# Patient Record
Sex: Female | Born: 1950 | Hispanic: No | Smoking: Never smoker
Health system: Southern US, Community
[De-identification: ages and names within clinical notes are randomized; demographics above are authoritative.]

## PROBLEM LIST (undated history)

## (undated) DIAGNOSIS — F329 Major depressive disorder, single episode, unspecified: Principal | ICD-10-CM

## (undated) DIAGNOSIS — Z923 Personal history of irradiation: Secondary | ICD-10-CM

## (undated) DIAGNOSIS — C719 Malignant neoplasm of brain, unspecified: Secondary | ICD-10-CM

## (undated) DIAGNOSIS — F419 Anxiety disorder, unspecified: Principal | ICD-10-CM

## (undated) DIAGNOSIS — R519 Headache, unspecified: Secondary | ICD-10-CM

## (undated) DIAGNOSIS — R011 Cardiac murmur, unspecified: Secondary | ICD-10-CM

## (undated) DIAGNOSIS — K219 Gastro-esophageal reflux disease without esophagitis: Secondary | ICD-10-CM

## (undated) DIAGNOSIS — J302 Other seasonal allergic rhinitis: Secondary | ICD-10-CM

## (undated) DIAGNOSIS — G609 Hereditary and idiopathic neuropathy, unspecified: Principal | ICD-10-CM

## (undated) DIAGNOSIS — M1990 Unspecified osteoarthritis, unspecified site: Secondary | ICD-10-CM

## (undated) DIAGNOSIS — R51 Headache: Secondary | ICD-10-CM

## (undated) DIAGNOSIS — D649 Anemia, unspecified: Secondary | ICD-10-CM

## (undated) DIAGNOSIS — F32A Depression, unspecified: Secondary | ICD-10-CM

## (undated) DIAGNOSIS — C50919 Malignant neoplasm of unspecified site of unspecified female breast: Secondary | ICD-10-CM

## (undated) HISTORY — PX: CATARACT EXTRACTION: SUR2

## (undated) HISTORY — DX: Hereditary and idiopathic neuropathy, unspecified: G60.9

## (undated) HISTORY — PX: ABDOMINAL HYSTERECTOMY: SHX81

## (undated) HISTORY — PX: BREAST SURGERY: SHX581

## (undated) HISTORY — DX: Anxiety disorder, unspecified: F41.9

## (undated) HISTORY — DX: Malignant neoplasm of brain, unspecified: C71.9

## (undated) HISTORY — DX: Anemia, unspecified: D64.9

## (undated) HISTORY — PX: BREAST EXCISIONAL BIOPSY: SUR124

## (undated) HISTORY — DX: Major depressive disorder, single episode, unspecified: F32.9

---
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
## 2000-06-14 ENCOUNTER — Encounter: Payer: Self-pay | Admitting: Ophthalmology

## 2000-06-15 ENCOUNTER — Ambulatory Visit (HOSPITAL_COMMUNITY): Admission: RE | Admit: 2000-06-15 | Discharge: 2000-06-15 | Payer: Self-pay | Admitting: Ophthalmology

## 2000-08-28 ENCOUNTER — Ambulatory Visit (HOSPITAL_COMMUNITY): Admission: RE | Admit: 2000-08-28 | Discharge: 2000-08-28 | Payer: Self-pay | Admitting: Ophthalmology

## 2005-10-24 HISTORY — PX: BREAST LUMPECTOMY: SHX2

## 2006-02-15 ENCOUNTER — Encounter (INDEPENDENT_AMBULATORY_CARE_PROVIDER_SITE_OTHER): Payer: Self-pay | Admitting: Specialist

## 2006-02-15 ENCOUNTER — Encounter: Admission: RE | Admit: 2006-02-15 | Discharge: 2006-02-15 | Payer: Self-pay | Admitting: Family Medicine

## 2006-06-14 ENCOUNTER — Encounter: Admission: RE | Admit: 2006-06-14 | Discharge: 2006-06-14 | Payer: Self-pay | Admitting: *Deleted

## 2006-06-14 ENCOUNTER — Ambulatory Visit (HOSPITAL_COMMUNITY): Admission: RE | Admit: 2006-06-14 | Discharge: 2006-06-14 | Payer: Self-pay | Admitting: *Deleted

## 2006-06-14 ENCOUNTER — Encounter (INDEPENDENT_AMBULATORY_CARE_PROVIDER_SITE_OTHER): Payer: Self-pay | Admitting: Specialist

## 2007-03-05 ENCOUNTER — Encounter: Admission: RE | Admit: 2007-03-05 | Discharge: 2007-03-05 | Payer: Self-pay | Admitting: *Deleted

## 2007-03-29 ENCOUNTER — Emergency Department (HOSPITAL_COMMUNITY): Admission: EM | Admit: 2007-03-29 | Discharge: 2007-03-29 | Payer: Self-pay | Admitting: Emergency Medicine

## 2007-03-30 ENCOUNTER — Emergency Department (HOSPITAL_COMMUNITY): Admission: EM | Admit: 2007-03-30 | Discharge: 2007-03-30 | Payer: Self-pay | Admitting: Emergency Medicine

## 2007-04-01 ENCOUNTER — Emergency Department (HOSPITAL_COMMUNITY): Admission: EM | Admit: 2007-04-01 | Discharge: 2007-04-01 | Payer: Self-pay | Admitting: Emergency Medicine

## 2008-03-27 ENCOUNTER — Encounter: Admission: RE | Admit: 2008-03-27 | Discharge: 2008-03-27 | Payer: Self-pay | Admitting: *Deleted

## 2009-04-16 ENCOUNTER — Encounter: Admission: RE | Admit: 2009-04-16 | Discharge: 2009-04-16 | Payer: Self-pay | Admitting: *Deleted

## 2010-11-14 ENCOUNTER — Encounter: Payer: Self-pay | Admitting: *Deleted

## 2010-11-14 ENCOUNTER — Encounter: Payer: Self-pay | Admitting: Family Medicine

## 2010-11-15 ENCOUNTER — Encounter: Payer: Self-pay | Admitting: *Deleted

## 2011-03-11 NOTE — Op Note (Signed)
Amy Roman, Amy Roman               ACCOUNT NO.:  [id number]   MEDICAL RECORD NO.:  [id number]          PATIENT TYPE:  AMB   LOCATION:  SDS                          FACILITY:  [facility]   PHYSICIAN:  Alfonse Ras, MD   DATE OF BIRTH:  [birth year]   DATE OF PROCEDURE:  06/14/2006  DATE OF DISCHARGE:  06/14/2006                                 OPERATIVE REPORT   PREOPERATIVE DIAGNOSIS:  1. Abnormal right mammogram.  2. History of papilloma.   POSTOPERATIVE DIAGNOSIS:  1. Abnormal right mammogram.  2. History of papilloma.   PROCEDURE:  Needle localization right breast biopsy.   SURGEON:  Alfonse Ras, MD   ANESTHESIA:  Local MAC.   DESCRIPTION:  The patient was taken to the operating room and placed in  supine position after adequate MAC anesthesia was induced.  Using IV  sedation, the patient refused to remove her pants prior to sedation.  She  was blocked then with some towels to protect her from her zipper.  The area  in the right upper outer quadrant of the breast was anesthetized using 1%  lidocaine local anesthesia.  I made a curvilinear incision around the wire,  and dissected down, excising all tissue around the wire and had obvious  papillomatous tissue excised.   The patient had significant amount of bleeding in the cavity which was  controlled with Bovie electrocautery and Surgicel.  Skin was closed with  subcuticular 4-0 Monocryl.  Steri-Strips and sterile dressings were applied.  The patient tolerated the procedure well and went to PACU in good condition.  I am awaiting a call back on the specimen mammography.      Alfonse Ras, MD  Electronically Signed     KRE/MEDQ  D:  06/14/2006  T:  06/14/2006  Job:  [id number]

## 2014-05-14 ENCOUNTER — Encounter: Payer: Self-pay | Admitting: Internal Medicine

## 2014-05-14 ENCOUNTER — Ambulatory Visit (INDEPENDENT_AMBULATORY_CARE_PROVIDER_SITE_OTHER): Payer: Medicaid Other | Admitting: Internal Medicine

## 2014-05-14 VITALS — BP 122/72 | HR 74 | Temp 98.3°F | Wt 219.0 lb

## 2014-05-14 DIAGNOSIS — G609 Hereditary and idiopathic neuropathy, unspecified: Secondary | ICD-10-CM

## 2014-05-14 DIAGNOSIS — E559 Vitamin D deficiency, unspecified: Secondary | ICD-10-CM

## 2014-05-14 HISTORY — DX: Hereditary and idiopathic neuropathy, unspecified: G60.9

## 2014-05-14 MED ORDER — GABAPENTIN 300 MG PO CAPS: ORAL_CAPSULE | ORAL | Status: DC

## 2014-05-14 NOTE — Patient Instructions (Signed)
Get your blood work before you leave   Start taking a medication called gabapentin 300 mg: Take one tablet at bedtime for one week Then take one tablet twice a day for one week Then  take one tablet 3 times a day    Next visit is for routine check up in 3 weeks

## 2014-05-14 NOTE — Progress Notes (Signed)
Subjective:    Patient ID: Amy Roman, female    DOB: 1951-08-23, 63 y.o.   MRN: [id number]  DOS:  05/14/2014 Type of visit - description:  New pt, here w/ her son History: Several years history of leg pain, approximately since 2006: The pain is described as a burning, numbness, a spasm from the knees down bilaterally to the toes. OTC ibuprofen and Tylenol doesn't help. Apparently, no previous workup has been done. Because of the pain, she has been unable to sleep and feels somehow depressed. Also complained of back and bilateral hip pain mostly when she sits for prolonged periods of time or when she walks more than 1 block.   ROS Denies fever chills, has headaches which are at baseline. Denies chest pain, difficulty breathing. No edema but the "my legs got big back in 2006". She admits to poor vision, the problem is developing gradually since   after had cataract  surgery few years ago. No nausea, vomiting, diarrhea.     Past Medical History  Diagnosis Date  . Unspecified hereditary and idiopathic peripheral neuropathy 05/14/2014    Past Surgical History  Procedure Laterality Date  . Cataract extraction Bilateral   . Breast surgery      bx, no cancer     History   Social History  . Marital Status: Married    Spouse Name: N/A    Number of Children: 4  . Years of Education: N/A   Occupational History  . house     Social History Main Topics  . Smoking status: Never Smoker   . Smokeless tobacco: Never Used  . Alcohol Use: No  . Drug Use: Not on file  . Sexual Activity: Not on file   Other Topics Concern  . Not on file   Social History Narrative   Original from [place]   Moved to the [place] 2000   Widow      Family History  Problem Relation Age of Onset  . Colon cancer Neg Hx   . Breast cancer Neg Hx   . Diabetes Mother        Medication List       This list is accurate as of: 05/14/14 11:59 PM.  Always use your most recent med list.               ACETAMINOPHEN-CODEINE PO  Take by mouth.     gabapentin 300 MG capsule  Commonly known as:  NEURONTIN  Take  one tablet at bedtime for one week, then one tablet twice a day for one week, then one tablet 3 times a day     MOTRIN PO  Take by mouth.           Objective:   Physical Exam BP 122/72  Pulse 74  Temp(Src) 98.3 F (36.8 C)  Wt 219 lb (99.338 kg)  SpO2 98% General -- alert, well-developed, NAD.  Neck --no thyromegaly , normal carotid pulse   Lungs -- normal respiratory effort, no intercostal retractions, no accessory muscle use, and normal breath sounds.  Heart-- normal rate, regular rhythm, no murmur.  Abdomen-- Not distended, good bowel sounds,soft, non-tender. Extremities-- legs are large but no pitting or firm edema, some hyperpigmentation at pretibial skin, no redness Normal pedal and femoral pulses  . Knees w/o redness or effusion (although exam is limited d/t pt habitus) Neurologic--  alert & oriented X3. Speech normal, gait appropriate for age, strength symmetric and appropriate for age.  DTRs  decreased but symmetric.  Psych-- Cognition and judgment appear intact. Cooperative with normal attention span and concentration. No anxious or depressed appearing.        Assessment & Plan:

## 2014-05-14 NOTE — Assessment & Plan Note (Addendum)
8 years history of leg pain, symptoms consistent with a neuropathy. Other considerations are neurological claudication, Venous insufficiency.  Vascular insuf  is less likely as she has a  normal vascular exam. At this point I like to clarify the etiology of her symptoms, several labs will be ordered, see orders. We'll start Neurontin gradually for symptomatic relief. Will refer to neurology, likely will need a nerve conduction study.

## 2014-05-14 NOTE — Progress Notes (Signed)
Pre visit review using our clinic review tool, if applicable. No additional management support is needed unless otherwise documented below in the visit note. 

## 2014-05-15 LAB — TSH: TSH: 3.19 u[IU]/mL (ref 0.35–4.50)

## 2014-05-15 LAB — COMPREHENSIVE METABOLIC PANEL
ALK PHOS: 86 U/L (ref 39–117)
ALT: 18 U/L (ref 0–35)
AST: 24 U/L (ref 0–37)
Albumin: 3.7 g/dL (ref 3.5–5.2)
BILIRUBIN TOTAL: 0.5 mg/dL (ref 0.2–1.2)
BUN: 16 mg/dL (ref 6–23)
CO2: 25 meq/L (ref 19–32)
CREATININE: 0.8 mg/dL (ref 0.4–1.2)
Calcium: 9.2 mg/dL (ref 8.4–10.5)
Chloride: 107 mEq/L (ref 96–112)
GFR: 72.75 mL/min (ref >60.00)
Glucose, Bld: 128 mg/dL — ABNORMAL HIGH (ref 70–99)
Potassium: 3.8 mEq/L (ref 3.5–5.1)
SODIUM: 137 meq/L (ref 135–145)
TOTAL PROTEIN: 7 g/dL (ref 6.0–8.3)

## 2014-05-15 LAB — CBC WITH DIFFERENTIAL/PLATELET
BASOS ABS: 0 10*3/uL (ref 0.0–0.1)
Basophils Relative: 0.8 % (ref 0.0–3.0)
Eosinophils Absolute: 0.1 10*3/uL (ref 0.0–0.7)
Eosinophils Relative: 3 % (ref 0.0–5.0)
HEMATOCRIT: 39.8 % (ref 36.0–46.0)
HEMOGLOBIN: 13.4 g/dL (ref 12.0–15.0)
LYMPHS ABS: 1.6 10*3/uL (ref 0.7–4.0)
Lymphocytes Relative: 32.1 % (ref 12.0–46.0)
MCHC: 33.8 g/dL (ref 30.0–36.0)
MCV: 91.1 fl (ref 78.0–100.0)
MONO ABS: 0.3 10*3/uL (ref 0.1–1.0)
MONOS PCT: 6.2 % (ref 3.0–12.0)
NEUTROS ABS: 2.8 10*3/uL (ref 1.4–7.7)
Neutrophils Relative %: 57.9 % (ref 43.0–77.0)
PLATELETS: 282 10*3/uL (ref 150.0–400.0)
RBC: 4.36 Mil/uL (ref 3.87–5.11)
RDW: 13.6 % (ref 11.5–15.5)
WBC: 4.9 10*3/uL (ref 4.0–10.5)

## 2014-05-15 LAB — CK: CK TOTAL: 89 U/L (ref 7–177)

## 2014-05-15 LAB — VITAMIN B12: Vitamin B-12: 196 pg/mL — ABNORMAL LOW (ref 211–911)

## 2014-05-15 LAB — FERRITIN: Ferritin: 45.5 ng/mL (ref 10.0–291.0)

## 2014-05-15 LAB — IRON: IRON: 81 ug/dL (ref 42–145)

## 2014-05-15 LAB — HEMOGLOBIN A1C: Hgb A1c MFr Bld: 5.9 % (ref 4.6–6.5)

## 2014-05-15 LAB — FOLATE: FOLATE: 15.5 ng/mL (ref >5.9)

## 2014-05-15 LAB — VITAMIN D 25 HYDROXY (VIT D DEFICIENCY, FRACTURES): VITD: 12.17 ng/mL — AB (ref 30.00–100.00)

## 2014-05-20 MED ORDER — VITAMIN D (ERGOCALCIFEROL) 1.25 MG (50000 UNIT) PO CAPS: 50000.0000 [IU] | ORAL_CAPSULE | ORAL | Status: DC

## 2014-05-20 NOTE — Addendum Note (Signed)
Addended by: Peggyann Shoals on: 05/20/2014 08:49 AM   Modules accepted: Orders

## 2014-05-27 ENCOUNTER — Ambulatory Visit (INDEPENDENT_AMBULATORY_CARE_PROVIDER_SITE_OTHER): Payer: Medicaid Other | Admitting: *Deleted

## 2014-05-27 DIAGNOSIS — E559 Vitamin D deficiency, unspecified: Secondary | ICD-10-CM

## 2014-05-27 DIAGNOSIS — E538 Deficiency of other specified B group vitamins: Secondary | ICD-10-CM

## 2014-05-27 MED ORDER — CYANOCOBALAMIN 1000 MCG/ML IJ SOLN
1000.0000 ug | Freq: Once | INTRAMUSCULAR | Status: AC
  Administered 2014-05-27: 1000 ug via INTRAMUSCULAR

## 2014-05-27 NOTE — Progress Notes (Signed)
Pt came in for her 1st weekly Vit B12 injection.  Pt tolerated injection well.  Next injection in 1 week, and pt will schedule an appt.//AB/CMA

## 2014-06-02 ENCOUNTER — Ambulatory Visit (INDEPENDENT_AMBULATORY_CARE_PROVIDER_SITE_OTHER): Payer: Medicaid Other | Admitting: Internal Medicine

## 2014-06-02 ENCOUNTER — Encounter: Payer: Self-pay | Admitting: Internal Medicine

## 2014-06-02 VITALS — BP 116/76 | HR 71 | Temp 98.2°F | Wt 214.0 lb

## 2014-06-02 DIAGNOSIS — E559 Vitamin D deficiency, unspecified: Secondary | ICD-10-CM

## 2014-06-02 DIAGNOSIS — E538 Deficiency of other specified B group vitamins: Secondary | ICD-10-CM

## 2014-06-02 DIAGNOSIS — G609 Hereditary and idiopathic neuropathy, unspecified: Secondary | ICD-10-CM

## 2014-06-02 MED ORDER — VITAMIN D (ERGOCALCIFEROL) 1.25 MG (50000 UNIT) PO CAPS: 50000.0000 [IU] | ORAL_CAPSULE | ORAL | Status: DC

## 2014-06-02 MED ORDER — CYANOCOBALAMIN 1000 MCG/ML IJ SOLN
1000.0000 ug | Freq: Once | INTRAMUSCULAR | Status: AC
  Administered 2014-06-02: 1000 ug via INTRAMUSCULAR

## 2014-06-02 NOTE — Assessment & Plan Note (Signed)
Labs show a low B12 vitamin D. Plan: See neurology as recommended Replace vitamins Declined to take more neurontin than 300 mg twice a day. Reassess in 6 months

## 2014-06-02 NOTE — Assessment & Plan Note (Signed)
On ergocalciferol

## 2014-06-02 NOTE — Progress Notes (Signed)
   Subjective:    Patient ID: Amy Roman, female    DOB: [birth year], 63 y.o.   MRN: [id number]  DOS:  06/02/2014 Type of visit - description: Followup from previous visit History: Was diagnosed with neuropathy, labs show low vitamin B12 and vitamin D. Status post a B12 shot, due for another shot today. she misunderstood the instructions for ergocalciferol and took one one tablet daily for 4 days. She is taking gabapentin, one tablet twice a day, afraid to take more because she thinks may get drowsy, symptoms have improved to some extent.  ROS Denies nausea, vomiting, excessive somnolence. No headaches.   Past Medical History  Diagnosis Date  . Unspecified hereditary and idiopathic peripheral neuropathy 05/14/2014    Past Surgical History  Procedure Laterality Date  . Cataract extraction Bilateral   . Breast surgery      bx, no cancer     History   Social History  . Marital Status: Married    Spouse Name: N/A    Number of Children: 4  . Years of Education: N/A   Occupational History  . house     Social History Main Topics  . Smoking status: Never Smoker   . Smokeless tobacco: Never Used  . Alcohol Use: No  . Drug Use: Not on file  . Sexual Activity: Not on file   Other Topics Concern  . Not on file   Social History Narrative   Original from [place]   Moved to the [place] 2000   Widow         Medication List       This list is accurate as of: 06/02/14  5:07 PM.  Always use your most recent med list.               gabapentin 300 MG capsule  Commonly known as:  NEURONTIN  Take 300 mg by mouth 2 (two) times daily.     MOTRIN PO  Take by mouth.     Vitamin D (Ergocalciferol) 50000 UNITS Caps capsule  Commonly known as:  DRISDOL  Take 1 capsule (50,000 Units total) by mouth every 7 (seven) days.           Objective:   Physical Exam BP 116/76  Pulse 71  Temp(Src) 98.2 F (36.8 C)  Wt 214 lb (97.07 kg)  SpO2 98% General -- alert,  well-developed, NAD.  Neurologic--  alert & oriented X3. Speech normal, gait appropriate for age, strength symmetric and appropriate for age.  Psych-- Cognition and judgment appear intact. Cooperative with normal attention span and concentration. No anxious or depressed appearing.     Assessment & Plan:

## 2014-06-02 NOTE — Patient Instructions (Signed)
Take gabapentin one tablet twice a day  Take ergocalciferol 50,000 units one tablet  once a week only.  Come back in one week for your next vitamin B12 shot  See the specialist as recommended  Next visit in 3 months

## 2014-06-02 NOTE — Progress Notes (Signed)
Pre visit review using our clinic review tool, if applicable. No additional management support is needed unless otherwise documented below in the visit note. 

## 2014-06-02 NOTE — Assessment & Plan Note (Addendum)
To get her second shot today, to come back for additional shots as recommended

## 2014-06-10 ENCOUNTER — Ambulatory Visit (INDEPENDENT_AMBULATORY_CARE_PROVIDER_SITE_OTHER): Payer: Medicaid Other | Admitting: General Practice

## 2014-06-10 VITALS — Temp 98.0°F

## 2014-06-10 DIAGNOSIS — E538 Deficiency of other specified B group vitamins: Secondary | ICD-10-CM

## 2014-06-10 MED ORDER — CYANOCOBALAMIN 1000 MCG/ML IJ SOLN
1000.0000 ug | Freq: Once | INTRAMUSCULAR | Status: AC
  Administered 2014-06-10: 1000 ug via INTRAMUSCULAR

## 2014-06-17 ENCOUNTER — Ambulatory Visit (INDEPENDENT_AMBULATORY_CARE_PROVIDER_SITE_OTHER): Payer: Medicaid Other | Admitting: *Deleted

## 2014-06-17 DIAGNOSIS — E538 Deficiency of other specified B group vitamins: Secondary | ICD-10-CM

## 2014-06-17 MED ORDER — CYANOCOBALAMIN 1000 MCG/ML IJ SOLN
1000.0000 ug | Freq: Once | INTRAMUSCULAR | Status: AC
  Administered 2014-06-17: 1000 ug via INTRAMUSCULAR

## 2014-07-01 ENCOUNTER — Ambulatory Visit (INDEPENDENT_AMBULATORY_CARE_PROVIDER_SITE_OTHER): Payer: Medicaid Other | Admitting: Neurology

## 2014-07-01 ENCOUNTER — Encounter: Payer: Self-pay | Admitting: Neurology

## 2014-07-01 VITALS — BP 110/74 | HR 72 | Ht 61.02 in | Wt 215.5 lb

## 2014-07-01 DIAGNOSIS — IMO0002 Reserved for concepts with insufficient information to code with codable children: Secondary | ICD-10-CM

## 2014-07-01 DIAGNOSIS — G4762 Sleep related leg cramps: Secondary | ICD-10-CM

## 2014-07-01 DIAGNOSIS — M5416 Radiculopathy, lumbar region: Secondary | ICD-10-CM

## 2014-07-01 DIAGNOSIS — E538 Deficiency of other specified B group vitamins: Secondary | ICD-10-CM

## 2014-07-01 DIAGNOSIS — G609 Hereditary and idiopathic neuropathy, unspecified: Secondary | ICD-10-CM

## 2014-07-01 MED ORDER — PREGABALIN 75 MG PO CAPS: 75.0000 mg | ORAL_CAPSULE | Freq: Two times a day (BID) | ORAL | Status: DC

## 2014-07-01 NOTE — Progress Notes (Signed)
[facility] Clinic Note - Initial Visit   Date: 07/01/2014  Amy Roman MRN: [id number] DOB: 1951/01/10   Dear Dr. Larose Kells:   Thank you for your kind referral of Amy Roman for consultation of bilateral feet paresthesias. Although her history is well known to you, please allow [place] to reiterate it for the purpose of our medical record. The patient was accompanied to the clinic by son who also provides collateral information.     History of Present Illness: Amy Roman is a 63 y.o. right-handed [place] female with history of benign right breast mass resection, vitamin B12 deficiency, lymphedema, and bilateral cataract presenting for evaluation of bilateral feet pain.    Starting around 2006, she developed numbness and burning pain especially from the knees down.  She tried using an ibuprofen ointment which helped.  Pain is constant and worse with prolonged standing or walking for greater than 10-15 min.  She has a lot of low back pain with radiating pain into the left leg. She was recently started on gabapentin 300mg  TID which improved pain slightly, but did not completely alleviate.  Although it was recommended to further titrate, patient reluctant to increase the dose and requesting trial of Lyrica instead, reporting that she is very sensitive to medication but denies any specific side effects related to gabapentin.  Of note, part of her neuropathy work-up did show vitamin B12 and vitamin D deficiency for which she is being supplemented.  Out-side paper records, electronic medical record, and images have been reviewed where available and summarized as:  Labs 05/14/2014:  Vitamin B12 196*, HbA1c 5.9, TSH 3.19, ferritin 45, CK 89, vitamin D 14* .   Past Medical History  Diagnosis Date  . Unspecified hereditary and idiopathic peripheral neuropathy 05/14/2014    Past Surgical History  Procedure Laterality Date  . Cataract extraction  Bilateral   . Breast surgery      bx, no cancer      Medications:  Current Outpatient Prescriptions on File Prior to Visit  Medication Sig Dispense Refill  . gabapentin (NEURONTIN) 300 MG capsule Take 300 mg by mouth 2 (two) times daily.      . Ibuprofen (MOTRIN PO) Take by mouth.      . Vitamin D, Ergocalciferol, (DRISDOL) 50000 UNITS CAPS capsule Take 1 capsule (50,000 Units total) by mouth every 7 (seven) days.  4 capsule  3   No current facility-administered medications on file prior to visit.    Allergies: No Known Allergies  Family History: Family History  Problem Relation Age of Onset  . Colon cancer Neg Hx   . Breast cancer Neg Hx   . Diabetes Mother   . Neuropathy Sister   . Neuropathy Mother   . Other Father     Social History: History   Social History  . Marital Status: Married    Spouse Name: N/A    Number of Children: 4  . Years of Education: N/A   Occupational History  . house     Social History Main Topics  . Smoking status: Never Smoker   . Smokeless tobacco: Never Used  . Alcohol Use: No  . Drug Use: No  . Sexual Activity: Not on file   Other Topics Concern  . Not on file   Social History Narrative   Original from [place]   Moved to the [place] 2000   Widow    She lives with son.  She had four sons.  Review of Systems:  CONSTITUTIONAL: No fevers, chills, night sweats, or weight loss.   EYES: No visual changes or eye pain ENT: No hearing changes.  No history of nose bleeds.   RESPIRATORY: No cough, wheezing and shortness of breath.   CARDIOVASCULAR: Negative for chest pain, and palpitations.   GI: Negative for abdominal discomfort, blood in stools or black stools.  No recent change in bowel habits.   GU:  No history of incontinence.   MUSCLOSKELETAL: +history of joint pain or swelling.  No myalgias.   SKIN: Negative for lesions, rash, and itching.   HEMATOLOGY/ONCOLOGY: Negative for prolonged bleeding, bruising easily,  and swollen nodes.  No history of cancer.   ENDOCRINE: Negative for cold or heat intolerance, polydipsia or goiter.   PSYCH:  +depression or anxiety symptoms.   NEURO: As Above.   Vital Signs:  BP 110/74  Pulse 72  Ht 5' 1.02" (1.55 m)  Wt 215 lb 8 oz (97.75 kg)  BMI 40.69 kg/m2  SpO2 95%  General Medical Exam:   General:  Well appearing, comfortable.   Eyes/ENT: see cranial nerve examination.   Neck: No masses appreciated.  Full range of motion without tenderness.  No carotid bruits. Respiratory:  Clear to auscultation, good air entry bilaterally.   Cardiac:  Regular rate and rhythm, no murmur.    Extremities:  Marked lymphedema of bilateral legs Skin:  Skin color, texture, turgor normal. No rashes or lesions.  Neurological Exam: MENTAL STATUS including orientation to time, place, person, recent and remote memory, attention span and concentration, language, and fund of knowledge is normal.  Speech is not dysarthric.  CRANIAL NERVES: II:  No visual field defects.  Unremarkable fundi.   III-IV-VI: Pupils equal round and reactive to light.  Normal conjugate, extra-ocular eye movements in all directions of gaze.  No nystagmus.  No ptosis.   V:  Normal facial sensation.  VII:  Normal facial symmetry and movements.   VIII:  Normal hearing and vestibular function.   IX-X:  Normal palatal movement.   XI:  Normal shoulder shrug and head rotation.   XII:  Normal tongue strength and range of motion, no deviation or fasciculation.  MOTOR:  Prominent lower leg edema bilaterally.  No atrophy, fasciculations or abnormal movements.  No pronator drift.  Tone is normal.    Right Upper Extremity:    Left Upper Extremity:    Deltoid  5/5   Deltoid  5/5   Biceps  5/5   Biceps  5/5   Triceps  5/5   Triceps  5/5   Wrist extensors  5/5   Wrist extensors  5/5   Wrist flexors  5/5   Wrist flexors  5/5   Finger extensors  5/5   Finger extensors  5/5   Finger flexors  5/5   Finger flexors  5/5     Dorsal interossei  5/5   Dorsal interossei  5/5   Abductor pollicis  5/5   Abductor pollicis  5/5   Tone (Ashworth scale)  0  Tone (Ashworth scale)  0   Right Lower Extremity:    Left Lower Extremity:    Hip flexors  5/5   Hip flexors  5/5   Hip extensors  5/5   Hip extensors  5/5   Knee flexors  5/5   Knee flexors  5/5   Knee extensors  5/5   Knee extensors  5/5   Dorsiflexors  5/5   Dorsiflexors  5/5  Plantarflexors  5/5   Plantarflexors  5/5   Toe extensors  5/5   Toe extensors  5/5   Toe flexors  5/5   Toe flexors  5/5   Tone (Ashworth scale)  0  Tone (Ashworth scale)  0   MSRs:  Right                                                                 Left brachioradialis 2+  brachioradialis 2+  biceps 2+  biceps 2+  triceps 2+  triceps 2+  patellar 1+  patellar 1+  ankle jerk 0  ankle jerk 0  Hoffman no  Hoffman no  plantar response down  plantar response down   SENSORY:  Pin prick is reduced in the legs bilaterally.  Vibration and light touch intact throughout. Romberg's sign absent.   COORDINATION/GAIT: Normal finger-to- nose-finger.  Intact rapid alternating movements bilaterally.  Able to rise from a chair without using arms.  Gait wide-based due to body habitus, stable.    IMPRESSION: 1.  Peripheral neuropathy with predominant small fiber involvement.  Neuropathy labs notable for vitamin B12 deficiency  2.  Lumbar spondylosis with radiculopathy also likely based on history 3.  Significant bilateral leg lymphedema would hamper accurate testing of NCS/EMG, so will hold off for now.  If no improvement, consider imaging of the back to look for foraminal stenosis which may be contributing to radicular symptoms 4.  Vitamin B12 deficiency   PLAN/RECOMMENDATIONS:  1.  Start lyrica 75mg  twice daily, uptitrate as needed 2.  Stop taking gabapentin, as patient does not wish to further titrate, despite no adverse effects 3.  Continue monthly vitamin B12 injections 4.  Consider  adding muscle relaxant going forward for noctural leg cramps 5.  Return to clinic in 82-months   The duration of this appointment visit was 40 minutes of face-to-face time with the patient.  Greater than 50% of this time was spent in counseling, explanation of diagnosis, planning of further management, and coordination of care.   Thank you for allowing me to participate in patient's care.  If I can answer any additional questions, I would be pleased to do so.    Sincerely,    Donika K. Posey Pronto, DO

## 2014-07-01 NOTE — Patient Instructions (Addendum)
1.  Start Lyrica 75mg  twice daily 2.  Stop taking gabapentin 3.  Continue vitamin B12 injections 4.  Return to clinic as needed

## 2014-07-02 ENCOUNTER — Telehealth: Payer: Self-pay | Admitting: *Deleted

## 2014-07-02 ENCOUNTER — Other Ambulatory Visit: Payer: Self-pay | Admitting: *Deleted

## 2014-07-02 MED ORDER — NORTRIPTYLINE HCL 10 MG PO CAPS
10.0000 mg | ORAL_CAPSULE | Freq: Every day | ORAL | Status: DC
Start: 2014-07-02 — End: 2014-10-15

## 2014-07-02 NOTE — Telephone Encounter (Signed)
Called son and informed him that insurance will not cover the Lyrica until patient has tried 2 other medications prior.  He agreed to have mother try Nortriptyline 10 mg qhs.  He requested 2 month supply.  Rx sent.

## 2014-07-25 ENCOUNTER — Ambulatory Visit: Payer: Medicaid Other | Admitting: Internal Medicine

## 2014-10-15 ENCOUNTER — Ambulatory Visit (INDEPENDENT_AMBULATORY_CARE_PROVIDER_SITE_OTHER): Payer: Medicaid Other | Admitting: Internal Medicine

## 2014-10-15 ENCOUNTER — Encounter: Payer: Self-pay | Admitting: Internal Medicine

## 2014-10-15 VITALS — BP 124/62 | HR 73 | Temp 97.3°F | Wt 209.4 lb

## 2014-10-15 DIAGNOSIS — G609 Hereditary and idiopathic neuropathy, unspecified: Secondary | ICD-10-CM

## 2014-10-15 DIAGNOSIS — E559 Vitamin D deficiency, unspecified: Secondary | ICD-10-CM

## 2014-10-15 DIAGNOSIS — E538 Deficiency of other specified B group vitamins: Secondary | ICD-10-CM

## 2014-10-15 DIAGNOSIS — F329 Major depressive disorder, single episode, unspecified: Secondary | ICD-10-CM

## 2014-10-15 DIAGNOSIS — F32A Depression, unspecified: Secondary | ICD-10-CM

## 2014-10-15 DIAGNOSIS — F418 Other specified anxiety disorders: Secondary | ICD-10-CM

## 2014-10-15 DIAGNOSIS — F419 Anxiety disorder, unspecified: Secondary | ICD-10-CM

## 2014-10-15 HISTORY — DX: Anxiety disorder, unspecified: F41.9

## 2014-10-15 HISTORY — DX: Depression, unspecified: F32.A

## 2014-10-15 MED ORDER — GABAPENTIN 300 MG PO CAPS: 300.0000 mg | ORAL_CAPSULE | Freq: Three times a day (TID) | ORAL | Status: DC

## 2014-10-15 MED ORDER — ESCITALOPRAM OXALATE 10 MG PO TABS: 10.0000 mg | ORAL_TABLET | Freq: Every day | ORAL | Status: DC

## 2014-10-15 MED ORDER — CYANOCOBALAMIN 1000 MCG/ML IJ SOLN
1000.0000 ug | Freq: Once | INTRAMUSCULAR | Status: AC
  Administered 2014-10-15: 1000 ug via INTRAMUSCULAR

## 2014-10-15 NOTE — Progress Notes (Signed)
Pre visit review using our clinic review tool, if applicable. No additional management support is needed unless otherwise documented below in the visit note. 

## 2014-10-15 NOTE — Progress Notes (Signed)
   Subjective:    Patient ID: Amy Roman, female    DOB: 1950/11/19, 63 y.o.   MRN: [id number]  DOS:  10/15/2014 Type of visit - description : f/u Interval history: Neuropathy, saw neurology, note reviewed, see assessment and plan B12 deficiency, got 2 B12 shots, then [place] and could not keep getting the injections. Vitamin D deficiency, status post weekly replacement. Not taking any supplements now Today her main concern is depression, states he is "severely depressed", a son has been missing for 2 years, 2 weeks ago she find out that he is in [facility] , he is homeless and   has not been able to contact him.   ROS Denies any suicidal ideas  Past Medical History  Diagnosis Date  . Unspecified hereditary and idiopathic peripheral neuropathy 05/14/2014  . Anxiety and depression 10/15/2014    Past Surgical History  Procedure Laterality Date  . Cataract extraction Bilateral   . Breast surgery      bx, no cancer     History   Social History  . Marital Status: Married    Spouse Name: N/A    Number of Children: 4  . Years of Education: N/A   Occupational History  . house     Social History Main Topics  . Smoking status: Never Smoker   . Smokeless tobacco: Never Used  . Alcohol Use: No  . Drug Use: No  . Sexual Activity: Not on file   Other Topics Concern  . Not on file   Social History Narrative   Original from [place]   Moved to the [place] 2000   Widow    She lives with son.  She had four sons.              Medication List       This list is accurate as of: 10/15/14 11:59 PM.  Always use your most recent med list.               escitalopram 10 MG tablet  Commonly known as:  LEXAPRO  Take 1 tablet (10 mg total) by mouth daily.     gabapentin 300 MG capsule  Commonly known as:  NEURONTIN  Take 1 capsule (300 mg total) by mouth 3 (three) times daily.     MOTRIN PO  Take by mouth.     VITAMIN B-12 IJ  Inject as  directed.           Objective:   Physical Exam BP 124/62 mmHg  Pulse 73  Temp(Src) 97.3 F (36.3 C) (Oral)  Wt 209 lb 6 oz (94.972 kg)  SpO2 99% General -- alert, well-developed, NAD.  Extremities--large lower extremities but no pitting edema    Neurologic--  alert & oriented X3. Speech normal, gait appropriate for age, strength symmetric and appropriate for age.  Psych-- Cognition and judgment appear intact. Cooperative with normal attention span and concentration.she is tearful, anxious (moderately)     Assessment & Plan:   Today , I spent more than  25  min with the patient: >50% of the time counseling regards depression, pros-cons of meds, s/e (including suicidality)

## 2014-10-15 NOTE — Assessment & Plan Note (Signed)
Status post ergocalciferol, recommend to take OTCs, recheck on return to the office

## 2014-10-15 NOTE — Assessment & Plan Note (Addendum)
Got  approximately two B12 injections, then she [facility] Plan: Injection today, repeating one week, then monthly

## 2014-10-15 NOTE — Assessment & Plan Note (Signed)
Status post evaluation by neurology, they agree with a diagnosis of neuropathy, she also has some lumbar spondylosis and lymphedema. They decided not to pursue a nerve conduction study due to the lymphedema. They suggested Lyrica but he was very expensive then suggested nortriptyline. The patient ended up not taking any of that medication and she is on Neurontin -up to 3 times a day- she is satisfied with the treatment consequently we won't  change. Refill meds.

## 2014-10-15 NOTE — Assessment & Plan Note (Signed)
New problem, related to family issues (see history of present illness). The patient is not suicidal, we talk about possible treatment with medication and she is agreeable. Plan: Lexapro, recommend to see one of our counselors, information provided. Return to the office 4 weeks. Warned about suicidal ideas

## 2014-10-15 NOTE — Patient Instructions (Signed)
Take gabapentin   one tablet 3 times a day  Start  a new medication Lexapro 1 tablet at bedtime If you get more depressed or you think about suicide, stop the medication and call us  Take OTC vitamin D supplements, between 1000 and 2000 units daily  Come back to the office in one week for another B12 injection  Next office visit with me in one month

## 2014-10-27 ENCOUNTER — Ambulatory Visit: Payer: Medicaid Other | Admitting: Internal Medicine

## 2014-11-18 ENCOUNTER — Ambulatory Visit: Payer: Medicaid Other | Admitting: Internal Medicine

## 2014-11-21 ENCOUNTER — Ambulatory Visit: Payer: Medicaid Other | Admitting: Internal Medicine

## 2014-11-21 ENCOUNTER — Telehealth: Payer: Self-pay | Admitting: *Deleted

## 2014-11-21 NOTE — Telephone Encounter (Signed)
Pt did not show for appointment 11/21/14 at 2:45pm for 1 month follow up.  Charge no show fee?

## 2014-11-24 ENCOUNTER — Encounter: Payer: Self-pay | Admitting: Internal Medicine

## 2014-11-24 NOTE — Telephone Encounter (Signed)
No is ok, send a letter ---->  rec to reschedule

## 2014-11-24 NOTE — Telephone Encounter (Signed)
See note below for no show fee.  No show letter mailed 11/24/14.

## 2015-02-17 ENCOUNTER — Telehealth: Payer: Self-pay | Admitting: Internal Medicine

## 2015-02-17 NOTE — Telephone Encounter (Signed)
Caller name: Bassy A. Relation to pt: self Call back number: [phone] or call son Pharmacy: [id number] - [place], [place]  Reason for call: Pt called requesting rx for gabapentin (NEURONTIN) 300 MG capsule, ASAP. Please advise.

## 2015-02-17 NOTE — Telephone Encounter (Signed)
Gabapentin refills were sent on 10/15/2014 for 6 months to [facility] as requested. Pt should have enough to get him until 04/16/2015. Pt will need to call pharmacy.

## 2015-02-24 ENCOUNTER — Other Ambulatory Visit: Payer: Self-pay

## 2015-02-25 ENCOUNTER — Ambulatory Visit: Payer: Medicaid Other | Admitting: Internal Medicine

## 2015-03-11 ENCOUNTER — Ambulatory Visit (INDEPENDENT_AMBULATORY_CARE_PROVIDER_SITE_OTHER): Payer: Medicaid Other | Admitting: Internal Medicine

## 2015-03-11 ENCOUNTER — Encounter: Payer: Self-pay | Admitting: Internal Medicine

## 2015-03-11 VITALS — BP 128/78 | HR 64 | Temp 97.6°F | Ht 61.0 in | Wt 212.5 lb

## 2015-03-11 DIAGNOSIS — F329 Major depressive disorder, single episode, unspecified: Secondary | ICD-10-CM

## 2015-03-11 DIAGNOSIS — F418 Other specified anxiety disorders: Secondary | ICD-10-CM | POA: Diagnosis not present

## 2015-03-11 DIAGNOSIS — E559 Vitamin D deficiency, unspecified: Secondary | ICD-10-CM

## 2015-03-11 DIAGNOSIS — Z0000 Encounter for general adult medical examination without abnormal findings: Secondary | ICD-10-CM

## 2015-03-11 DIAGNOSIS — R7309 Other abnormal glucose: Secondary | ICD-10-CM | POA: Diagnosis not present

## 2015-03-11 DIAGNOSIS — F419 Anxiety disorder, unspecified: Secondary | ICD-10-CM

## 2015-03-11 DIAGNOSIS — E538 Deficiency of other specified B group vitamins: Secondary | ICD-10-CM | POA: Diagnosis not present

## 2015-03-11 DIAGNOSIS — R7303 Prediabetes: Secondary | ICD-10-CM

## 2015-03-11 MED ORDER — ESCITALOPRAM OXALATE 10 MG PO TABS: 10.0000 mg | ORAL_TABLET | Freq: Every day | ORAL | Status: DC

## 2015-03-11 MED ORDER — GABAPENTIN 300 MG PO CAPS: 300.0000 mg | ORAL_CAPSULE | Freq: Three times a day (TID) | ORAL | Status: DC

## 2015-03-11 MED ORDER — CYANOCOBALAMIN 1000 MCG/ML IJ SOLN
1000.0000 ug | Freq: Once | INTRAMUSCULAR | Status: AC
  Administered 2015-03-11: 1000 ug via INTRAMUSCULAR

## 2015-03-11 NOTE — Progress Notes (Signed)
Subjective:    Patient ID: Amy Roman, female    DOB: 05/11/1951, 64 y.o.   MRN: [id number]  DOS:  03/11/2015 Type of visit - description : rov Interval history:  Patient is a 64 year old female with a history of anxiety/depression, vitamin B12 deficiency, and vitamin D deficiency in today for routine medical care.  Anxiety/Depression: Patient has been on Lexapro for the past five months, depression related to family issues. She denies any n/v, suicidal ideation, or vision changes. She does note difficulty sleeping but states that she has previously prescribed medication which helps with insomnia. She cannot recall the name of this medication.   Neuropathy: She is currently taking Gabapentin 3x/day for neuropathy and notes good relief with medication. Denies any side effects from medication. Currently no pain in legs, just mild occasional itching.  B12 deficiency: Patient has missed previous three injections. Notes no fatigue.   Osteoarthritis: Patient notes pain with ambulation in both knees and in lower back. Currently pain is well-managed with Tylenol prn.     Review of Systems  Constitutional: No fever or chills. Cardiovascular: No CP or palpitations GI: no nausea, vomiting Musculoskeletal: Lower back and bilateral knee pain Neurological: No headaches or double vision. Psychiatry: As per HPI  Past Medical History  Diagnosis Date  . Unspecified hereditary and idiopathic peripheral neuropathy 05/14/2014  . Anxiety and depression 10/15/2014    Past Surgical History  Procedure Laterality Date  . Cataract extraction Bilateral   . Breast surgery      bx, no cancer     History   Social History  . Marital Status: Married    Spouse Name: N/A  . Number of Children: 4  . Years of Education: N/A   Occupational History  . house     Social History Main Topics  . Smoking status: Never Smoker   . Smokeless tobacco: Never Used  . Alcohol Use: No  . Drug Use: No    . Sexual Activity: Not on file   Other Topics Concern  . Not on file   Social History Narrative   Original from [place], [place]   Moved to the [place] 2000   Widow    She lives with son.  She had four sons.           Family History  Problem Relation Age of Onset  . Colon cancer Neg Hx   . Breast cancer Neg Hx   . Diabetes Mother   . Neuropathy Sister   . Neuropathy Mother   . Other Father        Medication List       This list is accurate as of: 03/11/15  7:57 PM.  Always use your most recent med list.               escitalopram 10 MG tablet  Commonly known as:  LEXAPRO  Take 1 tablet (10 mg total) by mouth daily.     gabapentin 300 MG capsule  Commonly known as:  NEURONTIN  Take 1 capsule (300 mg total) by mouth 3 (three) times daily.     MOTRIN PO  Take by mouth.     VITAMIN B-12 IJ  Inject as directed.           Objective:   Physical Exam BP 128/78 mmHg  Pulse 64  Temp(Src) 97.6 F (36.4 C) (Oral)  Ht 5\' 1"  (1.549 m)  Wt 212 lb 8 oz (96.389 kg)  BMI 40.17  kg/m2  SpO2 95%  General:   Well developed, well nourished . NAD.  HEENT:  Normocephalic . Face symmetric, atraumatic Lungs:  CTA B Normal respiratory effort, no intercostal retractions, no accessory muscle use. Heart: RRR,  no murmur.  Skin: Not pale. Not jaundice Musculoskeletal: Bilateral nonpitting lower extremity edema Neurologic:  alert & oriented X3.  Speech normal, gait slow for age and unassisted Psych--  Cognition and judgment appear intact.  Cooperative with normal attention span and concentration.  Behavior appropriate. No anxious or depressed appearing.       Assessment & Plan:   (Patient seen along with   Aletta Edouard, medical student)  Depression Currently well-controlled with Lexapro 1 tablet daily, patient denies any suicidal ideation.  Plan: Continue Lexapro as presently prescribed.  Patient instructed to let us know if depression worsens.  Due to  insurance, patient will be switching providers to her Medicaid-appointed provider. Will call with the name of the insomnia medication if she ever needs a refill for possible RF  Neuropathy Currently well-controlled with Gabapentin. No changes to medications. Patient instructed to let us know if neuropathic pain worsens.  B12 deficiency:  Patient missed recent B12 injections, denies any fatigue.  Patient received B12 injection today in office. Will re-check B12 level.  Vitamin D: Will re-check Vitamin D level, patient instructed to continue OTC supplementation.  Osteoarthritis:  Pain currently managed with Tylenol prn. Continue current regimen.  Prediabetes: Previous A1c was 5.9. Will re-check A1c today to evaluate.    Gyn: Refer to gynecologist for pap smear , rx mammogram.

## 2015-03-11 NOTE — Progress Notes (Signed)
Pre visit review using our clinic review tool, if applicable. No additional management support is needed unless otherwise documented below in the visit note. 

## 2015-03-11 NOTE — Patient Instructions (Signed)
Get your blood work before you leave    Come back to the office in 6 months   for a physical exam (or see the Medicaid doctor assigned to you) Please schedule an appointment at the front desk    Come back fasting

## 2015-03-12 LAB — VITAMIN B12

## 2015-03-12 LAB — HEMOGLOBIN A1C: Hgb A1c MFr Bld: 5.6 % (ref 4.6–6.5)

## 2015-03-13 LAB — VITAMIN D 1,25 DIHYDROXY
VITAMIN D 1, 25 (OH) TOTAL: 75 pg/mL — AB (ref 18–72)
VITAMIN D3 1, 25 (OH): 31 pg/mL
Vitamin D2 1, 25 (OH)2: 44 pg/mL

## 2015-07-02 ENCOUNTER — Ambulatory Visit
Admission: RE | Admit: 2015-07-02 | Discharge: 2015-07-02 | Disposition: A | Discharge status: Home / Self Care | Payer: Medicaid Other | Source: Ambulatory Visit | Attending: Internal Medicine | Admitting: Internal Medicine

## 2015-07-02 DIAGNOSIS — Z0000 Encounter for general adult medical examination without abnormal findings: Secondary | ICD-10-CM

## 2015-07-07 ENCOUNTER — Other Ambulatory Visit: Payer: Self-pay | Admitting: Internal Medicine

## 2015-07-07 DIAGNOSIS — R928 Other abnormal and inconclusive findings on diagnostic imaging of breast: Secondary | ICD-10-CM

## 2015-07-13 ENCOUNTER — Ambulatory Visit
Admission: RE | Admit: 2015-07-13 | Discharge: 2015-07-13 | Disposition: A | Discharge status: Home / Self Care | Payer: Medicaid Other | Source: Ambulatory Visit | Attending: Internal Medicine | Admitting: Internal Medicine

## 2015-07-13 DIAGNOSIS — R928 Other abnormal and inconclusive findings on diagnostic imaging of breast: Secondary | ICD-10-CM

## 2015-12-28 ENCOUNTER — Other Ambulatory Visit: Payer: Self-pay | Admitting: Internal Medicine

## 2016-07-14 ENCOUNTER — Other Ambulatory Visit: Payer: Self-pay | Admitting: Internal Medicine

## 2016-12-14 DIAGNOSIS — M25569 Pain in unspecified knee: Secondary | ICD-10-CM | POA: Diagnosis not present

## 2016-12-14 DIAGNOSIS — G4700 Insomnia, unspecified: Secondary | ICD-10-CM | POA: Diagnosis not present

## 2017-02-28 DIAGNOSIS — M25569 Pain in unspecified knee: Secondary | ICD-10-CM | POA: Diagnosis not present

## 2017-02-28 DIAGNOSIS — F329 Major depressive disorder, single episode, unspecified: Secondary | ICD-10-CM | POA: Diagnosis not present

## 2017-02-28 DIAGNOSIS — G4700 Insomnia, unspecified: Secondary | ICD-10-CM | POA: Diagnosis not present

## 2017-04-12 DIAGNOSIS — M1711 Unilateral primary osteoarthritis, right knee: Secondary | ICD-10-CM | POA: Diagnosis not present

## 2017-04-12 DIAGNOSIS — M170 Bilateral primary osteoarthritis of knee: Secondary | ICD-10-CM | POA: Diagnosis not present

## 2017-04-12 DIAGNOSIS — M1712 Unilateral primary osteoarthritis, left knee: Secondary | ICD-10-CM | POA: Diagnosis not present

## 2017-04-25 DIAGNOSIS — M1990 Unspecified osteoarthritis, unspecified site: Secondary | ICD-10-CM | POA: Diagnosis not present

## 2017-04-25 DIAGNOSIS — F329 Major depressive disorder, single episode, unspecified: Secondary | ICD-10-CM | POA: Diagnosis not present

## 2017-04-25 DIAGNOSIS — N939 Abnormal uterine and vaginal bleeding, unspecified: Secondary | ICD-10-CM | POA: Diagnosis not present

## 2017-04-25 DIAGNOSIS — R634 Abnormal weight loss: Secondary | ICD-10-CM | POA: Diagnosis not present

## 2017-05-01 ENCOUNTER — Emergency Department (HOSPITAL_COMMUNITY)
Admission: EM | Admit: 2017-05-01 | Discharge: 2017-05-01 | Disposition: A | Discharge status: Home / Self Care | Payer: Medicare Other | Attending: Emergency Medicine | Admitting: Emergency Medicine

## 2017-05-01 ENCOUNTER — Encounter (HOSPITAL_COMMUNITY): Payer: Self-pay | Admitting: Emergency Medicine

## 2017-05-01 DIAGNOSIS — N950 Postmenopausal bleeding: Secondary | ICD-10-CM

## 2017-05-01 DIAGNOSIS — N939 Abnormal uterine and vaginal bleeding, unspecified: Secondary | ICD-10-CM | POA: Diagnosis present

## 2017-05-01 DIAGNOSIS — Z79899 Other long term (current) drug therapy: Secondary | ICD-10-CM | POA: Insufficient documentation

## 2017-05-01 LAB — COMPREHENSIVE METABOLIC PANEL
ALBUMIN: 4.2 g/dL (ref 3.5–5.0)
ALT: 29 U/L (ref 14–54)
ANION GAP: 11 (ref 5–15)
AST: 32 U/L (ref 15–41)
Alkaline Phosphatase: 77 U/L (ref 38–126)
BUN: 26 mg/dL — ABNORMAL HIGH (ref 6–20)
CHLORIDE: 102 mmol/L (ref 101–111)
CO2: 26 mmol/L (ref 22–32)
Calcium: 9.8 mg/dL (ref 8.9–10.3)
Creatinine, Ser: 0.95 mg/dL (ref 0.44–1.00)
GFR calc Af Amer: >60 mL/min (ref >60)
GFR calc non Af Amer: >60 mL/min (ref >60)
GLUCOSE: 102 mg/dL — AB (ref 65–99)
POTASSIUM: 4.1 mmol/L (ref 3.5–5.1)
SODIUM: 139 mmol/L (ref 135–145)
Total Bilirubin: 0.4 mg/dL (ref 0.3–1.2)
Total Protein: 7.6 g/dL (ref 6.5–8.1)

## 2017-05-01 LAB — URINALYSIS, ROUTINE W REFLEX MICROSCOPIC

## 2017-05-01 LAB — CBC
HCT: 38.2 % (ref 36.0–46.0)
HEMOGLOBIN: 13.2 g/dL (ref 12.0–15.0)
MCH: 31.7 pg (ref 26.0–34.0)
MCHC: 34.6 g/dL (ref 30.0–36.0)
MCV: 91.6 fL (ref 78.0–100.0)
Platelets: 228 10*3/uL (ref 150–400)
RBC: 4.17 MIL/uL (ref 3.87–5.11)
RDW: 14 % (ref 11.5–15.5)
WBC: 5.6 10*3/uL (ref 4.0–10.5)

## 2017-05-01 LAB — RAPID HIV SCREEN (HIV 1/2 AB+AG)
HIV 1/2 ANTIBODIES: NONREACTIVE
HIV-1 P24 Antigen - HIV24: NONREACTIVE

## 2017-05-01 LAB — WET PREP, GENITAL
Clue Cells Wet Prep HPF POC: NONE SEEN
SPERM: NONE SEEN
Trich, Wet Prep: NONE SEEN
Yeast Wet Prep HPF POC: NONE SEEN

## 2017-05-01 NOTE — ED Triage Notes (Signed)
Patient states that having vaginal bleeding describing it as dark blood with little clots and wears pad/protection continuously. Patient reports little lower abd pain. Patient states that she had this vaginal bleeding before 3 years ago and usually when she having depression.

## 2017-05-01 NOTE — ED Provider Notes (Signed)
[facility] DEPT Provider Note   CSN: [id number] Arrival date & time: 05/01/17  1010     History   Chief Complaint Chief Complaint  Patient presents with  . Vaginal Bleeding    HPI Amy Roman is a 66 y.o. female presenting with 3 week history vaginal bleeding.  Patient states that she's had spotting intermittently since 2011, however this time the spotting Did not resolve spontaneously, and over the past several weeks, has grown heavier. She now reports she changes a pad twice a day, and uses toilet paper to help for any leakage. Bleeding is described as being filled with clots and bright red. She was evaluated by her primary care physician, and told to follow-up with a 'specialist.' Patient states that appointment was never made, so she is here for answers. She reports some mild suprapubic pain described as feeling like menstrual cramps. She denies fever, chills, chest pain, shortness of breath, nausea, vomiting, urinary symptoms, or abnormal bowel movements. She states she has never been to see an OB/GYN in the past.  HPI  Past Medical History:  Diagnosis Date  . Anxiety and depression 10/15/2014  . Unspecified hereditary and idiopathic peripheral neuropathy 05/14/2014    Patient Active Problem List   Diagnosis Date Noted  . Anxiety and depression 10/15/2014  . Vitamin B 12 deficiency 06/02/2014  . Vitamin D deficiency 06/02/2014  . Hereditary and idiopathic peripheral neuropathy 05/14/2014    Past Surgical History:  Procedure Laterality Date  . BREAST SURGERY     bx, no cancer   . CATARACT EXTRACTION Bilateral     OB History    No data available       Home Medications    Prior to Admission medications   Medication Sig Start Date End Date Taking? Authorizing Provider  Cyanocobalamin (VITAMIN B-12 IJ) Inject as directed.    [provider]  escitalopram (LEXAPRO) 10 MG tablet Take 1 tablet (10 mg total) by mouth daily. 03/11/15   Colon Branch,  MD  gabapentin (NEURONTIN) 300 MG capsule TAKE ONE CAPSULE BY MOUTH THREE TIMES DAILY 12/28/15   Colon Branch, MD  Ibuprofen (MOTRIN PO) Take by mouth.    [provider]    Family History Family History  Problem Relation Age of Onset  . Diabetes Mother   . Neuropathy Mother   . Neuropathy Sister   . Other Father   . Colon cancer Neg Hx   . Breast cancer Neg Hx     Social History Social History  Substance Use Topics  . Smoking status: Never Smoker  . Smokeless tobacco: Never Used  . Alcohol use No     Allergies   Patient has no known allergies.   Review of Systems Review of Systems  Constitutional: Negative for chills and fever.  Gastrointestinal: Positive for abdominal pain. Negative for blood in stool, diarrhea and nausea.  Genitourinary: Positive for vaginal bleeding. Negative for dysuria and frequency.     Physical Exam Updated Vital Signs BP 130/86   Pulse 80   Temp 98.5 F (36.9 C) (Oral)   Resp 16   Wt 88.9 kg (196 lb)   SpO2 100%   BMI 37.03 kg/m   Physical Exam  Constitutional: She is oriented to person, place, and time. She appears well-developed and well-nourished.  HENT:  Head: Normocephalic and atraumatic.  Eyes: Pupils are equal, round, and reactive to light.  Neck: Normal range of motion.  Cardiovascular: Normal rate, regular rhythm, normal  heart sounds and intact distal pulses.   Pulmonary/Chest: Effort normal and breath sounds normal. No respiratory distress. She has no wheezes.  Abdominal: Soft. Bowel sounds are normal. She exhibits no distension. There is no tenderness.  Genitourinary: Rectum normal. Pelvic exam was performed with patient supine. Uterus is not tender. Cervix exhibits no motion tenderness. Right adnexum displays no mass and no tenderness. Left adnexum displays no mass and no tenderness. There is bleeding in the vagina.  Genitourinary Comments: Difficult pelvic exam due to patient's body habitus and arthritis.     Musculoskeletal: Normal range of motion.  Neurological: She is alert and oriented to person, place, and time.  Skin: Skin is warm and dry.  Psychiatric: She has a normal mood and affect.  Nursing note and vitals reviewed.    ED Treatments / Results  Labs (all labs ordered are listed, but only abnormal results are displayed) Labs Reviewed  WET PREP, GENITAL - Abnormal; Notable for the following:       Result Value   WBC, Wet Prep HPF POC MANY (*)    All other components within normal limits  URINALYSIS, ROUTINE W REFLEX MICROSCOPIC - Abnormal; Notable for the following:    Color, Urine BROWN (*)    APPearance TURBID (*)    Glucose, UA   (*)    Value: TEST NOT REPORTED DUE TO COLOR INTERFERENCE OF URINE PIGMENT   Hgb urine dipstick   (*)    Value: TEST NOT REPORTED DUE TO COLOR INTERFERENCE OF URINE PIGMENT   Bilirubin Urine   (*)    Value: TEST NOT REPORTED DUE TO COLOR INTERFERENCE OF URINE PIGMENT   Ketones, ur   (*)    Value: TEST NOT REPORTED DUE TO COLOR INTERFERENCE OF URINE PIGMENT   Protein, ur   (*)    Value: TEST NOT REPORTED DUE TO COLOR INTERFERENCE OF URINE PIGMENT   Nitrite   (*)    Value: TEST NOT REPORTED DUE TO COLOR INTERFERENCE OF URINE PIGMENT   Leukocytes, UA   (*)    Value: TEST NOT REPORTED DUE TO COLOR INTERFERENCE OF URINE PIGMENT   Bacteria, UA FEW (*)    Squamous Epithelial / LPF 6-30 (*)    All other components within normal limits  COMPREHENSIVE METABOLIC PANEL - Abnormal; Notable for the following:    Glucose, Bld 102 (*)    BUN 26 (*)    All other components within normal limits  CBC  RAPID HIV SCREEN (HIV 1/2 AB+AG)  HIV ANTIBODY (ROUTINE TESTING)  GC/CHLAMYDIA PROBE AMP ([facility]) NOT AT [facility]    EKG  EKG Interpretation None       Radiology No results found.  Procedures Procedures (including critical care time)  Medications Ordered in ED Medications - No data to display   Initial Impression / Assessment and Plan / ED  Course  I have reviewed the triage vital signs and the nursing notes.  Pertinent labs & imaging results that were available during my care of the patient were reviewed by me and considered in my medical decision making (see chart for details).     Patient vaginal bleeding 3 wks. Bleeding began as just spotting, but has grown heavier with clots. Patient denies abdominal pain, fever, chills, or sxs of infection. Patient has not been seen by OB/GYN. Will do pelvic exam and test for infection.   GU exam difficult due to patient body habitus and BLE arthritis. Blood noted without clots. No obvious discharge  or masses. Patient without significant tenderness. Will order basic labs, UA, and tests for STDs. Patient will likely need to follow-up with GYN.  Labs and physical exam reassuring. No evidence of UTI this time. Will discharge patient with injections to follow-up with GYN. Patient appears safe for discharge. Return precautions given. Patient states she understands and agrees to plan.   Final Clinical Impressions(s) / ED Diagnoses   Final diagnoses:  Postmenopausal bleeding    New Prescriptions Discharge Medication List as of 05/01/2017  1:50 PM       Franchot Heidelberg, PA-C 05/01/17 1611    Lacretia Leigh, MD 05/02/17 [phone]

## 2017-05-01 NOTE — Discharge Instructions (Signed)
It is very important that you follow up with gynecology. Return to the emergency department you develop fever, chills, worsening abdominal pain, or any new or worsening symptoms.

## 2017-05-02 ENCOUNTER — Inpatient Hospital Stay (HOSPITAL_COMMUNITY)
Admission: AD | Admit: 2017-05-02 | Discharge: 2017-05-02 | Disposition: A | Discharge status: Home / Self Care | Payer: Medicare Other | Source: Ambulatory Visit | Attending: Family Medicine | Admitting: Family Medicine

## 2017-05-02 ENCOUNTER — Other Ambulatory Visit (HOSPITAL_COMMUNITY): Payer: Self-pay | Admitting: Family Medicine

## 2017-05-02 DIAGNOSIS — G609 Hereditary and idiopathic neuropathy, unspecified: Secondary | ICD-10-CM | POA: Diagnosis not present

## 2017-05-02 DIAGNOSIS — N950 Postmenopausal bleeding: Secondary | ICD-10-CM | POA: Insufficient documentation

## 2017-05-02 DIAGNOSIS — Z806 Family history of leukemia: Secondary | ICD-10-CM | POA: Diagnosis not present

## 2017-05-02 DIAGNOSIS — F329 Major depressive disorder, single episode, unspecified: Secondary | ICD-10-CM | POA: Diagnosis not present

## 2017-05-02 DIAGNOSIS — F419 Anxiety disorder, unspecified: Secondary | ICD-10-CM | POA: Diagnosis not present

## 2017-05-02 DIAGNOSIS — N939 Abnormal uterine and vaginal bleeding, unspecified: Secondary | ICD-10-CM | POA: Diagnosis present

## 2017-05-02 DIAGNOSIS — Z79899 Other long term (current) drug therapy: Secondary | ICD-10-CM | POA: Insufficient documentation

## 2017-05-02 LAB — GC/CHLAMYDIA PROBE AMP (~~LOC~~) NOT AT ARMC
CHLAMYDIA, DNA PROBE: NEGATIVE
NEISSERIA GONORRHEA: NEGATIVE

## 2017-05-02 MED ORDER — MEGESTROL ACETATE 40 MG PO TABS: 40.0000 mg | ORAL_TABLET | Freq: Two times a day (BID) | ORAL | 0 refills | Status: DC

## 2017-05-02 NOTE — MAU Provider Note (Signed)
Chief Complaint: Vaginal Bleeding   SUBJECTIVE HPI: Amy Roman is a 66 y.o.  who presents to [facility] reporting VB, postmenopausal.  Patient states she has been having bleeding for 9 days.   Patient states she feels very tired. She states she is changing her pads frequently but not soaking through an entire pad every hour, and denies any large clots of blood. In the ER yesterday, with a workup done that showed a hemoglobin stable at 13.2, and 2 years ago was 13.4. She has no signs of leukocytosis or leukopenia, and her platelets are normal. She states she is healthy aside from severe lymphedema that runs in her family, that also is causing significant peripheral neuropathy in her legs.  She is postmenopausal since age 48, 43 years. She has had a history of postmenopausal bleeding in 2011 and 2015. States she never has had any gynecologic workup for this. She is no family history of uterine cancer, ovarian cancer, breast cancer. She states she has a family history of leukemia, and possible lymphoma based on patient's explanation of her aunt's cancer. Menarche was at age 39. She has been pregnant 3 times with 3 babies. She does not smoke. Risk factors include obesity, postmenopausal bleeding.   Past Medical History:  Diagnosis Date  . Anxiety and depression 10/15/2014  . Unspecified hereditary and idiopathic peripheral neuropathy 05/14/2014   OB History  No data available   Past Surgical History:  Procedure Laterality Date  . BREAST SURGERY     bx, no cancer   . CATARACT EXTRACTION Bilateral    Social History   Social History  . Marital status: Married    Spouse name: N/A  . Number of children: 4  . Years of education: N/A   Occupational History  . house     Social History Main Topics  . Smoking status: Never Smoker  . Smokeless tobacco: Never Used  . Alcohol use No  . Drug use: No  . Sexual activity: Not on file   Other Topics Concern  . Not on file    Social History Narrative   Original from [place], [place]   Moved to the [place] 2000   Widow    She lives with son.  She had four sons.         No current facility-administered medications on file prior to encounter.    Current Outpatient Prescriptions on File Prior to Encounter  Medication Sig Dispense Refill  . Cyanocobalamin (VITAMIN B-12 IJ) Inject as directed.    . escitalopram (LEXAPRO) 10 MG tablet Take 1 tablet (10 mg total) by mouth daily. 30 tablet 6  . gabapentin (NEURONTIN) 300 MG capsule TAKE ONE CAPSULE BY MOUTH THREE TIMES DAILY 90 capsule 0  . Ibuprofen (MOTRIN PO) Take by mouth.     No Known Allergies  I have reviewed the past Medical Hx, Surgical Hx, Social Hx, Allergies and Medications.   REVIEW OF SYSTEMS  A comprehensive ROS was negative except per HPI.   OBJECTIVE Patient Vitals for the past 24 hrs:  BP Temp Temp src Pulse Resp  05/02/17 0955 91/77 99.1 F (37.3 C) Oral 80 16    PHYSICAL EXAM Constitutional: Well-developed, well-nourished obese female in no acute distress.  Cardiovascular: normal rate, rhythm, no murmurs Respiratory: normal rate and effort.  GI: Abd soft, non-tender, non-distended. Pos BS x 4 MS: Extremities nontender, chronic severe lymphedema of bilateral lower extremities, equal. No calf tenderness. Arthritis of joints making pelvic difficult due  to limited ROM of knees. Neurologic: Alert and oriented x 4.  GU: Neg CVAT. SPECULUM EXAM: +Female circumcision, class II. Dark red blood noted in vaginal vault and adherent to cervical os. Cervix clean. No clots. No active pouring out bleeding when bearing down.  BIMANUAL: cervix closed; uterus normal size however limited by body habitus, no adnexal tenderness. No CMT.  LAB RESULTS Results for orders placed or performed during the hospital encounter of 05/01/17 (from the past 24 hour(s))  Wet prep, genital     Status: Abnormal   Collection Time: 05/01/17 12:30 PM  Result Value Ref Range    Yeast Wet Prep HPF POC NONE SEEN NONE SEEN   Trich, Wet Prep NONE SEEN NONE SEEN   Clue Cells Wet Prep HPF POC NONE SEEN NONE SEEN   WBC, Wet Prep HPF POC MANY (A) NONE SEEN   Sperm NONE SEEN   CBC     Status: None   Collection Time: 05/01/17 12:46 PM  Result Value Ref Range   WBC 5.6 4.0 - 10.5 K/uL   RBC 4.17 3.87 - 5.11 MIL/uL   Hemoglobin 13.2 12.0 - 15.0 g/dL   HCT 38.2 36.0 - 46.0 %   MCV 91.6 78.0 - 100.0 fL   MCH 31.7 26.0 - 34.0 pg   MCHC 34.6 30.0 - 36.0 g/dL   RDW 14.0 11.5 - 15.5 %   Platelets 228 150 - 400 K/uL  Rapid HIV screen (HIV 1/2 Ab+Ag) ([facility] Only)     Status: None   Collection Time: 05/01/17 12:46 PM  Result Value Ref Range   HIV-1 P24 Antigen - HIV24 NON REACTIVE NON REACTIVE   HIV 1/2 Antibodies NON REACTIVE NON REACTIVE   Interpretation (HIV Ag Ab)      A non reactive test result means that HIV 1 or HIV 2 antibodies and HIV 1 p24 antigen were not detected in the specimen.  Comprehensive metabolic panel     Status: Abnormal   Collection Time: 05/01/17 12:56 PM  Result Value Ref Range   Sodium 139 135 - 145 mmol/L   Potassium 4.1 3.5 - 5.1 mmol/L   Chloride 102 101 - 111 mmol/L   CO2 26 22 - 32 mmol/L   Glucose, Bld 102 (H) 65 - 99 mg/dL   BUN 26 (H) 6 - 20 mg/dL   Creatinine, Ser 0.95 0.44 - 1.00 mg/dL   Calcium 9.8 8.9 - 10.3 mg/dL   Total Protein 7.6 6.5 - 8.1 g/dL   Albumin 4.2 3.5 - 5.0 g/dL   AST 32 15 - 41 U/L   ALT 29 14 - 54 U/L   Alkaline Phosphatase 77 38 - 126 U/L   Total Bilirubin 0.4 0.3 - 1.2 mg/dL   GFR calc non Af Amer >60 >60 mL/min   GFR calc Af Amer >60 >60 mL/min   Anion gap 11 5 - 15  Urinalysis, Routine w reflex microscopic     Status: Abnormal   Collection Time: 05/01/17  1:10 PM  Result Value Ref Range   Color, Urine BROWN (A) YELLOW   APPearance TURBID (A) CLEAR   Specific Gravity, Urine  1.005 - 1.030    TEST NOT REPORTED DUE TO COLOR INTERFERENCE OF URINE PIGMENT   pH  5.0 - 8.0    TEST NOT REPORTED DUE TO COLOR  INTERFERENCE OF URINE PIGMENT   Glucose, UA (A) NEGATIVE mg/dL    TEST NOT REPORTED DUE TO COLOR INTERFERENCE OF URINE PIGMENT   Hgb  urine dipstick (A) NEGATIVE    TEST NOT REPORTED DUE TO COLOR INTERFERENCE OF URINE PIGMENT   Bilirubin Urine (A) NEGATIVE    TEST NOT REPORTED DUE TO COLOR INTERFERENCE OF URINE PIGMENT   Ketones, ur (A) NEGATIVE mg/dL    TEST NOT REPORTED DUE TO COLOR INTERFERENCE OF URINE PIGMENT   Protein, ur (A) NEGATIVE mg/dL    TEST NOT REPORTED DUE TO COLOR INTERFERENCE OF URINE PIGMENT   Nitrite (A) NEGATIVE    TEST NOT REPORTED DUE TO COLOR INTERFERENCE OF URINE PIGMENT   Leukocytes, UA (A) NEGATIVE    TEST NOT REPORTED DUE TO COLOR INTERFERENCE OF URINE PIGMENT   RBC / HPF TOO NUMEROUS TO COUNT 0 - 5 RBC/hpf   WBC, UA 6-30 0 - 5 WBC/hpf   Bacteria, UA FEW (A) NONE SEEN   Squamous Epithelial / LPF 6-30 (A) NONE SEEN   Mucous PRESENT     IMAGING No results found.  MAU COURSE SSE/SVE Reviewed labs from yesterday's ER visit, no sign of anemia with minimal to moderate bleeding  MDM Plan of care reviewed with patient, including labs and tests ordered and medical treatment. Given no sign of anemia or significant hemorrhaging from the uterus, patient is stable to go home without needing Megace. Bleeding precautions were given. Patient has an appointment already set up with Dr. Ihor Dow on July 25 at 10 AM. We will send patient for outpatient ultrasound prior to her appointment.   ASSESSMENT 1. Postmenopausal bleeding     PLAN Discharge home in stable condition. Appt for EMB and further workup Megace   Allergies as of 05/02/2017   No Known Allergies     Medication List    TAKE these medications   escitalopram 10 MG tablet Commonly known as:  LEXAPRO Take 1 tablet (10 mg total) by mouth daily.   gabapentin 300 MG capsule Commonly known as:  NEURONTIN TAKE ONE CAPSULE BY MOUTH THREE TIMES DAILY   megestrol 40 MG tablet Commonly known as:   MEGACE Take 1 tablet (40 mg total) by mouth 2 (two) times daily.   MOTRIN PO Take by mouth.   VITAMIN B-12 IJ Inject as directed.        Katherine Basset, DO OB Fellow 05/02/2017 11:05 AM

## 2017-05-02 NOTE — MAU Note (Addendum)
Presents to triage wanting meds for bleeding. Pt states was seen downstairs clinic and was told to come up here.   Provider at bs assessing pt.   1143: Discharge instructions given with pt understanding. Pt left unit via ambulatory with family.

## 2017-05-02 NOTE — Discharge Instructions (Signed)
Postmenopausal Bleeding Postmenopausal bleeding is any bleeding after menopause. Menopause is when a woman's period stops. Any type of bleeding after menopause is concerning. It should be checked by your doctor. Any treatment will depend on the cause. Follow these instructions at home: Watch your condition for any changes.  Avoid the use of tampons and douches as told by your doctor.  Change your pads often.  Get regular pelvic exams and Pap tests.  Keep all appointments for tests as told by your doctor.  Contact a doctor if:  Your bleeding lasts for more than 1 week.  You have belly (abdominal) pain.  You have bleeding after sex (intercourse). Get help right away if:  You have a fever, chills, a headache, dizziness, muscle aches, and bleeding.  You have strong pain with bleeding.  You have clumps of blood (blood clots) coming from your vagina.  You have bleeding and need more than 1 pad an hour.  You feel like you are going to pass out (faint). This information is not intended to replace advice given to you by your health care provider. Make sure you discuss any questions you have with your health care provider. Document Released: 07/19/2008 Document Revised: 03/17/2016 Document Reviewed: 05/09/2013 Elsevier Interactive Patient Education  2017 [employer]  

## 2017-05-02 NOTE — MAU Note (Signed)
Urine in lab 

## 2017-05-03 ENCOUNTER — Ambulatory Visit (HOSPITAL_COMMUNITY)
Admission: RE | Admit: 2017-05-03 | Discharge: 2017-05-03 | Disposition: A | Discharge status: Home / Self Care | Payer: Medicare Other | Source: Ambulatory Visit | Attending: Family Medicine | Admitting: Family Medicine

## 2017-05-03 DIAGNOSIS — N950 Postmenopausal bleeding: Secondary | ICD-10-CM | POA: Insufficient documentation

## 2017-05-03 LAB — HIV ANTIBODY (ROUTINE TESTING W REFLEX): HIV SCREEN 4TH GENERATION: NONREACTIVE

## 2017-05-08 ENCOUNTER — Telehealth: Payer: Self-pay | Admitting: Lab

## 2017-05-08 NOTE — Telephone Encounter (Signed)
Patient son Amy Roman called the Ultrasound Department for results. Ultrasound transferred him to the clinic. Once verifying who he was and his mother was, I read to him the Ultrasound results. Patient son wanted more information on why his mother is still bleeding and what could be done. I told him he could talk to the doctor at his mother next appointment on 7/25. He said his mother would have to be seen sooner because he will be going out of town and gone for 2 weeks, so I told him to call the front desk for a cancellation.

## 2017-05-17 ENCOUNTER — Other Ambulatory Visit (HOSPITAL_COMMUNITY)
Admission: RE | Admit: 2017-05-17 | Discharge: 2017-05-17 | Disposition: A | Discharge status: Home / Self Care | Payer: Medicare Other | Source: Ambulatory Visit | Attending: Obstetrics & Gynecology | Admitting: Obstetrics & Gynecology

## 2017-05-17 ENCOUNTER — Ambulatory Visit (INDEPENDENT_AMBULATORY_CARE_PROVIDER_SITE_OTHER): Payer: Medicare Other | Admitting: Obstetrics & Gynecology

## 2017-05-17 VITALS — BP 134/89 | HR 78 | Wt 199.3 lb

## 2017-05-17 DIAGNOSIS — N950 Postmenopausal bleeding: Secondary | ICD-10-CM | POA: Diagnosis present

## 2017-05-17 DIAGNOSIS — N840 Polyp of corpus uteri: Secondary | ICD-10-CM | POA: Diagnosis not present

## 2017-05-17 NOTE — Progress Notes (Signed)
History:  66 y.o. No obstetric history on file. here today for PMPB. Pt was seen in the MAU after 9 days of bleeding. She was referred her for further eval and bx. Her LMP was 66 years of age.   The following portions of the patient's history were reviewed and updated as appropriate: allergies, current medications, past family history, past medical history, past social history, past surgical history and problem list.  Review of Systems:  Pertinent items are noted in HPI.   Objective:  Physical Exam BP 134/89   Pulse 78   Wt 199 lb 4.8 oz (90.4 kg)   BMI 37.66 kg/m    The indications for endometrial biopsy were reviewed.   Risks of the biopsy including cramping, bleeding, infection, uterine perforation, inadequate specimen and need for additional procedures  were discussed. The patient states she understands and agrees to undergo procedure today. Consent was signed. Time out was performed. Urine HCG was negative. A sterile speculum was placed in the patient's vagina and the cervix was prepped with Betadine. A single-toothed tenaculum was placed on the anterior lip of the cervix to stabilize it. The 3 mm pipelle was introduced into the endometrial cavity without difficulty to a depth of 8cm, and a moderate amount of tissue was obtained and sent to pathology. The instruments were removed from the patient's vagina. Minimal bleeding from the cervix was noted. The patient tolerated the procedure well. Routine post-procedure instructions were given to the patient.   Labs and Imaging US Transvaginal Non-ob  Result Date: 05/03/2017 CLINICAL DATA:  66 year old postmenopausal female presenting with postmenopausal bleeding. EXAM: TRANSABDOMINAL AND TRANSVAGINAL ULTRASOUND OF PELVIS TECHNIQUE: Both transabdominal and transvaginal ultrasound examinations of the pelvis were performed. Transabdominal technique was performed for global imaging of the pelvis including uterus, ovaries, adnexal regions, and pelvic  cul-de-sac. It was necessary to proceed with endovaginal exam following the transabdominal exam to visualize the endometrium and adnexa. COMPARISON:  None FINDINGS: Uterus Measurements: 8.0 x 4.3 x 4.4 cm. The uterus is retroverted on the transvaginal portion of the scan. Normal size uterus. No uterine fibroids or other myometrial abnormalities. Endometrium Thickness: 6 mm. No endometrial cavity fluid or focal endometrial mass demonstrated. Right ovary Measurements: 2.7 x 1.5 x 2.1 cm. Normal appearance/no adnexal mass. Left ovary Measurements: 2.4 x 1.2 x 1.8 cm. Normal appearance/no adnexal mass. Other findings No abnormal free fluid. IMPRESSION: 1. Bilayer endometrial thickness 6 mm. In the setting of post-menopausal bleeding, endometrial sampling is indicated to exclude carcinoma. If results are benign, sonohysterogram should be considered for focal lesion work-up. (Ref: Radiological Reasoning: Algorithmic Workup of Abnormal Vaginal Bleeding with Endovaginal Sonography and Sonohysterography. AJR 2008; 762:G31-51). 2. No uterine fibroids. 3. Normal ovaries.  No adnexal masses. Electronically Signed   By: Ilona Sorrel M.D.   On: 05/03/2017 11:01   US Pelvis Complete  Result Date: 05/03/2017 CLINICAL DATA:  66 year old postmenopausal female presenting with postmenopausal bleeding. EXAM: TRANSABDOMINAL AND TRANSVAGINAL ULTRASOUND OF PELVIS TECHNIQUE: Both transabdominal and transvaginal ultrasound examinations of the pelvis were performed. Transabdominal technique was performed for global imaging of the pelvis including uterus, ovaries, adnexal regions, and pelvic cul-de-sac. It was necessary to proceed with endovaginal exam following the transabdominal exam to visualize the endometrium and adnexa. COMPARISON:  None FINDINGS: Uterus Measurements: 8.0 x 4.3 x 4.4 cm. The uterus is retroverted on the transvaginal portion of the scan. Normal size uterus. No uterine fibroids or other myometrial abnormalities.  Endometrium Thickness: 6 mm. No endometrial cavity fluid or  focal endometrial mass demonstrated. Right ovary Measurements: 2.7 x 1.5 x 2.1 cm. Normal appearance/no adnexal mass. Left ovary Measurements: 2.4 x 1.2 x 1.8 cm. Normal appearance/no adnexal mass. Other findings No abnormal free fluid. IMPRESSION: 1. Bilayer endometrial thickness 6 mm. In the setting of post-menopausal bleeding, endometrial sampling is indicated to exclude carcinoma. If results are benign, sonohysterogram should be considered for focal lesion work-up. (Ref: Radiological Reasoning: Algorithmic Workup of Abnormal Vaginal Bleeding with Endovaginal Sonography and Sonohysterography. AJR 2008; 142:L95-32). 2. No uterine fibroids. 3. Normal ovaries.  No adnexal masses. Electronically Signed   By: Ilona Sorrel M.D.   On: 05/03/2017 11:01    Assessment & Plan:  PMPB  F/u endo bx surg path Megace 40mg  daily F/u in 2 weeks or sooner prn The patient will follow up to review the results and for further management.     Viviann Broyles L. Harraway-Smith, M.D., Cherlynn June

## 2017-05-19 ENCOUNTER — Telehealth: Payer: Self-pay | Admitting: Obstetrics & Gynecology

## 2017-05-19 DIAGNOSIS — N950 Postmenopausal bleeding: Secondary | ICD-10-CM

## 2017-05-19 NOTE — Telephone Encounter (Signed)
Patient's husband called to say she needed her Rx that the Dr. said she could have for her bleeding.

## 2017-05-22 DIAGNOSIS — G4700 Insomnia, unspecified: Secondary | ICD-10-CM | POA: Diagnosis not present

## 2017-05-22 DIAGNOSIS — F329 Major depressive disorder, single episode, unspecified: Secondary | ICD-10-CM | POA: Diagnosis not present

## 2017-05-22 DIAGNOSIS — M25569 Pain in unspecified knee: Secondary | ICD-10-CM | POA: Diagnosis not present

## 2017-05-22 DIAGNOSIS — N939 Abnormal uterine and vaginal bleeding, unspecified: Secondary | ICD-10-CM | POA: Diagnosis not present

## 2017-05-23 MED ORDER — MEGESTROL ACETATE 40 MG PO TABS: 40.0000 mg | ORAL_TABLET | Freq: Two times a day (BID) | ORAL | 0 refills | Status: DC

## 2017-05-23 NOTE — Telephone Encounter (Signed)
Returned patient's call using Arabic interpreter "Izora Gala" [phone].Spoke with patient re: need for megace. Patient stated that when she first started bleeding earlier in the month it was heavy like a period. The medication she was prescribed helped and she stopped bleeding. However, since running out of the medication she has resumed bleeding but it is lighter than before. She has an appointment on 8/9. After verifying her pharmacy info I reordered her prescription for megace, patient aware she can pick it up later today. Understanding voiced.

## 2017-05-23 NOTE — Addendum Note (Signed)
Addended by: Riccardo Dubin on: 05/23/2017 08:59 AM   Modules accepted: Orders

## 2017-05-24 ENCOUNTER — Telehealth: Payer: Self-pay | Admitting: *Deleted

## 2017-05-24 NOTE — Telephone Encounter (Signed)
Called patient and left message to call us back regarding results.

## 2017-05-24 NOTE — Telephone Encounter (Signed)
-----   Message from Lavonia Drafts, MD sent at 05/22/2017 10:34 AM EDT ----- Please call pt. (you will need an interpreter).  Her path reports for her Endo bx suggests a polyp.  I recommend Megace 40mg  daily for 3-6 months.   Thx,  clh-S

## 2017-05-31 NOTE — Telephone Encounter (Signed)
Contacted pt with Interpreter Feryal and informed pt that she has a polyp.  I also informed pt that she needs to take her Megace 40 mg po daily per providers recommendation.  Pt stated that because she has been bleeding she has been taking the Megace 40 mg tablet tid and that has helped.  I informed pt that the provider prescribed her to take it twice day on 05/23/17 and that she can notify the provider tomorrow at her appt.  Pt stated understanding with no further questions.

## 2017-06-01 ENCOUNTER — Telehealth: Payer: Self-pay | Admitting: *Deleted

## 2017-06-01 ENCOUNTER — Ambulatory Visit (INDEPENDENT_AMBULATORY_CARE_PROVIDER_SITE_OTHER): Payer: Medicare Other | Admitting: Obstetrics & Gynecology

## 2017-06-01 ENCOUNTER — Encounter: Payer: Self-pay | Admitting: Obstetrics & Gynecology

## 2017-06-01 VITALS — BP 127/60 | HR 67

## 2017-06-01 DIAGNOSIS — N840 Polyp of corpus uteri: Secondary | ICD-10-CM

## 2017-06-01 DIAGNOSIS — N950 Postmenopausal bleeding: Secondary | ICD-10-CM

## 2017-06-01 MED ORDER — MEGESTROL ACETATE 40 MG PO TABS: 40.0000 mg | ORAL_TABLET | Freq: Two times a day (BID) | ORAL | 3 refills | Status: DC

## 2017-06-01 NOTE — Progress Notes (Signed)
History:  66 y.o. PMP pt here today for eval of PMPB.  Pt is on Megace 40mg  bid. Her bx revealed a benign polyp. She reports less bleeding on Megace but, she still reports daily spotting. She denies pain or other sx.   The following portions of the patient's history were reviewed and updated as appropriate: allergies, current medications, past family history, past medical history, past social history, past surgical history and problem list.  Review of Systems:  Pertinent items are noted in HPI.   Objective:  Physical Exam Blood pressure 127/60, pulse 67.  CONSTITUTIONAL: Well-developed, well-nourished female in no acute distress.  HENT:  Normocephalic, atraumatic EYES: Conjunctivae and EOM are normal. No scleral icterus.  NECK: Normal range of motion SKIN: Skin is warm and dry. No rash noted. Not diaphoretic.No pallor. [place]: Alert and oriented to person, place, and time. Normal coordination.  Pelvic: deferred  Labs and Imaging US Transvaginal Non-ob  Result Date: 05/03/2017 CLINICAL DATA:  66 year old postmenopausal female presenting with postmenopausal bleeding. EXAM: TRANSABDOMINAL AND TRANSVAGINAL ULTRASOUND OF PELVIS TECHNIQUE: Both transabdominal and transvaginal ultrasound examinations of the pelvis were performed. Transabdominal technique was performed for global imaging of the pelvis including uterus, ovaries, adnexal regions, and pelvic cul-de-sac. It was necessary to proceed with endovaginal exam following the transabdominal exam to visualize the endometrium and adnexa. COMPARISON:  None FINDINGS: Uterus Measurements: 8.0 x 4.3 x 4.4 cm. The uterus is retroverted on the transvaginal portion of the scan. Normal size uterus. No uterine fibroids or other myometrial abnormalities. Endometrium Thickness: 6 mm. No endometrial cavity fluid or focal endometrial mass demonstrated. Right ovary Measurements: 2.7 x 1.5 x 2.1 cm. Normal appearance/no adnexal mass. Left ovary Measurements:  2.4 x 1.2 x 1.8 cm. Normal appearance/no adnexal mass. Other findings No abnormal free fluid. IMPRESSION: 1. Bilayer endometrial thickness 6 mm. In the setting of post-menopausal bleeding, endometrial sampling is indicated to exclude carcinoma. If results are benign, sonohysterogram should be considered for focal lesion work-up. (Ref: Radiological Reasoning: Algorithmic Workup of Abnormal Vaginal Bleeding with Endovaginal Sonography and Sonohysterography. AJR 2008; 696:E95-28). 2. No uterine fibroids. 3. Normal ovaries.  No adnexal masses. Electronically Signed   By: Ilona Sorrel M.D.   On: 05/03/2017 11:01   US Pelvis Complete  Result Date: 05/03/2017 CLINICAL DATA:  66 year old postmenopausal female presenting with postmenopausal bleeding. EXAM: TRANSABDOMINAL AND TRANSVAGINAL ULTRASOUND OF PELVIS TECHNIQUE: Both transabdominal and transvaginal ultrasound examinations of the pelvis were performed. Transabdominal technique was performed for global imaging of the pelvis including uterus, ovaries, adnexal regions, and pelvic cul-de-sac. It was necessary to proceed with endovaginal exam following the transabdominal exam to visualize the endometrium and adnexa. COMPARISON:  None FINDINGS: Uterus Measurements: 8.0 x 4.3 x 4.4 cm. The uterus is retroverted on the transvaginal portion of the scan. Normal size uterus. No uterine fibroids or other myometrial abnormalities. Endometrium Thickness: 6 mm. No endometrial cavity fluid or focal endometrial mass demonstrated. Right ovary Measurements: 2.7 x 1.5 x 2.1 cm. Normal appearance/no adnexal mass. Left ovary Measurements: 2.4 x 1.2 x 1.8 cm. Normal appearance/no adnexal mass. Other findings No abnormal free fluid. IMPRESSION: 1. Bilayer endometrial thickness 6 mm. In the setting of post-menopausal bleeding, endometrial sampling is indicated to exclude carcinoma. If results are benign, sonohysterogram should be considered for focal lesion work-up. (Ref: Radiological  Reasoning: Algorithmic Workup of Abnormal Vaginal Bleeding with Endovaginal Sonography and Sonohysterography. AJR 2008; 413:K44-01). 2. No uterine fibroids. 3. Normal ovaries.  No adnexal masses. Electronically Signed   By:  Ilona Sorrel M.D.   On: 05/03/2017 11:01   05/17/2017 Diagnosis Endometrium, biopsy - BENIGN ENDOMETRIAL TYPE POLYP.  Assessment & Plan:  PMPB  Polyp on biopsy   Megace 40 bid  Pt offered LnIUD vs hysteroscopy with removal of polyp in hosp or outpt. Pt declines procedure at this time . She would like to ive the meds 3 more months.   F/u in 3 months or sooner prn  Pt speaks and understands some English but, there was a live Arabic interpreter present for the entire visit.   Total face-to-face time with patient was 15 min.  Greater than 50% was spent in counseling and coordination of care with the patient.   Jody Silas L. Harraway-Smith, M.D., Cherlynn June

## 2017-06-01 NOTE — Telephone Encounter (Signed)
Received a fax from [facility] requesting a prior authorization for megace dated 05/24/17. I called [phone] and they state it has been denied. Asked if I wanted to submit an appeal which I stated I did. Information given over the phone and decision will be given in 3 days. I notified [facility] appeal was initiated. They state she paid 05/24/17 for rx as selfpay.

## 2017-06-02 ENCOUNTER — Encounter: Payer: Self-pay | Admitting: Obstetrics & Gynecology

## 2017-07-17 DIAGNOSIS — F329 Major depressive disorder, single episode, unspecified: Secondary | ICD-10-CM | POA: Diagnosis not present

## 2017-07-17 DIAGNOSIS — G4700 Insomnia, unspecified: Secondary | ICD-10-CM | POA: Diagnosis not present

## 2017-07-17 DIAGNOSIS — M25569 Pain in unspecified knee: Secondary | ICD-10-CM | POA: Diagnosis not present

## 2017-07-24 ENCOUNTER — Encounter: Payer: Self-pay | Admitting: Obstetrics & Gynecology

## 2017-07-24 ENCOUNTER — Ambulatory Visit (INDEPENDENT_AMBULATORY_CARE_PROVIDER_SITE_OTHER): Payer: Medicare Other | Admitting: Clinical

## 2017-07-24 ENCOUNTER — Ambulatory Visit (INDEPENDENT_AMBULATORY_CARE_PROVIDER_SITE_OTHER): Payer: Medicare Other | Admitting: Obstetrics & Gynecology

## 2017-07-24 VITALS — BP 144/69 | HR 63 | Ht 59.06 in | Wt 194.0 lb

## 2017-07-24 DIAGNOSIS — F329 Major depressive disorder, single episode, unspecified: Secondary | ICD-10-CM

## 2017-07-24 DIAGNOSIS — F4323 Adjustment disorder with mixed anxiety and depressed mood: Secondary | ICD-10-CM

## 2017-07-24 DIAGNOSIS — F419 Anxiety disorder, unspecified: Secondary | ICD-10-CM

## 2017-07-24 DIAGNOSIS — N950 Postmenopausal bleeding: Secondary | ICD-10-CM | POA: Diagnosis not present

## 2017-07-24 NOTE — BH Specialist Note (Signed)
Integrated Behavioral Health Initial Visit  MRN: [id number] Name: Amy Roman A Roman [facility]  Number of [place] Clinician visits:: 1/6 Session Start time: 4:45  Session End time: 5:30 Total time: 45 minutes  Type of Service: [place] Interpretor:No. Interpretor Name and Language: n/a   Warm Hand Off Completed.       SUBJECTIVE: Amy Roman is a 66 y.o. female accompanied by n/a Patient was referred by Dr Amy Roman for depression. Patient reports the following symptoms/concerns: Pt states her primary symptoms are feeling depressed and fatigue; pt wants to find out the natural medication she was on for years that helped her cope with her feelings the best, and would like to help others, as she helped her husband, prior to his passing. Duration of problem: Depressed feelings come and go through the years; Severity of problem: moderate  OBJECTIVE: Mood: Depressed and Affect: Tearful Risk of harm to self or others: No plan to harm self or others  LIFE CONTEXT: Family and Social: Pt lives near "many friends", and visits family routinely, including 4 sons, 5 grandchildren, and is planning a visit with her 77yo mother soon School/Work: Retired, but aspires to Personnel officer: Spending time with friends and family Life Changes: Grief and loss of friends and family over time; increased desire for meaningful daily activity  GOALS ADDRESSED: Patient will: 1. Reduce symptoms of: anxiety and depression 2. Increase knowledge and/or ability of: healthy habits  3. Demonstrate ability to: Increase healthy adjustment to current life circumstances  INTERVENTIONS: Interventions utilized: Solution-Focused Strategies and Psychoeducation and/or Health Education  Standardized Assessments completed: GAD-7 and PHQ 9  ASSESSMENT: Patient currently experiencing Adjustment disorder with mixed anxious and depressed mood   Patient may  benefit from psychoeducation and brief therapeutic interventions regarding coping with symptoms of anxiety and depression.  PLAN: 1. Follow up with behavioral health clinician on : At next medical visit  2. Behavioral recommendations:  -Talk to PCP about continuing medications to help with depression and anxiety -Continue using sleep sounds at bedtime -Work with son to find nursing homes close to home that need volunteers to sit with patients -Consider sitting outside for at least 10-20 minutes/day, every morning -Continue spending time with friends and family, as much as able -Counsellor regarding coping with symptoms of anxiety and depression -Consider [facility] walk-in clinic, to establish care for ongoing therapy 3. Referral(s): [place] (In Clinic) and [place] (LME/Outside Clinic) 4. "From scale of 1-10, how likely are you to follow plan?": 7  Amy Roman, LCSWA  Depression screen [facility] 2/9 07/24/2017  Decreased Interest 2  Down, Depressed, Hopeless 3  PHQ - 2 Score 5  Altered sleeping 3  Tired, decreased energy 3  Change in appetite 2  Feeling bad or failure about yourself  1  Trouble concentrating 2  Moving slowly or fidgety/restless 2  Suicidal thoughts 0  PHQ-9 Score 18

## 2017-07-24 NOTE — Progress Notes (Signed)
   Subjective:    Patient ID: Amy Roman, female    DOB: 03-25-1951, 66 y.o.   MRN: [id number]  HPI    Review of Systems     Objective:   Physical Exam Very pleasant English-speaking lady of [place] and [place] ancestry  Breathing, conversing normally Abd- benign     Assessment & Plan:  Depression- seeing Roselyn Reef today and she would like a referral to a psychiatrist PMB with polyp- She would like to have a hysteroscopy, and polyp removal. I sent Drema Balzarine an email to schedule this.

## 2017-07-25 ENCOUNTER — Encounter (HOSPITAL_COMMUNITY): Payer: Self-pay

## 2017-08-02 NOTE — Patient Instructions (Addendum)
Your procedure is scheduled on: Wednesday October 17,2018 at [place] through the [facility] at: 10:00 am  Pick up the phone at the desk and dial [phone].  Call this number if you have problems the morning of surgery: [phone].  Remember: Do NOT eat food or drink any liquids after: Midnight Tuesday October 16 Do NOT drink clear liquids after: Take these medicines the morning of surgery with a SIP OF WATER: None  STOP ALL VITAMINS AND SUPPLEMENTS TODAY  DO NOT SMOKE DAY OF SURGERY  Do NOT wear jewelry (body piercing), metal hair clips/bobby pins, make-up, or nail polish. Do NOT wear lotions, powders, or perfumes.  You may wear deoderant. Do NOT shave for 48 hours prior to surgery. Do NOT bring valuables to the hospital. Contacts, dentures, or bridgework may not be worn into surgery.  Have a responsible adult drive you home and stay with you for 24 hours after your procedure

## 2017-08-08 ENCOUNTER — Encounter (HOSPITAL_COMMUNITY)
Admission: RE | Admit: 2017-08-08 | Discharge: 2017-08-08 | Disposition: A | Discharge status: Home / Self Care | Payer: Medicare Other | Source: Ambulatory Visit | Attending: Obstetrics & Gynecology | Admitting: Obstetrics & Gynecology

## 2017-08-08 ENCOUNTER — Encounter (HOSPITAL_COMMUNITY): Payer: Self-pay

## 2017-08-08 ENCOUNTER — Other Ambulatory Visit: Payer: Self-pay

## 2017-08-08 DIAGNOSIS — N950 Postmenopausal bleeding: Secondary | ICD-10-CM | POA: Diagnosis not present

## 2017-08-08 DIAGNOSIS — K219 Gastro-esophageal reflux disease without esophagitis: Secondary | ICD-10-CM | POA: Diagnosis not present

## 2017-08-08 DIAGNOSIS — Z9841 Cataract extraction status, right eye: Secondary | ICD-10-CM | POA: Diagnosis not present

## 2017-08-08 DIAGNOSIS — Z833 Family history of diabetes mellitus: Secondary | ICD-10-CM | POA: Diagnosis not present

## 2017-08-08 DIAGNOSIS — Z820 Family history of epilepsy and other diseases of the nervous system: Secondary | ICD-10-CM | POA: Diagnosis not present

## 2017-08-08 DIAGNOSIS — Z9842 Cataract extraction status, left eye: Secondary | ICD-10-CM | POA: Diagnosis not present

## 2017-08-08 DIAGNOSIS — Z79899 Other long term (current) drug therapy: Secondary | ICD-10-CM | POA: Diagnosis not present

## 2017-08-08 DIAGNOSIS — M13862 Other specified arthritis, left knee: Secondary | ICD-10-CM | POA: Diagnosis not present

## 2017-08-08 DIAGNOSIS — N840 Polyp of corpus uteri: Secondary | ICD-10-CM | POA: Diagnosis not present

## 2017-08-08 DIAGNOSIS — R51 Headache: Secondary | ICD-10-CM | POA: Diagnosis not present

## 2017-08-08 DIAGNOSIS — E669 Obesity, unspecified: Secondary | ICD-10-CM | POA: Diagnosis not present

## 2017-08-08 DIAGNOSIS — M13861 Other specified arthritis, right knee: Secondary | ICD-10-CM | POA: Diagnosis not present

## 2017-08-08 DIAGNOSIS — G609 Hereditary and idiopathic neuropathy, unspecified: Secondary | ICD-10-CM | POA: Diagnosis not present

## 2017-08-08 DIAGNOSIS — N938 Other specified abnormal uterine and vaginal bleeding: Secondary | ICD-10-CM | POA: Diagnosis present

## 2017-08-08 DIAGNOSIS — F329 Major depressive disorder, single episode, unspecified: Secondary | ICD-10-CM | POA: Diagnosis not present

## 2017-08-08 DIAGNOSIS — F419 Anxiety disorder, unspecified: Secondary | ICD-10-CM | POA: Diagnosis not present

## 2017-08-08 HISTORY — DX: Headache, unspecified: R51.9

## 2017-08-08 HISTORY — DX: Depression, unspecified: F32.A

## 2017-08-08 HISTORY — DX: Gastro-esophageal reflux disease without esophagitis: K21.9

## 2017-08-08 HISTORY — DX: Unspecified osteoarthritis, unspecified site: M19.90

## 2017-08-08 HISTORY — DX: Headache: R51

## 2017-08-08 HISTORY — DX: Major depressive disorder, single episode, unspecified: F32.9

## 2017-08-08 LAB — CBC
HCT: 38.8 % (ref 36.0–46.0)
Hemoglobin: 12.8 g/dL (ref 12.0–15.0)
MCH: 30 pg (ref 26.0–34.0)
MCHC: 33 g/dL (ref 30.0–36.0)
MCV: 91.1 fL (ref 78.0–100.0)
Platelets: 235 10*3/uL (ref 150–400)
RBC: 4.26 MIL/uL (ref 3.87–5.11)
RDW: 13.9 % (ref 11.5–15.5)
WBC: 5 10*3/uL (ref 4.0–10.5)

## 2017-08-09 ENCOUNTER — Encounter (HOSPITAL_COMMUNITY): Payer: Self-pay

## 2017-08-09 ENCOUNTER — Ambulatory Visit (HOSPITAL_COMMUNITY): Payer: Medicare Other | Admitting: Anesthesiology

## 2017-08-09 ENCOUNTER — Ambulatory Visit (HOSPITAL_COMMUNITY)
Admission: RE | Admit: 2017-08-09 | Discharge: 2017-08-09 | Disposition: A | Discharge status: Home / Self Care | Payer: Medicare Other | Source: Ambulatory Visit | Attending: Obstetrics & Gynecology | Admitting: Obstetrics & Gynecology

## 2017-08-09 ENCOUNTER — Encounter (HOSPITAL_COMMUNITY)
Admission: RE | Disposition: A | Discharge status: Home / Self Care | Payer: Self-pay | Source: Ambulatory Visit | Attending: Obstetrics & Gynecology

## 2017-08-09 DIAGNOSIS — N938 Other specified abnormal uterine and vaginal bleeding: Secondary | ICD-10-CM | POA: Diagnosis not present

## 2017-08-09 DIAGNOSIS — M13861 Other specified arthritis, right knee: Secondary | ICD-10-CM | POA: Diagnosis not present

## 2017-08-09 DIAGNOSIS — N840 Polyp of corpus uteri: Secondary | ICD-10-CM | POA: Diagnosis not present

## 2017-08-09 DIAGNOSIS — Z9842 Cataract extraction status, left eye: Secondary | ICD-10-CM | POA: Insufficient documentation

## 2017-08-09 DIAGNOSIS — Z9841 Cataract extraction status, right eye: Secondary | ICD-10-CM | POA: Diagnosis not present

## 2017-08-09 DIAGNOSIS — Z833 Family history of diabetes mellitus: Secondary | ICD-10-CM | POA: Insufficient documentation

## 2017-08-09 DIAGNOSIS — N950 Postmenopausal bleeding: Secondary | ICD-10-CM | POA: Insufficient documentation

## 2017-08-09 DIAGNOSIS — Z820 Family history of epilepsy and other diseases of the nervous system: Secondary | ICD-10-CM | POA: Insufficient documentation

## 2017-08-09 DIAGNOSIS — R51 Headache: Secondary | ICD-10-CM | POA: Insufficient documentation

## 2017-08-09 DIAGNOSIS — F329 Major depressive disorder, single episode, unspecified: Secondary | ICD-10-CM | POA: Insufficient documentation

## 2017-08-09 DIAGNOSIS — F418 Other specified anxiety disorders: Secondary | ICD-10-CM | POA: Diagnosis not present

## 2017-08-09 DIAGNOSIS — K219 Gastro-esophageal reflux disease without esophagitis: Secondary | ICD-10-CM | POA: Insufficient documentation

## 2017-08-09 DIAGNOSIS — F419 Anxiety disorder, unspecified: Secondary | ICD-10-CM | POA: Diagnosis not present

## 2017-08-09 DIAGNOSIS — Z79899 Other long term (current) drug therapy: Secondary | ICD-10-CM | POA: Insufficient documentation

## 2017-08-09 DIAGNOSIS — G609 Hereditary and idiopathic neuropathy, unspecified: Secondary | ICD-10-CM | POA: Diagnosis not present

## 2017-08-09 DIAGNOSIS — N920 Excessive and frequent menstruation with regular cycle: Secondary | ICD-10-CM | POA: Diagnosis present

## 2017-08-09 DIAGNOSIS — M13862 Other specified arthritis, left knee: Secondary | ICD-10-CM | POA: Insufficient documentation

## 2017-08-09 DIAGNOSIS — E559 Vitamin D deficiency, unspecified: Secondary | ICD-10-CM | POA: Diagnosis not present

## 2017-08-09 DIAGNOSIS — E669 Obesity, unspecified: Secondary | ICD-10-CM | POA: Insufficient documentation

## 2017-08-09 HISTORY — PX: DILATATION & CURETTAGE/HYSTEROSCOPY WITH MYOSURE: SHX6511

## 2017-08-09 SURGERY — DILATATION & CURETTAGE/HYSTEROSCOPY WITH MYOSURE
Anesthesia: General | Site: Vagina

## 2017-08-09 MED ORDER — MEPERIDINE HCL 25 MG/ML IJ SOLN: 6.2500 mg | INTRAMUSCULAR | Status: DC | PRN

## 2017-08-09 MED ORDER — PROPOFOL 500 MG/50ML IV EMUL
INTRAVENOUS | Status: DC | PRN
  Administered 2017-08-09: 75 ug/kg/min via INTRAVENOUS

## 2017-08-09 MED ORDER — MIDAZOLAM HCL 2 MG/2ML IJ SOLN
INTRAMUSCULAR | Status: AC
  Filled 2017-08-09: qty 2

## 2017-08-09 MED ORDER — ONDANSETRON HCL 4 MG/2ML IJ SOLN
INTRAMUSCULAR | Status: AC
  Filled 2017-08-09: qty 2

## 2017-08-09 MED ORDER — PHENYLEPHRINE HCL 10 MG/ML IJ SOLN
INTRAMUSCULAR | Status: DC | PRN
  Administered 2017-08-09: 80 ug via INTRAVENOUS

## 2017-08-09 MED ORDER — FENTANYL CITRATE (PF) 100 MCG/2ML IJ SOLN
INTRAMUSCULAR | Status: AC
  Filled 2017-08-09: qty 4

## 2017-08-09 MED ORDER — PROPOFOL 10 MG/ML IV BOLUS
INTRAVENOUS | Status: AC
  Filled 2017-08-09: qty 20

## 2017-08-09 MED ORDER — HYDROCODONE-ACETAMINOPHEN 7.5-325 MG PO TABS: 1.0000 | ORAL_TABLET | Freq: Once | ORAL | Status: DC | PRN

## 2017-08-09 MED ORDER — ONDANSETRON HCL 4 MG/2ML IJ SOLN
INTRAMUSCULAR | Status: DC | PRN
  Administered 2017-08-09: 4 mg via INTRAVENOUS

## 2017-08-09 MED ORDER — PHENYLEPHRINE 40 MCG/ML (10ML) SYRINGE FOR IV PUSH (FOR BLOOD PRESSURE SUPPORT)
PREFILLED_SYRINGE | INTRAVENOUS | Status: AC
  Filled 2017-08-09: qty 10

## 2017-08-09 MED ORDER — IBUPROFEN 600 MG PO TABS: 600.0000 mg | ORAL_TABLET | Freq: Four times a day (QID) | ORAL | 1 refills | Status: DC | PRN

## 2017-08-09 MED ORDER — LACTATED RINGERS IV SOLN
INTRAVENOUS | Status: DC
  Administered 2017-08-09: 125 mL/h via INTRAVENOUS
  Administered 2017-08-09: 12:00:00 via INTRAVENOUS

## 2017-08-09 MED ORDER — LIDOCAINE HCL (CARDIAC) 20 MG/ML IV SOLN
INTRAVENOUS | Status: AC
  Filled 2017-08-09: qty 5

## 2017-08-09 MED ORDER — GLYCOPYRROLATE 0.2 MG/ML IJ SOLN
INTRAMUSCULAR | Status: AC
  Filled 2017-08-09: qty 1

## 2017-08-09 MED ORDER — SODIUM CHLORIDE 0.9 % IR SOLN
Status: DC | PRN
Start: 2017-08-09 — End: 2017-08-09
  Administered 2017-08-09: 3000 mL

## 2017-08-09 MED ORDER — BUPIVACAINE HCL (PF) 0.5 % IJ SOLN
INTRAMUSCULAR | Status: AC
  Filled 2017-08-09: qty 30

## 2017-08-09 MED ORDER — FENTANYL CITRATE (PF) 100 MCG/2ML IJ SOLN
25.0000 ug | INTRAMUSCULAR | Status: DC | PRN
  Administered 2017-08-09: 25 ug via INTRAVENOUS

## 2017-08-09 MED ORDER — BUPIVACAINE HCL (PF) 0.5 % IJ SOLN
INTRAMUSCULAR | Status: DC | PRN
  Administered 2017-08-09: 30 mL

## 2017-08-09 MED ORDER — FENTANYL CITRATE (PF) 100 MCG/2ML IJ SOLN
INTRAMUSCULAR | Status: DC | PRN
  Administered 2017-08-09 (×4): 50 ug via INTRAVENOUS

## 2017-08-09 MED ORDER — PROPOFOL 10 MG/ML IV BOLUS
INTRAVENOUS | Status: AC
  Filled 2017-08-09: qty 40

## 2017-08-09 MED ORDER — PROPOFOL 10 MG/ML IV BOLUS
INTRAVENOUS | Status: DC | PRN
  Administered 2017-08-09: 20 mg via INTRAVENOUS

## 2017-08-09 MED ORDER — MIDAZOLAM HCL 2 MG/2ML IJ SOLN
INTRAMUSCULAR | Status: DC | PRN
  Administered 2017-08-09 (×2): 1 mg via INTRAVENOUS

## 2017-08-09 MED ORDER — METOCLOPRAMIDE HCL 5 MG/ML IJ SOLN: 10.0000 mg | Freq: Once | INTRAMUSCULAR | Status: DC | PRN

## 2017-08-09 MED ORDER — DEXAMETHASONE SODIUM PHOSPHATE 10 MG/ML IJ SOLN
INTRAMUSCULAR | Status: AC
  Filled 2017-08-09: qty 1

## 2017-08-09 MED ORDER — FENTANYL CITRATE (PF) 100 MCG/2ML IJ SOLN
INTRAMUSCULAR | Status: AC
  Filled 2017-08-09: qty 2

## 2017-08-09 MED ORDER — LIDOCAINE HCL (CARDIAC) 20 MG/ML IV SOLN
INTRAVENOUS | Status: DC | PRN
  Administered 2017-08-09: 60 mg via INTRAVENOUS

## 2017-08-09 MED ORDER — DEXAMETHASONE SODIUM PHOSPHATE 10 MG/ML IJ SOLN
INTRAMUSCULAR | Status: DC | PRN
  Administered 2017-08-09: 10 mg via INTRAVENOUS

## 2017-08-09 SURGICAL SUPPLY — 17 items
CATH ROBINSON RED A/P 16FR (CATHETERS) IMPLANT
CONTAINER PREFILL 10% NBF 60ML (FORM) ×2 IMPLANT
DEVICE MYOSURE LITE (MISCELLANEOUS) IMPLANT
DEVICE MYOSURE REACH (MISCELLANEOUS) ×2 IMPLANT
FILTER ARTHROSCOPY CONVERTOR (FILTER) ×3 IMPLANT
GLOVE BIO SURGEON STRL SZ 6.5 (GLOVE) ×2 IMPLANT
GLOVE BIO SURGEONS STRL SZ 6.5 (GLOVE) ×1
GLOVE BIOGEL PI IND STRL 7.0 (GLOVE) ×1 IMPLANT
GLOVE BIOGEL PI INDICATOR 7.0 (GLOVE) ×2
GOWN STRL REUS W/TWL LRG LVL3 (GOWN DISPOSABLE) ×6 IMPLANT
PACK VAGINAL MINOR WOMEN LF (CUSTOM PROCEDURE TRAY) ×3 IMPLANT
PAD OB MATERNITY 4.3X12.25 (PERSONAL CARE ITEMS) ×3 IMPLANT
SEAL ROD LENS SCOPE MYOSURE (ABLATOR) ×3 IMPLANT
SLEEVE SCD COMPRESS KNEE MED (MISCELLANEOUS) ×3 IMPLANT
TOWEL OR 17X24 6PK STRL BLUE (TOWEL DISPOSABLE) ×6 IMPLANT
TUBING AQUILEX INFLOW (TUBING) ×3 IMPLANT
TUBING AQUILEX OUTFLOW (TUBING) ×3 IMPLANT

## 2017-08-09 NOTE — Anesthesia Postprocedure Evaluation (Addendum)
Anesthesia Post Note  Patient: Debbora A [name]-Hussein  Procedure(s) Performed: DILATATION & CURETTAGE/HYSTEROSCOPY WITH MYOSURE (N/A Vagina )     Patient location during evaluation: PACU Anesthesia Type: General Level of consciousness: awake and alert and oriented Pain management: pain level controlled Vital Signs Assessment: post-procedure vital signs reviewed and stable Respiratory status: spontaneous breathing, nonlabored ventilation and respiratory function stable Cardiovascular status: blood pressure returned to baseline and stable Postop Assessment: no apparent nausea or vomiting Anesthetic complications: no    Last Vitals:  Vitals:   08/09/17 1200 08/09/17 1215  BP: (!) 153/90 (!) 150/86  Pulse: (!) 56 (!) 51  Resp: 16 16  Temp:    SpO2: 100% 100%    Last Pain:  Vitals:   08/09/17 1215  TempSrc:   PainSc: 5    Pain Goal: Patients Stated Pain Goal: 3 (08/09/17 1003)               Hyun Marsalis A.

## 2017-08-09 NOTE — Anesthesia Procedure Notes (Signed)
Procedure Name: MAC Date/Time: 08/09/2017 11:04 AM Performed by: Hewitt Blade Pre-anesthesia Checklist: Patient identified, Emergency Drugs available, Suction available, Patient being monitored and Timeout performed Patient Re-evaluated:Patient Re-evaluated prior to induction Oxygen Delivery Method: Circle system utilized Preoxygenation: Pre-oxygenation with 100% oxygen Induction Type: IV induction Ventilation: Mask ventilation without difficulty Placement Confirmation: positive ETCO2 and breath sounds checked- equal and bilateral Dental Injury: Teeth and Oropharynx as per pre-operative assessment

## 2017-08-09 NOTE — Transfer of Care (Signed)
Immediate Anesthesia Transfer of Care Note  Patient: Amy Roman  Procedure(s) Performed: DILATATION & CURETTAGE/HYSTEROSCOPY WITH MYOSURE (N/A Vagina )  Patient Location: PACU  Anesthesia Type:MAC  Level of Consciousness: awake, alert  and oriented  Airway & Oxygen Therapy: Patient Spontanous Breathing and Patient connected to nasal cannula oxygen  Post-op Assessment: Report given to RN and Post -op Vital signs reviewed and stable  Post vital signs: Reviewed and stable  Last Vitals:  Vitals:   08/09/17 1003  BP: 131/86  Pulse: (!) 58  Resp: 16  Temp: 36.9 C  SpO2: 100%    Last Pain:  Vitals:   08/09/17 1003  TempSrc: Oral  PainSc: 5       Patients Stated Pain Goal: 3 (95/63/87 5643)  Complications: No apparent anesthesia complications

## 2017-08-09 NOTE — H&P (Signed)
Amy Roman is an 67 y.o. widowed [place] P4 here for a d&c, hysteroscopy due to a 4 month h/o PMB. Her u/s showed the following: IMPRESSION: 1. Bilayer endometrial thickness 6 mm. In the setting of post-menopausal bleeding, endometrial sampling is indicated to exclude carcinoma. If results are benign, sonohysterogram should be considered for focal lesion work-up. (Ref: Radiological Reasoning: Algorithmic Workup of Abnormal Vaginal Bleeding with Endovaginal Sonography and Sonohysterography. AJR 2008; 195:K93-26). 2. No uterine fibroids. 3. Normal ovaries.  No adnexal masses.     No LMP recorded. Patient is postmenopausal.    Past Medical History:  Diagnosis Date  . Anxiety and depression 10/15/2014  . Arthritis    BILATERAL KNEES, HAD CORTISONE INJECTION 03/2017  . Depression   . GERD (gastroesophageal reflux disease)    OCCASIONAL WITH CERTAIN FOOD  . Headache   . Unspecified hereditary and idiopathic peripheral neuropathy 05/14/2014   LEFT KNEE AND FOOT    Past Surgical History:  Procedure Laterality Date  . BREAST SURGERY     bx, no cancer   . CATARACT EXTRACTION Bilateral     Family History  Problem Relation Age of Onset  . Diabetes Mother   . Neuropathy Mother   . Neuropathy Sister   . Other Father   . Colon cancer Neg Hx   . Breast cancer Neg Hx     Social History:  reports that she has never smoked. She has never used smokeless tobacco. She reports that she does not drink alcohol or use drugs.  Allergies: No Known Allergies  Prescriptions Prior to Admission  Medication Sig Dispense Refill Last Dose  . acetaminophen (TYLENOL) 650 MG CR tablet Take 650 mg by mouth every 8 (eight) hours as needed for pain.   08/08/2017 at Unknown time  . b complex vitamins tablet Take 1 tablet by mouth daily.   Past Week at Unknown time  . famotidine (PEPCID) 10 MG tablet Take 10 mg by mouth 2 (two) times daily. AS NEEDED   08/08/2017 at 1900  . gabapentin  (NEURONTIN) 800 MG tablet Take 400 mg by mouth at bedtime.   08/08/2017 at 1900  . temazepam (RESTORIL) 30 MG capsule Take 30 mg by mouth at bedtime as needed for sleep. Pt takes for anxiety   Past Week at Unknown time  . megestrol (MEGACE) 40 MG tablet Take 1 tablet (40 mg total) by mouth 2 (two) times daily. (Patient not taking: Reported on 07/24/2017) 60 tablet 3 Not Taking at Unknown time    ROS  Blood pressure 131/86, pulse (!) 58, temperature 98.4 F (36.9 C), temperature source Oral, resp. rate 16, SpO2 100 %. Physical Exam Video interpretor used for this encounter. Heart- rrr Lungs- CTAB Abd- benign  Results for orders placed or performed during the hospital encounter of 08/08/17 (from the past 24 hour(s))  CBC     Status: None   Collection Time: 08/08/17 11:00 AM  Result Value Ref Range   WBC 5.0 4.0 - 10.5 K/uL   RBC 4.26 3.87 - 5.11 MIL/uL   Hemoglobin 12.8 12.0 - 15.0 g/dL   HCT 38.8 36.0 - 46.0 %   MCV 91.1 78.0 - 100.0 fL   MCH 30.0 26.0 - 34.0 pg   MCHC 33.0 30.0 - 36.0 g/dL   RDW 13.9 11.5 - 15.5 %   Platelets 235 150 - 400 K/uL    No results found.  Assessment/Plan: PMB- plan for d&c, hysteroscopy  She understands the risks of surgery, including,  but not to infection, bleeding, DVTs, damage to bowel, bladder, ureters. She wishes to proceed.     Alda Gaultney C Nevaeha Finerty 08/09/2017, 10:50 AM

## 2017-08-09 NOTE — Op Note (Addendum)
08/09/2017  11:34 AM  PATIENT:  Amy Roman  65 y.o. female  PRE-OPERATIVE DIAGNOSIS:  Postmenopausal bleeding  POST-OPERATIVE DIAGNOSIS:  same  PROCEDURE:  Procedure(s): DILATATION & CURETTAGE/HYSTEROSCOPY WITH MYOSURE (N/A)  SURGEON:  Surgeon(s) and Role:    * Shelia Magallon C, MD - Primary  PHYSICIAN ASSISTANT:   ASSISTANTS: none   ANESTHESIA:   paracervical block and MAC  EBL:  10 mL   BLOOD ADMINISTERED:none  DRAINS: none   LOCAL MEDICATIONS USED:  MARCAINE     SPECIMEN:  Source of Specimen:  uterine curettings  DISPOSITION OF SPECIMEN:  PATHOLOGY  COUNTS:  YES  TOURNIQUET:  * No tourniquets in log *  DICTATION: .Dragon Dictation  PLAN OF CARE: Discharge to home after PACU  PATIENT DISPOSITION:  PACU - hemodynamically stable.   Delay start of Pharmacological VTE agent (>24hrs) due to surgical blood loss or risk of bleeding: not applicable   The risks, benefits, and alternatives of surgery were explained, understood, and accepted. All questions were answered. Consents were signed. In the operating room MAC anesthesia was applied without complication, and she was placed in the dorsal lithotomy position. Her vagina was prepped and draped in the usual sterile fashion. A female circumcision was noted.  A bimanual exam revealed a normal size and shape, anteverted mobile uterus. Her adnexa were nonenlarged. A small speculum was placed and a single-tooth tenaculum was used to grasp the anterior lip of her cervix. A total of 30 mL of 0.5% Marcaine was used to perform a paracervical block. Her uterus sounded to 9 cm. Her cervix was carefully and slowly dilated to accommodate a hysteroscope. Hysteroscopy revealed a polyp area at the right fundus. The remainder of the uterus was atrophic. The Reach myosure device was used to removed the polyp area. I then did a curettage was done in all quadrants and the fundus of the uterus.  A gritty sensation was appreciated  throughout. There was no bleeding noted at the end of the case. She was taken to the recovery room after being extubated. She tolerated the procedure well.

## 2017-08-09 NOTE — Anesthesia Preprocedure Evaluation (Signed)
Anesthesia Evaluation  Patient identified by MRN, date of birth, ID band Patient awake    Reviewed: Allergy & Precautions, NPO status , Patient's Chart, lab work & pertinent test results  Airway Mallampati: III  TM Distance: >3 FB Neck ROM: Full    Dental no notable dental hx. (+) Teeth Intact   Pulmonary neg pulmonary ROS,    Pulmonary exam normal breath sounds clear to auscultation       Cardiovascular negative cardio ROS Normal cardiovascular exam Rhythm:Regular Rate:Normal     Neuro/Psych  Headaches, PSYCHIATRIC DISORDERS Anxiety Depression Peripheral neuropathy  Neuromuscular disease    GI/Hepatic Neg liver ROS, GERD  Medicated and Controlled,  Endo/Other  Obesity  Renal/GU negative Renal ROS  negative genitourinary   Musculoskeletal  (+) Arthritis , Osteoarthritis,    Abdominal (+) + obese,   Peds  Hematology negative hematology ROS (+)   Anesthesia Other Findings   Reproductive/Obstetrics DUB                             Anesthesia Physical Anesthesia Plan  ASA: II  Anesthesia Plan: General   Post-op Pain Management:    Induction: Intravenous  PONV Risk Score and Plan: 4 or greater and Ondansetron, Dexamethasone, Midazolam, Propofol infusion, Metaclopromide and Treatment may vary due to age or medical condition  Airway Management Planned: LMA  Additional Equipment:   Intra-op Plan:   Post-operative Plan: Extubation in OR  Informed Consent: I have reviewed the patients History and Physical, chart, labs and discussed the procedure including the risks, benefits and alternatives for the proposed anesthesia with the patient or authorized representative who has indicated his/her understanding and acceptance.   Dental advisory given  Plan Discussed with: CRNA, Anesthesiologist and Surgeon  Anesthesia Plan Comments:         Anesthesia Quick Evaluation

## 2017-08-09 NOTE — Discharge Instructions (Signed)
Dilation and Curettage or Vacuum Curettage, Care After This sheet gives you information about how to care for yourself after your procedure. Your health care provider may also give you more specific instructions. If you have problems or questions, contact your health care provider. What can I expect after the procedure? After your procedure, it is common to have:  Mild pain or cramping.  Some vaginal bleeding or spotting.  These may last for up to 2 weeks after your procedure. Follow these instructions at home: Activity   Do not drive or use heavy machinery while taking prescription pain medicine.  Avoid driving for the first 24 hours after your procedure.  Take frequent, short walks, followed by rest periods, throughout the day. Ask your health care provider what activities are safe for you. After 1-2 days, you may be able to return to your normal activities.  Do not lift anything heavier than 10 lb (4.5 kg) until your health care provider approves.  For at least 2 weeks, or as long as told by your health care provider, do not: ? Douche. ? Use tampons. ? Have sexual intercourse. General instructions   Take over-the-counter and prescription medicines only as told by your health care provider. This is especially important if you take blood thinning medicine.  Do not take baths, swim, or use a hot tub until your health care provider approves. Take showers instead of baths.  Wear compression stockings as told by your health care provider. These stockings help to prevent blood clots and reduce swelling in your legs.  It is your responsibility to get the results of your procedure. Ask your health care provider, or the department performing the procedure, when your results will be ready.  Keep all follow-up visits as told by your health care provider. This is important. Contact a health care provider if:  You have severe cramps that get worse or that do not get better with  medicine.  You have severe abdominal pain.  You cannot drink fluids without vomiting.  You develop pain in a different area of your pelvis.  You have bad-smelling vaginal discharge.  You have a rash. Get help right away if:  You have vaginal bleeding that soaks more than one sanitary pad in 1 hour, for 2 hours in a row.  You pass large blood clots from your vagina.  You have a fever that is above 100.74F (38.0C).  Your abdomen feels very tender or hard.  You have chest pain.  You have shortness of breath.  You cough up blood.  You feel dizzy or light-headed.  You faint.  You have pain in your neck or shoulder area. This information is not intended to replace advice given to you by your health care provider. Make sure you discuss any questions you have with your health care provider. Document Released: 10/07/2000 Document Revised: 06/08/2016 Document Reviewed: 05/12/2016 Elsevier Interactive Patient Education  2018 [place]: D&C / D&E The following instructions have been prepared to help you care for yourself upon your return home.   Personal hygiene:  Use sanitary pads for vaginal drainage, not tampons.  Shower the day after your procedure.  NO tub baths, pools or Jacuzzis for 2-3 weeks.  Wipe front to back after using the bathroom.  Activity and limitations:  Do NOT drive or operate any equipment for 24 hours. The effects of anesthesia are still present and drowsiness may result.  Do NOT rest in bed all day.  Walking is encouraged.  Walk up and down stairs slowly.  You may resume your normal activity in one to two days or as indicated by your physician.  Sexual activity: NO intercourse for at least 2 weeks after the procedure, or as indicated by your physician.  Diet: Eat a light meal as desired this evening. You may resume your usual diet tomorrow.  Return to work: You may resume your work activities in one to two  days or as indicated by your doctor.  What to expect after your surgery: Expect to have vaginal bleeding/discharge for 2-3 days and spotting for up to 10 days. It is not unusual to have soreness for up to 1-2 weeks. You may have a slight burning sensation when you urinate for the first day. Mild cramps may continue for a couple of days. You may have a regular period in 2-6 weeks.  Call your doctor for any of the following:  Excessive vaginal bleeding, saturating and changing one pad every hour.  Inability to urinate 6 hours after discharge from hospital.  Pain not relieved by pain medication.  Fever of 100.4 F or greater.  Unusual vaginal discharge or odor.   Call for an appointment:    Patients signature: ______________________  Nurses signature ________________________  Support person's signature_______________________    Post Anesthesia Home Care Instructions  Activity: Get plenty of rest for the remainder of the day. A responsible individual must stay with you for 24 hours following the procedure.  For the next 24 hours, DO NOT: -Drive a car -Paediatric nurse -Drink alcoholic beverages -Take any medication unless instructed by your physician -Make any legal decisions or sign important papers.  Meals: Start with liquid foods such as gelatin or soup. Progress to regular foods as tolerated. Avoid greasy, spicy, heavy foods. If nausea and/or vomiting occur, drink only clear liquids until the nausea and/or vomiting subsides. Call your physician if vomiting continues.  Special Instructions/Symptoms: Your throat may feel dry or sore from the anesthesia or the breathing tube placed in your throat during surgery. If this causes discomfort, gargle with warm salt water. The discomfort should disappear within 24 hours.  If you had a scopolamine patch placed behind your ear for the management of post- operative nausea and/or vomiting:  1. The medication in the patch is  effective for 72 hours, after which it should be removed.  Wrap patch in a tissue and discard in the trash. Wash hands thoroughly with soap and water. 2. You may remove the patch earlier than 72 hours if you experience unpleasant side effects which may include dry mouth, dizziness or visual disturbances. 3. Avoid touching the patch. Wash your hands with soap and water after contact with the patch.

## 2017-08-10 ENCOUNTER — Encounter (HOSPITAL_COMMUNITY): Payer: Self-pay | Admitting: Obstetrics & Gynecology

## 2017-08-31 DIAGNOSIS — F330 Major depressive disorder, recurrent, mild: Secondary | ICD-10-CM | POA: Diagnosis not present

## 2017-09-06 DIAGNOSIS — F330 Major depressive disorder, recurrent, mild: Secondary | ICD-10-CM | POA: Diagnosis not present

## 2017-09-25 ENCOUNTER — Ambulatory Visit: Payer: Self-pay | Admitting: Obstetrics & Gynecology

## 2017-10-05 DIAGNOSIS — F330 Major depressive disorder, recurrent, mild: Secondary | ICD-10-CM | POA: Diagnosis not present

## 2017-10-11 ENCOUNTER — Encounter: Payer: Self-pay | Admitting: Obstetrics & Gynecology

## 2017-10-11 ENCOUNTER — Ambulatory Visit (INDEPENDENT_AMBULATORY_CARE_PROVIDER_SITE_OTHER): Payer: Medicare Other | Admitting: Obstetrics & Gynecology

## 2017-10-11 VITALS — BP 105/58 | HR 105 | Wt 195.7 lb

## 2017-10-11 DIAGNOSIS — Z9889 Other specified postprocedural states: Secondary | ICD-10-CM

## 2017-10-11 DIAGNOSIS — N8501 Benign endometrial hyperplasia: Secondary | ICD-10-CM | POA: Diagnosis present

## 2017-10-11 DIAGNOSIS — Z3202 Encounter for pregnancy test, result negative: Secondary | ICD-10-CM

## 2017-10-11 DIAGNOSIS — N852 Hypertrophy of uterus: Secondary | ICD-10-CM | POA: Insufficient documentation

## 2017-10-11 MED ORDER — MEDROXYPROGESTERONE ACETATE 150 MG/ML IM SUSP
150.0000 mg | Freq: Once | INTRAMUSCULAR | Status: AC
  Administered 2017-10-11: 150 mg via INTRAMUSCULAR

## 2017-10-11 NOTE — Progress Notes (Signed)
Video Interpreter # [phone]

## 2017-10-11 NOTE — Progress Notes (Signed)
   Subjective:    Patient ID: Amy Roman, female    DOB: Sep 26, 1951, 66 y.o.   MRN: [id number]  HPI 66 yo Arabic-speaking woman here for a post op visit. She had a d&c for PMB about 2 months ago. Her pathology showed the following: Diagnosis Endometrium, curettage ENDOMETRIUM POLYP WITH FOCUS OF SIMPLE AND COMPLEX HYPERPLASIA WITHOUT ATYPIA BACKGROUND ENDOMETRIUM SHOWS HORMONE EFFECT NEGATIVE FOR MALIGNANCY  Review of Systems     Objective:   Physical Exam  Video interpretor used for this visit. 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 She declines surgery (hysterectomy) and would prefer an injection over daily medications. She will get a depo provera shot today, another in 3 months, and then an EMBX. She understands the plan.

## 2017-10-24 DIAGNOSIS — C50919 Malignant neoplasm of unspecified site of unspecified female breast: Secondary | ICD-10-CM

## 2017-10-24 HISTORY — DX: Malignant neoplasm of unspecified site of unspecified female breast: C50.919

## 2017-10-24 HISTORY — PX: BREAST LUMPECTOMY: SHX2

## 2017-12-25 ENCOUNTER — Encounter: Payer: Self-pay | Admitting: Obstetrics & Gynecology

## 2017-12-25 ENCOUNTER — Ambulatory Visit (INDEPENDENT_AMBULATORY_CARE_PROVIDER_SITE_OTHER): Payer: Medicare Other | Admitting: Obstetrics & Gynecology

## 2017-12-25 VITALS — BP 132/75 | HR 78

## 2017-12-25 DIAGNOSIS — N8501 Benign endometrial hyperplasia: Secondary | ICD-10-CM

## 2017-12-25 NOTE — Progress Notes (Signed)
   Subjective:    Patient ID: Amy Roman, female    DOB: 28-Nov-1950, 67 y.o.   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   Review of Systems     Objective:   Physical Exam  Breathing, conversing, and ambulating normally Well nourished, well hydrated [place] female, no apparent distress Live interpretor present for encounter "my soul is not ready for an exam today"       Assessment & Plan:  Need to determine route of hysterectomy She is sure that she will be ready for a vaginal exam in 2 weeks.

## 2017-12-25 NOTE — Progress Notes (Signed)
Pt stated having regular bleeding since end of January. Denied pain, and fever. Pt stated having symptoms tired all the time.

## 2017-12-27 ENCOUNTER — Ambulatory Visit: Payer: Self-pay

## 2018-01-12 ENCOUNTER — Encounter: Payer: Self-pay | Admitting: Obstetrics & Gynecology

## 2018-01-12 ENCOUNTER — Encounter (HOSPITAL_COMMUNITY): Payer: Self-pay

## 2018-01-12 ENCOUNTER — Ambulatory Visit (INDEPENDENT_AMBULATORY_CARE_PROVIDER_SITE_OTHER): Payer: Medicare Other | Admitting: Obstetrics & Gynecology

## 2018-01-12 ENCOUNTER — Telehealth: Payer: Self-pay | Admitting: *Deleted

## 2018-01-12 VITALS — BP 140/74 | HR 66 | Wt 195.2 lb

## 2018-01-12 DIAGNOSIS — Z1231 Encounter for screening mammogram for malignant neoplasm of breast: Secondary | ICD-10-CM

## 2018-01-12 DIAGNOSIS — Z1239 Encounter for other screening for malignant neoplasm of breast: Secondary | ICD-10-CM

## 2018-01-12 DIAGNOSIS — Z0000 Encounter for general adult medical examination without abnormal findings: Secondary | ICD-10-CM

## 2018-01-12 NOTE — Addendum Note (Signed)
Addended by: Riccardo Dubin on: 01/12/2018 12:12 PM   Modules accepted: Orders

## 2018-01-12 NOTE — Telephone Encounter (Signed)
Called patient using Arabic interpreter [phone]. Message was left on voice mail stating her mammogram appointment has been scheduled for 01/30/18 @3 :20. If any questions she may call us back at the clinic.

## 2018-01-12 NOTE — Progress Notes (Signed)
   Subjective:    Patient ID: Amy Roman, female    DOB: 1951-10-13, 67 y.o.   MRN: [id number]  HPI  67 yo [place] lady here for a vaginal exam to see if her hysterectomy can be done from the vaginal approach.   Review of Systems She had a female circumcision in the [place]. She has had 4 vaginal deliveries.    Objective:   Physical Exam Breathing, conversing, and ambulating normally Well nourished, well hydrated African female, no apparent distress She does have a female circumcision Her pubic arch is moderate and the uterus is small I feel that a TVH is possible.     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 She declines progestin treatment and wants her uterus removed. I will send Jordan an email to schedule this

## 2018-01-30 ENCOUNTER — Ambulatory Visit
Admission: RE | Admit: 2018-01-30 | Discharge: 2018-01-30 | Disposition: A | Discharge status: Home / Self Care | Payer: Medicare Other | Source: Ambulatory Visit | Attending: Obstetrics & Gynecology | Admitting: Obstetrics & Gynecology

## 2018-01-30 DIAGNOSIS — Z1239 Encounter for other screening for malignant neoplasm of breast: Secondary | ICD-10-CM

## 2018-01-30 DIAGNOSIS — Z1231 Encounter for screening mammogram for malignant neoplasm of breast: Secondary | ICD-10-CM | POA: Diagnosis not present

## 2018-01-30 DIAGNOSIS — Z0000 Encounter for general adult medical examination without abnormal findings: Secondary | ICD-10-CM

## 2018-01-31 DIAGNOSIS — F330 Major depressive disorder, recurrent, mild: Secondary | ICD-10-CM | POA: Diagnosis not present

## 2018-02-01 ENCOUNTER — Other Ambulatory Visit (HOSPITAL_COMMUNITY): Payer: Self-pay | Admitting: Obstetrics & Gynecology

## 2018-02-01 DIAGNOSIS — R928 Other abnormal and inconclusive findings on diagnostic imaging of breast: Secondary | ICD-10-CM

## 2018-02-07 ENCOUNTER — Other Ambulatory Visit: Payer: Self-pay

## 2018-02-14 ENCOUNTER — Ambulatory Visit
Admission: RE | Admit: 2018-02-14 | Discharge: 2018-02-14 | Disposition: A | Discharge status: Home / Self Care | Payer: Medicare Other | Source: Ambulatory Visit | Attending: Obstetrics & Gynecology | Admitting: Obstetrics & Gynecology

## 2018-02-14 ENCOUNTER — Other Ambulatory Visit (HOSPITAL_COMMUNITY): Payer: Self-pay | Admitting: Obstetrics & Gynecology

## 2018-02-14 DIAGNOSIS — R928 Other abnormal and inconclusive findings on diagnostic imaging of breast: Secondary | ICD-10-CM

## 2018-02-14 DIAGNOSIS — R922 Inconclusive mammogram: Secondary | ICD-10-CM | POA: Diagnosis not present

## 2018-02-14 DIAGNOSIS — N6310 Unspecified lump in the right breast, unspecified quadrant: Secondary | ICD-10-CM

## 2018-02-14 DIAGNOSIS — N651 Disproportion of reconstructed breast: Secondary | ICD-10-CM | POA: Diagnosis not present

## 2018-02-15 ENCOUNTER — Other Ambulatory Visit (HOSPITAL_COMMUNITY): Payer: Self-pay | Admitting: Obstetrics & Gynecology

## 2018-02-15 DIAGNOSIS — N6310 Unspecified lump in the right breast, unspecified quadrant: Secondary | ICD-10-CM

## 2018-02-16 ENCOUNTER — Ambulatory Visit
Admission: RE | Admit: 2018-02-16 | Discharge: 2018-02-16 | Disposition: A | Discharge status: Home / Self Care | Payer: Medicare Other | Source: Ambulatory Visit | Attending: Obstetrics & Gynecology | Admitting: Obstetrics & Gynecology

## 2018-02-16 DIAGNOSIS — N6310 Unspecified lump in the right breast, unspecified quadrant: Secondary | ICD-10-CM

## 2018-02-16 DIAGNOSIS — N6311 Unspecified lump in the right breast, upper outer quadrant: Secondary | ICD-10-CM | POA: Diagnosis not present

## 2018-02-16 DIAGNOSIS — C50811 Malignant neoplasm of overlapping sites of right female breast: Secondary | ICD-10-CM | POA: Diagnosis not present

## 2018-02-16 DIAGNOSIS — C50511 Malignant neoplasm of lower-outer quadrant of right female breast: Secondary | ICD-10-CM | POA: Diagnosis not present

## 2018-02-16 DIAGNOSIS — N6313 Unspecified lump in the right breast, lower outer quadrant: Secondary | ICD-10-CM | POA: Diagnosis not present

## 2018-02-16 DIAGNOSIS — Z170 Estrogen receptor positive status [ER+]: Secondary | ICD-10-CM | POA: Diagnosis not present

## 2018-02-21 DIAGNOSIS — Z9289 Personal history of other medical treatment: Secondary | ICD-10-CM

## 2018-02-21 HISTORY — DX: Personal history of other medical treatment: Z92.89

## 2018-02-23 ENCOUNTER — Ambulatory Visit (INDEPENDENT_AMBULATORY_CARE_PROVIDER_SITE_OTHER): Payer: Self-pay | Admitting: Orthopaedic Surgery

## 2018-02-23 DIAGNOSIS — C50511 Malignant neoplasm of lower-outer quadrant of right female breast: Secondary | ICD-10-CM | POA: Diagnosis not present

## 2018-02-26 ENCOUNTER — Telehealth (INDEPENDENT_AMBULATORY_CARE_PROVIDER_SITE_OTHER): Payer: Self-pay | Admitting: Radiology

## 2018-02-26 ENCOUNTER — Ambulatory Visit (INDEPENDENT_AMBULATORY_CARE_PROVIDER_SITE_OTHER): Payer: Medicare Other

## 2018-02-26 ENCOUNTER — Encounter (INDEPENDENT_AMBULATORY_CARE_PROVIDER_SITE_OTHER): Payer: Self-pay | Admitting: Orthopaedic Surgery

## 2018-02-26 ENCOUNTER — Ambulatory Visit (INDEPENDENT_AMBULATORY_CARE_PROVIDER_SITE_OTHER): Payer: Medicare Other | Admitting: Orthopaedic Surgery

## 2018-02-26 DIAGNOSIS — M1711 Unilateral primary osteoarthritis, right knee: Secondary | ICD-10-CM | POA: Diagnosis not present

## 2018-02-26 DIAGNOSIS — M1712 Unilateral primary osteoarthritis, left knee: Secondary | ICD-10-CM

## 2018-02-26 NOTE — Telephone Encounter (Signed)
Noted  

## 2018-02-26 NOTE — Progress Notes (Signed)
Office Visit Note   Patient: Amy Roman           Date of Birth: May 18, 1951           MRN: [id number] Visit Date: 02/26/2018              Requested by: Lin Landsman, [address] [place], [place] [id number] PCP: Lin Landsman, MD   Assessment & Plan: Visit Diagnoses:  1. Primary osteoarthritis of right knee   2. Unilateral primary osteoarthritis, left knee     Plan: Impression is advanced degenerative joint disease bilateral knees with varus deformity.  We discussed permanent solution of knee replacement surgery and the associated risks and benefits.  At this point patient has other medical issues going on and I do not think it is appropriate to undergo knee replacement.  We will submit request for HA injections for both her knees at this point.  Questions encouraged and answered.  Today's encounter was performed through an interpreter.  Follow-Up Instructions: Return if symptoms worsen or fail to improve.   Orders:  Orders Placed This Encounter  Procedures  . XR Knee 1-2 Views Right  . XR Knee 1-2 Views Left   No orders of the defined types were placed in this encounter.     Procedures: No procedures performed   Clinical Data: No additional findings.   Subjective: Chief Complaint  Patient presents with  . Left Knee - Pain  . Right Knee - Pain    Patient is a very pleasant 67 year old [place] female who comes in with chronic bilateral knee pain worse on the right.  She has frequent falls secondary to her knee pain.  She has had cortisone injections last year without any significant relief.  She has difficulty with ADLs and functional limitations related to her knees.  Denies any back pain or numbness and tingling.   Review of Systems  Constitutional: Negative.   HENT: Negative.   Eyes: Negative.   Respiratory: Negative.   Cardiovascular: Negative.   Endocrine: Negative.   Musculoskeletal: Negative.   Neurological: Negative.   Hematological:  Negative.   Psychiatric/Behavioral: Negative.   All other systems reviewed and are negative.    Objective: Vital Signs: There were no vitals taken for this visit.  Physical Exam  Constitutional: She is oriented to person, place, and time. She appears well-developed and well-nourished.  HENT:  Head: Normocephalic and atraumatic.  Eyes: EOM are normal.  Neck: Neck supple.  Pulmonary/Chest: Effort normal.  Abdominal: Soft.  Neurological: She is alert and oriented to person, place, and time.  Skin: Skin is warm. Capillary refill takes less than 2 seconds.  Psychiatric: She has a normal mood and affect. Her behavior is normal. Judgment and thought content normal.  Nursing note and vitals reviewed.   Ortho Exam Bilateral knee exam shows no joint effusion.  Collaterals and cruciates are stable.  1+ patellofemoral crepitus bilaterally. Specialty Comments:  No specialty comments available.  Imaging: Xr Knee 1-2 Views Left  Result Date: 02/26/2018 Advanced degenerative joint disease with varus deformity and tibial femoral subluxation  Xr Knee 1-2 Views Right  Result Date: 02/26/2018 Advanced degenerative joint disease with varus deformity and tibial femoral subluxation    PMFS History: Patient Active Problem List   Diagnosis Date Noted  . Uterine hyperplasia 10/11/2017  . Anxiety and depression 10/15/2014  . Vitamin B 12 deficiency 06/02/2014  . Vitamin D deficiency 06/02/2014  . Hereditary and idiopathic peripheral neuropathy 05/14/2014  Past Medical History:  Diagnosis Date  . Anxiety and depression 10/15/2014  . Arthritis    BILATERAL KNEES, HAD CORTISONE INJECTION 03/2017  . Depression   . GERD (gastroesophageal reflux disease)    OCCASIONAL WITH CERTAIN FOOD  . Headache   . Unspecified hereditary and idiopathic peripheral neuropathy 05/14/2014   LEFT KNEE AND FOOT    Family History  Problem Relation Age of Onset  . Diabetes Mother   . Neuropathy Mother   .  Neuropathy Sister   . Other Father   . Colon cancer Neg Hx   . Breast cancer Neg Hx     Past Surgical History:  Procedure Laterality Date  . BREAST EXCISIONAL BIOPSY Right   . BREAST SURGERY     bx, no cancer   . CATARACT EXTRACTION Bilateral   . DILATATION & CURETTAGE/HYSTEROSCOPY WITH MYOSURE N/A 08/09/2017   Procedure: DILATATION & CURETTAGE/HYSTEROSCOPY WITH MYOSURE;  Surgeon: Emily Filbert, MD;  Location: [facility];  Service: Gynecology;  Laterality: N/A;   Social History   Occupational History  . Occupation: house   Tobacco Use  . Smoking status: Never Smoker  . Smokeless tobacco: Never Used  Substance and Sexual Activity  . Alcohol use: No  . Drug use: No  . Sexual activity: Not on file

## 2018-02-26 NOTE — Telephone Encounter (Signed)
Dr. Erlinda Hong would like for patient to have bilateral knee monovisc injections.  Thanks.

## 2018-02-27 ENCOUNTER — Other Ambulatory Visit: Payer: Self-pay | Admitting: General Surgery

## 2018-02-27 ENCOUNTER — Telehealth: Payer: Self-pay | Admitting: Nurse Practitioner

## 2018-02-27 ENCOUNTER — Encounter: Payer: Self-pay | Admitting: Nurse Practitioner

## 2018-02-27 ENCOUNTER — Telehealth (INDEPENDENT_AMBULATORY_CARE_PROVIDER_SITE_OTHER): Payer: Self-pay

## 2018-02-27 DIAGNOSIS — C50511 Malignant neoplasm of lower-outer quadrant of right female breast: Secondary | ICD-10-CM

## 2018-02-27 NOTE — Telephone Encounter (Signed)
Appt has been scheduled for the pt to see Wilnette Kales on 5/20 at 2pm. Appt date and time given to the pt's son who requests that the pt has female providers only. Letter mailed.

## 2018-02-27 NOTE — Telephone Encounter (Signed)
Mailed [place] Patient Assistance application for patient to complete and return to the office.

## 2018-02-28 ENCOUNTER — Encounter (HOSPITAL_COMMUNITY): Payer: Self-pay

## 2018-02-28 ENCOUNTER — Other Ambulatory Visit: Payer: Self-pay

## 2018-02-28 ENCOUNTER — Encounter (HOSPITAL_COMMUNITY)
Admission: RE | Admit: 2018-02-28 | Discharge: 2018-02-28 | Disposition: A | Discharge status: Home / Self Care | Payer: Medicare Other | Source: Ambulatory Visit | Attending: Obstetrics & Gynecology | Admitting: Obstetrics & Gynecology

## 2018-02-28 DIAGNOSIS — Z01812 Encounter for preprocedural laboratory examination: Secondary | ICD-10-CM | POA: Insufficient documentation

## 2018-02-28 DIAGNOSIS — Z0183 Encounter for blood typing: Secondary | ICD-10-CM | POA: Diagnosis not present

## 2018-02-28 LAB — TYPE AND SCREEN
ABO/RH(D): O POS
Antibody Screen: NEGATIVE

## 2018-02-28 LAB — CBC
HCT: 36.5 % (ref 36.0–46.0)
HEMOGLOBIN: 12 g/dL (ref 12.0–15.0)
MCH: 28.9 pg (ref 26.0–34.0)
MCHC: 32.9 g/dL (ref 30.0–36.0)
MCV: 88 fL (ref 78.0–100.0)
PLATELETS: 220 10*3/uL (ref 150–400)
RBC: 4.15 MIL/uL (ref 3.87–5.11)
RDW: 14.9 % (ref 11.5–15.5)
WBC: 4.1 10*3/uL (ref 4.0–10.5)

## 2018-02-28 LAB — BASIC METABOLIC PANEL
Anion gap: 11 (ref 5–15)
BUN: 17 mg/dL (ref 6–20)
CHLORIDE: 102 mmol/L (ref 101–111)
CO2: 23 mmol/L (ref 22–32)
CREATININE: 0.8 mg/dL (ref 0.44–1.00)
Calcium: 9.4 mg/dL (ref 8.9–10.3)
GFR calc Af Amer: >60 mL/min (ref >60)
GFR calc non Af Amer: >60 mL/min (ref >60)
Glucose, Bld: 107 mg/dL — ABNORMAL HIGH (ref 65–99)
Potassium: 4.3 mmol/L (ref 3.5–5.1)
SODIUM: 136 mmol/L (ref 135–145)

## 2018-02-28 LAB — ABO/RH: ABO/RH(D): O POS

## 2018-02-28 NOTE — Pre-Procedure Instructions (Signed)
PATIENT DOES NOT WANT FAMILY TO KNOW ABOUT DIAGNOSIS OF BREAST CANCER

## 2018-02-28 NOTE — Patient Instructions (Addendum)
Your procedure is scheduled on: Tuesday Mar 06, 2018 at 7:30 am  Enter through the Main Entrance of [facility] at:6:00 am  Pick up the phone at the desk and dial [phone].  Call this number if you have problems the morning of surgery: [phone].  Remember: Do NOT eat food or drink any liquids after: Midnight on Monday May 13  Take these medicines the morning of surgery with a SIP OF WATER: PEPCID, GABAPENTIN, FLUOXETINE   STOP ALL VITAMINS, HERBAL MEDICATIONS, SUPPLEMENTS 1 WEEK PRIOR TO SURGERY  DO NOT SMOKE DAY OF SURGERY  Do NOT wear jewelry (body piercing), metal hair clips/bobby pins, make-up, or nail polish. Do NOT wear lotions, powders, or perfumes.  You may wear deoderant. Do NOT shave for 48 hours prior to surgery. Do NOT bring valuables to the hospital. Contacts, dentures, or bridgework may not be worn into surgery. Leave suitcase in car.  After surgery it may be brought to your room.    For patients admitted to the hospital, checkout time is 11:00 AM the day of discharge.

## 2018-03-01 ENCOUNTER — Ambulatory Visit
Admission: RE | Admit: 2018-03-01 | Discharge: 2018-03-01 | Disposition: A | Discharge status: Home / Self Care | Payer: Medicare Other | Source: Ambulatory Visit | Attending: General Surgery | Admitting: General Surgery

## 2018-03-01 DIAGNOSIS — C50511 Malignant neoplasm of lower-outer quadrant of right female breast: Secondary | ICD-10-CM

## 2018-03-01 DIAGNOSIS — C50919 Malignant neoplasm of unspecified site of unspecified female breast: Secondary | ICD-10-CM | POA: Diagnosis not present

## 2018-03-01 MED ORDER — GADOBENATE DIMEGLUMINE 529 MG/ML IV SOLN
15.0000 mL | Freq: Once | INTRAVENOUS | Status: AC | PRN
  Administered 2018-03-01: 15 mL via INTRAVENOUS

## 2018-03-02 ENCOUNTER — Encounter: Payer: Self-pay | Admitting: Radiation Oncology

## 2018-03-02 ENCOUNTER — Other Ambulatory Visit: Payer: Self-pay | Admitting: General Surgery

## 2018-03-02 DIAGNOSIS — Z170 Estrogen receptor positive status [ER+]: Principal | ICD-10-CM

## 2018-03-02 DIAGNOSIS — C50511 Malignant neoplasm of lower-outer quadrant of right female breast: Secondary | ICD-10-CM

## 2018-03-06 ENCOUNTER — Observation Stay (HOSPITAL_COMMUNITY)
Admission: RE | Admit: 2018-03-06 | Discharge: 2018-03-07 | Disposition: A | Discharge status: Home / Self Care | Payer: Medicare Other | Source: Ambulatory Visit | Attending: Obstetrics & Gynecology | Admitting: Obstetrics & Gynecology

## 2018-03-06 ENCOUNTER — Encounter (HOSPITAL_COMMUNITY): Payer: Self-pay | Admitting: *Deleted

## 2018-03-06 ENCOUNTER — Encounter (HOSPITAL_COMMUNITY)
Admission: RE | Disposition: A | Discharge status: Home / Self Care | Payer: Self-pay | Source: Ambulatory Visit | Attending: Obstetrics & Gynecology

## 2018-03-06 ENCOUNTER — Ambulatory Visit (HOSPITAL_COMMUNITY): Payer: Medicare Other | Admitting: Certified Registered"

## 2018-03-06 ENCOUNTER — Other Ambulatory Visit: Payer: Self-pay

## 2018-03-06 ENCOUNTER — Encounter (HOSPITAL_COMMUNITY): Payer: Self-pay

## 2018-03-06 DIAGNOSIS — Z9889 Other specified postprocedural states: Secondary | ICD-10-CM

## 2018-03-06 DIAGNOSIS — F329 Major depressive disorder, single episode, unspecified: Secondary | ICD-10-CM | POA: Diagnosis not present

## 2018-03-06 DIAGNOSIS — K219 Gastro-esophageal reflux disease without esophagitis: Secondary | ICD-10-CM | POA: Diagnosis not present

## 2018-03-06 DIAGNOSIS — Z79899 Other long term (current) drug therapy: Secondary | ICD-10-CM | POA: Diagnosis not present

## 2018-03-06 DIAGNOSIS — N959 Unspecified menopausal and perimenopausal disorder: Secondary | ICD-10-CM | POA: Insufficient documentation

## 2018-03-06 DIAGNOSIS — N879 Dysplasia of cervix uteri, unspecified: Secondary | ICD-10-CM | POA: Diagnosis not present

## 2018-03-06 DIAGNOSIS — N852 Hypertrophy of uterus: Secondary | ICD-10-CM

## 2018-03-06 DIAGNOSIS — N888 Other specified noninflammatory disorders of cervix uteri: Secondary | ICD-10-CM | POA: Diagnosis not present

## 2018-03-06 DIAGNOSIS — N838 Other noninflammatory disorders of ovary, fallopian tube and broad ligament: Secondary | ICD-10-CM | POA: Diagnosis present

## 2018-03-06 HISTORY — PX: LABIOPLASTY: SHX1900

## 2018-03-06 HISTORY — DX: Other seasonal allergic rhinitis: J30.2

## 2018-03-06 HISTORY — PX: VAGINAL HYSTERECTOMY: SHX2639

## 2018-03-06 SURGERY — HYSTERECTOMY, VAGINAL
Anesthesia: General | Site: Vagina | Laterality: Bilateral

## 2018-03-06 MED ORDER — SCOPOLAMINE 1 MG/3DAYS TD PT72: 1.0000 | MEDICATED_PATCH | Freq: Once | TRANSDERMAL | Status: DC

## 2018-03-06 MED ORDER — SODIUM CHLORIDE 0.9 % IJ SOLN
INTRAMUSCULAR | Status: DC | PRN
  Administered 2018-03-06: 100 mL

## 2018-03-06 MED ORDER — METOCLOPRAMIDE HCL 5 MG/ML IJ SOLN: 10.0000 mg | Freq: Once | INTRAMUSCULAR | Status: DC | PRN

## 2018-03-06 MED ORDER — SUGAMMADEX SODIUM 200 MG/2ML IV SOLN
INTRAVENOUS | Status: AC
  Filled 2018-03-06: qty 2

## 2018-03-06 MED ORDER — CHLORHEXIDINE GLUCONATE CLOTH 2 % EX PADS: 6.0000 | MEDICATED_PAD | Freq: Once | CUTANEOUS | Status: DC

## 2018-03-06 MED ORDER — LIDOCAINE HCL (CARDIAC) PF 100 MG/5ML IV SOSY
PREFILLED_SYRINGE | INTRAVENOUS | Status: AC
  Filled 2018-03-06: qty 5

## 2018-03-06 MED ORDER — GABAPENTIN 100 MG PO CAPS
200.0000 mg | ORAL_CAPSULE | Freq: Two times a day (BID) | ORAL | Status: DC
  Administered 2018-03-06: 200 mg via ORAL
  Filled 2018-03-06 (×3): qty 2

## 2018-03-06 MED ORDER — LACTATED RINGERS IV SOLN
INTRAVENOUS | Status: DC
  Administered 2018-03-06 (×2): via INTRAVENOUS

## 2018-03-06 MED ORDER — CEFAZOLIN SODIUM-DEXTROSE 2-4 GM/100ML-% IV SOLN: 2.0000 g | INTRAVENOUS | Status: DC

## 2018-03-06 MED ORDER — MIDAZOLAM HCL 2 MG/2ML IJ SOLN
INTRAMUSCULAR | Status: AC
  Filled 2018-03-06: qty 2

## 2018-03-06 MED ORDER — ACETAMINOPHEN 500 MG PO TABS: 1000.0000 mg | ORAL_TABLET | ORAL | Status: DC

## 2018-03-06 MED ORDER — GABAPENTIN 800 MG PO TABS: 200.0000 mg | ORAL_TABLET | Freq: Two times a day (BID) | ORAL | Status: DC

## 2018-03-06 MED ORDER — MIDAZOLAM HCL 2 MG/2ML IJ SOLN
INTRAMUSCULAR | Status: DC | PRN
  Administered 2018-03-06: 2 mg via INTRAVENOUS

## 2018-03-06 MED ORDER — FENTANYL CITRATE (PF) 100 MCG/2ML IJ SOLN
INTRAMUSCULAR | Status: AC
  Filled 2018-03-06: qty 2

## 2018-03-06 MED ORDER — DEXAMETHASONE SODIUM PHOSPHATE 10 MG/ML IJ SOLN
INTRAMUSCULAR | Status: DC | PRN
  Administered 2018-03-06: 10 mg via INTRAVENOUS

## 2018-03-06 MED ORDER — ONDANSETRON HCL 4 MG/2ML IJ SOLN
INTRAMUSCULAR | Status: AC
  Filled 2018-03-06: qty 2

## 2018-03-06 MED ORDER — OXYCODONE-ACETAMINOPHEN 5-325 MG PO TABS
1.0000 | ORAL_TABLET | ORAL | Status: DC | PRN
  Administered 2018-03-06 – 2018-03-07 (×3): 1 via ORAL
  Filled 2018-03-06 (×3): qty 1

## 2018-03-06 MED ORDER — FENTANYL CITRATE (PF) 250 MCG/5ML IJ SOLN
INTRAMUSCULAR | Status: AC
  Filled 2018-03-06: qty 5

## 2018-03-06 MED ORDER — GLYCOPYRROLATE 0.2 MG/ML IJ SOLN
INTRAMUSCULAR | Status: DC | PRN
  Administered 2018-03-06: 0.2 mg via INTRAVENOUS

## 2018-03-06 MED ORDER — SODIUM CHLORIDE 0.9 % IJ SOLN
INTRAMUSCULAR | Status: AC
  Filled 2018-03-06: qty 100

## 2018-03-06 MED ORDER — CEFAZOLIN SODIUM-DEXTROSE 2-3 GM-%(50ML) IV SOLR
INTRAVENOUS | Status: DC | PRN
  Administered 2018-03-06: 2 g via INTRAVENOUS

## 2018-03-06 MED ORDER — ROCURONIUM BROMIDE 100 MG/10ML IV SOLN
INTRAVENOUS | Status: AC
  Filled 2018-03-06: qty 1

## 2018-03-06 MED ORDER — TRAZODONE HCL 100 MG PO TABS
100.0000 mg | ORAL_TABLET | Freq: Every day | ORAL | Status: DC
  Administered 2018-03-06: 100 mg via ORAL
  Filled 2018-03-06: qty 1

## 2018-03-06 MED ORDER — GABAPENTIN 300 MG PO CAPS
300.0000 mg | ORAL_CAPSULE | ORAL | Status: DC
  Filled 2018-03-06: qty 1

## 2018-03-06 MED ORDER — MEPERIDINE HCL 25 MG/ML IJ SOLN: 6.2500 mg | INTRAMUSCULAR | Status: DC | PRN

## 2018-03-06 MED ORDER — PROPOFOL 10 MG/ML IV BOLUS
INTRAVENOUS | Status: DC | PRN
  Administered 2018-03-06: 160 mg via INTRAVENOUS

## 2018-03-06 MED ORDER — BUPIVACAINE-EPINEPHRINE 0.5% -1:200000 IJ SOLN
INTRAMUSCULAR | Status: DC | PRN
  Administered 2018-03-06: 30 mL

## 2018-03-06 MED ORDER — FAMOTIDINE 20 MG PO TABS: 10.0000 mg | ORAL_TABLET | Freq: Two times a day (BID) | ORAL | Status: DC | PRN

## 2018-03-06 MED ORDER — FENTANYL CITRATE (PF) 100 MCG/2ML IJ SOLN
INTRAMUSCULAR | Status: DC | PRN
  Administered 2018-03-06 (×2): 50 ug via INTRAVENOUS
  Administered 2018-03-06: 100 ug via INTRAVENOUS
  Administered 2018-03-06: 50 ug via INTRAVENOUS

## 2018-03-06 MED ORDER — IBUPROFEN 800 MG PO TABS
800.0000 mg | ORAL_TABLET | Freq: Three times a day (TID) | ORAL | Status: DC | PRN
  Administered 2018-03-06: 800 mg via ORAL
  Filled 2018-03-06: qty 1

## 2018-03-06 MED ORDER — LIDOCAINE HCL (CARDIAC) PF 100 MG/5ML IV SOSY
PREFILLED_SYRINGE | INTRAVENOUS | Status: DC | PRN
  Administered 2018-03-06: 80 mg via INTRAVENOUS

## 2018-03-06 MED ORDER — HYDROMORPHONE HCL 1 MG/ML IJ SOLN
0.2000 mg | INTRAMUSCULAR | Status: DC | PRN
  Administered 2018-03-06: 0.6 mg via INTRAVENOUS
  Filled 2018-03-06: qty 1

## 2018-03-06 MED ORDER — DEXAMETHASONE SODIUM PHOSPHATE 10 MG/ML IJ SOLN
INTRAMUSCULAR | Status: AC
  Filled 2018-03-06: qty 1

## 2018-03-06 MED ORDER — BUPIVACAINE-EPINEPHRINE (PF) 0.5% -1:200000 IJ SOLN
INTRAMUSCULAR | Status: AC
  Filled 2018-03-06: qty 30

## 2018-03-06 MED ORDER — 0.9 % SODIUM CHLORIDE (POUR BTL) OPTIME
TOPICAL | Status: DC | PRN
  Administered 2018-03-06: 1000 mL

## 2018-03-06 MED ORDER — GABAPENTIN 800 MG PO TABS
200.0000 mg | ORAL_TABLET | Freq: Two times a day (BID) | ORAL | Status: DC
  Filled 2018-03-06: qty 0.5

## 2018-03-06 MED ORDER — FLUOXETINE HCL 20 MG PO CAPS
40.0000 mg | ORAL_CAPSULE | Freq: Every day | ORAL | Status: DC
  Administered 2018-03-06: 40 mg via ORAL
  Filled 2018-03-06 (×2): qty 2

## 2018-03-06 MED ORDER — ROCURONIUM BROMIDE 100 MG/10ML IV SOLN
INTRAVENOUS | Status: DC | PRN
  Administered 2018-03-06: 40 mg via INTRAVENOUS
  Administered 2018-03-06: 10 mg via INTRAVENOUS

## 2018-03-06 MED ORDER — FENTANYL CITRATE (PF) 100 MCG/2ML IJ SOLN
25.0000 ug | INTRAMUSCULAR | Status: DC | PRN
  Administered 2018-03-06 (×2): 50 ug via INTRAVENOUS

## 2018-03-06 MED ORDER — ONDANSETRON HCL 4 MG/2ML IJ SOLN
INTRAMUSCULAR | Status: DC | PRN
  Administered 2018-03-06: 4 mg via INTRAVENOUS

## 2018-03-06 MED ORDER — ONDANSETRON HCL 4 MG/2ML IJ SOLN
4.0000 mg | Freq: Four times a day (QID) | INTRAMUSCULAR | Status: DC | PRN
Start: 2018-03-06 — End: 2018-03-07

## 2018-03-06 MED ORDER — LACTATED RINGERS IV SOLN: INTRAVENOUS | Status: DC

## 2018-03-06 MED ORDER — GLYCOPYRROLATE 0.2 MG/ML IJ SOLN
INTRAMUSCULAR | Status: AC
  Filled 2018-03-06: qty 1

## 2018-03-06 MED ORDER — SUGAMMADEX SODIUM 200 MG/2ML IV SOLN
INTRAVENOUS | Status: DC | PRN
  Administered 2018-03-06: 170 mg via INTRAVENOUS

## 2018-03-06 MED ORDER — ONDANSETRON HCL 4 MG PO TABS: 4.0000 mg | ORAL_TABLET | Freq: Four times a day (QID) | ORAL | Status: DC | PRN

## 2018-03-06 MED ORDER — CEFAZOLIN SODIUM-DEXTROSE 2-4 GM/100ML-% IV SOLN: 2.0000 g | INTRAVENOUS | Status: AC

## 2018-03-06 MED ORDER — PROPOFOL 10 MG/ML IV BOLUS
INTRAVENOUS | Status: AC
  Filled 2018-03-06: qty 40

## 2018-03-06 SURGICAL SUPPLY — 28 items
BLADE SURG 10 STRL SS (BLADE) ×6 IMPLANT
CANISTER SUCT 3000ML PPV (MISCELLANEOUS) ×3 IMPLANT
CONT PATH 16OZ SNAP LID 3702 (MISCELLANEOUS) ×3 IMPLANT
DECANTER SPIKE VIAL GLASS SM (MISCELLANEOUS) IMPLANT
GAUZE PACKING 2X5 YD STRL (GAUZE/BANDAGES/DRESSINGS) ×1 IMPLANT
GAUZE SPONGE 4X4 16PLY XRAY LF (GAUZE/BANDAGES/DRESSINGS) ×3 IMPLANT
GLOVE BIO SURGEON STRL SZ 6.5 (GLOVE) ×3 IMPLANT
GLOVE BIOGEL PI IND STRL 6.5 (GLOVE) ×2 IMPLANT
GLOVE BIOGEL PI IND STRL 7.0 (GLOVE) ×2 IMPLANT
GLOVE BIOGEL PI INDICATOR 6.5 (GLOVE) ×1
GLOVE BIOGEL PI INDICATOR 7.0 (GLOVE) ×1
GOWN STRL REUS W/TWL LRG LVL3 (GOWN DISPOSABLE) ×12 IMPLANT
NDL MAYO CATGUT SZ4 TPR NDL (NEEDLE) ×1 IMPLANT
NDL SPNL 18GX3.5 QUINCKE PK (NEEDLE) ×1 IMPLANT
NEEDLE MAYO CATGUT SZ4 (NEEDLE) ×3 IMPLANT
NEEDLE SPNL 18GX3.5 QUINCKE PK (NEEDLE) ×3 IMPLANT
NS IRRIG 1000ML POUR BTL (IV SOLUTION) ×3 IMPLANT
PACK VAGINAL WOMENS (CUSTOM PROCEDURE TRAY) ×3 IMPLANT
PAD OB MATERNITY 4.3X12.25 (PERSONAL CARE ITEMS) ×3 IMPLANT
SUT VIC AB 0 CT1 36 (SUTURE) ×4 IMPLANT
SUT VIC AB 2-0 CT1 18 (SUTURE) ×7 IMPLANT
SUT VIC AB 2-0 CT1 27 (SUTURE) ×6
SUT VIC AB 2-0 CT1 TAPERPNT 27 (SUTURE) ×4 IMPLANT
SUT VIC AB 3-0 SH 27 (SUTURE) ×6
SUT VIC AB 3-0 SH 27X BRD (SUTURE) ×2 IMPLANT
SUT VICRYL 0 TIES 12 18 (SUTURE) IMPLANT
TOWEL OR 17X24 6PK STRL BLUE (TOWEL DISPOSABLE) ×6 IMPLANT
TRAY FOLEY W/BAG SLVR 14FR (SET/KITS/TRAYS/PACK) ×3 IMPLANT

## 2018-03-06 NOTE — Transfer of Care (Signed)
Immediate Anesthesia Transfer of Care Note  Patient: Amy Roman  Procedure(s) Performed: HYSTERECTOMY VAGINAL WITH BILATERAL SALPINGOOPHORECTOMY (Bilateral Vagina ) LABIAPLASTY  Patient Location: PACU  Anesthesia Type:General  Level of Consciousness: sedated  Airway & Oxygen Therapy: Patient Spontanous Breathing and Patient connected to nasal cannula oxygen  Post-op Assessment: Report given to RN and Post -op Vital signs reviewed and stable  Post vital signs: Reviewed and stable  HR 73 BP 143/73 Sats 95% Resp 18   Last Vitals:  Vitals Value Taken Time  BP    Temp    Pulse 75 03/06/2018  9:12 AM  Resp 14 03/06/2018  9:12 AM  SpO2 96 % 03/06/2018  9:12 AM  Vitals shown include unvalidated device data.  Last Pain:  Vitals:   03/06/18 0621  TempSrc: Oral      Patients Stated Pain Goal: 5 (73/71/06 2694)  Complications: No apparent anesthesia complications

## 2018-03-06 NOTE — Anesthesia Postprocedure Evaluation (Signed)
Anesthesia Post Note  Patient: Aishwarya A [name]-Hussein  Procedure(s) Performed: HYSTERECTOMY VAGINAL WITH BILATERAL SALPINGOOPHORECTOMY (Bilateral Vagina ) LABIAPLASTY     Patient location during evaluation: PACU Anesthesia Type: General Level of consciousness: awake and alert Pain management: pain level controlled Vital Signs Assessment: post-procedure vital signs reviewed and stable Respiratory status: spontaneous breathing, nonlabored ventilation, respiratory function stable and patient connected to nasal cannula oxygen Cardiovascular status: blood pressure returned to baseline and stable Postop Assessment: no apparent nausea or vomiting Anesthetic complications: no    Last Vitals:  Vitals:   03/06/18 1022 03/06/18 1140  BP: 109/64 138/67  Pulse: 66 (!) 55  Resp: 16 18  Temp: 36.4 C 36.4 C  SpO2: 93% 100%    Last Pain:  Vitals:   03/06/18 1430  TempSrc:   PainSc: Asleep   Pain Goal: Patients Stated Pain Goal: 3 (03/06/18 1330)               Montez Hageman

## 2018-03-06 NOTE — H&P (Signed)
Amy Roman is an 67 y.o. female. P4 here today for a TVH/BSO due to complex uterine hyperplasia. She declines an IUD.   No LMP recorded. Patient is postmenopausal.    Past Medical History:  Diagnosis Date  . Anxiety and depression 10/15/2014  . Arthritis    BILATERAL KNEES, HAD CORTISONE INJECTION 03/2017  . Cancer ([place])    RIGHT BREAST,POSITIVE BIOSPY, WILL HAVE SURGERY 03/12/2018 APPOINTMENT TOMORROW   . Depression   . GERD (gastroesophageal reflux disease)    OCCASIONAL WITH CERTAIN FOOD  . Headache   . Seasonal allergies   . SVD (spontaneous vaginal delivery)    x 4  . Unspecified hereditary and idiopathic peripheral neuropathy 05/14/2014   LEFT KNEE AND FOOT    Past Surgical History:  Procedure Laterality Date  . BREAST EXCISIONAL BIOPSY Right   . BREAST LUMPECTOMY  2007   RIGHT BREAST  . BREAST SURGERY     bx, no cancer   . CATARACT EXTRACTION Bilateral   . DILATATION & CURETTAGE/HYSTEROSCOPY WITH MYOSURE N/A 08/09/2017   Procedure: DILATATION & CURETTAGE/HYSTEROSCOPY WITH MYOSURE;  Surgeon: Emily Filbert, MD;  Location: [facility];  Service: Gynecology;  Laterality: N/A;    Family History  Problem Relation Age of Onset  . Diabetes Mother   . Neuropathy Mother   . Neuropathy Sister   . Other Father   . Colon cancer Neg Hx   . Breast cancer Neg Hx     Social History:  reports that she has never smoked. She has never used smokeless tobacco. She reports that she does not drink alcohol or use drugs.  Allergies: No Known Allergies  Medications Prior to Admission  Medication Sig Dispense Refill Last Dose  . acetaminophen (TYLENOL) 325 MG tablet Take 325 mg by mouth 2 (two) times daily as needed for mild pain.   Past Week at Unknown time  . b complex vitamins tablet Take 1 tablet by mouth daily.   Past Week at Unknown time  . famotidine (PEPCID) 10 MG tablet Take 10 mg by mouth 2 (two) times daily. AS NEEDED   03/05/2018 at Unknown time  . FLUoxetine (PROZAC)  40 MG capsule Take 40 mg by mouth daily.   03/05/2018 at Unknown time  . gabapentin (NEURONTIN) 800 MG tablet Take 400 mg by mouth 2 (two) times daily.    03/05/2018 at Unknown time  . ibuprofen (ADVIL,MOTRIN) 600 MG tablet Take 1 tablet (600 mg total) by mouth every 6 (six) hours as needed. 30 tablet 1 Past Month at Unknown time  . temazepam (RESTORIL) 30 MG capsule Take 30 mg by mouth at bedtime as needed for sleep. Pt takes for anxiety   Past Month at Unknown time  . acetaminophen (TYLENOL) 650 MG CR tablet Take 650 mg by mouth every 8 (eight) hours as needed for pain.   Taking  . PRESCRIPTION MEDICATION Take 20 mg by mouth daily. Depression   Unknown at Unknown time    ROS  Blood pressure (!) 121/91, pulse 61, temperature 97.9 F (36.6 C), temperature source Oral, resp. rate 16, height 5\' 1"  (1.549 m), weight 85.4 kg (188 lb 4 oz), SpO2 97 %. Physical Exam Heart- rrr Lungs- CTAB Abd- benign  No results found for this or any previous visit (from the past 24 hour(s)).  No results found.  Assessment/Plan: Plan for TVH/BSO  She understands the risks of surgery, including, but not to infection, bleeding, DVTs, damage to bowel, bladder, ureters. She wishes to  proceed.     Emily Filbert 03/06/2018, 7:10 AM

## 2018-03-06 NOTE — Anesthesia Procedure Notes (Signed)
Procedure Name: Intubation Date/Time: 03/06/2018 7:30 AM Performed by: Kathie Rhodes, CRNA Pre-anesthesia Checklist: Patient identified, Suction available, Emergency Drugs available, Patient being monitored and Timeout performed Patient Re-evaluated:Patient Re-evaluated prior to induction Oxygen Delivery Method: Circle system utilized Preoxygenation: Pre-oxygenation with 100% oxygen Induction Type: IV induction Ventilation: Mask ventilation without difficulty Laryngoscope Size: Miller and 2 Grade View: Grade I Tube type: Oral Tube size: 7.0 mm Number of attempts: 1 Airway Equipment and Method: Stylet Placement Confirmation: ETT inserted through vocal cords under direct vision,  positive ETCO2,  CO2 detector and breath sounds checked- equal and bilateral Secured at: 18 (at lips) cm Tube secured with: Tape Dental Injury: Teeth and Oropharynx as per pre-operative assessment

## 2018-03-06 NOTE — Anesthesia Preprocedure Evaluation (Addendum)
Anesthesia Evaluation  Patient identified by MRN, date of birth, ID band Patient awake    Reviewed: Allergy & Precautions, NPO status , Patient's Chart, lab work & pertinent test results  Airway Mallampati: III  TM Distance: >3 FB Neck ROM: Full    Dental no notable dental hx. (+) Teeth Intact   Pulmonary neg pulmonary ROS,    Pulmonary exam normal breath sounds clear to auscultation       Cardiovascular negative cardio ROS Normal cardiovascular exam Rhythm:Regular Rate:Normal     Neuro/Psych  Headaches, PSYCHIATRIC DISORDERS Anxiety Depression Peripheral neuropathy  Neuromuscular disease    GI/Hepatic Neg liver ROS, GERD  Medicated and Controlled,  Endo/Other  Obesity  Renal/GU negative Renal ROS  negative genitourinary   Musculoskeletal  (+) Arthritis , Osteoarthritis,    Abdominal (+) + obese,   Peds  Hematology negative hematology ROS (+)   Anesthesia Other Findings   Reproductive/Obstetrics DUB                             Anesthesia Physical  Anesthesia Plan  ASA: II  Anesthesia Plan: General   Post-op Pain Management:    Induction: Intravenous  PONV Risk Score and Plan: 4 or greater and Ondansetron, Dexamethasone, Midazolam, Propofol infusion, Metaclopromide and Treatment may vary due to age or medical condition  Airway Management Planned: Oral ETT  Additional Equipment:   Intra-op Plan:   Post-operative Plan: Extubation in OR  Informed Consent: I have reviewed the patients History and Physical, chart, labs and discussed the procedure including the risks, benefits and alternatives for the proposed anesthesia with the patient or authorized representative who has indicated his/her understanding and acceptance.   Dental advisory given  Plan Discussed with: CRNA, Anesthesiologist and Surgeon  Anesthesia Plan Comments:        Anesthesia Quick Evaluation

## 2018-03-06 NOTE — Op Note (Signed)
03/06/2018  9:12 AM  PATIENT:  Amy Roman  67 y.o. 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  PROCEDURE:  Procedure(s): HYSTERECTOMY VAGINAL WITH BILATERAL SALPINGOOPHORECTOMY (Bilateral) LABIAPLASTY  SURGEON:  Surgeon(s) and Role:    * Lorena Benham, Wilhemina Cash, MD - Primary    * Aletha Halim, MD - Assisting   ANESTHESIA:   general  EBL:  200 mL   BLOOD ADMINISTERED:none  DRAINS: none   LOCAL MEDICATIONS USED:  MARCAINE     SPECIMEN:  Source of Specimen:  uterus, tubes and ovaries  DISPOSITION OF SPECIMEN:  PATHOLOGY  COUNTS:  YES  TOURNIQUET:  * No tourniquets in log *  DICTATION: .Dragon Dictation  PLAN OF CARE: overnight observation  PATIENT DISPOSITION:  PACU - hemodynamically stable.   Delay start of Pharmacological VTE agent (>24hrs) due to surgical blood loss or risk of bleeding: not applicable  The risks, benefits, alternatives of surgery were explained, understood, and accepted. All questions were answered and a consent form was signed. She was taken to the operating room and general anesthesia was applied. She was put in the dorsal lithotomy position. Her vagina was prepped and draped in the usual sterile fashion. A timeout procedure was done. For the purpose of being able to do this case, I had to open her fused labia majora (female circumcision).  A Foley catheter was placed and it drained clear urine throughout the case. The cervix was grasped with a single-tooth tenaculum. A total of 80 cc of dilute Marcaine was injected in a circumferential fashion at the cervicovaginal junction. An incision was made at the site. The posterior peritoneum was entered. A long weighted speculum was placed. The anterior peritoneum was entered. A Deaver was placed anteriorly. The uterosacral ligaments were clamped, cut, and ligated. They were tagged and held. 2 Vicryl sutures used  throughout this case unless otherwise specified. The uterus was separated from its pelvic attachments using a similar clamp, cut, ligate technique.  Once the uterus was removed, the bowel was kept out of the operative site with a sponge on a stick. I was able to visualize each adnexa and grabbed each ovary with a Babcock clamp. Using a Kelly clamp, I was able to clamp, cut, and ligate the adnexa.  I then assured hemostasis of all pedicles. I closed the peritoneum in a purse-string fashion. I used the tagged and held sutures of the uterosacral ligaments to incorporate them into the vaginal cuff angles. I closed the peritoneum in a purse string fashion.  I then closed the vaginal cuff in a horizontal, running, locking fashion. I placed packing in the vagina. I then reapproximated the labia majora to return her vulva to its status quo. She was extubated and taken to the recovery room in stable condition.

## 2018-03-06 NOTE — Progress Notes (Signed)
Used Market researcher in [place] for translation.

## 2018-03-07 ENCOUNTER — Encounter (HOSPITAL_COMMUNITY): Payer: Self-pay | Admitting: Obstetrics & Gynecology

## 2018-03-07 DIAGNOSIS — N838 Other noninflammatory disorders of ovary, fallopian tube and broad ligament: Secondary | ICD-10-CM | POA: Diagnosis not present

## 2018-03-07 DIAGNOSIS — N888 Other specified noninflammatory disorders of cervix uteri: Secondary | ICD-10-CM | POA: Diagnosis not present

## 2018-03-07 DIAGNOSIS — K219 Gastro-esophageal reflux disease without esophagitis: Secondary | ICD-10-CM | POA: Diagnosis not present

## 2018-03-07 DIAGNOSIS — Z79899 Other long term (current) drug therapy: Secondary | ICD-10-CM | POA: Diagnosis not present

## 2018-03-07 DIAGNOSIS — F329 Major depressive disorder, single episode, unspecified: Secondary | ICD-10-CM | POA: Diagnosis not present

## 2018-03-07 DIAGNOSIS — N959 Unspecified menopausal and perimenopausal disorder: Secondary | ICD-10-CM | POA: Diagnosis not present

## 2018-03-07 LAB — CBC
HEMATOCRIT: 30.9 % — AB (ref 36.0–46.0)
HEMOGLOBIN: 10.3 g/dL — AB (ref 12.0–15.0)
MCH: 29.3 pg (ref 26.0–34.0)
MCHC: 33.3 g/dL (ref 30.0–36.0)
MCV: 88 fL (ref 78.0–100.0)
Platelets: 156 10*3/uL (ref 150–400)
RBC: 3.51 MIL/uL — ABNORMAL LOW (ref 3.87–5.11)
RDW: 14.9 % (ref 11.5–15.5)
WBC: 6.3 10*3/uL (ref 4.0–10.5)

## 2018-03-07 MED ORDER — IBUPROFEN 800 MG PO TABS: 800.0000 mg | ORAL_TABLET | Freq: Three times a day (TID) | ORAL | 0 refills | Status: DC | PRN

## 2018-03-07 MED ORDER — OXYCODONE-ACETAMINOPHEN 5-325 MG PO TABS: 1.0000 | ORAL_TABLET | ORAL | 0 refills | Status: DC | PRN

## 2018-03-07 NOTE — Discharge Instructions (Signed)
Vaginal Hysterectomy, Care After °Refer to this sheet in the next few weeks. These instructions provide you with information about caring for yourself after your procedure. Your health care provider may also give you more specific instructions. Your treatment has been planned according to current medical practices, but problems sometimes occur. Call your health care provider if you have any problems or questions after your procedure. °What can I expect after the procedure? °After the procedure, it is common to have: °· Pain. °· Soreness and numbness in your incision areas. °· Vaginal bleeding and discharge. °· Constipation. °· Temporary problems emptying the bladder. °· Feelings of sadness or other emotions. ° °Follow these instructions at home: °Medicines °· Take over-the-counter and prescription medicines only as told by your health care provider. °· If you were prescribed an antibiotic medicine, take it as told by your health care provider. Do not stop taking the antibiotic even if you start to feel better. °· Do not drive or operate heavy machinery while taking prescription pain medicine. °Activity °· Return to your normal activities as told by your health care provider. Ask your health care provider what activities are safe for you. °· Get regular exercise as told by your health care provider. You may be told to take short walks every day and go farther each time. °· Do not lift anything that is heavier than 10 lb (4.5 kg). °General instructions ° °· Do not put anything in your vagina for 6 weeks after your surgery or as told by your health care provider. This includes tampons and douches. °· Do not have sex until your health care provider says you can. °· Do not take baths, swim, or use a hot tub until your health care provider approves. °· Drink enough fluid to keep your urine clear or pale yellow. °· Do not drive for 24 hours if you were given a sedative. °· Keep all follow-up visits as told by your health  care provider. This is important. °Contact a health care provider if: °· Your pain medicine is not helping. °· You have a fever. °· You have redness, swelling, or pain at your incision site. °· You have blood, pus, or a bad-smelling discharge from your vagina. °· You continue to have difficulty urinating. °Get help right away if: °· You have severe abdominal or back pain. °· You have heavy bleeding from your vagina. °· You have chest pain or shortness of breath. °This information is not intended to replace advice given to you by your health care provider. Make sure you discuss any questions you have with your health care provider. °Document Released: 02/01/2016 Document Revised: 03/17/2016 Document Reviewed: 10/25/2015 °Elsevier Interactive Patient Education © 2018 [employer] ° °

## 2018-03-07 NOTE — Anesthesia Postprocedure Evaluation (Signed)
Anesthesia Post Note  Patient: Amy Roman  Procedure(s) Performed: HYSTERECTOMY VAGINAL WITH BILATERAL SALPINGOOPHORECTOMY (Bilateral Vagina ) LABIAPLASTY     Patient location during evaluation: [facility] Anesthesia Type: General Level of consciousness: awake and alert and oriented Pain management: pain level controlled Vital Signs Assessment: post-procedure vital signs reviewed and stable Respiratory status: spontaneous breathing, nonlabored ventilation and respiratory function stable Cardiovascular status: stable Postop Assessment: able to ambulate, adequate PO intake and no apparent nausea or vomiting Anesthetic complications: no    Last Vitals:  Vitals:   03/07/18 0354 03/07/18 0740  BP: 113/65 114/64  Pulse: (!) 57 (!) 52  Resp: 18 16  Temp: 36.7 C 36.8 C  SpO2: 96% 95%    Last Pain:  Vitals:   03/07/18 0800  TempSrc:   PainSc: 0-No pain   Pain Goal: Patients Stated Pain Goal: 3 (03/07/18 0410)               Willa Rough

## 2018-03-07 NOTE — Addendum Note (Signed)
Addendum  created 03/07/18 4097 by Jonna Munro, CRNA   Sign clinical note

## 2018-03-08 ENCOUNTER — Other Ambulatory Visit: Payer: Self-pay

## 2018-03-08 ENCOUNTER — Ambulatory Visit: Payer: Self-pay | Admitting: Oncology

## 2018-03-08 NOTE — Discharge Summary (Signed)
Physician Discharge Summary  Patient ID: Amy Roman MRN: [id number] DOB/AGE: September 24, 1951 68 y.o.  Admit date: 03/06/2018 Discharge date: 03/08/2018  Admission Diagnoses: complex uterine hyperplasia  Discharge Diagnoses: same Active Problems:   Post-operative state   Discharged Condition: good  Hospital Course: She underwent an uncomplicated TVH/BSO. By POD #1 she was voiding, tolerating po well, having flatus, and ambulating. She voiced her readiness to go home.   Consults: None  Significant Diagnostic Studies: labs: post op hbg was 10.3.  Treatments: surgery: TVH/BSO  Discharge Exam: Blood pressure 114/64, pulse (!) 52, temperature 98.2 F (36.8 C), temperature source Oral, resp. rate 16, height 5\' 1"  (1.549 m), weight 188 lb 4 oz (85.4 kg), SpO2 95 %. General appearance: alert Resp: clear to auscultation bilaterally Cardio: regular rate and rhythm, S1, S2 normal, no murmur, click, rub or gallop GI: soft, non-tender; bowel sounds normal; no masses,  no organomegaly  Disposition:    Allergies as of 03/07/2018   No Known Allergies     Medication List    TAKE these medications   b complex vitamins tablet Take 1 tablet by mouth daily.   famotidine 10 MG tablet Commonly known as:  PEPCID Take 10 mg by mouth 2 (two) times daily as needed for heartburn.   FLUoxetine 40 MG capsule Commonly known as:  PROZAC Take 40 mg by mouth daily.   gabapentin 800 MG tablet Commonly known as:  NEURONTIN Take 200 mg by mouth 2 (two) times daily.   ibuprofen 800 MG tablet Commonly known as:  ADVIL,MOTRIN Take 1 tablet (800 mg total) by mouth every 8 (eight) hours as needed (mild pain). What changed:    medication strength  how much to take  when to take this  reasons to take this   oxyCODONE-acetaminophen 5-325 MG tablet Commonly known as:  PERCOCET/ROXICET Take 1 tablet by mouth every 4 (four) hours as needed for moderate pain.   traZODone 100 MG  tablet Commonly known as:  DESYREL Take 100 mg by mouth at bedtime.      Follow-up Information    Emily Filbert, MD. Schedule an appointment as soon as possible for a visit in 4 weeks.   Specialty:  Obstetrics and Gynecology Contact information: [place] [id number] [phone]           Signed: Emily Filbert 03/08/2018, 8:17 AM

## 2018-03-11 DIAGNOSIS — C50511 Malignant neoplasm of lower-outer quadrant of right female breast: Secondary | ICD-10-CM | POA: Insufficient documentation

## 2018-03-11 DIAGNOSIS — Z170 Estrogen receptor positive status [ER+]: Secondary | ICD-10-CM

## 2018-03-12 ENCOUNTER — Inpatient Hospital Stay: Payer: Medicare Other | Admitting: Nurse Practitioner

## 2018-03-12 ENCOUNTER — Telehealth: Payer: Self-pay | Admitting: Nurse Practitioner

## 2018-03-12 NOTE — Telephone Encounter (Signed)
Patient called in to reschedule appointment for 5/20 due to having surgery last week.  OK'd by Isaiah Blakes to move to Friday 5/24 with Dr Burr Medico

## 2018-03-15 NOTE — Progress Notes (Signed)
error 

## 2018-03-15 NOTE — Progress Notes (Signed)
[place]  [phone] [phone] [place] Note   Patient Care Team: Lin Landsman, MD as PCP - General (Family Medicine) Alda Berthold, DO as Consulting Physician (Neurology) 03/16/2018  REFERRAL PHYSICIAN: DR. BYERLY   CHIEF COMPLAINTS/PURPOSE OF CONSULTATION:  Newly diagnosed right breast cancer   Oncology History   Cancer Staging Malignant neoplasm of 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, estrogen receptor positive ([place]) Staging form: Breast, AJCC 8th Edition - Clinical stage from 02/16/2018: Stage IB (cT2(m), cN0, cM0, G2, ER+, PR+, HER2-) - Signed by Truitt Merle, MD on 03/11/2018      Malignant neoplasm of 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, estrogen receptor positive ([place])   01/30/2018 Mammogram    Screening mammogram FINDINGS: In the right breast, possible distortion warrants further evaluation. The patient has had an excisional biopsy of her right breast upper outer quadrant in 2007, however this distortion is either better seen or new from her prior mammogram, and it does not quite match the surgical site as marked by a skin scar marker. In the left breast, no findings suspicious for malignancy.      02/14/2018 Mammogram    Diagnostic mammogram: In the lateral aspect of the right breast, posterior depth there is a prominent area of distortion at the approximate 9 o'clock location. While this is in the region of the excisional biopsy that the patient had in 2007, the distortion has become significantly more prominent as compared to the tomosynthesis mammogram performed in September of 2016, and appears to be inferior to the surgical scar.      02/14/2018 Breast US    Ultrasound of the right breast at 8:30, 4 cm from the nipple demonstrates an irregular hypoechoic vascular mass with spiculated margins measuring 2.1 x 2.0 x 1.8 cm. There is a small adjacent mass at 9 o'clock, 4 cm from the nipple, which is  1.2 cm away from the dominant mass. The smaller mass measures 1.1 x 0.7 x 0.7 cm. Together, the 2 masses span 3.6 cm of tissue. Ultrasound of the right axilla demonstrates multiple normal-appearing lymph nodes.  IMPRESSION: 1. There is a highly suspicious mass in the right breast at 830 with a small possible satellite lesion at 9 o'clock. 2.  No evidence of right axillary lymphadenopathy.      02/16/2018 Cancer Staging    Staging form: Breast, AJCC 8th Edition - Clinical stage from 02/16/2018: Stage IB (cT2(m), cN0, cM0, G2, ER+, PR+, HER2-) - Signed by Truitt Merle, MD on 03/11/2018      02/16/2018 Initial Biopsy    Diagnosis 1. Breast, right, needle core biopsy, 8:30 o'clock - INVASIVE DUCTAL CARCINOMA, SEE COMMENT. 2. Breast, right, needle core biopsy, 9 o'clock - INVASIVE DUCTAL CARCINOMA. - LOBULAR NEOPLASIA (ATYPICAL LOBULAR HYPERPLASIA).  ER: 95%, positive, strong staining PR: 80%, positive, moderate staining  Proliferation Marker Ki67: 2%  HER2 - Negative Ratio of HER2/CEP17 signals: 1.80 Average HER2 copy number per cell: 2.97       03/01/2018 Imaging    MR Bilateral breast IMPRESSION: Two areas of known malignancy in the LATERAL portion of the RIGHT breast. No additional areas of concern in either breast. No axillary adenopathy.      03/11/2018 Initial Diagnosis    Malignant neoplasm of 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, estrogen receptor positive ([facility])        HISTORY OF PRESENTING ILLNESS:  Amy Roman 67 y.o. female is here because of newly  diagnosed right breast cancer. She was referred by her surgeon, Dr. Barry Dienes for further evaluation. She is accompanied by an interpretor today. She denies feeling a lump prior to the mammogram. She denies prior breast cancer, however she notes that she had a biopsy in 2007 that returned negative.  She had a screening mammogram on 01/30/2018 that showed: Breast density category C. Further evaluation is suggested  for possible distortion in the right breast.  She then proceeded to a diagnostic mammogram and right ultrasonography on 02/14/2018 with results showing: There is a highly suspicious mass in the right breast at 8:30 with a small possible satellite lesion at 9 o'clock. No evidence of right axillary lymphadenopathy.  She also had a MR Bilateral breast on 03/01/2018 showing: Two areas of known malignancy in the LATERAL portion of the RIGHT breast. No additional areas of concern in either breast. No axillary adenopathy.  She had a right needle core biopsy on 02/16/2018 with results showing: Breast, right, needle core biopsy, 8:30 o'clock with invasive ductal carcinoma. Breast, right, needle core biopsy, 9 o'clock with invasive ductal carcinoma. Lobular neoplasia (atypical lobular hyperplasia). Prognostic indicators significant for: ER, 95% positive with strong staining intensity and PR, 80% positive, with moderate staining intensity. Proliferation marker Ki67 at 2%. HER2 negative.  She also had a hysterectomy with BSO due to vaginal bleeding intermittently occurring since October 2018 completed on 03/06/2018 with pathology showing: Uterus, ovaries and fallopian tubes, cervix: Cervix with nabothian cysts and squamous metaplasia. No dysplasia or malignancy. Endometrium with inactive endometrium. No hyperplasia or malignancy. Myometrium unremarkable with no evidence of malignancy. Uterine serosa unremarkable with no endometriosis or malignancy. Right and left ovaries unremarkable with no endometriosis or malignancy. Right and left fallopian tubes with benign paratubal cyst. No endometriosis or malignancy.   On review of systems, she reports lower abdominal pain and lower back pain following her recent hysterotomy and BSO. she denies back pain and any other symptoms. Pertinent positives are listed and detailed within the above HPI.  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 She denies a hx of DM or any other hx of cancer. She has a FHx of her maternal aunt with leukemia and a cousin with  Another type of cancer with lumps to her neck, however she is unsure of the name of the cancer. She denies a family hx of breast cancer or ovarian cancer. She doesn't smoke cigarettes or consume ETOH and she notes that she is retired at this time. 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 She has 4 sons, 3 of which live with her and the other lives out of the country with her youngest son being age 61 and eldest age 95 and she has 5 grandchildren.   GYN history:  Menarche: 71-58 years old Age at first live birth: 67 years old GP: GxP4 LMP: in her 66's  Contraceptive: N/a HRT: no   The patient is accompanied by a medical interpretor today, who the patient has elected to translate for her during this visit.       MEDICAL HISTORY:  Past Medical History:  Diagnosis Date  . Anxiety and depression 10/15/2014  . Arthritis    BILATERAL KNEES, HAD CORTISONE INJECTION 03/2017  . Brain cancer ([place])   . Depression   . GERD (gastroesophageal reflux disease)    OCCASIONAL WITH CERTAIN FOOD  . Headache   . Seasonal allergies   . SVD (spontaneous vaginal delivery)  x 4  . Unspecified hereditary and idiopathic peripheral neuropathy 05/14/2014   LEFT KNEE AND FOOT    SURGICAL HISTORY: Past Surgical History:  Procedure Laterality Date  . BREAST EXCISIONAL BIOPSY Right   . BREAST LUMPECTOMY  2007   RIGHT BREAST  . BREAST SURGERY     bx, no cancer   . CATARACT EXTRACTION Bilateral   . DILATATION & CURETTAGE/HYSTEROSCOPY WITH MYOSURE N/A 08/09/2017   Procedure: DILATATION & CURETTAGE/HYSTEROSCOPY WITH MYOSURE;  Surgeon: Emily Filbert, MD;  Location: [facility];  Service: Gynecology;  Laterality: N/A;  . LABIOPLASTY  03/06/2018   Procedure: LABIAPLASTY;  Surgeon: Emily Filbert, MD;  Location: [facility];  Service: Gynecology;;  . VAGINAL HYSTERECTOMY Bilateral 03/06/2018    Procedure: HYSTERECTOMY VAGINAL WITH BILATERAL SALPINGOOPHORECTOMY;  Surgeon: Emily Filbert, MD;  Location: [facility];  Service: Gynecology;  Laterality: Bilateral;    SOCIAL HISTORY: Social History   Socioeconomic History  . Marital status: Married    Spouse name: Not on file  . Number of children: 4  . Years of education: Not on file  . Highest education level: Not on file  Occupational History  . Occupation: house   Social Needs  . Financial resource strain: Not on file  . Food insecurity:    Worry: Not on file    Inability: Not on file  . Transportation needs:    Medical: Not on file    Non-medical: Not on file  Tobacco Use  . Smoking status: Never Smoker  . Smokeless tobacco: Never Used  Substance and Sexual Activity  . Alcohol use: No  . Drug use: No  . Sexual activity: Not on file  Lifestyle  . Physical activity:    Days per week: Not on file    Minutes per session: Not on file  . Stress: Not on file  Relationships  . Social connections:    Talks on phone: Not on file    Gets together: Not on file    Attends religious service: Not on file    Active member of club or organization: Not on file    Attends meetings of clubs or organizations: Not on file    Relationship status: Not on file  . Intimate partner violence:    Fear of current or ex partner: Not on file    Emotionally abused: Not on file    Physically abused: Not on file    Forced sexual activity: Not on file  Other Topics Concern  . Not on file  Social History Narrative   Original from [place]   Moved to the [place] 2000   Widow    She lives with son.  She had four sons.          FAMILY HISTORY: Family History  Problem Relation Age of Onset  . Diabetes Mother   . Neuropathy Mother   . Neuropathy Sister   . Cancer Sister        unknown type cancer in neck   . Other Father   . Cancer Maternal Aunt        leukemia   . Colon cancer Neg Hx   . Breast cancer Neg Hx     ALLERGIES:  has No  Known Allergies.  MEDICATIONS:  Current Outpatient Medications  Medication Sig Dispense Refill  . b complex vitamins tablet Take 1 tablet by mouth daily.    . famotidine (PEPCID) 10 MG tablet Take 10 mg by mouth 2 (two) times daily  as needed for heartburn.     Marland Kitchen FLUoxetine (PROZAC) 40 MG capsule Take 40 mg by mouth daily.    Marland Kitchen gabapentin (NEURONTIN) 800 MG tablet Take 200 mg by mouth 2 (two) times daily.     Marland Kitchen ibuprofen (ADVIL,MOTRIN) 800 MG tablet Take 1 tablet (800 mg total) by mouth every 8 (eight) hours as needed (mild pain). 30 tablet 0  . oxyCODONE-acetaminophen (PERCOCET/ROXICET) 5-325 MG tablet Take 1 tablet by mouth every 4 (four) hours as needed for moderate pain. 30 tablet 0  . traZODone (DESYREL) 100 MG tablet Take 100 mg by mouth at bedtime.     No current facility-administered medications for this visit.     REVIEW OF SYSTEMS:   Constitutional: Denies fevers, chills or abnormal night sweats Eyes: Denies blurriness of vision, double vision or watery eyes Ears, nose, mouth, throat, and face: Denies mucositis or sore throat Respiratory: Denies cough, dyspnea or wheezes Cardiovascular: Denies palpitation, chest discomfort or lower extremity swelling Gastrointestinal:  Denies nausea, heartburn or change in bowel habits Skin: Denies abnormal skin rashes Lymphatics: Denies new lymphadenopathy or easy bruising Neurological:Denies numbness, tingling or new weaknesses Behavioral/Psych: Mood is stable, no new changes  All other systems were reviewed with the patient and are negative.  PHYSICAL EXAMINATION:  ECOG PERFORMANCE STATUS: 1 - Symptomatic but completely ambulatory  Vitals:   03/16/18 1437  BP: (!) 150/62  Pulse: 72  Resp: 18  Temp: 98.4 F (36.9 C)  SpO2: 99%   Filed Weights   03/16/18 1437  Weight: 189 lb 6.4 oz (85.9 kg)    GENERAL:alert, no distress and comfortable SKIN: skin color, texture, turgor are normal, no rashes or significant lesions EYES:  normal, conjunctiva are pink and non-injected, sclera clear OROPHARYNX:no exudate, no erythema and lips, buccal mucosa, and tongue normal  NECK: supple, thyroid normal size, non-tender, without nodularity LYMPH:  no palpable lymphadenopathy in the cervical, axillary or inguinal LUNGS: clear to auscultation and percussion with normal breathing effort HEART: regular rate & rhythm and no murmurs and no lower extremity edema ABDOMEN:abdomen soft, non-tender and normal bowel sounds Musculoskeletal:no cyanosis of digits and no clubbing  PSYCH: alert & oriented x 3 with fluent speech NEURO: no focal motor/sensory deficits Breast: skin retraction in the right lower quadrant with underneath soft tissue fullness. No discrete mass or adenopathy. Left breast normal.  LABORATORY DATA:  I have reviewed the data as listed CBC Latest Ref Rng & Units 03/07/2018 02/28/2018 08/08/2017  WBC 4.0 - 10.5 K/uL 6.3 4.1 5.0  Hemoglobin 12.0 - 15.0 g/dL 10.3(L) 12.0 12.8  Hematocrit 36.0 - 46.0 % 30.9(L) 36.5 38.8  Platelets 150 - 400 K/uL 156 220 235    CMP Latest Ref Rng & Units 02/28/2018 05/01/2017 05/14/2014  Glucose 65 - 99 mg/dL 107(H) 102(H) 128(H)  BUN 6 - 20 mg/dL 17 26(H) 16  Creatinine 0.44 - 1.00 mg/dL 0.80 0.95 0.8  Sodium 135 - 145 mmol/L 136 139 137  Potassium 3.5 - 5.1 mmol/L 4.3 4.1 3.8  Chloride 101 - 111 mmol/L 102 102 107  CO2 22 - 32 mmol/L '23 26 25  ' Calcium 8.9 - 10.3 mg/dL 9.4 9.8 9.2  Total Protein 6.5 - 8.1 g/dL - 7.6 7.0  Total Bilirubin 0.3 - 1.2 mg/dL - 0.4 0.5  Alkaline Phos 38 - 126 U/L - 77 86  AST 15 - 41 U/L - 32 24  ALT 14 - 54 U/L - 29 18    PATHOLOGY: Diagnosis, 03/06/2018 Uterus, ovaries  and fallopian tubes, cervix CERVIX: - NABOTHIAN CYSTS AND SQUAMOUS METAPLASIA. - NO DYSPLASIA OR MALIGNANCY. ENDOMETRIUM: - INACTIVE ENDOMETRIUM. - NO HYPERPLASIA OR MALIGNANCY. - SEE MICROSCOPIC DESCRIPTION. MYOMETRIUM: - UNREMARKABLE. - NO EVIDENCE OF MALIGNANCY. UTERINE  SEROSA: - UNREMARKABLE. - NO ENDOMETRIOSIS OR MALIGNANCY. RIGHT AND LEFT OVARIES: - UNREMARKABLE. - NO ENDOMETRIOSIS OR MALIGNANCY. RIGHT AND LEFT FALLOPIAN TUBES: - BENIGN PARATUBAL CYST. - NO ENDOMETRIOSIS OR MALIGNANCY.  Diagnosis, 02/16/2018 1. Breast, right, needle core biopsy, 8:30 o'clock - INVASIVE DUCTAL CARCINOMA, SEE COMMENT. 2. Breast, right, needle core biopsy, 9 o'clock - INVASIVE DUCTAL CARCINOMA. - LOBULAR NEOPLASIA (ATYPICAL LOBULAR HYPERPLASIA).  RADIOGRAPHIC STUDIES: I have personally reviewed the radiological images as listed and agreed with the findings in the report. Mr Breast Bilateral W [place] Cad  Result Date: 03/01/2018 CLINICAL DATA:  Recent diagnosis of breast cancer in the RIGHT breast. Ultrasound-guided core biopsy of mass in the 8:30 o'clock location of the RIGHT breast showed grade 2 invasive ductal carcinoma. Biopsy of the mass in the 9 o'clock location of the RIGHT breast showed grade 2 invasive ductal carcinoma and lobular neoplasia (atypical lobular hyperplasia). LABS:  None applicable EXAM: BILATERAL BREAST MRI WITH AND WITHOUT CONTRAST TECHNIQUE: Multiplanar, multisequence MR images of both breasts were obtained prior to and following the intravenous administration of 15 ml of MultiHance. THREE-DIMENSIONAL MR IMAGE RENDERING ON INDEPENDENT WORKSTATION: Three-dimensional MR images were rendered by post-processing of the original MR data on an independent workstation. The three-dimensional MR images were interpreted, and findings are reported in the following complete MRI report for this study. Three dimensional images were evaluated at the independent DynaCad workstation COMPARISON:  02/16/2018 and earlier FINDINGS: Breast composition: c. Heterogeneous fibroglandular tissue. Background parenchymal enhancement: Marked. Right breast: Within the LOWER OUTER QUADRANT of the RIGHT breast there is an enhancing round mass with spiculated margins measuring 2.5  x 2.4 centimeters and demonstrating washout type enhancement kinetics. Within this mass there is tissue marker clip artifact. Anterior to this mass, skin retraction. No skin enhancement or continuity of the mass with the skin. Just superior to this mass, there is tissue marker clip artifact from a coil shaped clip, marking the known malignancy in the 9 o'clock location of the RIGHT breast. There is no parenchymal enhancement in this location, but enhancement in a small mass could be obscured by the coil shaped clip. Left breast: No mass or abnormal enhancement. Lymph nodes: No abnormal appearing lymph nodes. Ancillary findings:  None. IMPRESSION: Two areas of known malignancy in the LATERAL portion of the RIGHT breast. No additional areas of concern in either breast. No axillary adenopathy. RECOMMENDATION: Treatment plan for known RIGHT breast cancer. BI-RADS CATEGORY  6: Known biopsy-proven malignancy. Electronically Signed   By: Nolon Nations M.D.   On: 03/01/2018 14:05   US Breast Ltd Uni Right Inc Axilla  Result Date: 02/14/2018 CLINICAL DATA:  Screening recall for a right breast distortion. The patient has history of a stereotactic biopsy of the right breast at 9 o'clock in 2007 with pathology indicating a sclerosing papilloma. She has surgical excisional biopsy in August of 2007 with final pathology indicating a complex sclerosing lesion with atypical ductal hyperplasia. EXAM: DIGITAL DIAGNOSTIC UNILATERAL RIGHT MAMMOGRAM WITH CAD AND TOMO RIGHT BREAST ULTRASOUND COMPARISON:  Previous exam(s). ACR Breast Density Category d: The breast tissue is extremely dense, which lowers the sensitivity of mammography. FINDINGS: In the lateral aspect of the right breast, posterior depth there is a prominent area of distortion at the  approximate 9 o'clock location. While this is in the region of the excisional biopsy that the patient had in 2007, the distortion has become significantly more prominent as compared to  the tomosynthesis mammogram performed in September of 2016, and appears to be inferior to the surgical scar. Mammographic images were processed with CAD. Physical exam of the lateral right breast demonstrates a well-healed surgical scar on the upper-outer quadrant of the right breast. On the slightly lower outer quadrant of the right breast, there is a prominent dimpling of the skin, with a palpable lump. Ultrasound of the right breast at 8:30, 4 cm from the nipple demonstrates an irregular hypoechoic vascular mass with spiculated margins measuring 2.1 x 2.0 x 1.8 cm. There is a small adjacent mass at 9 o'clock, 4 cm from the nipple, which is 1.2 cm away from the dominant mass. The smaller mass measures 1.1 x 0.7 x 0.7 cm. Together, the 2 masses span 3.6 cm of tissue. Ultrasound of the right axilla demonstrates multiple normal-appearing lymph nodes. IMPRESSION: 1. There is a highly suspicious mass in the right breast at 830 with a small possible satellite lesion at 9 o'clock. 2.  No evidence of right axillary lymphadenopathy. RECOMMENDATION: Ultrasound guided biopsy is recommended for the 2 masses in the right breast at 8:30 and 9 o'clock. The patient has been scheduled for biopsy on 02/16/2018 at 9:30 a.m. I have discussed the findings and recommendations with the patient. Results were also provided in writing at the conclusion of the visit. If applicable, a reminder letter will be sent to the patient regarding the next appointment. BI-RADS CATEGORY  5: Highly suggestive of malignancy. Electronically Signed   By: Ammie Ferrier M.D.   On: 02/14/2018 15:21   Mm Clip Placement Right  Result Date: 02/16/2018 CLINICAL DATA:  Post biopsy mammogram of the right breast for clip placement. EXAM: DIAGNOSTIC RIGHT MAMMOGRAM POST ULTRASOUND BIOPSY COMPARISON:  Previous exam(s). FINDINGS: Mammographic images were obtained following ultrasound guided biopsy of 2 masses in the right breast. The ribbon shaped biopsy  marking clip is well positioned at the site of the distorted mass in the lower outer quadrant of the right breast. The coil shaped biopsy marking clip is well positioned in the lateral right breast in the expected location of the possible satellite lesion. IMPRESSION: 1. Appropriate positioning of the ribbon shaped biopsy marking clip at the site of the distorted mass in the lower outer right breast. 2. The coil shaped biopsy marking clip is in the anticipated area of the possible satellite lesion at the 9 o'clock position in the right breast. Final Assessment: Post Procedure Mammograms for Marker Placement Electronically Signed   By: Ammie Ferrier M.D.   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 GRADE II INVASIVE DUCTAL CARCINOMA, LOBULAR NEOPLASIA (ATYPICAL LOBULAR HYPERPLASIA) of the Right breast, 9 o'clock. This was found to be concordant by Dr. Ammie Ferrier. Pathology results were discussed with the patient by telephone using [place], the patient speaks Arabic. The patient reported doing well after the biopsies  with tenderness at the sites. Post biopsy instructions and care were reviewed and questions were answered. The patient was encouraged to call [facility] for any additional concerns. Surgical consultation has been arranged with Dr. Stark Klein, per patient request, at [facility] on Feb 23, 2018. If breast conservation is being considered, a bilateral breast MRI is  recommended for extent of disease due to multifocality and dense breast tissue. Pathology results reported by Terie Purser, RN on 02/20/2018. Electronically Signed   By: Ammie Ferrier M.D.   On: 02/20/2018 14:43   Result Date: 02/20/2018 CLINICAL DATA:  67 year old female presenting for ultrasound-guided biopsy of 2 masses in the right breast. EXAM: ULTRASOUND GUIDED RIGHT BREAST CORE NEEDLE BIOPSY COMPARISON:  Previous exam(s). FINDINGS: I met with the patient and we discussed the procedure of ultrasound-guided biopsy, including benefits and alternatives. We discussed the high likelihood of a successful procedure. We discussed the risks of the procedure, including infection, bleeding, tissue injury, clip migration, and inadequate sampling. Informed written consent was given. The usual time-out protocol was performed immediately prior to the procedure. Lesion quadrant: Lower outer quadrant Using sterile technique and 1% Lidocaine as local anesthetic, under direct ultrasound visualization, a 14 gauge spring-loaded device was used to perform biopsy of a mass in the right breast at 8:30 using an inferior approach. At the conclusion of the procedure a ribbon shaped tissue marker clip was deployed into the biopsy cavity. Lesion quadrant: Lower outer quadrant Using sterile technique and 1% Lidocaine as local anesthetic, under direct ultrasound visualization, a 14 gauge spring-loaded device was used to perform biopsy of a mass in the right breast at 9 o'clock using an inferior approach. At the conclusion of the procedure a coil shaped tissue marker clip was deployed into the biopsy cavity. Follow up 2 view mammogram was performed and dictated separately. IMPRESSION: 1. Ultrasound guided biopsy of a mass in the right breast at 8:30. No apparent complications. 2. Ultrasound-guided biopsy of a mass in the right breast at 9 o'clock. No apparent complications. Electronically Signed: By: Ammie Ferrier M.D. On:  02/16/2018 11:15   [place] Rt Breast Bx W Loc Dev Ea Add Lesion Img Bx Spec US Guide  Addendum Date: 02/20/2018   ADDENDUM REPORT: 02/20/2018 14:43 ADDENDUM: Pathology revealed GRADE II INVASIVE DUCTAL CARCINOMA of the Right breast, 8:30 o'clock. GRADE II INVASIVE DUCTAL CARCINOMA, LOBULAR NEOPLASIA (ATYPICAL LOBULAR HYPERPLASIA) of the Right breast, 9 o'clock. This was found to be concordant by Dr. Ammie Ferrier. Pathology results were discussed with the patient by telephone using [place], the patient speaks Arabic. The patient reported doing well after the biopsies with tenderness at the sites. Post biopsy instructions and care were reviewed and questions were answered. The patient was encouraged to call [facility] for any additional concerns. Surgical consultation has been arranged with Dr. Stark Klein, per patient request, at [facility] Surgery on Feb 23, 2018. If breast conservation is being considered, a bilateral breast MRI is recommended for extent of disease due to multifocality and dense breast tissue. Pathology results reported by Terie Purser, RN on 02/20/2018. Electronically Signed   By: Ammie Ferrier M.D.   On: 02/20/2018 14:43   Result Date: 02/20/2018 CLINICAL DATA:  67 year old female presenting for ultrasound-guided biopsy of 2 masses in the right breast. EXAM: ULTRASOUND GUIDED RIGHT BREAST CORE NEEDLE BIOPSY COMPARISON:  Previous exam(s). FINDINGS: I met with the patient and we discussed the procedure of ultrasound-guided  biopsy, including benefits and alternatives. We discussed the high likelihood of a successful procedure. We discussed the risks of the procedure, including infection, bleeding, tissue injury, clip migration, and inadequate sampling. Informed written consent was given. The usual time-out protocol was performed immediately prior to the procedure. Lesion quadrant: Lower outer quadrant Using sterile technique and 1% Lidocaine as  local anesthetic, under direct ultrasound visualization, a 14 gauge spring-loaded device was used to perform biopsy of a mass in the right breast at 8:30 using an inferior approach. At the conclusion of the procedure a ribbon shaped tissue marker clip was deployed into the biopsy cavity. Lesion quadrant: Lower outer quadrant Using sterile technique and 1% Lidocaine as local anesthetic, under direct ultrasound visualization, a 14 gauge spring-loaded device was used to perform biopsy of a mass in the right breast at 9 o'clock using an inferior approach. At the conclusion of the procedure a coil shaped tissue marker clip was deployed into the biopsy cavity. Follow up 2 view mammogram was performed and dictated separately. IMPRESSION: 1. Ultrasound guided biopsy of a mass in the right breast at 8:30. No apparent complications. 2. Ultrasound-guided biopsy of a mass in the right breast at 9 o'clock. No apparent complications. Electronically Signed: By: Ammie Ferrier M.D. On: 02/16/2018 11:15    ASSESSMENT & PLAN:  Amy Roman is a 68 y.o. African female with a history of Arthritis and neuropathy  1.  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 She then proceeded to a diagnostic mammogram and right ultrasonography on 02/14/2018 with results showing: There is a highly suspicious 2.1cm mass in the right breast at 8:30 with a small 1.1cm possible satellite lesion at 9 o'clock. No evidence of right axillary lymphadenopathy. -She also had a MR Bilateral breast on 03/01/2018 showing: Two areas of known malignancy in the LATERAL portion of the RIGHT breast. No additional areas of concern in either breast. No axillary adenopathy.  -She had a right needle core biopsy on 02/16/2018 with results showing: Breast, right, needle core biopsy, 8:30 o'clock with invasive ductal carcinoma. Breast,  right, needle core biopsy, 9 o'clock with invasive ductal carcinoma. Lobular neoplasia (atypical lobular hyperplasia). Prognostic indicators significant for: ER, 95% positive with strong staining intensity and PR, 80% positive, with moderate staining intensity. Proliferation marker Ki67 at 2%. HER2 negative. --We discussed her imaging findings and the biopsy results in great details. -She was seen by breast surgeon Dr. BBarry Dienes  Due to the size of her 2 tumors, she may require mastectomy, however patient is strongly prefer lumpectomy if it is possible.  She is quite anxious to get his surgery done. -I recommend a Oncotype Dx test on the surgical sample and we'll make a decision about adjuvant chemotherapy based on the Oncotype result. Written material of this test was given to her. She is young and fit, would be a good candidate for chemotherapy if her Oncotype recurrence score is high. -If her surgical sentinel lymph node node positive, I recommend mammaprint for further risk stratification and guide adjuvant chemotherapy. -Giving the strong ER and PR expression in her postmenopausal status, I recommend adjuvant endocrine therapy with aromatase inhibitor for a total of 5-10 years to reduce the risk of cancer recurrence. Potential benefits and side effects were discussed with patient and she is interested. -She will also be seen by radiation oncologist Dr. SIsidore Mooson 5/31. If her surgical sentinel lymph  nodes were negative, she would not need post mastectomy radiation.  -We also discussed the breast cancer surveillance after her surgery. She will continue annual screening mammogram, self exam, and a routine office visit with lab and exam with [place]. -I encouraged her to have healthy diet and exercise regularly.  -f/u after surgery.   2. Anxiety, depression -Patient does take anti-depressants  -She reports that she doesn't want her family to know of her diagnosis  Plan:  -She will proceed breast surgery  with Dr. Barry Dienes soon -Oncotype on her surgical sample, or mammaprint if node positive -I plan to see her after surgery or radiation, depends on her Oncotype results and needs for radiation  -I spoke with our breast cancer navigator [facility], she will follow-up next week. -I will send a message to Dr. Barry Dienes, patient strongly prefers lumpectomy, and wants surgery to be done as soon as possible.  All questions were answered. The patient knows to call the clinic with any problems, questions or concerns. I spent 40 minutes counseling the patient face to face. The total time spent in the appointment was 55 minutes and more than 50% was on counseling.   This document serves as a record of services personally performed by Truitt Merle, MD. It was created on her behalf by Steva Colder, a trained medical scribe. The creation of this record is based on the scribe's personal observations and the provider's statements to them.   I have reviewed the above documentation for accuracy and completeness, and I agree with the above.    Truitt Merle, MD 03/16/2018 2:58 PM

## 2018-03-16 ENCOUNTER — Encounter: Payer: Self-pay | Admitting: Hematology

## 2018-03-16 ENCOUNTER — Encounter: Payer: Self-pay | Admitting: *Deleted

## 2018-03-16 ENCOUNTER — Inpatient Hospital Stay: Payer: Medicare Other | Attending: Nurse Practitioner | Admitting: Hematology

## 2018-03-16 VITALS — BP 150/62 | HR 72 | Temp 98.4°F | Resp 18 | Ht 61.0 in | Wt 189.4 lb

## 2018-03-16 DIAGNOSIS — F419 Anxiety disorder, unspecified: Secondary | ICD-10-CM

## 2018-03-16 DIAGNOSIS — F329 Major depressive disorder, single episode, unspecified: Secondary | ICD-10-CM

## 2018-03-16 DIAGNOSIS — Z170 Estrogen receptor positive status [ER+]: Secondary | ICD-10-CM | POA: Diagnosis not present

## 2018-03-16 DIAGNOSIS — C50511 Malignant neoplasm of lower-outer quadrant of right female breast: Secondary | ICD-10-CM

## 2018-03-18 ENCOUNTER — Encounter: Payer: Self-pay | Admitting: Hematology

## 2018-03-20 ENCOUNTER — Encounter: Payer: Self-pay | Admitting: *Deleted

## 2018-03-20 ENCOUNTER — Other Ambulatory Visit: Payer: Self-pay | Admitting: General Surgery

## 2018-03-20 DIAGNOSIS — Z170 Estrogen receptor positive status [ER+]: Principal | ICD-10-CM

## 2018-03-20 DIAGNOSIS — C50511 Malignant neoplasm of lower-outer quadrant of right female breast: Secondary | ICD-10-CM

## 2018-03-21 ENCOUNTER — Other Ambulatory Visit: Payer: Self-pay | Admitting: General Surgery

## 2018-03-21 DIAGNOSIS — Z170 Estrogen receptor positive status [ER+]: Principal | ICD-10-CM

## 2018-03-21 DIAGNOSIS — C50511 Malignant neoplasm of lower-outer quadrant of right female breast: Secondary | ICD-10-CM

## 2018-03-23 ENCOUNTER — Ambulatory Visit
Admission: RE | Admit: 2018-03-23 | Discharge: 2018-03-23 | Disposition: A | Discharge status: Home / Self Care | Payer: Medicare Other | Source: Ambulatory Visit | Attending: Radiation Oncology | Admitting: Radiation Oncology

## 2018-03-23 ENCOUNTER — Ambulatory Visit: Payer: Medicare Other

## 2018-03-23 NOTE — Progress Notes (Signed)
Location of Breast Cancer: Right Breast  Histology per Pathology Report:  02/16/18 Diagnosis 1. Breast, right, needle core biopsy, 8:30 o'clock - INVASIVE DUCTAL CARCINOMA, SEE COMMENT. 2. Breast, right, needle core biopsy, 9 o'clock - INVASIVE DUCTAL CARCINOMA. - LOBULAR NEOPLASIA (ATYPICAL LOBULAR HYPERPLASIA  Receptor Status: ER(100%), PR (80%), Her2-neu (NEG), Ki-(2%)  Did patient present with symptoms or was this found on screening mammography?: She had a screening mammogram which demonstrated possible distortion close to a previous excisional biopsy site to the upper quadrant of her right breast.   Past/Anticipated interventions by surgeon, if any: Dr. Barry Dienes- Surgery scheduled for 04/05/18  Past/Anticipated interventions by medical oncology, if any:  Dr. Burr Medico 03/16/18.  Plan:  -She will proceed breast surgery with Dr. Barry Dienes soon -Oncotype on her surgical sample, or mammaprint if node positive -I plan to see her after surgery or radiation, depends on her Oncotype results and needs for radiation  -I spoke with our breast cancer navigator [facility], she will follow-up next week. -I will send a message to Dr. Barry Dienes, patient strongly prefers lumpectomy, and wants surgery to be done as soon as possible   Lymphedema issues, if any:  N/A  Pain issues, if any:  She has neuropathy leg pain.   SAFETY ISSUES:  Prior radiation? No  Pacemaker/ICD? No  Possible current pregnancy? No  Is the patient on methotrexate? No  Current Complaints / other details:    BP 112/75   Pulse 75   Temp 98.4 F (36.9 C)   Resp 18   Wt 187 lb (84.8 kg)   SpO2 100%   BMI 35.33 kg/m    Wt Readings from Last 3 Encounters:  03/28/18 187 lb (84.8 kg)  03/16/18 189 lb 6.4 oz (85.9 kg)  03/06/18 188 lb 4 oz (85.4 kg)      Derry Kassel, Stephani Police, RN 03/23/2018,10:46 AM

## 2018-03-26 ENCOUNTER — Telehealth (INDEPENDENT_AMBULATORY_CARE_PROVIDER_SITE_OTHER): Payer: Self-pay

## 2018-03-26 NOTE — Telephone Encounter (Signed)
Talked with Caren Griffins with [place] [employer] concerning patient's application.  Caren Griffins stated that a verification was needed for Monovisc injection.  Advised that a Product verification form will be faxed.  Once received will complete and fax back.

## 2018-03-27 ENCOUNTER — Other Ambulatory Visit: Payer: Self-pay

## 2018-03-27 ENCOUNTER — Encounter (HOSPITAL_BASED_OUTPATIENT_CLINIC_OR_DEPARTMENT_OTHER): Payer: Self-pay | Admitting: *Deleted

## 2018-03-27 ENCOUNTER — Telehealth (INDEPENDENT_AMBULATORY_CARE_PROVIDER_SITE_OTHER): Payer: Self-pay

## 2018-03-27 NOTE — Telephone Encounter (Signed)
Faxed [employer] product prescription form to [phone] for Monovisc injection, bilateral knee.

## 2018-03-27 NOTE — Progress Notes (Signed)
Amy Roman at [phone] and set up for Arabic interpreter pt requests [facility]. Bethena Roys stated that she would try to have this worked out for 04/05/18 DOS, if there would by any problems she would give me a call back.

## 2018-03-28 ENCOUNTER — Encounter: Payer: Self-pay | Admitting: General Practice

## 2018-03-28 ENCOUNTER — Ambulatory Visit
Admission: RE | Admit: 2018-03-28 | Discharge: 2018-03-28 | Disposition: A | Discharge status: Home / Self Care | Payer: Medicare Other | Source: Ambulatory Visit | Attending: Radiation Oncology | Admitting: Radiation Oncology

## 2018-03-28 ENCOUNTER — Encounter: Payer: Self-pay | Admitting: Radiation Oncology

## 2018-03-28 ENCOUNTER — Other Ambulatory Visit: Payer: Self-pay

## 2018-03-28 VITALS — BP 112/75 | HR 75 | Temp 98.4°F | Resp 18 | Wt 187.0 lb

## 2018-03-28 DIAGNOSIS — R110 Nausea: Secondary | ICD-10-CM | POA: Insufficient documentation

## 2018-03-28 DIAGNOSIS — Z90710 Acquired absence of both cervix and uterus: Secondary | ICD-10-CM | POA: Diagnosis not present

## 2018-03-28 DIAGNOSIS — L299 Pruritus, unspecified: Secondary | ICD-10-CM | POA: Diagnosis not present

## 2018-03-28 DIAGNOSIS — G629 Polyneuropathy, unspecified: Secondary | ICD-10-CM | POA: Diagnosis not present

## 2018-03-28 DIAGNOSIS — R011 Cardiac murmur, unspecified: Secondary | ICD-10-CM | POA: Diagnosis not present

## 2018-03-28 DIAGNOSIS — Z170 Estrogen receptor positive status [ER+]: Secondary | ICD-10-CM | POA: Insufficient documentation

## 2018-03-28 DIAGNOSIS — Z809 Family history of malignant neoplasm, unspecified: Secondary | ICD-10-CM | POA: Insufficient documentation

## 2018-03-28 DIAGNOSIS — Z90722 Acquired absence of ovaries, bilateral: Secondary | ICD-10-CM | POA: Diagnosis not present

## 2018-03-28 DIAGNOSIS — C50511 Malignant neoplasm of lower-outer quadrant of right female breast: Secondary | ICD-10-CM | POA: Insufficient documentation

## 2018-03-28 DIAGNOSIS — F418 Other specified anxiety disorders: Secondary | ICD-10-CM | POA: Insufficient documentation

## 2018-03-28 DIAGNOSIS — Z79899 Other long term (current) drug therapy: Secondary | ICD-10-CM | POA: Insufficient documentation

## 2018-03-28 DIAGNOSIS — K219 Gastro-esophageal reflux disease without esophagitis: Secondary | ICD-10-CM | POA: Insufficient documentation

## 2018-03-28 NOTE — Progress Notes (Signed)
[place] CSW Progress Note  CSW called patient w interpreter [phone] to discuss patient's desire to complete Advanced Directives.  Unable to reach patient, son states she is not available at this time.  CSW will attempt recontact tomorrow.    Edwyna Shell, LCSW Clinical Social Worker Phone:  [phone]

## 2018-03-28 NOTE — Progress Notes (Signed)
Radiation Oncology         [phone] ________________________________  Initial Outpatient Consultation  Name: Amy Roman MRN: [id number]  Date: 03/28/2018  DOB: 1950/12/18  LS:LHTDS, Zadie Cleverly, MD  Stark Klein, MD   REFERRING PHYSICIAN: Stark Klein, MD  DIAGNOSIS:    ICD-10-CM   1. Malignant neoplasm of 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, estrogen receptor positive ([place]) C50.511    Z17.0    Stage IB Right Breast LOQ Invasive Ductal Carcinoma, ER(+) / PR(+) / Her2(-), Grade 2  CHIEF COMPLAINT: Here to discuss management of right breast cancer  HISTORY OF PRESENT ILLNESS::Amy Roman is a 67 y.o. female who presented with a screening detected right breast distortion on 01/30/18 mammography.  The patient has history of a stereotactic biopsy of the right breast at 9:00 in 2007 with pathology indicating a sclerosing papilloma.  Diagnostic mammogram and ultrasound from 02/14/18 showed a 2.1 x 2.0 x 1.8 cm mass in the right breast at 8:30, 4 cm from nipple, with a small possible satellite lesion at 9:00.  No evidence of right axillary lymphadenopathy.  Biopsy of the two masses on 02/16/18 showed invasive ductal carcinoma with characteristics as described above in the diagnosis. In addition, atypical lobular hyperplasia was seen in the 9:00 lesion. Bilateral breast MRI from 03/01/18 showed the two areas of known malignancy in the lateral portion of the right breast. No additional areas of concern in either breast or any axillary adenopathy.  She underwent hysterectomy with bilateral salpingo-oophorectomy on 03/06/18 due to intermittent vaginal bleeding that had been occurring since October 2018. Final pathology was benign. She states that she has recovered well except for some lower back pain, itching, and severe nausea.  She consulted with Dr. Burr Medico on 03/16/18 and has right breast lumpectomy with sentinel lymph node biopsy scheduled for 04/05/18 with Dr. Barry Dienes. She has  been referred today for discussion of adjuvant radiation treatment. She is accompanied by an Arabic medical interpreter who is helping translate for her today.  On review of systems, the patient reports neuropathy-related leg pain bilaterally. She reports her lower back pain following surgery has resolved. She reports continued itching and nausea since her surgery 3 weeks ago. She reports some lower esophageal pain with swallowing since her surgery and has tried taking Pepcid. She reports chronic anxiety and depression. She is widowed and constantly worries about her children and grandchildren. She has met with a Education officer, museum for this.  PREVIOUS RADIATION THERAPY: No  PAST MEDICAL HISTORY:  has a past medical history of Anxiety and depression (10/15/2014), Arthritis, Brain cancer ([place]), Depression, GERD (gastroesophageal reflux disease), Headache, Seasonal allergies, SVD (spontaneous vaginal delivery), and Unspecified hereditary and idiopathic peripheral neuropathy (05/14/2014).    PAST SURGICAL HISTORY: Past Surgical History:  Procedure Laterality Date  . BREAST EXCISIONAL BIOPSY Right   . BREAST LUMPECTOMY  2007   RIGHT BREAST  . BREAST SURGERY     bx, no cancer   . CATARACT EXTRACTION Bilateral   . DILATATION & CURETTAGE/HYSTEROSCOPY WITH MYOSURE N/A 08/09/2017   Procedure: DILATATION & CURETTAGE/HYSTEROSCOPY WITH MYOSURE;  Surgeon: Emily Filbert, MD;  Location: [facility];  Service: Gynecology;  Laterality: N/A;  . LABIOPLASTY  03/06/2018   Procedure: LABIAPLASTY;  Surgeon: Emily Filbert, MD;  Location: [facility];  Service: Gynecology;;  . VAGINAL HYSTERECTOMY Bilateral 03/06/2018   Procedure: HYSTERECTOMY VAGINAL WITH BILATERAL SALPINGOOPHORECTOMY;  Surgeon: Emily Filbert, MD;  Location: [facility];  Service: Gynecology;  Laterality: Bilateral;  FAMILY HISTORY: family history includes Cancer in her maternal aunt and sister; Diabetes in her mother; Neuropathy in her mother and sister; Other in her  father.  SOCIAL HISTORY:  reports that she has never smoked. She has never used smokeless tobacco. She reports that she does not drink alcohol or use drugs.  ALLERGIES: Patient has no known allergies.  MEDICATIONS:  Current Outpatient Medications  Medication Sig Dispense Refill  . b complex vitamins tablet Take 1 tablet by mouth daily.    . famotidine (PEPCID) 10 MG tablet Take 10 mg by mouth 2 (two) times daily as needed for heartburn.     Marland Kitchen FLUoxetine (PROZAC) 40 MG capsule Take 40 mg by mouth daily.    Marland Kitchen gabapentin (NEURONTIN) 800 MG tablet Take 200 mg by mouth 4 (four) times daily as needed.     . traZODone (DESYREL) 100 MG tablet Take 100 mg by mouth at bedtime.    Marland Kitchen oxyCODONE-acetaminophen (PERCOCET/ROXICET) 5-325 MG tablet Take 1 tablet by mouth every 4 (four) hours as needed for moderate pain. (Patient not taking: Reported on 03/28/2018) 30 tablet 0   No current facility-administered medications for this encounter.     REVIEW OF SYSTEMS: A 10+ POINT REVIEW OF SYSTEMS WAS OBTAINED including neurology, dermatology, psychiatry, cardiac, respiratory, lymph, extremities, GI, GU, Musculoskeletal, constitutional, breasts, reproductive, HEENT.  All pertinent positives are noted in the HPI.  All others are negative.   PHYSICAL EXAM:  weight is 187 lb (84.8 kg). Her temperature is 98.4 F (36.9 C). Her blood pressure is 112/75 and her pulse is 75. Her respiration is 18 and oxygen saturation is 100%.   General: Alert and oriented, in no acute distress. HEENT: Head is normocephalic. Extraocular movements are intact. Oral cavity and oropharynx are clear. She has upper dentures and partials on the bottom. Neck: Neck is supple, no palpable cervical or supraclavicular lymphadenopathy. Heart: Systolic murmur that is loudest at the aorta. Chest: Clear to auscultation bilaterally, with no rhonchi, wheezes, or rales. Abdomen: Soft, nontender, nondistended, with no rigidity or guarding. Extremities: No  cyanosis or edema. She has more adipose tissue in her lower extremities, and she states that this is hereditary among the women in her family. Lymphatics: see Neck Exam Skin: No concerning lesions. Musculoskeletal: Symmetric strength and muscle tone throughout. Neurologic: Cranial nerves II through XII are grossly intact. No obvious focalities. Speech is fluent. Coordination is intact. Psychiatric: Judgment and insight are intact. Affect is appropriate. Breasts: In the LOQ of the right breast, there is thickening that spans about 4 cm in dimension. No palpable masses in the right axilla. No palpable masses appreciated in the left breast or left axilla.   ECOG = 1  0 - Asymptomatic (Fully active, able to carry on all predisease activities without restriction)  1 - Symptomatic but completely ambulatory (Restricted in physically strenuous activity but ambulatory and able to carry out work of a light or sedentary nature. For example, light housework, office work)  2 - Symptomatic, <50% in bed during the day (Ambulatory and capable of all self care but unable to carry out any work activities. Up and about more than 50% of waking hours)  3 - Symptomatic, >50% in bed, but not bedbound (Capable of only limited self-care, confined to bed or chair 50% or more of waking hours)  4 - Bedbound (Completely disabled. Cannot carry on any self-care. Totally confined to bed or chair)  5 - Death   Eustace Pen MM, Creech RH, [place],  et al. [phone]). "Toxicity and response criteria of the [facility]". [place] Oncol. 5 (6): 649-55   LABORATORY DATA:  Lab Results  Component Value Date   WBC 6.3 03/07/2018   HGB 10.3 (L) 03/07/2018   HCT 30.9 (L) 03/07/2018   MCV 88.0 03/07/2018   PLT 156 03/07/2018   CMP     Component Value Date/Time   NA 136 02/28/2018 1155   K 4.3 02/28/2018 1155   CL 102 02/28/2018 1155   CO2 23 02/28/2018 1155   GLUCOSE 107 (H) 02/28/2018 1155   BUN 17  02/28/2018 1155   CREATININE 0.80 02/28/2018 1155   CALCIUM 9.4 02/28/2018 1155   PROT 7.6 05/01/2017 1256   ALBUMIN 4.2 05/01/2017 1256   AST 32 05/01/2017 1256   ALT 29 05/01/2017 1256   ALKPHOS 77 05/01/2017 1256   BILITOT 0.4 05/01/2017 1256   GFRNONAA >60 02/28/2018 1155   GFRAA >60 02/28/2018 1155         RADIOGRAPHY: Mr Breast Bilateral W Wo Contrast Inc Cad  Result Date: 03/01/2018 CLINICAL DATA:  Recent diagnosis of breast cancer in the RIGHT breast. Ultrasound-guided core biopsy of mass in the 8:30 o'clock location of the RIGHT breast showed grade 2 invasive ductal carcinoma. Biopsy of the mass in the 9 o'clock location of the RIGHT breast showed grade 2 invasive ductal carcinoma and lobular neoplasia (atypical lobular hyperplasia). LABS:  None applicable EXAM: BILATERAL BREAST MRI WITH AND WITHOUT CONTRAST TECHNIQUE: Multiplanar, multisequence MR images of both breasts were obtained prior to and following the intravenous administration of 15 ml of MultiHance. THREE-DIMENSIONAL MR IMAGE RENDERING ON INDEPENDENT WORKSTATION: Three-dimensional MR images were rendered by post-processing of the original MR data on an independent workstation. The three-dimensional MR images were interpreted, and findings are reported in the following complete MRI report for this study. Three dimensional images were evaluated at the independent DynaCad workstation COMPARISON:  02/16/2018 and earlier FINDINGS: Breast composition: c. Heterogeneous fibroglandular tissue. Background parenchymal enhancement: Marked. Right breast: Within the LOWER OUTER QUADRANT of the RIGHT breast there is an enhancing round mass with spiculated margins measuring 2.5 x 2.4 centimeters and demonstrating washout type enhancement kinetics. Within this mass there is tissue marker clip artifact. Anterior to this mass, skin retraction. No skin enhancement or continuity of the mass with the skin. Just superior to this mass, there is tissue  marker clip artifact from a coil shaped clip, marking the known malignancy in the 9 o'clock location of the RIGHT breast. There is no parenchymal enhancement in this location, but enhancement in a small mass could be obscured by the coil shaped clip. Left breast: No mass or abnormal enhancement. Lymph nodes: No abnormal appearing lymph nodes. Ancillary findings:  None. IMPRESSION: Two areas of known malignancy in the LATERAL portion of the RIGHT breast. No additional areas of concern in either breast. No axillary adenopathy. RECOMMENDATION: Treatment plan for known RIGHT breast cancer. BI-RADS CATEGORY  6: Known biopsy-proven malignancy. Electronically Signed   By: Nolon Nations M.D.   On: 03/01/2018 14:05      IMPRESSION/PLAN: Right Breast Cancer   It was a pleasure meeting the patient today. We discussed the risks, benefits, and side effects of radiotherapy. She plans for lumpectomy and SLN bx. I recommend radiotherapy to the right breast to reduce her risk of locoregional recurrence by 2/3.  We discussed that radiation would take approximately 4-6 weeks to complete and that I would give the patient a few  weeks to heal following surgery before starting treatment planning. If chemotherapy were to be given, this would precede radiotherapy. We spoke about acute effects including skin irritation and fatigue as well as much less common late effects including internal organ injury or irritation. We spoke about the latest technology that is used to minimize the risk of late effects for patients undergoing radiotherapy to the breast or chest wall. No guarantees of treatment were given. The patient is enthusiastic about proceeding with treatment. I look forward to participating in the patient's care.  I will await her referral back to me for postoperative follow-up and eventual CT simulation/treatment planning.  The patient has a systolic murmur that is loudest at the aorta. I advised her to schedule an annual  exam with her PCP. Faxing note to Dr. Ayesha Rumpf.  I advised her to see her surgeon about her post-hysterectomy itching and nausea.  For her anxiety and depression, I offered a referral to counseling. She declines that now, but will let us know if she would like to pursue this.      __________________________________________   Eppie Gibson, MD  This document serves as a record of services personally performed by Eppie Gibson, MD. It was created on her behalf by Rae Lips, a trained medical scribe. The creation of this record is based on the scribe's personal observations and the provider's statements to them. This document has been checked and approved by the attending provider.

## 2018-04-03 ENCOUNTER — Ambulatory Visit
Admission: RE | Admit: 2018-04-03 | Discharge: 2018-04-03 | Disposition: A | Discharge status: Home / Self Care | Payer: Medicare Other | Source: Ambulatory Visit | Attending: General Surgery | Admitting: General Surgery

## 2018-04-03 DIAGNOSIS — C50811 Malignant neoplasm of overlapping sites of right female breast: Secondary | ICD-10-CM | POA: Diagnosis not present

## 2018-04-03 DIAGNOSIS — C50511 Malignant neoplasm of lower-outer quadrant of right female breast: Secondary | ICD-10-CM

## 2018-04-03 DIAGNOSIS — Z170 Estrogen receptor positive status [ER+]: Principal | ICD-10-CM

## 2018-04-03 NOTE — H&P (Signed)
Amy Roman Location: [facility] Patient #: [id number] DOB: Jul 31, 1951 Widowed / Language: Arabic / Race: Undefined Female   History of Present Illness The patient is a 67 year old female who presents with breast cancer. Pt is a 67 yo F who is referred by Dr., Theda Sers for a new dx of right breast cancer. The patient has a history of a benign breast biopsy in 2007 with ADH and CSL. The lumpectomy appearance changed on this last mammogram and she reports feeling a mass recently. She had diagnostic imaging showing a mass at 8:30 and at 9 o'clock. There was a 2.1 cm mass at 8:30 and a 1.1 cm one at 9 o'clock, 1.2 cm away and 3.6 cm in total dimension. 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 ALH was also present on specimen. breast density is D. She denies breast pain or nipple discharge. She has no personal history of cancer. She also has no family history of breast/ovarian cancer or other cancers.    Right dx mammogram and [place] 02/14/2018 ACR Breast Density Category d: The breast tissue is extremely dense, which lowers the sensitivity of mammography.  FINDINGS: In the lateral aspect of the right breast, posterior depth there is a prominent area of distortion at the approximate 9 o'clock location. While this is in the region of the excisional biopsy that the patient had in 2007, the distortion has become significantly more prominent as compared to the tomosynthesis mammogram performed in September of 2016, and appears to be inferior to the surgical scar.  Mammographic images were processed with CAD.  Physical exam of the lateral right breast demonstrates a well-healed surgical scar on the upper-outer quadrant of the right breast. On the slightly lower outer quadrant of the right breast, there is a prominent dimpling of the skin, with a palpable lump.  Ultrasound of the right breast at 8:30, 4 cm from the  nipple demonstrates an irregular hypoechoic vascular mass with spiculated margins measuring 2.1 x 2.0 x 1.8 cm. There is a small adjacent mass at 9 o'clock, 4 cm from the nipple, which is 1.2 cm away from the dominant mass. The smaller mass measures 1.1 x 0.7 x 0.7 cm. Together, the 2 masses span 3.6 cm of tissue. Ultrasound of the right axilla demonstrates multiple normal-appearing lymph nodes.  IMPRESSION: 1. There is a highly suspicious mass in the right breast at 830 with a small possible satellite lesion at 9 o'clock.  2. No evidence of right axillary lymphadenopathy.   pathology 02/16/2018 Diagnosis 1. Breast, right, needle core biopsy, 8:30 o'clock - INVASIVE DUCTAL CARCINOMA, SEE COMMENT. 2. Breast, right, needle core biopsy, 9 o'clock - INVASIVE DUCTAL CARCINOMA. - LOBULAR NEOPLASIA (ATYPICAL LOBULAR HYPERPLASIA). Microscopic Comment 1. and 2. The carcinoma appears grade 2. E-cadherin is positive in the invasive tumor. There is a small crushed focus of carcinoma in part #2 confirmed by a lack of basal markers (calponin, p63, smooth muscle myosin). Estrogen Receptor: 95%, POSITIVE, STRONG STAINING INTENSITY Progesterone Receptor: 80%, POSITIVE, MODERATE STAINING INTENSITY Proliferation Marker Ki67: 2% HER2 - NEGATIVE RATIO OF HER2/CEP17 SIGNALS    Past Surgical History Breast Biopsy  Right. Breast Mass; Local Excision  Right. Cataract Surgery  Bilateral.  Diagnostic Studies History Colonoscopy  within last year Mammogram  within last year  Allergies  No Known Drug Allergies [02/23/2018]: Allergies Reconciled   Medication History  Tylenol (325MG Tablet, Oral) Active. B Complex (Oral) Active. Pepcid (20MG  Tablet, Oral) Active. Gabapentin (800MG Tablet, Oral) Active. Ibuprofen (600MG Tablet, Oral) Active. Temazepam (30MG Capsule, Oral) Active. Medications Reconciled  Family History  Diabetes Mellitus  Brother, Mother.  Pregnancy / Birth  History  Age of menopause  68-50 Gravida  5 Irregular periods  Length (months) of breastfeeding  7-12 Maternal age  77-25  Other Problems Back Pain     Review of Systems General Present- Weight Gain and Weight Loss. Not Present- Appetite Loss, Chills, Fatigue, Fever and Night Sweats. Skin Present- New Lesions. Not Present- Change in Wart/Mole, Dryness, Hives, Jaundice, Non-Healing Wounds, Rash and Ulcer. Breast Present- Breast Mass. Not Present- Breast Pain, Nipple Discharge and Skin Changes. Cardiovascular Not Present- Chest Pain, Difficulty Breathing Lying Down, Leg Cramps, Palpitations, Rapid Heart Rate, Shortness of Breath and Swelling of Extremities. Gastrointestinal Present- Nausea. Not Present- Abdominal Pain, Bloating, Bloody Stool, Change in Bowel Habits, Chronic diarrhea, Constipation, Difficulty Swallowing, Excessive gas, Gets full quickly at meals, Hemorrhoids, Indigestion, Rectal Pain and Vomiting. Musculoskeletal Present- Back Pain. Not Present- Joint Pain, Joint Stiffness, Muscle Pain, Muscle Weakness and Swelling of Extremities. Neurological Present- Numbness. Not Present- Decreased Memory, Fainting, Headaches, Seizures, Tingling, Tremor, Trouble walking and Weakness. Psychiatric Present- Depression. Not Present- Anxiety, Bipolar, Change in Sleep Pattern, Fearful and Frequent crying.  Vitals Weight: 197.2 lb Height: 59in Body Surface Area: 1.83 m Body Mass Index: 39.83 kg/m  Temp.: 98.42F  Pulse: 85 (Regular)  BP: 136/78 (Sitting, Left Arm, Standard)       Physical Exam General Mental Status-Alert. General Appearance-Consistent with stated age. Hydration-Well hydrated. Voice-Normal.  Head and Neck Head-normocephalic, atraumatic with no lesions or palpable masses. Trachea-midline. Thyroid Gland Characteristics - normal size and consistency.  Eye Eyeball - Bilateral-Extraocular movements intact. Sclera/Conjunctiva -  Bilateral-No scleral icterus.  Chest and Lung Exam Chest and lung exam reveals -quiet, even and easy respiratory effort with no use of accessory muscles and on auscultation, normal breath sounds, no adventitious sounds and normal vocal resonance. Inspection Chest Wall - Normal. Back - normal.  Breast Note: right breast smaller than left. There is no discrete palpable mass in the region of concern, but the breast below her prior incision feels denser and thicker. no LAD, no nipple retraction/discharge, no skin dimpling. left breast normal. both ptotic. dense tissue centrally and near cancer.   Cardiovascular Cardiovascular examination reveals -normal heart sounds, regular rate and rhythm with no murmurs and normal pedal pulses bilaterally.  Abdomen Inspection Inspection of the abdomen reveals - No Hernias. Palpation/Percussion Palpation and Percussion of the abdomen reveal - Soft, Non Tender, No Rebound tenderness, No Rigidity (guarding) and No hepatosplenomegaly. Auscultation Auscultation of the abdomen reveals - Bowel sounds normal.  Neurologic Neurologic evaluation reveals -alert and oriented x 3 with no impairment of recent or remote memory. Mental Status-Normal.  Musculoskeletal Global Assessment -Note: no gross deformities.  Normal Exam - Left-Upper Extremity Strength Normal and Lower Extremity Strength Normal. Normal Exam - Right-Upper Extremity Strength Normal and Lower Extremity Strength Normal. Note: of note, right leg much larger than left, pt reports congenital and genetic, present in mother and grandmother and a few cousins.   Lymphatic Head & Neck  General Head & Neck Lymphatics: Bilateral - Description - Normal. Axillary  General Axillary Region: Bilateral - Description - Normal. Tenderness - Non Tender. Femoral & Inguinal  Generalized Femoral & Inguinal Lymphatics: Bilateral - Description - No Generalized lymphadenopathy.    Assessment  & Plan PRIMARY CANCER OF LOWER OUTER QUADRANT OF RIGHT FEMALE BREAST (C50.511) Impression:  Given multifocal nature of cancer, breast density D, and ALH on one of the biopsies, will get breast MRI. Would like to make sure this is not confluent cancer. She would like lumpectomy if possible, despite description of much smaller right breast.  I discussed that we could consider seed bracketed right lumpectomy wtih sentinel lymph node biopsy if the MRI does not show any surprises. I discussed what happens wtih this procedure and the multiple steps including seed placement at the breast center. I reviewed risks of bleedign, infection, need for additional surgery, need for additional treatment, recurrent cancer.  If we need to proceed wtih mastectomy, I will need to see her back. I discussed that it is common to need additional biopsies after MRI.  I will make referrals to medical and radiation oncology.  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 If she has complications from this procedure, we will plan tamoxifen treatment up front.  visit was carried out with interpreter. Current Plans Pt Education - [facility] breast cancer surgery: discussed with patient and provided information. Referred to Radiation Oncology, for evaluation and follow up (Radiation Oncology). Routine. Referred to Oncology, for evaluation and follow up (Oncology). Routine. MR BREAST BILAT WO/W CON (C0505) (new multifocal right breast cancer, breast density D, ALH)

## 2018-04-04 NOTE — Progress Notes (Signed)
Pt's son in to pick up Ensure Pre op drink and Surgical Scrub. Instructions reviewed.

## 2018-04-05 ENCOUNTER — Encounter (HOSPITAL_BASED_OUTPATIENT_CLINIC_OR_DEPARTMENT_OTHER)
Admission: RE | Disposition: A | Discharge status: Home / Self Care | Payer: Self-pay | Source: Ambulatory Visit | Attending: General Surgery

## 2018-04-05 ENCOUNTER — Ambulatory Visit
Admission: RE | Admit: 2018-04-05 | Discharge: 2018-04-05 | Disposition: A | Discharge status: Home / Self Care | Payer: Medicare Other | Source: Ambulatory Visit | Attending: General Surgery | Admitting: General Surgery

## 2018-04-05 ENCOUNTER — Ambulatory Visit (HOSPITAL_BASED_OUTPATIENT_CLINIC_OR_DEPARTMENT_OTHER): Payer: Medicare Other | Admitting: Anesthesiology

## 2018-04-05 ENCOUNTER — Ambulatory Visit (HOSPITAL_COMMUNITY)
Admission: RE | Admit: 2018-04-05 | Discharge: 2018-04-05 | Disposition: A | Discharge status: Home / Self Care | Payer: Medicare Other | Source: Ambulatory Visit | Attending: General Surgery | Admitting: General Surgery

## 2018-04-05 ENCOUNTER — Other Ambulatory Visit: Payer: Self-pay

## 2018-04-05 ENCOUNTER — Encounter (HOSPITAL_BASED_OUTPATIENT_CLINIC_OR_DEPARTMENT_OTHER): Payer: Self-pay

## 2018-04-05 ENCOUNTER — Ambulatory Visit (HOSPITAL_BASED_OUTPATIENT_CLINIC_OR_DEPARTMENT_OTHER)
Admission: RE | Admit: 2018-04-05 | Discharge: 2018-04-05 | Disposition: A | Discharge status: Home / Self Care | Payer: Medicare Other | Source: Ambulatory Visit | Attending: General Surgery | Admitting: General Surgery

## 2018-04-05 DIAGNOSIS — K219 Gastro-esophageal reflux disease without esophagitis: Secondary | ICD-10-CM | POA: Diagnosis not present

## 2018-04-05 DIAGNOSIS — Z9842 Cataract extraction status, left eye: Secondary | ICD-10-CM | POA: Diagnosis not present

## 2018-04-05 DIAGNOSIS — G629 Polyneuropathy, unspecified: Secondary | ICD-10-CM | POA: Insufficient documentation

## 2018-04-05 DIAGNOSIS — C50511 Malignant neoplasm of lower-outer quadrant of right female breast: Secondary | ICD-10-CM

## 2018-04-05 DIAGNOSIS — Z170 Estrogen receptor positive status [ER+]: Principal | ICD-10-CM

## 2018-04-05 DIAGNOSIS — F418 Other specified anxiety disorders: Secondary | ICD-10-CM | POA: Diagnosis not present

## 2018-04-05 DIAGNOSIS — Z9889 Other specified postprocedural states: Secondary | ICD-10-CM | POA: Diagnosis not present

## 2018-04-05 DIAGNOSIS — Z9841 Cataract extraction status, right eye: Secondary | ICD-10-CM | POA: Insufficient documentation

## 2018-04-05 DIAGNOSIS — D0581 Other specified type of carcinoma in situ of right breast: Secondary | ICD-10-CM | POA: Diagnosis not present

## 2018-04-05 DIAGNOSIS — N852 Hypertrophy of uterus: Secondary | ICD-10-CM | POA: Diagnosis not present

## 2018-04-05 DIAGNOSIS — C50911 Malignant neoplasm of unspecified site of right female breast: Secondary | ICD-10-CM | POA: Diagnosis not present

## 2018-04-05 DIAGNOSIS — Z6839 Body mass index (BMI) 39.0-39.9, adult: Secondary | ICD-10-CM | POA: Insufficient documentation

## 2018-04-05 DIAGNOSIS — C773 Secondary and unspecified malignant neoplasm of axilla and upper limb lymph nodes: Secondary | ICD-10-CM | POA: Diagnosis not present

## 2018-04-05 DIAGNOSIS — G709 Myoneural disorder, unspecified: Secondary | ICD-10-CM | POA: Insufficient documentation

## 2018-04-05 DIAGNOSIS — Z79899 Other long term (current) drug therapy: Secondary | ICD-10-CM | POA: Insufficient documentation

## 2018-04-05 DIAGNOSIS — G8918 Other acute postprocedural pain: Secondary | ICD-10-CM | POA: Diagnosis not present

## 2018-04-05 DIAGNOSIS — C779 Secondary and unspecified malignant neoplasm of lymph node, unspecified: Secondary | ICD-10-CM | POA: Diagnosis not present

## 2018-04-05 HISTORY — PX: BREAST LUMPECTOMY WITH RADIOACTIVE SEED AND SENTINEL LYMPH NODE BIOPSY: SHX6550

## 2018-04-05 SURGERY — BREAST LUMPECTOMY WITH RADIOACTIVE SEED AND SENTINEL LYMPH NODE BIOPSY
Anesthesia: General | Site: Breast | Laterality: Right

## 2018-04-05 MED ORDER — GABAPENTIN 300 MG PO CAPS
300.0000 mg | ORAL_CAPSULE | ORAL | Status: AC
  Administered 2018-04-05: 300 mg via ORAL

## 2018-04-05 MED ORDER — PROMETHAZINE HCL 25 MG/ML IJ SOLN: 6.2500 mg | INTRAMUSCULAR | Status: DC | PRN

## 2018-04-05 MED ORDER — TECHNETIUM TC 99M SULFUR COLLOID FILTERED
1.0000 | Freq: Once | INTRAVENOUS | Status: AC | PRN
  Administered 2018-04-05: 1 via INTRADERMAL

## 2018-04-05 MED ORDER — GABAPENTIN 300 MG PO CAPS
ORAL_CAPSULE | ORAL | Status: AC
  Filled 2018-04-05: qty 1

## 2018-04-05 MED ORDER — METHYLENE BLUE 0.5 % INJ SOLN
INTRAVENOUS | Status: DC | PRN
  Administered 2018-04-05: 5 mL via INTRAMUSCULAR

## 2018-04-05 MED ORDER — LACTATED RINGERS IV SOLN
INTRAVENOUS | Status: DC
  Administered 2018-04-05 (×2): via INTRAVENOUS

## 2018-04-05 MED ORDER — SCOPOLAMINE 1 MG/3DAYS TD PT72
1.0000 | MEDICATED_PATCH | Freq: Once | TRANSDERMAL | Status: DC | PRN
  Administered 2018-04-05: 1.5 mg via TRANSDERMAL

## 2018-04-05 MED ORDER — LIDOCAINE-EPINEPHRINE (PF) 1 %-1:200000 IJ SOLN
INTRAMUSCULAR | Status: DC | PRN
  Administered 2018-04-05: 10 mL

## 2018-04-05 MED ORDER — DIPHENHYDRAMINE HCL 50 MG/ML IJ SOLN
INTRAMUSCULAR | Status: AC
  Filled 2018-04-05: qty 1

## 2018-04-05 MED ORDER — PROPOFOL 10 MG/ML IV BOLUS
INTRAVENOUS | Status: DC | PRN
  Administered 2018-04-05: 150 mg via INTRAVENOUS
  Administered 2018-04-05 (×2): 50 mg via INTRAVENOUS

## 2018-04-05 MED ORDER — ACETAMINOPHEN 500 MG PO TABS
ORAL_TABLET | ORAL | Status: AC
  Filled 2018-04-05: qty 2

## 2018-04-05 MED ORDER — MIDAZOLAM HCL 2 MG/2ML IJ SOLN
1.0000 mg | INTRAMUSCULAR | Status: DC | PRN
  Administered 2018-04-05: 2 mg via INTRAVENOUS

## 2018-04-05 MED ORDER — ACETAMINOPHEN 500 MG PO TABS
1000.0000 mg | ORAL_TABLET | ORAL | Status: AC
  Administered 2018-04-05: 1000 mg via ORAL

## 2018-04-05 MED ORDER — CEFAZOLIN SODIUM-DEXTROSE 2-4 GM/100ML-% IV SOLN
INTRAVENOUS | Status: AC
  Filled 2018-04-05: qty 100

## 2018-04-05 MED ORDER — OXYCODONE HCL 5 MG PO TABS: 5.0000 mg | ORAL_TABLET | ORAL | 0 refills | Status: DC | PRN

## 2018-04-05 MED ORDER — FENTANYL CITRATE (PF) 100 MCG/2ML IJ SOLN
INTRAMUSCULAR | Status: AC
  Filled 2018-04-05: qty 2

## 2018-04-05 MED ORDER — ONDANSETRON HCL 4 MG/2ML IJ SOLN
INTRAMUSCULAR | Status: AC
  Filled 2018-04-05: qty 2

## 2018-04-05 MED ORDER — CEFAZOLIN SODIUM-DEXTROSE 2-4 GM/100ML-% IV SOLN
2.0000 g | INTRAVENOUS | Status: AC
  Administered 2018-04-05: 2 g via INTRAVENOUS

## 2018-04-05 MED ORDER — BUPIVACAINE-EPINEPHRINE (PF) 0.5% -1:200000 IJ SOLN
INTRAMUSCULAR | Status: DC | PRN
  Administered 2018-04-05: 30 mL

## 2018-04-05 MED ORDER — DEXAMETHASONE SODIUM PHOSPHATE 10 MG/ML IJ SOLN
INTRAMUSCULAR | Status: AC
  Filled 2018-04-05: qty 1

## 2018-04-05 MED ORDER — PROPOFOL 10 MG/ML IV BOLUS
INTRAVENOUS | Status: AC
  Filled 2018-04-05: qty 20

## 2018-04-05 MED ORDER — PROPOFOL 500 MG/50ML IV EMUL
INTRAVENOUS | Status: DC | PRN
  Administered 2018-04-05: 250 ug/kg/min via INTRAVENOUS

## 2018-04-05 MED ORDER — DEXAMETHASONE SODIUM PHOSPHATE 4 MG/ML IJ SOLN
INTRAMUSCULAR | Status: DC | PRN
  Administered 2018-04-05: 10 mg via INTRAVENOUS

## 2018-04-05 MED ORDER — CELECOXIB 200 MG PO CAPS
200.0000 mg | ORAL_CAPSULE | ORAL | Status: AC
  Administered 2018-04-05: 200 mg via ORAL

## 2018-04-05 MED ORDER — FENTANYL CITRATE (PF) 100 MCG/2ML IJ SOLN
25.0000 ug | INTRAMUSCULAR | Status: DC | PRN
  Administered 2018-04-05: 50 ug via INTRAVENOUS
  Administered 2018-04-05: 25 ug via INTRAVENOUS

## 2018-04-05 MED ORDER — SCOPOLAMINE 1 MG/3DAYS TD PT72
MEDICATED_PATCH | TRANSDERMAL | Status: AC
  Filled 2018-04-05: qty 1

## 2018-04-05 MED ORDER — DIPHENHYDRAMINE HCL 50 MG/ML IJ SOLN
INTRAMUSCULAR | Status: DC | PRN
  Administered 2018-04-05: 12.5 mg via INTRAVENOUS

## 2018-04-05 MED ORDER — CHLORHEXIDINE GLUCONATE CLOTH 2 % EX PADS: 6.0000 | MEDICATED_PAD | Freq: Once | CUTANEOUS | Status: DC

## 2018-04-05 MED ORDER — CELECOXIB 200 MG PO CAPS
ORAL_CAPSULE | ORAL | Status: AC
  Filled 2018-04-05: qty 1

## 2018-04-05 MED ORDER — FENTANYL CITRATE (PF) 100 MCG/2ML IJ SOLN
50.0000 ug | INTRAMUSCULAR | Status: AC | PRN
  Administered 2018-04-05: 25 ug via INTRAVENOUS
  Administered 2018-04-05: 100 ug via INTRAVENOUS
  Administered 2018-04-05: 50 ug via INTRAVENOUS
  Administered 2018-04-05 (×4): 25 ug via INTRAVENOUS

## 2018-04-05 MED ORDER — MIDAZOLAM HCL 2 MG/2ML IJ SOLN
INTRAMUSCULAR | Status: AC
  Filled 2018-04-05: qty 2

## 2018-04-05 SURGICAL SUPPLY — 68 items
ADH SKN CLS APL DERMABOND .7 (GAUZE/BANDAGES/DRESSINGS) ×1
BINDER BREAST LRG (GAUZE/BANDAGES/DRESSINGS) IMPLANT
BINDER BREAST MEDIUM (GAUZE/BANDAGES/DRESSINGS) IMPLANT
BINDER BREAST XLRG (GAUZE/BANDAGES/DRESSINGS) ×2 IMPLANT
BINDER BREAST XXLRG (GAUZE/BANDAGES/DRESSINGS) IMPLANT
BLADE SURG 10 STRL SS (BLADE) ×3 IMPLANT
BLADE SURG 15 STRL LF DISP TIS (BLADE) ×2 IMPLANT
BLADE SURG 15 STRL SS (BLADE) ×6
BNDG COHESIVE 4X5 TAN STRL (GAUZE/BANDAGES/DRESSINGS) ×3 IMPLANT
CANISTER SUC SOCK COL 7IN (MISCELLANEOUS) IMPLANT
CANISTER SUCT 1200ML W/VALVE (MISCELLANEOUS) ×3 IMPLANT
CHLORAPREP W/TINT 26ML (MISCELLANEOUS) ×3 IMPLANT
CLIP VESOCCLUDE LG 6/CT (CLIP) ×3 IMPLANT
CLIP VESOCCLUDE MED 6/CT (CLIP) ×6 IMPLANT
CLIP VESOCCLUDE SM WIDE 6/CT (CLIP) IMPLANT
CLOSURE WOUND 1/2 X4 (GAUZE/BANDAGES/DRESSINGS) ×1
COVER MAYO STAND STRL (DRAPES) ×3 IMPLANT
COVER PROBE W GEL 5X96 (DRAPES) ×3 IMPLANT
DECANTER SPIKE VIAL GLASS SM (MISCELLANEOUS) IMPLANT
DERMABOND ADVANCED (GAUZE/BANDAGES/DRESSINGS) ×2
DERMABOND ADVANCED .7 DNX12 (GAUZE/BANDAGES/DRESSINGS) ×1 IMPLANT
DEVICE DUBIN W/COMP PLATE 8390 (MISCELLANEOUS) ×3 IMPLANT
DRAPE UTILITY XL STRL (DRAPES) ×3 IMPLANT
DRSG PAD ABDOMINAL 8X10 ST (GAUZE/BANDAGES/DRESSINGS) ×3 IMPLANT
ELECT COATED BLADE 2.86 ST (ELECTRODE) ×3 IMPLANT
ELECT REM PT RETURN 9FT ADLT (ELECTROSURGICAL) ×3
ELECTRODE REM PT RTRN 9FT ADLT (ELECTROSURGICAL) ×1 IMPLANT
GAUZE SPONGE 4X4 12PLY STRL LF (GAUZE/BANDAGES/DRESSINGS) ×3 IMPLANT
GLOVE BIO SURGEON STRL SZ 6 (GLOVE) ×6 IMPLANT
GLOVE BIO SURGEON STRL SZ 6.5 (GLOVE) ×1 IMPLANT
GLOVE BIO SURGEONS STRL SZ 6.5 (GLOVE) ×1
GLOVE BIOGEL PI IND STRL 6.5 (GLOVE) ×3 IMPLANT
GLOVE BIOGEL PI IND STRL 7.0 (GLOVE) IMPLANT
GLOVE BIOGEL PI INDICATOR 6.5 (GLOVE) ×6
GLOVE BIOGEL PI INDICATOR 7.0 (GLOVE) ×2
GLOVE ECLIPSE 6.5 STRL STRAW (GLOVE) ×3 IMPLANT
GOWN STRL REUS W/ TWL LRG LVL3 (GOWN DISPOSABLE) ×1 IMPLANT
GOWN STRL REUS W/TWL 2XL LVL3 (GOWN DISPOSABLE) ×3 IMPLANT
GOWN STRL REUS W/TWL LRG LVL3 (GOWN DISPOSABLE) ×9
KIT MARKER MARGIN INK (KITS) ×3 IMPLANT
LIGHT WAVEGUIDE WIDE FLAT (MISCELLANEOUS) ×3 IMPLANT
NDL SAFETY ECLIPSE 18X1.5 (NEEDLE) IMPLANT
NEEDLE HYPO 18GX1.5 SHARP (NEEDLE)
NEEDLE HYPO 25X1 1.5 SAFETY (NEEDLE) ×6 IMPLANT
NS IRRIG 1000ML POUR BTL (IV SOLUTION) ×3 IMPLANT
PACK BASIN DAY SURGERY FS (CUSTOM PROCEDURE TRAY) ×3 IMPLANT
PACK UNIVERSAL I (CUSTOM PROCEDURE TRAY) ×3 IMPLANT
PENCIL BUTTON HOLSTER BLD 10FT (ELECTRODE) ×3 IMPLANT
SLEEVE SCD COMPRESS KNEE MED (MISCELLANEOUS) ×3 IMPLANT
SPONGE LAP 18X18 RF (DISPOSABLE) ×6 IMPLANT
STAPLER VISISTAT 35W (STAPLE) IMPLANT
STOCKINETTE IMPERVIOUS LG (DRAPES) ×3 IMPLANT
STRIP CLOSURE SKIN 1/2X4 (GAUZE/BANDAGES/DRESSINGS) ×2 IMPLANT
SUT ETHILON 2 0 FS 18 (SUTURE) IMPLANT
SUT MNCRL AB 4-0 PS2 18 (SUTURE) ×6 IMPLANT
SUT MON AB 5-0 PS2 18 (SUTURE) IMPLANT
SUT SILK 2 0 SH (SUTURE) IMPLANT
SUT VIC AB 2-0 SH 27 (SUTURE) ×3
SUT VIC AB 2-0 SH 27XBRD (SUTURE) ×1 IMPLANT
SUT VIC AB 3-0 SH 27 (SUTURE)
SUT VIC AB 3-0 SH 27X BRD (SUTURE) IMPLANT
SUT VICRYL 3-0 CR8 SH (SUTURE) ×3 IMPLANT
SYR CONTROL 10ML LL (SYRINGE) ×6 IMPLANT
TOWEL GREEN STERILE FF (TOWEL DISPOSABLE) ×3 IMPLANT
TOWEL OR NON WOVEN STRL DISP B (DISPOSABLE) ×3 IMPLANT
TUBE CONNECTING 20'X1/4 (TUBING) ×1
TUBE CONNECTING 20X1/4 (TUBING) ×2 IMPLANT
YANKAUER SUCT BULB TIP NO VENT (SUCTIONS) ×3 IMPLANT

## 2018-04-05 NOTE — Transfer of Care (Signed)
Immediate Anesthesia Transfer of Care Note  Patient: Amy Roman  Procedure(s) Performed: RIGHT BREAST SEED BRACKETED LUMPECTOMY WITH SENTINEL LYMPH NODE BIOPSY (Right Breast)  Patient Location: PACU  Anesthesia Type:General  Level of Consciousness: awake  Airway & Oxygen Therapy: Patient Spontanous Breathing and Patient connected to face mask oxygen  Post-op Assessment: Report given to RN and Post -op Vital signs reviewed and stable  Post vital signs: Reviewed and stable  Last Vitals:  Vitals Value Taken Time  BP    Temp    Pulse 66 04/05/2018  1:10 PM  Resp 11 04/05/2018  1:10 PM  SpO2 100 % 04/05/2018  1:10 PM  Vitals shown include unvalidated device data.  Last Pain:  Vitals:   04/05/18 1048  TempSrc:   PainSc: 0-No pain      Patients Stated Pain Goal: 4 ([id number])  Complications: No apparent anesthesia complications

## 2018-04-05 NOTE — Anesthesia Procedure Notes (Signed)
Procedure Name: LMA Insertion Date/Time: 04/05/2018 11:44 AM Performed by: Lieutenant Diego, CRNA Pre-anesthesia Checklist: Patient identified, Emergency Drugs available, Suction available and Patient being monitored Patient Re-evaluated:Patient Re-evaluated prior to induction Oxygen Delivery Method: Circle system utilized Preoxygenation: Pre-oxygenation with 100% oxygen Induction Type: IV induction Ventilation: Mask ventilation without difficulty LMA: LMA inserted LMA Size: 4.0 Number of attempts: 1 Placement Confirmation: positive ETCO2 and breath sounds checked- equal and bilateral Tube secured with: Tape Dental Injury: Teeth and Oropharynx as per pre-operative assessment

## 2018-04-05 NOTE — Op Note (Signed)
Right Breast Radioactive seed bracketed lumpectomy and sentinel lymph node biopsy  Indications: This patient presents with history of right breast cancer, multifocal, both LOQ, grade 2, cT2N0, +/+/-  Pre-operative Diagnosis: Right breast cancer  Post-operative Diagnosis: Same  Surgeon: Stark Klein   Anesthesia: General endotracheal anesthesia  ASA Class: 2  Procedure Details  The patient was seen in the Holding Room. The risks, benefits, complications, treatment options, and expected outcomes were discussed with the patient. The possibilities of bleeding, infection, the need for additional procedures, failure to diagnose a condition, and creating a complication requiring transfusion or operation were discussed with the patient. The patient concurred with the proposed plan, giving informed consent.  The site of surgery properly noted/marked. The patient was taken to Operating Room # 8, identified, and the procedure verified as Right Breast seed bracketed Lumpectomy. A Time Out was held and the above information confirmed.  The right arm, breast, and chest were prepped and draped in standard fashion. The lumpectomy was performed by creating a circumlinear transverse incision in the lower outer quadrant location near the previously placed radioactive seed.  A crescent of skin was removed that was adherent to the cancer.  Dissection was carried down to around the point of maximum signal intensity. The cautery was used to perform the dissection.  Hemostasis was achieved with cautery. The edges of the cavity were marked with large clips, with one each medial, lateral, inferior and superior, and two clips posteriorly.   The specimen was inked with the margin marker paint kit.    Specimen radiography confirmed inclusion of the mammographic lesion, the clips, and the seeds.  The background signal in the breast was zero.  The wound was irrigated and closed with 3-0 vicryl in layers and 4-0 monocryl  subcuticular suture.    Using a hand-held gamma probe, right axillary sentinel nodes were identified transcutaneously.  An oblique incision was created below the axillary hairline.  Dissection was carried through the clavipectoral fascia.  Two deep level 2 axillary sentinel nodes were removed.  Counts per second were 20 and 5.    The background count was 0 cps.  However, there were several additional palpable lymph nodes in the axilla that were removed togther.  The wound was irrigated.  Hemostasis was achieved with cautery.  The axillary incision was closed with a 3-0 vicryl deep dermal interrupted sutures and a 4-0 monocryl subcuticular closure.    Sterile dressings were applied. At the end of the operation, all sponge, instrument, and needle counts were correct.  Findings: grossly clear surgical margins and no adenopathy, anterior margin is skin, posterior margin is pectoralis.    Estimated Blood Loss:  min         Specimens: right breast lumpectomy with seeds and three plus axillary sentinel lymph nodes.             Complications:  None; patient tolerated the procedure well.         Disposition: PACU - hemodynamically stable.         Condition: stable

## 2018-04-05 NOTE — Progress Notes (Signed)
Assisted Dr. Tobias Alexander with right, ultrasound guided, pectoralis block and nuclear med procedure . Side rails up, monitors on throughout procedure. See vital signs in flow sheet. Tolerated Procedure well.

## 2018-04-05 NOTE — Anesthesia Postprocedure Evaluation (Signed)
Anesthesia Post Note  Patient: Amy Roman  Procedure(s) Performed: RIGHT BREAST SEED BRACKETED LUMPECTOMY WITH SENTINEL LYMPH NODE BIOPSY (Right Breast)     Patient location during evaluation: PACU Anesthesia Type: General Level of consciousness: sedated Pain management: pain level controlled Vital Signs Assessment: post-procedure vital signs reviewed and stable Respiratory status: spontaneous breathing and respiratory function stable Cardiovascular status: stable Postop Assessment: no apparent nausea or vomiting Anesthetic complications: no    Last Vitals:  Vitals:   04/05/18 1415 04/05/18 1430  BP: 135/62 128/71  Pulse: 66 69  Resp: 12 18  Temp:    SpO2: 96% 96%    Last Pain:  Vitals:   04/05/18 1430  TempSrc:   PainSc: 5                  Doretta Remmert DANIEL

## 2018-04-05 NOTE — Anesthesia Preprocedure Evaluation (Addendum)
Anesthesia Evaluation  Patient identified by MRN, date of birth, ID band Patient awake    Reviewed: Allergy & Precautions, NPO status , Patient's Chart, lab work & pertinent test results  Airway Mallampati: III  TM Distance: >3 FB Neck ROM: Full    Dental no notable dental hx. (+) Teeth Intact   Pulmonary neg pulmonary ROS,    Pulmonary exam normal breath sounds clear to auscultation       Cardiovascular negative cardio ROS Normal cardiovascular exam Rhythm:Regular Rate:Normal     Neuro/Psych  Headaches, PSYCHIATRIC DISORDERS Anxiety Depression Peripheral neuropathy  Neuromuscular disease    GI/Hepatic Neg liver ROS, GERD  Medicated and Controlled,  Endo/Other  Morbid obesity  Renal/GU negative Renal ROS  negative genitourinary   Musculoskeletal  (+) Arthritis , Osteoarthritis,    Abdominal (+) + obese,   Peds  Hematology negative hematology ROS (+)   Anesthesia Other Findings   Reproductive/Obstetrics DUB                             Anesthesia Physical  Anesthesia Plan  ASA: II  Anesthesia Plan: General   Post-op Pain Management:  Regional for Post-op pain   Induction: Intravenous  PONV Risk Score and Plan: 4 or greater and Ondansetron, Dexamethasone, Scopolamine patch - Pre-op and TIVA  Airway Management Planned: LMA  Additional Equipment:   Intra-op Plan:   Post-operative Plan: Extubation in OR  Informed Consent: I have reviewed the patients History and Physical, chart, labs and discussed the procedure including the risks, benefits and alternatives for the proposed anesthesia with the patient or authorized representative who has indicated his/her understanding and acceptance.   Dental advisory given  Plan Discussed with: CRNA, Anesthesiologist and Surgeon  Anesthesia Plan Comments:        Anesthesia Quick Evaluation

## 2018-04-05 NOTE — Anesthesia Procedure Notes (Signed)
Anesthesia Regional Block: Pectoralis block   Pre-Anesthetic Checklist: ,, timeout performed, Correct Patient, Correct Site, Correct Laterality, Correct Procedure, Correct Position, site marked, Risks and benefits discussed,  Surgical consent,  Pre-op evaluation,  At surgeon's request and post-op pain management  Laterality: Right  Prep: chloraprep       Needles:  Injection technique: Single-shot  Needle Type: Echogenic Stimulator Needle     Needle Length: 10cm  Needle Gauge: 21     Additional Needles:   Narrative:  Start time: 04/05/2018 10:38 AM End time: 04/05/2018 10:48 AM Injection made incrementally with aspirations every 5 mL.  Performed by: Personally

## 2018-04-05 NOTE — Discharge Instructions (Addendum)
NO TYLENOL BEFORE 4:30 PM!        [employer] Phone Number [phone]  BREAST BIOPSY/ PARTIAL MASTECTOMY: POST OP INSTRUCTIONS  Always review your discharge instruction sheet given to you by the facility where your surgery was performed.  IF YOU HAVE DISABILITY OR FAMILY LEAVE FORMS, YOU MUST BRING THEM TO THE OFFICE FOR PROCESSING.  DO NOT GIVE THEM TO YOUR DOCTOR.  1. A prescription for pain medication may be given to you upon discharge.  Take your pain medication as prescribed, if needed.  If narcotic pain medicine is not needed, then you may take acetaminophen (Tylenol) or ibuprofen (Advil) as needed. 2. Take your usually prescribed medications unless otherwise directed 3. If you need a refill on your pain medication, please contact your pharmacy.  They will contact our office to request authorization.  Prescriptions will not be filled after 5pm or on week-ends. 4. You should eat very light the first 24 hours after surgery, such as soup, crackers, pudding, etc.  Resume your normal diet the day after surgery. 5. Most patients will experience some swelling and bruising in the breast.  Ice packs and a good support bra will help.  Swelling and bruising can take several days to resolve.  6. It is common to experience some constipation if taking pain medication after surgery.  Increasing fluid intake and taking a stool softener will usually help or prevent this problem from occurring.  A mild laxative (Milk of Magnesia or Miralax) should be taken according to package directions if there are no bowel movements after 48 hours. 7. Unless discharge instructions indicate otherwise, you may remove your bandages 48 hours after surgery, and you may shower at that time.  You may have steri-strips (small skin tapes) in place directly over the incision.  These strips should be left on the skin for 7-10 days.   Any sutures or staples will be removed at the office during your  follow-up visit. 8. ACTIVITIES:  You may resume regular daily activities (gradually increasing) beginning the next day.  Wearing a good support bra or sports bra (or the breast binder) minimizes pain and swelling.  You may have sexual intercourse when it is comfortable. a. You may drive when you no longer are taking prescription pain medication, you can comfortably wear a seatbelt, and you can safely maneuver your car and apply brakes. b. RETURN TO WORK:  __________1 week_______________ 9. You should see your doctor in the office for a follow-up appointment approximately two weeks after your surgery.  Your doctors nurse will typically make your follow-up appointment when she calls you with your pathology report.  Expect your pathology report 2-3 business days after your surgery.  You may call to check if you do not hear from [place] after three days.   WHEN TO CALL YOUR DOCTOR: 1. Fever over 101.0 2. Nausea and/or vomiting. 3. Extreme swelling or bruising. 4. Continued bleeding from incision. 5. Increased pain, redness, or drainage from the incision.  The clinic staff is available to answer your questions during regular business hours.  Please dont hesitate to call and ask to speak to one of the nurses for clinical concerns.  If you have a medical emergency, go to the nearest emergency room or call 911.  A surgeon from [facility] is always on call at the hospital.  For further questions, please visit [email or web address]          Post Anesthesia Home Care Instructions  Activity: Get plenty of rest for the remainder of the day. A responsible individual must stay with you for 24 hours following the procedure.  For the next 24 hours, DO NOT: -Drive a car -Paediatric nurse -Drink alcoholic beverages -Take any medication unless instructed by your physician -Make any legal decisions or sign important papers.  Meals: Start with liquid foods such as gelatin or soup.  Progress to regular foods as tolerated. Avoid greasy, spicy, heavy foods. If nausea and/or vomiting occur, drink only clear liquids until the nausea and/or vomiting subsides. Call your physician if vomiting continues.  Special Instructions/Symptoms: Your throat may feel dry or sore from the anesthesia or the breathing tube placed in your throat during surgery. If this causes discomfort, gargle with warm salt water. The discomfort should disappear within 24 hours.  If you had a scopolamine patch placed behind your ear for the management of post- operative nausea and/or vomiting:  1. The medication in the patch is effective for 72 hours, after which it should be removed.  Wrap patch in a tissue and discard in the trash. Wash hands thoroughly with soap and water. 2. You may remove the patch earlier than 72 hours if you experience unpleasant side effects which may include dry mouth, dizziness or visual disturbances. 3. Avoid touching the patch. Wash your hands with soap and water after contact with the patch.

## 2018-04-05 NOTE — Interval H&P Note (Signed)
History and Physical Interval Note:  04/05/2018 11:32 AM  Amy Roman  has presented today for surgery, with the diagnosis of right breast cancer  The various methods of treatment have been discussed with the patient and family. After consideration of risks, benefits and other options for treatment, the patient has consented to  Procedure(s): RIGHT BREAST SEED BRACKETED LUMPECTOMY WITH SENTINEL LYMPH NODE BIOPSY (Right) as a surgical intervention .  The patient's history has been reviewed, patient examined, no change in status, stable for surgery.  I have reviewed the patient's chart and labs.  Questions were answered to the patient's satisfaction.     Stark Klein

## 2018-04-06 ENCOUNTER — Encounter (HOSPITAL_BASED_OUTPATIENT_CLINIC_OR_DEPARTMENT_OTHER): Payer: Self-pay | Admitting: General Surgery

## 2018-04-06 ENCOUNTER — Ambulatory Visit: Payer: Self-pay | Admitting: Obstetrics & Gynecology

## 2018-04-10 ENCOUNTER — Telehealth (INDEPENDENT_AMBULATORY_CARE_PROVIDER_SITE_OTHER): Payer: Self-pay

## 2018-04-10 NOTE — Telephone Encounter (Signed)
Talked with Marisol with [employer] and she stated that patient's application did not meet the program's eligibility requirements.  Patient will not be able to receive Monovisc through [employer] at this time.  Patient was notified.

## 2018-04-11 ENCOUNTER — Telehealth: Payer: Self-pay | Admitting: General Surgery

## 2018-04-11 ENCOUNTER — Other Ambulatory Visit: Payer: Self-pay | Admitting: General Surgery

## 2018-04-11 ENCOUNTER — Telehealth: Payer: Self-pay | Admitting: Hematology

## 2018-04-11 NOTE — Telephone Encounter (Signed)
Discussed path with patient via interpreter.  Will need ALND.  Writing orders.

## 2018-04-11 NOTE — Telephone Encounter (Signed)
Spoke with patients son regarding appointment per 6/19 sch msg

## 2018-04-12 ENCOUNTER — Encounter: Payer: Self-pay | Admitting: Hematology

## 2018-04-12 ENCOUNTER — Telehealth: Payer: Self-pay

## 2018-04-12 ENCOUNTER — Inpatient Hospital Stay: Payer: Medicare Other

## 2018-04-12 ENCOUNTER — Inpatient Hospital Stay: Payer: Medicare Other | Attending: Nurse Practitioner | Admitting: Hematology

## 2018-04-12 VITALS — BP 123/73 | HR 80 | Temp 98.5°F | Resp 18 | Ht 61.0 in | Wt 186.2 lb

## 2018-04-12 DIAGNOSIS — Z170 Estrogen receptor positive status [ER+]: Secondary | ICD-10-CM

## 2018-04-12 DIAGNOSIS — C50511 Malignant neoplasm of lower-outer quadrant of right female breast: Secondary | ICD-10-CM | POA: Diagnosis not present

## 2018-04-12 DIAGNOSIS — F419 Anxiety disorder, unspecified: Secondary | ICD-10-CM | POA: Insufficient documentation

## 2018-04-12 DIAGNOSIS — F329 Major depressive disorder, single episode, unspecified: Secondary | ICD-10-CM | POA: Diagnosis not present

## 2018-04-12 LAB — BASIC METABOLIC PANEL - CANCER CENTER ONLY
ANION GAP: 8 (ref 3–11)
BUN: 12 mg/dL (ref 7–26)
CALCIUM: 10 mg/dL (ref 8.4–10.4)
CO2: 27 mmol/L (ref 22–29)
Chloride: 106 mmol/L (ref 98–109)
Creatinine: 0.96 mg/dL (ref 0.60–1.10)
GFR, Estimated: 60 mL/min — ABNORMAL LOW (ref >60)
Glucose, Bld: 95 mg/dL (ref 70–140)
Potassium: 4.7 mmol/L (ref 3.5–5.1)
Sodium: 141 mmol/L (ref 136–145)

## 2018-04-12 NOTE — Telephone Encounter (Signed)
Spoke with patient's son about bringing patient in at 11:00 today instead of next Tuesday to see Dr. Burr Medico, the patient and son are in agreement to come today at 35

## 2018-04-12 NOTE — Telephone Encounter (Signed)
-----   Message from Truitt Merle, MD sent at 04/12/2018  7:54 AM EDT ----- Malachy Mood, could you call her and move her appointment to today or tomorrow? Thanks   Krista Blue  ----- Message ----- From: Stark Klein, MD Sent: 04/11/2018   4:40 PM To: Truitt Merle, MD  I may need to do her surgery 6/25, so if so, can we move your appt with her?  I should know by Friday. fb

## 2018-04-12 NOTE — Progress Notes (Signed)
[facility]  [phone]) [phone] Fax:([phone]) [phone]  Clinic Follow Up Note   Patient Care Team: Lin Landsman, MD as PCP - General (Family Medicine) Alda Berthold, DO as Consulting Physician (Neurology) Truitt Merle, MD as Consulting Physician (Hematology) Stark Klein, MD as Consulting Physician (General Surgery) Eppie Gibson, MD as Attending Physician (Radiation Oncology)   Date of Service:  04/12/2018  REFERRAL PHYSICIAN: DR. BYERLY   CHIEF COMPLAINTS/PURPOSE OF CONSULTATION:  Follow up for right breast cancer    Oncology History   Cancer Staging Malignant neoplasm of 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, estrogen receptor positive ([place]) Staging form: Breast, AJCC 8th Edition - Clinical stage from 02/16/2018: Stage IB (cT2(m), cN0, cM0, G2, ER+, PR+, HER2-) - Signed by Truitt Merle, MD on 03/11/2018      Malignant neoplasm of 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, estrogen receptor positive ([place])   01/30/2018 Mammogram    Screening mammogram FINDINGS: In the right breast, possible distortion warrants further evaluation. The patient has had an excisional biopsy of her right breast upper outer quadrant in 2007, however this distortion is either better seen or new from her prior mammogram, and it does not quite match the surgical site as marked by Roman skin scar marker. In the left breast, no findings suspicious for malignancy.      02/14/2018 Mammogram    Diagnostic mammogram: In the lateral aspect of the right breast, posterior depth there is Roman prominent area of distortion at the approximate 9 o'clock location. While this is in the region of the excisional biopsy that the patient had in 2007, the distortion has become significantly more prominent as compared to the tomosynthesis mammogram performed in September of 2016, and appears to be inferior to the surgical scar.      02/14/2018 Breast US    Ultrasound of the right breast at 8:30, 4 cm from  the nipple demonstrates an irregular hypoechoic vascular mass with spiculated margins measuring 2.1 x 2.0 x 1.8 cm. There is Roman small adjacent mass at 9 o'clock, 4 cm from the nipple, which is 1.2 cm away from the dominant mass. The smaller mass measures 1.1 x 0.7 x 0.7 cm. Together, the 2 masses span 3.6 cm of tissue. Ultrasound of the right axilla demonstrates multiple normal-appearing lymph nodes.  IMPRESSION: 1. There is Roman highly suspicious mass in the right breast at 830 with Roman small possible satellite lesion at 9 o'clock. 2.  No evidence of right axillary lymphadenopathy.      02/16/2018 Cancer Staging    Staging form: Breast, AJCC 8th Edition - Clinical stage from 02/16/2018: Stage IB (cT2(m), cN0, cM0, G2, ER+, PR+, HER2-) - Signed by Truitt Merle, MD on 03/11/2018      02/16/2018 Initial Biopsy    Diagnosis 1. Breast, right, needle core biopsy, 8:30 o'clock - INVASIVE DUCTAL CARCINOMA, SEE COMMENT. 2. Breast, right, needle core biopsy, 9 o'clock - INVASIVE DUCTAL CARCINOMA. - LOBULAR NEOPLASIA (ATYPICAL LOBULAR HYPERPLASIA).  ER: 95%, positive, strong staining PR: 80%, positive, moderate staining  Proliferation Marker Ki67: 2%  HER2 - Negative Ratio of HER2/CEP17 signals: 1.80 Average HER2 copy number per cell: 2.97       03/01/2018 Imaging    MR Bilateral breast IMPRESSION: Two areas of known malignancy in the LATERAL portion of the RIGHT breast. No additional areas of concern in either breast. No axillary adenopathy.      03/11/2018 Initial Diagnosis    Malignant neoplasm of lower-outer quadrant of  right breast of female, estrogen receptor positive ([place])      04/05/2018 Surgery    RIGHT BREAST SEED BRACKETED LUMPECTOMY WITH SENTINEL LYMPH NODE BIOPSY by Dr. Barry Dienes 04/05/18      04/05/2018 Pathology Results    Surgery  Diagnosis 04/05/18  1. Breast, lumpectomy, right - INVASIVE DUCTAL CARCINOMA GRADE I/III, TWO FOCI SPANNING 2.9 CM AND 0.9 CM. - LOBULAR NEOPLASIA  (LOBULAR CARCINOMA IN SITU). - DUCTAL CARCINOMA IN SITU, LOW GRADE. - LYMPHOVASCULAR INVASION IS IDENTIFIED. - THE SURGICAL RESECTION MARGINS ARE NEGATIVE FOR DUCTAL CARCINOMA. - SEE ONCOLOGY TABLE BELOW. 2. Lymph node, sentinel, biopsy, right - THERE IS NO EVIDENCE OF CARCINOMA IN 1 OF 1 LYMPH NODE (0/1). 3. Lymph node, sentinel, biopsy, right - METASTATIC CARCINOMA IN 1 OF 1 LYMPH NODE (1/1) WITH EXTRACAPSULAR EXTENSION. - EXTRACAPSULAR EXTENSION EXTENDS 0.3 CM. 4. Lymph node, sentinel, biopsy, right - DUCTAL CARCINOMA. - PERINEURAL INVASION IS IDENTIFIED. - SEE COMMENT. 5. Lymph node, sentinel, biopsy, right 1 of 4        HISTORY OF PRESENTING ILLNESS:  Amy Roman 67 y.o. female is here because of newly diagnosed right breast cancer. She was referred by her surgeon, Dr. Barry Dienes for further evaluation. She is accompanied by an interpretor today. She denies feeling Roman lump prior to the mammogram. She denies prior breast cancer, however she notes that she had Roman biopsy in 2007 that returned negative.  She had Roman screening mammogram on 01/30/2018 that showed: Breast density category C. Further evaluation is suggested for possible distortion in the right breast.  She then proceeded to Roman diagnostic mammogram and right ultrasonography on 02/14/2018 with results showing: There is Roman highly suspicious mass in the right breast at 8:30 with Roman small possible satellite lesion at 9 o'clock. No evidence of right axillary lymphadenopathy.  She also had Roman MR Bilateral breast on 03/01/2018 showing: Two areas of known malignancy in the LATERAL portion of the RIGHT breast. No additional areas of concern in either breast. No axillary adenopathy.  She had Roman right needle core biopsy on 02/16/2018 with results showing: Breast, right, needle core biopsy, 8:30 o'clock with invasive ductal carcinoma. Breast, right, needle core biopsy, 9 o'clock with invasive ductal carcinoma. Lobular neoplasia (atypical  lobular hyperplasia). Prognostic indicators significant for: ER, 95% positive with strong staining intensity and PR, 80% positive, with moderate staining intensity. Proliferation marker Ki67 at 2%. HER2 negative.  She also had Roman hysterectomy with BSO due to vaginal bleeding intermittently occurring since October 2018 completed on 03/06/2018 with pathology showing: Uterus, ovaries and fallopian tubes, cervix: Cervix with nabothian cysts and squamous metaplasia. No dysplasia or malignancy. Endometrium with inactive endometrium. No hyperplasia or malignancy. Myometrium unremarkable with no evidence of malignancy. Uterine serosa unremarkable with no endometriosis or malignancy. Right and left ovaries unremarkable with no endometriosis or malignancy. Right and left fallopian tubes with benign paratubal cyst. No endometriosis or malignancy.   On review of systems, she reports lower abdominal pain and lower back pain following her recent hysterotomy and BSO. she denies back pain and any other symptoms. Pertinent positives are listed and detailed within the above HPI.  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 She denies Roman hx of DM or any other hx of cancer. She has Roman FHx of her maternal aunt with leukemia and Roman cousin with  Another type of cancer with lumps to her neck, however she is unsure of the name of  the cancer. She denies Roman family hx of breast cancer or ovarian cancer. She doesn't smoke cigarettes or consume ETOH and she notes that she is retired at this time. 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 She has 4 sons, 3 of which live with her and the other lives out of the country with her youngest son being age 76 and eldest age 24 and she has 5 grandchildren.   GYN history:  Menarche: 91-8 years old Age at first live birth: 67 years old GP: GxP4 LMP: in her 74's  Contraceptive: N/Roman HRT: no   The patient is accompanied by Roman medical interpretor today, who  the patient has elected to translate for her during this visit.     INTERVAL HISTORY    Kimiyo Roman Roman is her for Roman follow up post right breast lumpectomy and to discussed her pathology. She presents to the clinic today accompanied by her brother and electronic interpreter [phone]).  She notes her surgery went well and has not reviewed her pathology with Dr. Barry Dienes. She will follow up with Dr. Barry Dienes on Monday, 6/24. She wonders about her quality of life after re-excision surgery.  She requests Roman medical note explaining her medical diagnosis and treatment to allow her son, Cheryll Dessert, to come visit her from [place].       MEDICAL HISTORY:  Past Medical History:  Diagnosis Date  . Anxiety and depression 10/15/2014  . Arthritis    BILATERAL KNEES, HAD CORTISONE INJECTION 03/2017  . Brain cancer ([place])   . Depression   . GERD (gastroesophageal reflux disease)    OCCASIONAL WITH CERTAIN FOOD  . Headache   . Seasonal allergies   . SVD (spontaneous vaginal delivery)    x 4  . Unspecified hereditary and idiopathic peripheral neuropathy 05/14/2014   LEFT KNEE AND FOOT    SURGICAL HISTORY: Past Surgical History:  Procedure Laterality Date  . BREAST EXCISIONAL BIOPSY Right   . BREAST LUMPECTOMY  2007   RIGHT BREAST  . BREAST LUMPECTOMY WITH RADIOACTIVE SEED AND SENTINEL LYMPH NODE BIOPSY Right 04/05/2018   Procedure: RIGHT BREAST SEED BRACKETED LUMPECTOMY WITH SENTINEL LYMPH NODE BIOPSY;  Surgeon: Stark Klein, MD;  Location: [facility];  Service: General;  Laterality: Right;  . BREAST SURGERY     bx, no cancer   . CATARACT EXTRACTION Bilateral   . DILATATION & CURETTAGE/HYSTEROSCOPY WITH MYOSURE N/Roman 08/09/2017   Procedure: DILATATION & CURETTAGE/HYSTEROSCOPY WITH MYOSURE;  Surgeon: Emily Filbert, MD;  Location: [facility];  Service: Gynecology;  Laterality: N/Roman;  . LABIOPLASTY  03/06/2018   Procedure: LABIAPLASTY;  Surgeon: Emily Filbert, MD;  Location: [facility];  Service: Gynecology;;  . VAGINAL HYSTERECTOMY Bilateral 03/06/2018   Procedure: HYSTERECTOMY VAGINAL WITH BILATERAL SALPINGOOPHORECTOMY;  Surgeon: Emily Filbert, MD;  Location: [facility];  Service: Gynecology;  Laterality: Bilateral;    SOCIAL HISTORY: Social History   Socioeconomic History  . Marital status: Married    Spouse name: Not on file  . Number of children: 4  . Years of education: Not on file  . Highest education level: Not on file  Occupational History  . Occupation: house   Social Needs  . Financial resource strain: Not on file  . Food insecurity:    Worry: Not on file    Inability: Not on file  . Transportation needs:    Medical: Not on file    Non-medical: Not on file  Tobacco Use  .  Smoking status: Never Smoker  . Smokeless tobacco: Never Used  Substance and Sexual Activity  . Alcohol use: No  . Drug use: No  . Sexual activity: Not on file  Lifestyle  . Physical activity:    Days per week: Not on file    Minutes per session: Not on file  . Stress: Not on file  Relationships  . Social connections:    Talks on phone: Not on file    Gets together: Not on file    Attends religious service: Not on file    Active member of club or organization: Not on file    Attends meetings of clubs or organizations: Not on file    Relationship status: Not on file  . Intimate partner violence:    Fear of current or ex partner: Not on file    Emotionally abused: Not on file    Physically abused: Not on file    Forced sexual activity: Not on file  Other Topics Concern  . Not on file  Social History Narrative   Original from [place]   Moved to the [place] 2000   Widow    She lives with son.  She had four sons.          FAMILY HISTORY: Family History  Problem Relation Age of Onset  . Diabetes Mother   . Neuropathy Mother   . Neuropathy Sister   . Cancer Sister        unknown type cancer in neck   . Other Father   . Cancer Maternal Aunt        leukemia     . Colon cancer Neg Hx   . Breast cancer Neg Hx     ALLERGIES:  has No Known Allergies.  MEDICATIONS:  Current Outpatient Medications  Medication Sig Dispense Refill  . b complex vitamins tablet Take 1 tablet by mouth daily.    . famotidine (PEPCID) 10 MG tablet Take 10 mg by mouth 2 (two) times daily as needed for heartburn.     Marland Kitchen FLUoxetine (PROZAC) 40 MG capsule Take 40 mg by mouth daily.    Marland Kitchen gabapentin (NEURONTIN) 800 MG tablet Take 200 mg by mouth 4 (four) times daily as needed.     Marland Kitchen oxyCODONE (OXY IR/ROXICODONE) 5 MG immediate release tablet Take 1 tablet (5 mg total) by mouth every 4 (four) hours as needed for severe pain. 15 tablet 0  . traZODone (DESYREL) 100 MG tablet Take 100 mg by mouth at bedtime.     No current facility-administered medications for this visit.     REVIEW OF SYSTEMS:   Constitutional: Denies fevers, chills or abnormal night sweats Eyes: Denies blurriness of vision, double vision or watery eyes Ears, nose, mouth, throat, and face: Denies mucositis or sore throat Respiratory: Denies cough, dyspnea or wheezes Cardiovascular: Denies palpitation, chest discomfort or lower extremity swelling Gastrointestinal:  Denies nausea, heartburn or change in bowel habits Skin: Denies abnormal skin rashes Lymphatics: Denies new lymphadenopathy or easy bruising Neurological:Denies numbness, tingling or new weaknesses Behavioral/Psych: Mood is stable, no new changes  All other systems were reviewed with the patient and are negative.  PHYSICAL EXAMINATION:  ECOG PERFORMANCE STATUS: 1 - Symptomatic but completely ambulatory  Vitals:   04/12/18 1126  BP: 123/73  Pulse: 80  Resp: 18  Temp: 98.5 F (36.9 C)  SpO2: 99%   Filed Weights   04/12/18 1126  Weight: 186 lb 3.2 oz (84.5 kg)   GENERAL:alert, no  distress and comfortable SKIN: skin color, texture, turgor are normal, no rashes or significant lesions EYES: normal, conjunctiva are pink and non-injected,  sclera clear OROPHARYNX:no exudate, no erythema and lips, buccal mucosa, and tongue normal  NECK: supple, thyroid normal size, non-tender, without nodularity LYMPH:  no palpable lymphadenopathy in the cervical, axillary or inguinal LUNGS: clear to auscultation and percussion with normal breathing effort HEART: regular rate & rhythm and no murmurs and no lower extremity edema ABDOMEN:abdomen soft, non-tender and normal bowel sounds Musculoskeletal:no cyanosis of digits and no clubbing  PSYCH: alert & oriented x 3 with fluent speech NEURO: no focal motor/sensory deficits Breast: (+) s/p right breast lumpectomy: her 2 surgical incisions healing well, no discharge or skin erythema.  LABORATORY DATA:  I have reviewed the data as listed CBC Latest Ref Rng & Units 03/07/2018 02/28/2018 08/08/2017  WBC 4.0 - 10.5 K/uL 6.3 4.1 5.0  Hemoglobin 12.0 - 15.0 g/dL 10.3(L) 12.0 12.8  Hematocrit 36.0 - 46.0 % 30.9(L) 36.5 38.8  Platelets 150 - 400 K/uL 156 220 235    CMP Latest Ref Rng & Units 02/28/2018 05/01/2017 05/14/2014  Glucose 65 - 99 mg/dL 107(H) 102(H) 128(H)  BUN 6 - 20 mg/dL 17 26(H) 16  Creatinine 0.44 - 1.00 mg/dL 0.80 0.95 0.8  Sodium 135 - 145 mmol/L 136 139 137  Potassium 3.5 - 5.1 mmol/L 4.3 4.1 3.8  Chloride 101 - 111 mmol/L 102 102 107  CO2 22 - 32 mmol/L _0 Calcium 8.9 - 10.3 mg/dL 9.4 9.8 9.2  Total Protein 6.5 - 8.1 g/dL - 7.6 7.0  Total Bilirubin 0.3 - 1.2 mg/dL - 0.4 0.5  Alkaline Phos 38 - 126 U/L - 77 86  AST 15 - 41 U/L - 32 24  ALT 14 - 54 U/L - 29 18    PATHOLOGY:  Surgery  Diagnosis 04/05/18  1. Breast, lumpectomy, right - INVASIVE DUCTAL CARCINOMA GRADE I/III, TWO FOCI SPANNING 2.9 CM AND 0.9 CM. - LOBULAR NEOPLASIA (LOBULAR CARCINOMA IN SITU). - DUCTAL CARCINOMA IN SITU, LOW GRADE. - LYMPHOVASCULAR INVASION IS IDENTIFIED. - THE SURGICAL RESECTION MARGINS ARE NEGATIVE FOR DUCTAL CARCINOMA. - SEE ONCOLOGY TABLE BELOW. 2. Lymph node, sentinel, biopsy,  right - THERE IS NO EVIDENCE OF CARCINOMA IN 1 OF 1 LYMPH NODE (0/1). 3. Lymph node, sentinel, biopsy, right - METASTATIC CARCINOMA IN 1 OF 1 LYMPH NODE (1/1) WITH EXTRACAPSULAR EXTENSION. - EXTRACAPSULAR EXTENSION EXTENDS 0.3 CM. 4. Lymph node, sentinel, biopsy, right - DUCTAL CARCINOMA. - PERINEURAL INVASION IS IDENTIFIED. - SEE COMMENT. 5. Lymph node, sentinel, biopsy, right 1 of 4 Amended copy Corrected FINAL for Roman, Amy Roman ([id number]) Diagnosis(continued) - METASTATIC CARCINOMA IN 1 OF 1 LYMPH NODE (1/1), WITH EXTRACAPSULAR EXTENSION. - EXTRACAPSULAR EXTENSION EXTENDS 0.7 CM. 6. Lymph node, sentinel, biopsy, right - METASTATIC CARCINOMA IN 1 OF 1 LYMPH NODE (1/1), WITH EXTRACAPSULAR EXTENSION. - EXTRACAPSULAR EXTENSION EXTENDS 0.2 CM. Microscopic Comment 1. INVASIVE CARCINOMA OF THE BREAST: Resection Procedure: Lumpectomy. Specimen Laterality: Right. Tumor Size: 2.9 cm and 0.9 cm (gross measurements). Histologic Type: Ductal. Histologic Grade: I. Glandular (Acinar)/Tubular Differentiation: 3. Nuclear Pleomorphism: 1. Mitotic Rate: 1. Overall Grade: I/III. Ductal Carcinoma In Situ: Present, low grade. Tumor Extension: Confined to breast parenchyma. Margins: Distance from closest margin (millimeters): Greater than 0.2 cm to all margins. DCIS Margins: Distance from closest margin (millimeters): Greater than 0.2 cm to all margins. Regional Lymph Nodes: Number of Lymph Nodes Examined: 4. Number of Sentinel Nodes Examined (if applicable): 4.  Number of Lymph Nodes with Macrometastases (>2 mm): 3. Number of Lymph Nodes with Micrometastases: 0. Number of Lymph Nodes with Isolated Tumor Cells (?0.2 mm or ?200 cells)#: 0. Size of Largest Metastatic Deposit (millimeters): 9 mm. Extranodal Extension: Present, extensive. Treatment Effect: No known presurgical therapy. Breast Biomarker Testing Performed on Previous Biopsy: Yes. Testing Performed on Case Number:  [id number]. Estrogen Receptor: 95%, strong. Progesterone Receptor: 80%, strong. HER2: No amplification was detected. The ratio was 1.80. Ki-67: 2%. Representative tumor block: 1D. Pathologic Stage Classification (pTNM, AJCC 8th Edition): mpT2, pN1c. (v4.2.0.0) 4. Lymph nodal tissue is not definitively identified in part 4.    Diagnosis, 03/06/2018 Uterus, ovaries and fallopian tubes, cervix CERVIX: - NABOTHIAN CYSTS AND SQUAMOUS METAPLASIA. - NO DYSPLASIA OR MALIGNANCY. ENDOMETRIUM: - INACTIVE ENDOMETRIUM. - NO HYPERPLASIA OR MALIGNANCY. - SEE MICROSCOPIC DESCRIPTION. MYOMETRIUM: - UNREMARKABLE. - NO EVIDENCE OF MALIGNANCY. UTERINE SEROSA: - UNREMARKABLE. - NO ENDOMETRIOSIS OR MALIGNANCY. RIGHT AND LEFT OVARIES: - UNREMARKABLE. - NO ENDOMETRIOSIS OR MALIGNANCY. RIGHT AND LEFT FALLOPIAN TUBES: - BENIGN PARATUBAL CYST. - NO ENDOMETRIOSIS OR MALIGNANCY.  Diagnosis, 02/16/2018 1. Breast, right, needle core biopsy, 8:30 o'clock - INVASIVE DUCTAL CARCINOMA, SEE COMMENT. 2. Breast, right, needle core biopsy, 9 o'clock - INVASIVE DUCTAL CARCINOMA. - LOBULAR NEOPLASIA (ATYPICAL LOBULAR HYPERPLASIA).  RADIOGRAPHIC STUDIES: I have personally reviewed the radiological images as listed and agreed with the findings in the report. Nm Sentinel Node Inj-no Rpt (breast)  Result Date: 04/05/2018 Sulfur colloid was injected by the nuclear medicine technologist for melanoma sentinel node.   Mm Breast Surgical Specimen  Result Date: 04/05/2018 CLINICAL DATA:  Patient with recently diagnosed cancer at 2 nearby sites within the RIGHT breast, status post lumpectomy today after earlier radioactive seed localizations. EXAM: SPECIMEN RADIOGRAPH OF THE RIGHT BREAST COMPARISON:  Previous exam(s). FINDINGS: Status post excision of the right breast. The 2 radioactive seeds and 2 biopsy marker clips are present, completely intact, and were marked for pathology. The positions of the radioactive  seeds and biopsy marker clips within the specimen were discussed with the OR staff during the procedure. IMPRESSION: Specimen radiograph of the right breast. Electronically Signed   By: Franki Cabot M.D.   On: 04/05/2018 12:27   Mm Rt Radioactive Seed Loc Mammo Guide  Result Date: 04/03/2018 CLINICAL DATA:  Preoperative radioactive seed localization, prior to right breast lumpectomy for biopsy-proven malignancies at 8:30 o'clock and 9 o'clock. EXAM: MAMMOGRAPHIC GUIDED RADIOACTIVE SEED LOCALIZATION OF THE RIGHT BREAST COMPARISON:  Previous exam(s). FINDINGS: Patient presents for radioactive seed localization prior to right breast lumpectomy. I met with the patient and we discussed the procedure of seed localization including benefits and alternatives. We discussed the high likelihood of Roman successful procedure. We discussed the risks of the procedure including infection, bleeding, tissue injury and further surgery. We discussed the low dose of radioactivity involved in the procedure. Informed, written consent was given. The usual time-out protocol was performed immediately prior to the procedure. Using mammographic guidance, sterile technique, 1% lidocaine and an I-125 radioactive seed, right breast 8:30 o'clock mass, associated with ribbon shaped marker was localized using Roman lateral approach. The follow-up mammogram images demonstrate the seed to be located 5 mm posterior and superior to the post biopsy marker. Follow-up survey of the patient confirms presence of the radioactive seed. Order number of I-125 seed:  [id number]. Total activity:  4.270 millicurie reference Date: March 29, 2018 Next, using mammographic guidance, sterile technique, 1% lidocaine and an I-125 radioactive seed, right breast 9 o'clock  mass, associated with coil shaped marker was localized using Roman lateral approach. The follow-up mammogram images confirm the seed in the expected location and were marked for Dr. Barry Dienes. Follow-up survey of the  patient confirms presence of the radioactive seed. Order number of I-125 seed:  [id number]. Total activity:  5.993 millicurie reference Date: Mar 13, 2018 The patient tolerated the procedure well and was released from the [facility]. She was given instructions regarding seed removal. IMPRESSION: Radioactive seed localization of 2 right breast masses. No apparent complications. Radioactive seed at the 8:30 o'clock position is located 5 mm posterior and superior to the ribbon shaped post biopsy marker/center of the mass. Radioactive seed at the 9 o'clock position is located immediately adjacent to the coil shaped post biopsy marker/mass. These results were called by telephone at the time of interpretation on 04/03/2018 at 2:16 pm to Dr. Stark Klein , who verbally acknowledged these results. Electronically Signed   By: Fidela Salisbury M.D.   On: 04/03/2018 14:16   Mm Rt Radio Seed Ea Add Lesion Loc Mammo  Result Date: 04/03/2018 CLINICAL DATA:  Preoperative radioactive seed localization, prior to right breast lumpectomy for biopsy-proven malignancies at 8:30 o'clock and 9 o'clock. EXAM: MAMMOGRAPHIC GUIDED RADIOACTIVE SEED LOCALIZATION OF THE RIGHT BREAST COMPARISON:  Previous exam(s). FINDINGS: Patient presents for radioactive seed localization prior to right breast lumpectomy. I met with the patient and we discussed the procedure of seed localization including benefits and alternatives. We discussed the high likelihood of Roman successful procedure. We discussed the risks of the procedure including infection, bleeding, tissue injury and further surgery. We discussed the low dose of radioactivity involved in the procedure. Informed, written consent was given. The usual time-out protocol was performed immediately prior to the procedure. Using mammographic guidance, sterile technique, 1% lidocaine and an I-125 radioactive seed, right breast 8:30 o'clock mass, associated with ribbon shaped marker was localized  using Roman lateral approach. The follow-up mammogram images demonstrate the seed to be located 5 mm posterior and superior to the post biopsy marker. Follow-up survey of the patient confirms presence of the radioactive seed. Order number of I-125 seed:  [id number]. Total activity:  0.300 millicurie reference Date: March 29, 2018 Next, using mammographic guidance, sterile technique, 1% lidocaine and an I-125 radioactive seed, right breast 9 o'clock mass, associated with coil shaped marker was localized using Roman lateral approach. The follow-up mammogram images confirm the seed in the expected location and were marked for Dr. Barry Dienes. Follow-up survey of the patient confirms presence of the radioactive seed. Order number of I-125 seed:  [id number]. Total activity:  2.633 [place] reference Date: Mar 13, 2018 The patient tolerated the procedure well and was released from the [facility]. She was given instructions regarding seed removal. IMPRESSION: Radioactive seed localization of 2 right breast masses. No apparent complications. Radioactive seed at the 8:30 o'clock position is located 5 mm posterior and superior to the ribbon shaped post biopsy marker/center of the mass. Radioactive seed at the 9 o'clock position is located immediately adjacent to the coil shaped post biopsy marker/mass. These results were called by telephone at the time of interpretation on 04/03/2018 at 2:16 pm to Dr. Stark Klein , who verbally acknowledged these results. Electronically Signed   By: Fidela Salisbury M.D.   On: 04/03/2018 14:16    ASSESSMENT & PLAN:  Amy Roman is Roman 67 y.o. African female with Roman history of Arthritis and neuropathy  1.  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 Breast, right, needle core biopsy, 9 o'clock with invasive ductal carcinoma. Lobular neoplasia (atypical lobular hyperplasia). Prognostic indicators significant for: ER, 95% positive with strong staining intensity and PR, 80% positive, with moderate staining intensity. Proliferation marker Ki67 at 2%. HER2 negative. -Due to the size of her 2 tumors, mastectomy was recommended, however patient strongly preferred lumpectomy..   -She underwent right breast lumpectomy by Dr. Barry Dienes on 04/05/18.  -I discussed her pathology results which showed her known 2 breast tumors with negative margins, 3/4 positive lymph nodes, and 1 soft tissue deposit of cancer in the axilla.  -I discussed given her multiple positive lymph nodes, she would need axillary lymph node dissection to remove all lymph nodes, no further surgery of her breast is needed. She will discussed this further with Dr. Barry Dienes on 6/24. I discussed the risk of lymphedema from ALND.  -If more lymph nodes are found to be positive, I would recommend adjuvant chemotherapy at that time given her high risk of cancer recurrence. If no more positive lymph nodes, I would proceed with mammaprint for further risk stratification and guide adjuvant chemotherapy. -I discussed if she is to proceed with chemotherapy she may experience fatigue, nausea, vomiting, diarrhea, hair loss, low blood counts which may require blood transfusion, peripheral neuropathy, and CHF based on her chemo treatment. I may recommend AC-T regimen if chemo is necessary.  -I discussed her cancer is still likely curable at this point.  -I recommend CT CAP and bone scan to rule out any distant metastasis. She is agreeable.  -Given her positive lymph nodes she would proceed with radiation therapy with Dr. Isidore Moos to reduce risk of local recurrence.  -Giving the strong ER and PR expression in her postmenopausal status, I recommend adjuvant endocrine therapy with aromatase  inhibitor for Roman total of 5-10 years to reduce the risk of distant cancer recurrence. Potential benefits and side effects were discussed with patient and she is interested. -F/u after ALND surgery  2. Anxiety, depression -Patient does take anti-depressants  -She reports that she doesn't want her family to know of her diagnosis  Plan:  -I provided Roman letter explaining her diagnosis and treatment to pt today, hopefully this will help her son to get the [place] visa to visit her -Proceed with R ALND surgery next week, will determine if she needs mammaprint test after surgery -CT CAP with contrast and bone scan tomorrow or 6/24 -lab today.  -I discussed the above with Dr. Barry Dienes.   All questions were answered. The patient knows to call the clinic with any problems, questions or concerns. I spent 30 minutes counseling the patient face to face. The total time spent in the appointment was 40 minutes and more than 50% was on counseling.  Oneal Deputy, am acting as scribe for Truitt Merle, MD.   I have reviewed the above documentation for accuracy and completeness, and I agree with the above.     Truitt Merle, MD 04/12/2018 12:31 PM

## 2018-04-12 NOTE — Progress Notes (Signed)
[employer] video interpreter (Arabic) used during Dr. Ernestina Penna discussion with patient and her son

## 2018-04-12 NOTE — Telephone Encounter (Signed)
Call central scheduling to request whole body scan be done tomorrow 04/13/18 per Dr. Burr Medico.  Was given an appointment for tomorrow patient to arrive at 9:30 at [facility], called patient's on and gave information, he verbalized an understanding.

## 2018-04-12 NOTE — Telephone Encounter (Signed)
Added patient for labs today. Per 6/20 los. Printed avs and calender of upcoming appointment

## 2018-04-12 NOTE — Telephone Encounter (Signed)
Printed avs and calender of upcoming appointment. Per 6/20 los gave contrast, and instruction

## 2018-04-13 ENCOUNTER — Encounter (HOSPITAL_BASED_OUTPATIENT_CLINIC_OR_DEPARTMENT_OTHER): Payer: Self-pay | Admitting: *Deleted

## 2018-04-13 ENCOUNTER — Other Ambulatory Visit: Payer: Self-pay

## 2018-04-13 ENCOUNTER — Encounter (HOSPITAL_COMMUNITY)
Admission: RE | Admit: 2018-04-13 | Discharge: 2018-04-13 | Disposition: A | Discharge status: Home / Self Care | Payer: Medicare Other | Source: Ambulatory Visit | Attending: Hematology | Admitting: Hematology

## 2018-04-13 ENCOUNTER — Encounter: Payer: Self-pay | Admitting: Hematology

## 2018-04-13 DIAGNOSIS — C50911 Malignant neoplasm of unspecified site of right female breast: Secondary | ICD-10-CM | POA: Diagnosis not present

## 2018-04-13 DIAGNOSIS — C50511 Malignant neoplasm of lower-outer quadrant of right female breast: Secondary | ICD-10-CM | POA: Insufficient documentation

## 2018-04-13 DIAGNOSIS — Z170 Estrogen receptor positive status [ER+]: Secondary | ICD-10-CM | POA: Diagnosis not present

## 2018-04-13 MED ORDER — TECHNETIUM TC 99M MEDRONATE IV KIT
21.2000 | PACK | Freq: Once | INTRAVENOUS | Status: AC | PRN
  Administered 2018-04-13: 21.2 via INTRAVENOUS

## 2018-04-16 ENCOUNTER — Encounter (HOSPITAL_COMMUNITY): Payer: Self-pay

## 2018-04-16 ENCOUNTER — Ambulatory Visit (HOSPITAL_COMMUNITY)
Admission: RE | Admit: 2018-04-16 | Discharge: 2018-04-16 | Disposition: A | Discharge status: Home / Self Care | Payer: Medicare Other | Source: Ambulatory Visit | Attending: Hematology | Admitting: Hematology

## 2018-04-16 DIAGNOSIS — C50511 Malignant neoplasm of lower-outer quadrant of right female breast: Secondary | ICD-10-CM | POA: Insufficient documentation

## 2018-04-16 DIAGNOSIS — R911 Solitary pulmonary nodule: Secondary | ICD-10-CM | POA: Diagnosis not present

## 2018-04-16 DIAGNOSIS — Z9889 Other specified postprocedural states: Secondary | ICD-10-CM | POA: Diagnosis not present

## 2018-04-16 DIAGNOSIS — Z170 Estrogen receptor positive status [ER+]: Secondary | ICD-10-CM | POA: Diagnosis not present

## 2018-04-16 DIAGNOSIS — C50911 Malignant neoplasm of unspecified site of right female breast: Secondary | ICD-10-CM | POA: Diagnosis not present

## 2018-04-16 MED ORDER — IOPAMIDOL (ISOVUE-300) INJECTION 61%
100.0000 mL | Freq: Once | INTRAVENOUS | Status: AC | PRN
  Administered 2018-04-16: 100 mL via INTRAVENOUS

## 2018-04-16 MED ORDER — IOPAMIDOL (ISOVUE-300) INJECTION 61%
INTRAVENOUS | Status: AC
  Filled 2018-04-16: qty 100

## 2018-04-16 NOTE — H&P (Signed)
Hollice Espy Documented: 04/16/2018 10:29 AM Location: [facility] Patient #: [id number] DOB: [birth year] Widowed / Language: Arabic / Race: Undefined Female   History of Present Illness Stark Klein MD; 04/16/2018 11:04 AM) The patient is a 67 year old female who presents for a follow-up for Breast cancer. Pt is a 67 yo F who was referred by Dr. Theda Sers for dx of right breast cancer 01/2018. The patient had a history of a benign breast biopsy in 2007 with ADH and CSL. The lumpectomy appearance changed on this last mammogram and she reports feeling a mass recently. She had diagnostic imaging showing a mass at 8:30 and at 9 o'clock. There was a 2.1 cm mass at 8:30 and a 1.1 cm one at 9 o'clock, 1.2 cm away and 3.6 cm in total dimension. 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 ALH was also present on specimen. breast density is D. She denies breast pain or nipple discharge. She has no personal history of cancer. She also has no family history of breast/ovarian cancer or other cancers.   Because of the two cancers, she had a pre op MRI that was negative for any additional findings. She underwent bracketed lumpectomy with sentinel lymph node biopsy 04/05/2018. Margins were negative, but 3/4 LN were positive and there was an additional soft tissue nodule that was positive. She is sore and a bit fatigued. She has some swelling in her right breast and axilla. She saw oncology and had bone scan which was negative. CTs are scheduled today.     pathology lump/SLN bx 04/05/2018   4. The carcinoma present in specimen #4, is favored to be an axillary soft tissue deposit of carcinoma. 1. Breast, lumpectomy, right - INVASIVE DUCTAL CARCINOMA GRADE I/III, TWO FOCI SPANNING 2.9 CM AND 0.9 CM. - LOBULAR NEOPLASIA (LOBULAR CARCINOMA IN SITU). - DUCTAL CARCINOMA IN SITU, LOW GRADE. - LYMPHOVASCULAR INVASION IS IDENTIFIED. - THE  SURGICAL RESECTION MARGINS ARE NEGATIVE FOR DUCTAL CARCINOMA. - SEE ONCOLOGY TABLE BELOW. 2. Lymph node, sentinel, biopsy, right - THERE IS NO EVIDENCE OF CARCINOMA IN 1 OF 1 LYMPH NODE (0/1). 3. Lymph node, sentinel, biopsy, right - METASTATIC CARCINOMA IN 1 OF 1 LYMPH NODE (1/1) WITH EXTRACAPSULAR EXTENSION. - EXTRACAPSULAR EXTENSION EXTENDS 0.3 CM. 4. Lymph node, sentinel, biopsy, right - DUCTAL CARCINOMA. - PERINEURAL INVASION IS IDENTIFIED. - SEE COMMENT. 5. Lymph node, sentinel, biopsy, right - METASTATIC CARCINOMA IN 1 OF 1 LYMPH NODE (1/1), WITH EXTRACAPSULAR EXTENSION. - EXTRACAPSULAR EXTENSION EXTENDS 0.7 CM. 6. Lymph node, sentinel, biopsy, right - METASTATIC CARCINOMA IN 1 OF 1 LYMPH NODE (1/1), WITH EXTRACAPSULAR EXTENSION. - EXTRACAPSULAR EXTENSION EXTENDS 0.2 CM. Estrogen Receptor: 95%, strong. Progesterone Receptor: 80%, strong. HER2: No amplification was detected. The ratio was 1.80. Ki-67: 2%. Representative tumor block: 1D. Pathologic Stage Classification (pTNM, AJCC 8th Edition): mpT2, pN1c.  MRI breast 03/01/18 FINDINGS: Breast composition: c. Heterogeneous fibroglandular tissue.  Background parenchymal enhancement: Marked.  Right breast: Within the LOWER OUTER QUADRANT of the RIGHT breast there is an enhancing round mass with spiculated margins measuring 2.5 x 2.4 centimeters and demonstrating washout type enhancement kinetics. Within this mass there is tissue marker clip artifact. Anterior to this mass, skin retraction. No skin enhancement or continuity of the mass with the skin.  Just superior to this mass, there is tissue marker clip artifact from a coil shaped clip, marking the known malignancy in the 9 o'clock location of the RIGHT  breast. There is no parenchymal enhancement in this location, but enhancement in a small mass could be obscured by the coil shaped clip.  Left breast: No mass or abnormal enhancement.  Lymph nodes: No abnormal appearing  lymph nodes.  Ancillary findings: None.  IMPRESSION: Two areas of known malignancy in the LATERAL portion of the RIGHT breast.  No additional areas of concern in either breast.  No axillary adenopathy.  RECOMMENDATION: Treatment plan for known RIGHT breast cancer.  BI-RADS CATEGORY 6: Known biopsy-proven malignancy.   Allergies Sabino Gasser; 04/16/2018 10:29 AM) No Known Drug Allergies [02/23/2018]: Allergies Reconciled   Medication History Sabino Gasser; 04/16/2018 10:29 AM) Tylenol (325MG Tablet, Oral) Active. B Complex (Oral) Active. Pepcid (20MG Tablet, Oral) Active. Gabapentin (800MG Tablet, Oral) Active. Ibuprofen (600MG Tablet, Oral) Active. Temazepam (30MG Capsule, Oral) Active. Medications Reconciled    Review of Systems Stark Klein MD; 04/16/2018 10:42 AM) All other systems negative  Vitals Sabino Gasser; 04/16/2018 10:30 AM) 04/16/2018 10:29 AM Weight: 187.13 lb Height: 59in Body Surface Area: 1.79 m Body Mass Index: 37.79 kg/m  Temp.: 98.37F(Oral)  Pulse: 105 (Regular)  BP: 134/74 (Sitting, Left Arm, Standard)       Physical Exam Stark Klein MD; 04/16/2018 11:05 AM) Breast Note: large seroma right axilla. some right breast lymphedema.     Assessment & Plan Stark Klein MD; 04/16/2018 11:05 AM) PRIMARY CANCER OF LOWER OUTER QUADRANT OF RIGHT FEMALE BREAST (C50.511) Impression: As long as metastatic workup is negative, will proceed wtih ALND tomorrow. If additional nodes are positive, she will get chemo. If no additional nodes are positive, mammaprint will be sent.  Discussed surgery and risks/benefits.  Surgery already set up. Current Plans Instructed to keep follow-up appointment as scheduled   Signed by Stark Klein, MD (04/16/2018 11:05 AM)

## 2018-04-16 NOTE — Progress Notes (Signed)
Ensure given to pt's son, instructed to drink by 0530 day of surgery with teach back method.

## 2018-04-17 ENCOUNTER — Encounter (HOSPITAL_BASED_OUTPATIENT_CLINIC_OR_DEPARTMENT_OTHER)
Admission: RE | Disposition: A | Discharge status: Home / Self Care | Payer: Self-pay | Source: Ambulatory Visit | Attending: General Surgery

## 2018-04-17 ENCOUNTER — Ambulatory Visit (HOSPITAL_BASED_OUTPATIENT_CLINIC_OR_DEPARTMENT_OTHER): Payer: Medicare Other | Admitting: Anesthesiology

## 2018-04-17 ENCOUNTER — Ambulatory Visit (HOSPITAL_COMMUNITY)
Admission: RE | Admit: 2018-04-17 | Discharge: 2018-04-18 | Disposition: A | Discharge status: Home / Self Care | Payer: Medicare Other | Source: Ambulatory Visit | Attending: General Surgery | Admitting: General Surgery

## 2018-04-17 ENCOUNTER — Other Ambulatory Visit: Payer: Self-pay

## 2018-04-17 ENCOUNTER — Encounter (HOSPITAL_BASED_OUTPATIENT_CLINIC_OR_DEPARTMENT_OTHER): Payer: Self-pay | Admitting: Certified Registered"

## 2018-04-17 ENCOUNTER — Ambulatory Visit: Payer: Self-pay | Admitting: Hematology

## 2018-04-17 DIAGNOSIS — M1990 Unspecified osteoarthritis, unspecified site: Secondary | ICD-10-CM | POA: Insufficient documentation

## 2018-04-17 DIAGNOSIS — F419 Anxiety disorder, unspecified: Secondary | ICD-10-CM | POA: Diagnosis not present

## 2018-04-17 DIAGNOSIS — Z85841 Personal history of malignant neoplasm of brain: Secondary | ICD-10-CM | POA: Insufficient documentation

## 2018-04-17 DIAGNOSIS — K219 Gastro-esophageal reflux disease without esophagitis: Secondary | ICD-10-CM | POA: Insufficient documentation

## 2018-04-17 DIAGNOSIS — N641 Fat necrosis of breast: Secondary | ICD-10-CM | POA: Diagnosis not present

## 2018-04-17 DIAGNOSIS — Z6834 Body mass index (BMI) 34.0-34.9, adult: Secondary | ICD-10-CM | POA: Insufficient documentation

## 2018-04-17 DIAGNOSIS — Z79899 Other long term (current) drug therapy: Secondary | ICD-10-CM | POA: Insufficient documentation

## 2018-04-17 DIAGNOSIS — C50511 Malignant neoplasm of lower-outer quadrant of right female breast: Secondary | ICD-10-CM | POA: Diagnosis not present

## 2018-04-17 DIAGNOSIS — E538 Deficiency of other specified B group vitamins: Secondary | ICD-10-CM | POA: Insufficient documentation

## 2018-04-17 DIAGNOSIS — G629 Polyneuropathy, unspecified: Secondary | ICD-10-CM | POA: Diagnosis not present

## 2018-04-17 DIAGNOSIS — E559 Vitamin D deficiency, unspecified: Secondary | ICD-10-CM | POA: Diagnosis not present

## 2018-04-17 DIAGNOSIS — Z170 Estrogen receptor positive status [ER+]: Secondary | ICD-10-CM | POA: Diagnosis not present

## 2018-04-17 DIAGNOSIS — C773 Secondary and unspecified malignant neoplasm of axilla and upper limb lymph nodes: Secondary | ICD-10-CM | POA: Insufficient documentation

## 2018-04-17 DIAGNOSIS — F418 Other specified anxiety disorders: Secondary | ICD-10-CM | POA: Diagnosis not present

## 2018-04-17 DIAGNOSIS — F329 Major depressive disorder, single episode, unspecified: Secondary | ICD-10-CM | POA: Insufficient documentation

## 2018-04-17 DIAGNOSIS — E669 Obesity, unspecified: Secondary | ICD-10-CM | POA: Insufficient documentation

## 2018-04-17 DIAGNOSIS — G8918 Other acute postprocedural pain: Secondary | ICD-10-CM | POA: Diagnosis not present

## 2018-04-17 DIAGNOSIS — C50911 Malignant neoplasm of unspecified site of right female breast: Secondary | ICD-10-CM | POA: Diagnosis present

## 2018-04-17 HISTORY — PX: AXILLARY LYMPH NODE DISSECTION: SHX5229

## 2018-04-17 SURGERY — LYMPHADENECTOMY, AXILLARY
Anesthesia: General | Site: Axilla | Laterality: Right

## 2018-04-17 MED ORDER — ONDANSETRON HCL 4 MG/2ML IJ SOLN
INTRAMUSCULAR | Status: AC
  Filled 2018-04-17: qty 2

## 2018-04-17 MED ORDER — CEFAZOLIN SODIUM-DEXTROSE 2-4 GM/100ML-% IV SOLN
2.0000 g | INTRAVENOUS | Status: AC
  Administered 2018-04-17: 2 g via INTRAVENOUS

## 2018-04-17 MED ORDER — FLUOXETINE HCL 40 MG PO CAPS: 40.0000 mg | ORAL_CAPSULE | Freq: Every day | ORAL | Status: DC

## 2018-04-17 MED ORDER — DIPHENHYDRAMINE HCL 50 MG/ML IJ SOLN: 12.5000 mg | Freq: Four times a day (QID) | INTRAMUSCULAR | Status: DC | PRN

## 2018-04-17 MED ORDER — FENTANYL CITRATE (PF) 100 MCG/2ML IJ SOLN
INTRAMUSCULAR | Status: AC
  Filled 2018-04-17: qty 2

## 2018-04-17 MED ORDER — FENTANYL CITRATE (PF) 100 MCG/2ML IJ SOLN
50.0000 ug | INTRAMUSCULAR | Status: DC | PRN
  Administered 2018-04-17: 50 ug via INTRAVENOUS

## 2018-04-17 MED ORDER — ESMOLOL HCL 100 MG/10ML IV SOLN
INTRAVENOUS | Status: AC
  Filled 2018-04-17: qty 20

## 2018-04-17 MED ORDER — DEXAMETHASONE SODIUM PHOSPHATE 10 MG/ML IJ SOLN
INTRAMUSCULAR | Status: DC | PRN
  Administered 2018-04-17: 10 mg via INTRAVENOUS

## 2018-04-17 MED ORDER — SENNA 8.6 MG PO TABS
1.0000 | ORAL_TABLET | Freq: Two times a day (BID) | ORAL | Status: DC
  Administered 2018-04-17: 8.6 mg via ORAL
  Filled 2018-04-17: qty 1

## 2018-04-17 MED ORDER — CEFAZOLIN SODIUM-DEXTROSE 2-4 GM/100ML-% IV SOLN
INTRAVENOUS | Status: AC
  Filled 2018-04-17: qty 100

## 2018-04-17 MED ORDER — MIDAZOLAM HCL 2 MG/2ML IJ SOLN: 1.0000 mg | INTRAMUSCULAR | Status: DC | PRN

## 2018-04-17 MED ORDER — GABAPENTIN 100 MG PO CAPS
200.0000 mg | ORAL_CAPSULE | Freq: Two times a day (BID) | ORAL | Status: DC
  Administered 2018-04-17: 200 mg via ORAL

## 2018-04-17 MED ORDER — FAMOTIDINE 20 MG PO TABS
20.0000 mg | ORAL_TABLET | Freq: Two times a day (BID) | ORAL | Status: DC
  Administered 2018-04-17 (×2): 20 mg via ORAL
  Filled 2018-04-17: qty 1

## 2018-04-17 MED ORDER — LIDOCAINE HCL (CARDIAC) PF 100 MG/5ML IV SOSY
PREFILLED_SYRINGE | INTRAVENOUS | Status: AC
  Filled 2018-04-17: qty 5

## 2018-04-17 MED ORDER — BUPIVACAINE HCL (PF) 0.25 % IJ SOLN
INTRAMUSCULAR | Status: DC | PRN
  Administered 2018-04-17: 20 mL

## 2018-04-17 MED ORDER — OXYCODONE HCL 5 MG/5ML PO SOLN: 5.0000 mg | Freq: Once | ORAL | Status: DC | PRN

## 2018-04-17 MED ORDER — GABAPENTIN 300 MG PO CAPS
300.0000 mg | ORAL_CAPSULE | ORAL | Status: AC
  Administered 2018-04-17: 300 mg via ORAL

## 2018-04-17 MED ORDER — DEXAMETHASONE SODIUM PHOSPHATE 10 MG/ML IJ SOLN
INTRAMUSCULAR | Status: AC
  Filled 2018-04-17: qty 1

## 2018-04-17 MED ORDER — BUPIVACAINE HCL (PF) 0.25 % IJ SOLN
INTRAMUSCULAR | Status: AC
  Filled 2018-04-17: qty 30

## 2018-04-17 MED ORDER — BUPIVACAINE-EPINEPHRINE (PF) 0.5% -1:200000 IJ SOLN
INTRAMUSCULAR | Status: DC | PRN
  Administered 2018-04-17: 30 mL

## 2018-04-17 MED ORDER — CEFAZOLIN SODIUM-DEXTROSE 2-4 GM/100ML-% IV SOLN
2.0000 g | Freq: Three times a day (TID) | INTRAVENOUS | Status: AC
  Administered 2018-04-17: 2 g via INTRAVENOUS
  Filled 2018-04-17: qty 100

## 2018-04-17 MED ORDER — TRAZODONE HCL 100 MG PO TABS: 100.0000 mg | ORAL_TABLET | Freq: Every day | ORAL | Status: DC

## 2018-04-17 MED ORDER — ESMOLOL HCL 100 MG/10ML IV SOLN
INTRAVENOUS | Status: DC | PRN
  Administered 2018-04-17: 20 mg via INTRAVENOUS

## 2018-04-17 MED ORDER — PROPOFOL 10 MG/ML IV BOLUS
INTRAVENOUS | Status: AC
  Filled 2018-04-17: qty 20

## 2018-04-17 MED ORDER — ACETAMINOPHEN 500 MG PO TABS
ORAL_TABLET | ORAL | Status: AC
  Filled 2018-04-17: qty 2

## 2018-04-17 MED ORDER — TRAMADOL HCL 50 MG PO TABS
50.0000 mg | ORAL_TABLET | Freq: Four times a day (QID) | ORAL | Status: DC | PRN
  Administered 2018-04-17: 50 mg via ORAL
  Filled 2018-04-17: qty 1

## 2018-04-17 MED ORDER — ONDANSETRON HCL 4 MG/2ML IJ SOLN: 4.0000 mg | Freq: Four times a day (QID) | INTRAMUSCULAR | Status: DC | PRN

## 2018-04-17 MED ORDER — CHLORHEXIDINE GLUCONATE CLOTH 2 % EX PADS: 6.0000 | MEDICATED_PAD | Freq: Once | CUTANEOUS | Status: DC

## 2018-04-17 MED ORDER — OXYCODONE HCL 5 MG PO TABS: 5.0000 mg | ORAL_TABLET | ORAL | 0 refills | Status: DC | PRN

## 2018-04-17 MED ORDER — ONDANSETRON HCL 4 MG/2ML IJ SOLN
INTRAMUSCULAR | Status: DC | PRN
  Administered 2018-04-17 (×2): 4 mg via INTRAVENOUS

## 2018-04-17 MED ORDER — ROCURONIUM BROMIDE 10 MG/ML (PF) SYRINGE
PREFILLED_SYRINGE | INTRAVENOUS | Status: AC
  Filled 2018-04-17: qty 10

## 2018-04-17 MED ORDER — SUCCINYLCHOLINE CHLORIDE 200 MG/10ML IV SOSY
PREFILLED_SYRINGE | INTRAVENOUS | Status: DC | PRN
  Administered 2018-04-17: 120 mg via INTRAVENOUS

## 2018-04-17 MED ORDER — LIDOCAINE-EPINEPHRINE (PF) 1 %-1:200000 IJ SOLN
INTRAMUSCULAR | Status: AC
  Filled 2018-04-17: qty 30

## 2018-04-17 MED ORDER — OXYCODONE HCL 5 MG PO TABS
5.0000 mg | ORAL_TABLET | ORAL | Status: DC | PRN
  Administered 2018-04-17 – 2018-04-18 (×3): 5 mg via ORAL
  Filled 2018-04-17 (×3): qty 1

## 2018-04-17 MED ORDER — PROPOFOL 500 MG/50ML IV EMUL
INTRAVENOUS | Status: DC | PRN
  Administered 2018-04-17: 250 ug/kg/min via INTRAVENOUS

## 2018-04-17 MED ORDER — FENTANYL CITRATE (PF) 100 MCG/2ML IJ SOLN
25.0000 ug | INTRAMUSCULAR | Status: DC | PRN
  Administered 2018-04-17: 25 ug via INTRAVENOUS

## 2018-04-17 MED ORDER — BUPIVACAINE HCL (PF) 0.5 % IJ SOLN
INTRAMUSCULAR | Status: AC
  Filled 2018-04-17: qty 30

## 2018-04-17 MED ORDER — ONDANSETRON HCL 4 MG/2ML IJ SOLN: 4.0000 mg | Freq: Once | INTRAMUSCULAR | Status: DC | PRN

## 2018-04-17 MED ORDER — ACETAMINOPHEN 500 MG PO TABS
1000.0000 mg | ORAL_TABLET | ORAL | Status: AC
  Administered 2018-04-17: 1000 mg via ORAL

## 2018-04-17 MED ORDER — FENTANYL CITRATE (PF) 100 MCG/2ML IJ SOLN
INTRAMUSCULAR | Status: DC | PRN
  Administered 2018-04-17 (×2): 50 ug via INTRAVENOUS

## 2018-04-17 MED ORDER — SCOPOLAMINE 1 MG/3DAYS TD PT72: 1.0000 | MEDICATED_PATCH | Freq: Once | TRANSDERMAL | Status: DC | PRN

## 2018-04-17 MED ORDER — GABAPENTIN 100 MG PO CAPS
ORAL_CAPSULE | ORAL | Status: AC
  Filled 2018-04-17: qty 2

## 2018-04-17 MED ORDER — METHOCARBAMOL 500 MG PO TABS
500.0000 mg | ORAL_TABLET | Freq: Four times a day (QID) | ORAL | Status: DC | PRN
  Administered 2018-04-17 (×2): 500 mg via ORAL
  Filled 2018-04-17 (×2): qty 1

## 2018-04-17 MED ORDER — ONDANSETRON 4 MG PO TBDP: 4.0000 mg | ORAL_TABLET | Freq: Four times a day (QID) | ORAL | Status: DC | PRN

## 2018-04-17 MED ORDER — METHOCARBAMOL 500 MG PO TABS: 500.0000 mg | ORAL_TABLET | Freq: Three times a day (TID) | ORAL | 0 refills | Status: DC | PRN

## 2018-04-17 MED ORDER — GABAPENTIN 300 MG PO CAPS
ORAL_CAPSULE | ORAL | Status: AC
  Filled 2018-04-17: qty 1

## 2018-04-17 MED ORDER — LACTATED RINGERS IV SOLN
INTRAVENOUS | Status: DC
  Administered 2018-04-17: 09:00:00 via INTRAVENOUS

## 2018-04-17 MED ORDER — SUCCINYLCHOLINE CHLORIDE 200 MG/10ML IV SOSY
PREFILLED_SYRINGE | INTRAVENOUS | Status: AC
  Filled 2018-04-17: qty 10

## 2018-04-17 MED ORDER — OXYCODONE HCL 5 MG PO TABS: 5.0000 mg | ORAL_TABLET | Freq: Once | ORAL | Status: DC | PRN

## 2018-04-17 MED ORDER — DIPHENHYDRAMINE HCL 12.5 MG/5ML PO ELIX: 12.5000 mg | ORAL_SOLUTION | Freq: Four times a day (QID) | ORAL | Status: DC | PRN

## 2018-04-17 MED ORDER — ROCURONIUM BROMIDE 100 MG/10ML IV SOLN
INTRAVENOUS | Status: DC | PRN
  Administered 2018-04-17: 50 mg via INTRAVENOUS

## 2018-04-17 MED ORDER — LIDOCAINE 2% (20 MG/ML) 5 ML SYRINGE
INTRAMUSCULAR | Status: DC | PRN
  Administered 2018-04-17: 60 mg via INTRAVENOUS

## 2018-04-17 MED ORDER — SUGAMMADEX SODIUM 200 MG/2ML IV SOLN
INTRAVENOUS | Status: DC | PRN
  Administered 2018-04-17: 200 mg via INTRAVENOUS

## 2018-04-17 MED ORDER — ACETAMINOPHEN 500 MG PO TABS
1000.0000 mg | ORAL_TABLET | Freq: Four times a day (QID) | ORAL | Status: DC
Start: 2018-04-17 — End: 2018-04-18
  Administered 2018-04-17 – 2018-04-18 (×2): 1000 mg via ORAL
  Filled 2018-04-17 (×2): qty 2

## 2018-04-17 MED ORDER — PROPOFOL 10 MG/ML IV BOLUS
INTRAVENOUS | Status: DC | PRN
  Administered 2018-04-17: 150 mg via INTRAVENOUS

## 2018-04-17 MED ORDER — SIMETHICONE 80 MG PO CHEW: 40.0000 mg | CHEWABLE_TABLET | Freq: Four times a day (QID) | ORAL | Status: DC | PRN

## 2018-04-17 MED ORDER — SUGAMMADEX SODIUM 200 MG/2ML IV SOLN
INTRAVENOUS | Status: AC
  Filled 2018-04-17: qty 2

## 2018-04-17 MED ORDER — DEXTROSE IN LACTATED RINGERS 5 % IV SOLN
INTRAVENOUS | Status: AC
  Administered 2018-04-17: 13:00:00 via INTRAVENOUS

## 2018-04-17 SURGICAL SUPPLY — 61 items
APPLIER CLIP 11 MED OPEN (CLIP)
APR CLP MED 11 20 MLT OPN (CLIP)
BANDAGE ACE 6X5 VEL STRL LF (GAUZE/BANDAGES/DRESSINGS) IMPLANT
BLADE CLIPPER SURG (BLADE) ×3 IMPLANT
BLADE HEX COATED 2.75 (ELECTRODE) ×3 IMPLANT
BLADE SURG 10 STRL SS (BLADE) ×3 IMPLANT
BLADE SURG 15 STRL LF DISP TIS (BLADE) ×1 IMPLANT
BLADE SURG 15 STRL SS (BLADE) ×3
BNDG COHESIVE 4X5 TAN STRL (GAUZE/BANDAGES/DRESSINGS) ×3 IMPLANT
BNDG GAUZE ELAST 4 BULKY (GAUZE/BANDAGES/DRESSINGS) IMPLANT
CANISTER SUCT 1200ML W/VALVE (MISCELLANEOUS) ×3 IMPLANT
CHLORAPREP W/TINT 26ML (MISCELLANEOUS) ×3 IMPLANT
CLIP APPLIE 11 MED OPEN (CLIP) IMPLANT
CLIP VESOCCLUDE LG 6/CT (CLIP) ×3 IMPLANT
CLIP VESOCCLUDE MED 6/CT (CLIP) ×6 IMPLANT
CLIP VESOCCLUDE SM WIDE 6/CT (CLIP) IMPLANT
CLOSURE WOUND 1/2 X4 (GAUZE/BANDAGES/DRESSINGS) ×1
COVER MAYO STAND STRL (DRAPES) ×3 IMPLANT
DECANTER SPIKE VIAL GLASS SM (MISCELLANEOUS) IMPLANT
DRAIN CHANNEL 19F RND (DRAIN) ×3 IMPLANT
DRAPE UTILITY XL STRL (DRAPES) ×6 IMPLANT
ELECT REM PT RETURN 9FT ADLT (ELECTROSURGICAL) ×3
ELECTRODE REM PT RTRN 9FT ADLT (ELECTROSURGICAL) ×1 IMPLANT
EVACUATOR SILICONE 100CC (DRAIN) ×3 IMPLANT
GAUZE SPONGE 4X4 12PLY STRL (GAUZE/BANDAGES/DRESSINGS) ×3 IMPLANT
GLOVE BIO SURGEON STRL SZ 6 (GLOVE) ×5 IMPLANT
GLOVE BIOGEL PI IND STRL 6.5 (GLOVE) ×1 IMPLANT
GLOVE BIOGEL PI IND STRL 7.0 (GLOVE) IMPLANT
GLOVE BIOGEL PI INDICATOR 6.5 (GLOVE) ×4
GLOVE BIOGEL PI INDICATOR 7.0 (GLOVE) ×2
GLOVE ECLIPSE 6.5 STRL STRAW (GLOVE) ×2 IMPLANT
GOWN STRL REUS W/ TWL LRG LVL3 (GOWN DISPOSABLE) ×1 IMPLANT
GOWN STRL REUS W/TWL 2XL LVL3 (GOWN DISPOSABLE) ×3 IMPLANT
GOWN STRL REUS W/TWL LRG LVL3 (GOWN DISPOSABLE) ×6
LIGHT WAVEGUIDE WIDE FLAT (MISCELLANEOUS) ×2 IMPLANT
NEEDLE HYPO 25X1 1.5 SAFETY (NEEDLE) ×3 IMPLANT
NS IRRIG 1000ML POUR BTL (IV SOLUTION) ×3 IMPLANT
PACK BASIN DAY SURGERY FS (CUSTOM PROCEDURE TRAY) ×3 IMPLANT
PACK UNIVERSAL I (CUSTOM PROCEDURE TRAY) ×3 IMPLANT
PENCIL BUTTON HOLSTER BLD 10FT (ELECTRODE) ×3 IMPLANT
PIN SAFETY STERILE (MISCELLANEOUS) ×3 IMPLANT
SLEEVE SCD COMPRESS KNEE MED (MISCELLANEOUS) ×3 IMPLANT
SPONGE LAP 18X18 RF (DISPOSABLE) ×3 IMPLANT
STAPLER VISISTAT 35W (STAPLE) ×3 IMPLANT
STOCKINETTE 6  STRL (DRAPES)
STOCKINETTE 6 STRL (DRAPES) IMPLANT
STOCKINETTE IMPERVIOUS LG (DRAPES) ×3 IMPLANT
STRIP CLOSURE SKIN 1/2X4 (GAUZE/BANDAGES/DRESSINGS) ×2 IMPLANT
SUT ETHILON 2 0 FS 18 (SUTURE) ×3 IMPLANT
SUT MON AB 4-0 PC3 18 (SUTURE) ×3 IMPLANT
SUT SILK 2 0 SH (SUTURE) ×3 IMPLANT
SUT VIC AB 3-0 54X BRD REEL (SUTURE) IMPLANT
SUT VIC AB 3-0 BRD 54 (SUTURE)
SUT VICRYL 3-0 CR8 SH (SUTURE) ×3 IMPLANT
SYR BULB 3OZ (MISCELLANEOUS) IMPLANT
SYR CONTROL 10ML LL (SYRINGE) ×3 IMPLANT
TOWEL GREEN STERILE FF (TOWEL DISPOSABLE) ×6 IMPLANT
TOWEL OR NON WOVEN STRL DISP B (DISPOSABLE) ×3 IMPLANT
TUBE CONNECTING 20'X1/4 (TUBING) ×1
TUBE CONNECTING 20X1/4 (TUBING) ×2 IMPLANT
YANKAUER SUCT BULB TIP NO VENT (SUCTIONS) ×3 IMPLANT

## 2018-04-17 NOTE — Addendum Note (Signed)
Addendum  created 04/17/18 1359 by Gwyndolyn Saxon, CRNA   Charge Capture section accepted

## 2018-04-17 NOTE — Anesthesia Preprocedure Evaluation (Addendum)
Anesthesia Evaluation  Patient identified by MRN, date of birth, ID band Patient awake    Reviewed: Allergy & Precautions, NPO status , Patient's Chart, lab work & pertinent test results  Airway Mallampati: III  TM Distance: >3 FB Neck ROM: Full    Dental  (+) Teeth Intact, Dental Advisory Given   Pulmonary neg pulmonary ROS,    Pulmonary exam normal breath sounds clear to auscultation       Cardiovascular negative cardio ROS Normal cardiovascular exam Rhythm:Regular Rate:Normal     Neuro/Psych  Headaches, Anxiety Depression Peripheral neuropathy; hx brain cancer  Neuromuscular disease    GI/Hepatic Neg liver ROS, GERD  Medicated and Controlled,  Endo/Other  Obesity  Renal/GU negative Renal ROS  negative genitourinary   Musculoskeletal  (+) Arthritis ,   Abdominal (+) + obese,   Peds  Hematology negative hematology ROS (+)   Anesthesia Other Findings   Reproductive/Obstetrics DUB                             Anesthesia Physical  Anesthesia Plan  ASA: II  Anesthesia Plan: General   Post-op Pain Management:  Regional for Post-op pain   Induction: Intravenous  PONV Risk Score and Plan: 4 or greater and Ondansetron, Scopolamine patch - Pre-op, Treatment may vary due to age or medical condition and TIVA  Airway Management Planned: Oral ETT  Additional Equipment: None  Intra-op Plan:   Post-operative Plan: Extubation in OR  Informed Consent: I have reviewed the patients History and Physical, chart, labs and discussed the procedure including the risks, benefits and alternatives for the proposed anesthesia with the patient or authorized representative who has indicated his/her understanding and acceptance.   Dental advisory given  Plan Discussed with: CRNA and Anesthesiologist  Anesthesia Plan Comments:       Anesthesia Quick Evaluation

## 2018-04-17 NOTE — Progress Notes (Signed)
Assisted Dr. Fransisco Beau with right, ultrasound guided, pectoralis block. Side rails up, monitors on throughout procedure. See vital signs in flow sheet. Tolerated Procedure well.

## 2018-04-17 NOTE — Anesthesia Procedure Notes (Signed)
Procedure Name: Intubation Date/Time: 04/17/2018 9:50 AM Performed by: Gwyndolyn Saxon, CRNA Pre-anesthesia Checklist: Patient identified, Emergency Drugs available, Suction available, Patient being monitored and Timeout performed Patient Re-evaluated:Patient Re-evaluated prior to induction Oxygen Delivery Method: Circle system utilized Preoxygenation: Pre-oxygenation with 100% oxygen Induction Type: Rapid sequence and Cricoid Pressure applied Laryngoscope Size: Miller and 2 Grade View: Grade I Tube type: Oral Tube size: 7.0 mm Number of attempts: 1 Airway Equipment and Method: Patient positioned with wedge pillow Placement Confirmation: ETT inserted through vocal cords under direct vision,  positive ETCO2,  CO2 detector and breath sounds checked- equal and bilateral Secured at: 20 cm Tube secured with: Tape Dental Injury: Teeth and Oropharynx as per pre-operative assessment  Comments: Upper dentures removed in OR

## 2018-04-17 NOTE — Discharge Instructions (Signed)
Surgical Drain Home Care °Surgical drains are used to remove extra fluid that normally builds up in a surgical wound after surgery. A surgical drain helps to heal a surgical wound. Different kinds of surgical drains include: °· Active drains. These drains use suction to pull drainage away from the surgical wound. Drainage flows through a tube to a container outside of the body. It is important to keep the bulb or the drainage container flat (compressed) at all times, except while you empty it. Flattening the bulb or container creates suction. The two most common types of active drains are bulb drains and Hemovac drains. °· Passive drains. These drains allow fluid to drain naturally, by gravity. Drainage flows through a tube to a bandage (dressing) or a container outside of the body. Passive drains do not need to be emptied. The most common type of passive drain is the Penrose drain. ° °A drain is placed during surgery. Immediately after surgery, drainage is usually bright red and a little thicker than water. The drainage may gradually turn yellow or pink and become thinner. It is likely that your health care provider will remove the drain when the drainage stops or when the amount decreases to 1-2 Tbsp (15-30 mL) during a 24-hour period. °How to care for your surgical drain °· Keep the skin around the drain dry and covered with a dressing at all times. °· Check your drain area every day for signs of infection. Check for: °? More redness, swelling, or pain. °? Pus or a bad smell. °? Cloudy drainage. °Follow instructions from your health care provider about how to take care of your drain and how to change your dressing. Change your dressing at least one time every day. Change it more often if needed to keep the dressing dry. Make sure you: °1. Gather your supplies, including: °? Tape. °? Germ-free cleaning solution (sterile saline). °? Split gauze drain sponge: 4 x 4 inches (10 x 10 cm). °? Gauze square: 4 x 4 inches  (10 x 10 cm). °2. Wash your hands with soap and water before you change your dressing. If soap and water are not available, use hand sanitizer. °3. Remove the old dressing. Avoid using scissors to do that. °4. Use sterile saline to clean your skin around the drain. °5. Place the tube through the slit in a drain sponge. Place the drain sponge so that it covers your wound. °6. Place the gauze square or another drain sponge on top of the drain sponge that is on the wound. Make sure the tube is between those layers. °7. Tape the dressing to your skin. °8. If you have an active bulb or Hemovac drain, tape the drainage tube to your skin 1-2 inches (2.5-5 cm) below the place where the tube enters your body. Taping keeps the tube from pulling on any stitches (sutures) that you have. °9. Wash your hands with soap and water. °10. Write down the color of your drainage and how often you change your dressing. ° °How to empty your active bulb or Hemovac drain °1. Make sure that you have a measuring cup that you can empty your drainage into. °2. Wash your hands with soap and water. If soap and water are not available, use hand sanitizer. °3. Gently move your fingers down the tube while squeezing very lightly. This is called stripping the tube. This clears any drainage, clots, or tissue from the tube. °? Do not pull on the tube. °? You may need to strip   the tube several times every day to keep the tube clear. 4. Open the bulb cap or the drain plug. Do not touch the inside of the cap or the bottom of the plug. 5. Empty all of the drainage into the measuring cup. 6. Compress the bulb or the container and replace the cap or the plug. To compress the bulb or the container, squeeze it firmly in the middle while you close the cap or plug the container. 7. Write down the amount of drainage that you have in each 24-hour period. If you have less than 2 Tbsp (30 mL) of drainage during 24 hours, contact your health care  provider. 8. Flush the drainage down the toilet. 9. Wash your hands with soap and water. Contact a health care provider if:  You have more redness, swelling, or pain around your drain area.  The amount of drainage that you have is increasing instead of decreasing.  You have pus or a bad smell coming from your drain area.  You have a fever.  You have drainage that is cloudy.  There is a sudden stop or a sudden decrease in the amount of drainage that you have.  Your tube falls out.  Your active draindoes not stay compressedafter you empty it. This information is not intended to replace advice given to you by your health care provider. Make sure you discuss any questions you have with your health care provider. Document Released: 10/07/2000 Document Revised: 03/17/2016 Document Reviewed: 04/29/2015 Elsevier Interactive Patient Education  2018 [employer].   CCS___[facility], [place] [phone] AXILLARY LYMPH NODE DISSECTION: POST OP INSTRUCTIONS  Always review your discharge instruction sheet given to you by the facility where your surgery was performed. IF YOU HAVE DISABILITY OR FAMILY LEAVE FORMS, YOU MUST BRING THEM TO THE OFFICE FOR PROCESSING.   DO NOT GIVE THEM TO YOUR DOCTOR. A prescription for pain medication may be given to you upon discharge.  Take your pain medication as prescribed, if needed.  If narcotic pain medicine is not needed, then you may take acetaminophen (Tylenol) or ibuprofen (Advil) as needed. 1. Take your usually prescribed medications unless otherwise directed. 2. If you need a refill on your pain medication, please contact your pharmacy.  They will contact our office to request authorization.  Prescriptions will not be filled after 5pm or on week-ends. 3. You should follow a light diet the first few days after arrival home, such as soup and crackers, etc.  Resume your normal diet the day after surgery. 4. Most patients will experience some  swelling and bruising on the chest and underarm.  Ice packs will help.  Swelling and bruising can take several days to resolve.  5. It is common to experience some constipation if taking pain medication after surgery.  Increasing fluid intake and taking a stool softener (such as Colace) will usually help or prevent this problem from occurring.  A mild laxative (Milk of Magnesia or Miralax) should be taken according to package instructions if there are no bowel movements after 48 hours. 6. Unless discharge instructions indicate otherwise, leave your bandage dry and in place until your next appointment in 3-5 days.  You may take a limited sponge bath.  No tube baths or showers until the drains are removed.  You may have steri-strips (small skin tapes) in place directly over the incision.  These strips should be left on the skin for 7-10 days.  If your surgeon used skin glue on the incision,  you may shower in 24 hours.  The glue will flake off over the next 2-3 weeks.  Any sutures or staples will be removed at the office during your follow-up visit. 7. DRAINS:  If you have drains in place, it is important to keep a list of the amount of drainage produced each day in your drains.  Before leaving the hospital, you should be instructed on drain care.  Call our office if you have any questions about your drains. 8. ACTIVITIES:  You may resume regular (light) daily activities beginning the next day--such as daily self-care, walking, climbing stairs--gradually increasing activities as tolerated.  You may have sexual intercourse when it is comfortable.  Refrain from any heavy lifting or straining until approved by your doctor. a. You may drive when you are no longer taking prescription pain medication, you can comfortably wear a seatbelt, and you can safely maneuver your car and apply brakes. b. RETURN TO WORK:  __________________________________________________________ 9. You should see your doctor in the office for a  follow-up appointment approximately 3-5 days after your surgery.  Your doctors nurse will typically make your follow-up appointment when she calls you with your pathology report.  Expect your pathology report 2-3 business days after your surgery.  You may call to check if you do not hear from [place] after three days.   10. OTHER INSTRUCTIONS: ______________________________________________________________________________________________ ____________________________________________________________________________________________ WHEN TO CALL YOUR DOCTOR: 1. Fever over 101.0 2. Nausea and/or vomiting 3. Extreme swelling or bruising 4. Continued bleeding from incision. 5. Increased pain, redness, or drainage from the incision. The clinic staff is available to answer your questions during regular business hours.  Please dont hesitate to call and ask to speak to one of the nurses for clinical concerns.  If you have a medical emergency, go to the nearest emergency room or call 911.  A surgeon from [facility] is always on call at the hospital. [address] [place], [place], [place]  [address] ? [address] [place], [place],    [address] [phone] ? [phone] ? FAX [phone] Web site: [email or web address]

## 2018-04-17 NOTE — Anesthesia Postprocedure Evaluation (Signed)
Anesthesia Post Note  Patient: Amy Roman  Procedure(s) Performed: AXILLARY LYMPH NODE DISSECTION (Right Axilla)     Patient location during evaluation: PACU Anesthesia Type: General Level of consciousness: awake and alert Pain management: pain level controlled Vital Signs Assessment: post-procedure vital signs reviewed and stable Respiratory status: spontaneous breathing, nonlabored ventilation and respiratory function stable Cardiovascular status: blood pressure returned to baseline and stable Postop Assessment: no apparent nausea or vomiting Anesthetic complications: no    Last Vitals:  Vitals:   04/17/18 1215 04/17/18 1230  BP: 116/70 116/69  Pulse: 65 63  Resp: 16 13  Temp:    SpO2: 99% 98%    Last Pain:  Vitals:   04/17/18 1215  TempSrc:   PainSc: [place]

## 2018-04-17 NOTE — Transfer of Care (Signed)
Immediate Anesthesia Transfer of Care Note  Patient: Amy Roman  Procedure(s) Performed: AXILLARY LYMPH NODE DISSECTION (Right Axilla)  Patient Location: PACU  Anesthesia Type:GA combined with regional for post-op pain  Level of Consciousness: awake  Airway & Oxygen Therapy: Patient Spontanous Breathing and Patient connected to face mask oxygen  Post-op Assessment: Report given to RN and Post -op Vital signs reviewed and stable  Post vital signs: Reviewed and stable  Last Vitals:  Vitals Value Taken Time  BP 123/48 04/17/2018 11:27 AM  Temp    Pulse    Resp 13 04/17/2018 11:29 AM  SpO2    Vitals shown include unvalidated device data.  Last Pain:  Vitals:   04/17/18 0842  TempSrc: Oral  PainSc: 4       Patients Stated Pain Goal: 2 (92/90/90 3014)  Complications: No apparent anesthesia complications

## 2018-04-17 NOTE — Anesthesia Procedure Notes (Addendum)
Anesthesia Regional Block: Pectoralis block   Pre-Anesthetic Checklist: ,, timeout performed, Correct Patient, Correct Site, Correct Laterality, Correct Procedure, Correct Position, site marked, Risks and benefits discussed,  Surgical consent,  Pre-op evaluation,  At surgeon's request and post-op pain management  Laterality: Right  Prep: chloraprep       Needles:  Injection technique: Single-shot  Needle Type: Echogenic Needle     Needle Length: 9cm  Needle Gauge: 21     Additional Needles:   Narrative:  Start time: 04/17/2018 9:17 AM End time: 04/17/2018 9:22 AM Injection made incrementally with aspirations every 5 mL.  Performed by: Personally  Anesthesiologist: Audry Pili, MD  Additional Notes: Poor imaging quality due to combination of edema and post-surgical pain from recent procedure, inability to apply proper pressure to US probe without significant patient discomfort. No pain on injection. No increased resistance to injection. Injection made in 5cc increments. Good needle visualization. Patient tolerated the procedure well.

## 2018-04-17 NOTE — Interval H&P Note (Signed)
History and Physical Interval Note:  04/17/2018 9:23 AM  Amy Roman  has presented today for surgery, with the diagnosis of RIGHT BREAST CANCER  The various methods of treatment have been discussed with the patient and family. After consideration of risks, benefits and other options for treatment, the patient has consented to  Procedure(s): AXILLARY LYMPH NODE DISSECTION (Right) as a surgical intervention .  The patient's history has been reviewed, patient examined, no change in status, stable for surgery.  I have reviewed the patient's chart and labs.  Questions were answered to the patient's satisfaction.     Stark Klein

## 2018-04-17 NOTE — Op Note (Signed)
PRE-OPERATIVE DIAGNOSIS: right breast cancer, pmT2pN1c, LOQ, grade 1, invasive ductal, +/+/-  POST-OPERATIVE DIAGNOSIS:  Same  PROCEDURE:  Procedure(s): Right axillary lymph node dissection.    SURGEON:  Surgeon(s): Stark Klein, MD  ANESTHESIA:   local, regional and general  DRAINS: (19 Fr ) Blake drain(s) in the right axilla   LOCAL MEDICATIONS USED:  BUPIVICAINE  and LIDOCAINE   SPECIMEN:  Source of Specimen:  right axillary contents  DISPOSITION OF SPECIMEN:  PATHOLOGY  COUNTS:  YES  DICTATION: .Dragon Dictation  PLAN OF CARE: Admit for overnight observation  PATIENT DISPOSITION:  PACU - hemodynamically stable.  FINDINGS:  No gross disease in axilla.  Good visualization of thoracodorsal nerve and long thoracic nerve  EBL: min, but 500 mL seroma  PROCEDURE:   Patient was identified in the holding area and taken to the operating room where she was placed supine on operating room table.  General endotracheal anesthesia was induced.  The patient's arm, axilla, and breast on the right were prepped and draped in sterile fashion.  A timeout was performed according to the surgical safety checklist.  When all was correct, we continued.  The previous sentinel node incision was opened with a #10 blade.  A large seroma was drained.  The seroma was communicating with the one in the breast.  Once this was fully drained, we continued.   An axillary dissection was performed with removal of the associated lymph nodes and surrounding adipose tissue. This included levels I and II. This was accomplished by exposing the axillary vein anteriorly and inferiorly to the level of the pectoralis minor and laterally over the latissimus dorsi muscle. Posteriorly, the dissection continued to the subscapularis.  Small venous tributaries, lymphatics, and vessels were clipped and ligated or cauterized and divided. The subscapularis muscle was skeletonized. The long thoracic and thoracodorsal neurovascular  bundles were identified and preserved.    The wound was irrigated and hemostasis was achieved with cautery.  A 19 Fr Blake drain was placed in the axilla and looped into the breast cavity.  It was secured with a 2-0 nylon.  The wound was then closed with a 3-0 Vicryl deep dermal interrupted and a 4-0 monocryl subcuticular closure in layers.  The wound was cleaned, dried, and dressed with dermabond and steristrips.  This was followed by gauze, ABDs and a breast binder.    The patient was allowed to emerge from anesthesia and was taken to the PACU in stable condition.  Needle, sponge, and instrument counts were correct x 2.

## 2018-04-18 ENCOUNTER — Encounter (HOSPITAL_BASED_OUTPATIENT_CLINIC_OR_DEPARTMENT_OTHER): Payer: Self-pay | Admitting: General Surgery

## 2018-04-18 DIAGNOSIS — C50511 Malignant neoplasm of lower-outer quadrant of right female breast: Secondary | ICD-10-CM | POA: Diagnosis not present

## 2018-04-18 DIAGNOSIS — E559 Vitamin D deficiency, unspecified: Secondary | ICD-10-CM | POA: Diagnosis not present

## 2018-04-18 DIAGNOSIS — F329 Major depressive disorder, single episode, unspecified: Secondary | ICD-10-CM | POA: Diagnosis not present

## 2018-04-18 DIAGNOSIS — C773 Secondary and unspecified malignant neoplasm of axilla and upper limb lymph nodes: Secondary | ICD-10-CM | POA: Diagnosis not present

## 2018-04-18 DIAGNOSIS — N641 Fat necrosis of breast: Secondary | ICD-10-CM | POA: Diagnosis not present

## 2018-04-18 DIAGNOSIS — E538 Deficiency of other specified B group vitamins: Secondary | ICD-10-CM | POA: Diagnosis not present

## 2018-04-19 ENCOUNTER — Other Ambulatory Visit (HOSPITAL_COMMUNITY): Payer: Self-pay

## 2018-04-19 ENCOUNTER — Encounter (HOSPITAL_COMMUNITY): Payer: Medicare Other

## 2018-04-19 NOTE — Discharge Summary (Signed)
Physician Discharge Summary  Patient ID: Amy Roman MRN: [id number] DOB/AGE: 1951-10-05 67 y.o.  Admit date: 04/17/2018 Discharge date: 04/19/2018  Admission Diagnoses: Patient Active Problem List   Diagnosis Date Noted  . 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 Malignant neoplasm of 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, estrogen receptor positive ([place]) 03/11/2018  . Post-operative state 03/06/2018  . Uterine hyperplasia 10/11/2017  . Anxiety and depression 10/15/2014  . Vitamin B 12 deficiency 06/02/2014  . Vitamin D deficiency 06/02/2014  . Hereditary and idiopathic peripheral neuropathy 05/14/2014    Discharge Diagnoses:  Active Problems:   Breast cancer metastasized to axillary lymph node, right ([place]) and same as above.    Discharged Condition: stable  Hospital Course:  Pt was admitted to the [facility] following right axillary lymph node dissection.  She was able to have pain controlled with oral medication.  She was ambulatory and was able to manage drains with her family.  She was able to eat.  She was discharged in stable condition.    Consults: None  Significant Diagnostic Studies: labs: none  Treatments: surgery: see above.   Discharge Exam: Blood pressure 114/73, pulse 66, temperature (!) 97.3 F (36.3 C), resp. rate 18, height 5\' 1"  (1.549 m), weight 83.5 kg (184 lb), SpO2 98 %. General appearance: alert, cooperative and no distress Resp: breathing comfortably Breasts: much less swollen post op.    Disposition:    Allergies as of 04/18/2018   No Known Allergies     Medication List    TAKE these medications   b complex vitamins tablet Take 1 tablet by mouth daily.   FLUoxetine 40 MG capsule Commonly known as:  PROZAC Take 40 mg by mouth daily.   gabapentin 800 MG tablet Commonly known as:  NEURONTIN Take 200 mg by mouth 4 (four) times daily as needed.   methocarbamol 500 MG tablet Commonly known  as:  ROBAXIN Take 1 tablet (500 mg total) by mouth every 8 (eight) hours as needed (use for muscle cramps/pain).   oxyCODONE 5 MG immediate release tablet Commonly known as:  Oxy IR/ROXICODONE Take 1 tablet (5 mg total) by mouth every 4 (four) hours as needed for severe pain.   ranitidine 150 MG tablet Commonly known as:  ZANTAC Take 150 mg by mouth 2 (two) times daily.   traZODone 100 MG tablet Commonly known as:  DESYREL Take 100 mg by mouth at bedtime.      Follow-up Information    Stark Klein, MD In 2 weeks.   Specialty:  General Surgery Contact information: [address] [place] [address] [phone]           Signed: Stark Klein 04/19/2018, 10:43 AM

## 2018-04-20 ENCOUNTER — Telehealth: Payer: Self-pay | Admitting: *Deleted

## 2018-04-20 NOTE — Telephone Encounter (Signed)
Received order for Mammaprint testing. Req uisiition faxed to pathology and [facility]

## 2018-04-23 ENCOUNTER — Encounter: Payer: Self-pay | Admitting: General Practice

## 2018-04-23 ENCOUNTER — Ambulatory Visit (INDEPENDENT_AMBULATORY_CARE_PROVIDER_SITE_OTHER): Payer: Medicare Other | Admitting: Obstetrics & Gynecology

## 2018-04-23 ENCOUNTER — Encounter: Payer: Self-pay | Admitting: Obstetrics & Gynecology

## 2018-04-23 VITALS — BP 115/56 | HR 66 | Ht 61.0 in | Wt 187.0 lb

## 2018-04-23 DIAGNOSIS — Z9889 Other specified postprocedural states: Secondary | ICD-10-CM

## 2018-04-23 NOTE — Progress Notes (Signed)
   Subjective:    Patient ID: Amy Roman, female    DOB: 11-Apr-1951, 67 y.o.   MRN: [id number]  HPI 67 yo lady here for a post op visit after having a TVH/BSO for atypical uterine hyperplasia. She is having no post op problems. She reports normal bowel and bladder function. She is suffering with depression and requests that I give her a letter stating that she would feel better if her son were allowed to come and visit her from [place].  She also has a drain in her right breast from her 2 breast surgeries for breast cancer.  Review of Systems     Objective:   Physical Exam Breathing, conversing, and ambulating normally Well nourished, well hydrated female, no apparent distress Abd- benign Pathology benign     Assessment & Plan:  Post op- doing well

## 2018-04-27 ENCOUNTER — Telehealth: Payer: Self-pay | Admitting: Hematology

## 2018-04-27 ENCOUNTER — Encounter (HOSPITAL_COMMUNITY): Payer: Self-pay | Admitting: Hematology

## 2018-04-27 ENCOUNTER — Telehealth: Payer: Self-pay | Admitting: *Deleted

## 2018-04-27 NOTE — Telephone Encounter (Signed)
LMVM for patient with date/time of appointment added to schedule per 7/5 sch msg

## 2018-04-27 NOTE — Telephone Encounter (Signed)
Received Mammaprint result of low risk. Physician team notified.

## 2018-05-01 NOTE — Progress Notes (Signed)
[place]  [phone] [phone]  Clinic Follow Up Note   Patient Care Team: Lin Landsman, MD as PCP - General (Family Medicine) Alda Berthold, DO as Consulting Physician (Neurology) Truitt Merle, MD as Consulting Physician (Hematology) Stark Klein, MD as Consulting Physician (General Surgery) Eppie Gibson, MD as Attending Physician (Radiation Oncology)   Date of Service:  05/03/2018  CHIEF COMPLAINTS/PURPOSE OF CONSULTATION:  Follow up for right breast cancer    Oncology History   Cancer Staging Malignant neoplasm of 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, estrogen receptor positive ([place]) Staging form: Breast, AJCC 8th Edition - Clinical stage from 02/16/2018: Stage IB (cT2(m), cN0, cM0, G2, ER+, PR+, HER2-) - Signed by Truitt Merle, MD on 03/11/2018      Malignant neoplasm of 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, estrogen receptor positive ([place])   01/30/2018 Mammogram    Screening mammogram FINDINGS: In the right breast, possible distortion warrants further evaluation. The patient has had an excisional biopsy of her right breast upper outer quadrant in 2007, however this distortion is either better seen or new from her prior mammogram, and it does not quite match the surgical site as marked by a skin scar marker. In the left breast, no findings suspicious for malignancy.      02/14/2018 Mammogram    Diagnostic mammogram: In the lateral aspect of the right breast, posterior depth there is a prominent area of distortion at the approximate 9 o'clock location. While this is in the region of the excisional biopsy that the patient had in 2007, the distortion has become significantly more prominent as compared to the tomosynthesis mammogram performed in September of 2016, and appears to be inferior to the surgical scar.      02/14/2018 Breast US    Ultrasound of the right breast at 8:30, 4 cm from the nipple demonstrates an  irregular hypoechoic vascular mass with spiculated margins measuring 2.1 x 2.0 x 1.8 cm. There is a small adjacent mass at 9 o'clock, 4 cm from the nipple, which is 1.2 cm away from the dominant mass. The smaller mass measures 1.1 x 0.7 x 0.7 cm. Together, the 2 masses span 3.6 cm of tissue. Ultrasound of the right axilla demonstrates multiple normal-appearing lymph nodes.  IMPRESSION: 1. There is a highly suspicious mass in the right breast at 830 with a small possible satellite lesion at 9 o'clock. 2.  No evidence of right axillary lymphadenopathy.      02/16/2018 Cancer Staging    Staging form: Breast, AJCC 8th Edition - Clinical stage from 02/16/2018: Stage IB (cT2(m), cN0, cM0, G2, ER+, PR+, HER2-) - Signed by Truitt Merle, MD on 03/11/2018      02/16/2018 Initial Biopsy    Diagnosis 1. Breast, right, needle core biopsy, 8:30 o'clock - INVASIVE DUCTAL CARCINOMA, SEE COMMENT. 2. Breast, right, needle core biopsy, 9 o'clock - INVASIVE DUCTAL CARCINOMA. - LOBULAR NEOPLASIA (ATYPICAL LOBULAR HYPERPLASIA).  ER: 95%, positive, strong staining PR: 80%, positive, moderate staining  Proliferation Marker Ki67: 2%  HER2 - Negative Ratio of HER2/CEP17 signals: 1.80 Average HER2 copy number per cell: 2.97       03/01/2018 Imaging    MR Bilateral breast IMPRESSION: Two areas of known malignancy in the LATERAL portion of the RIGHT breast. No additional areas of concern in either breast. No axillary adenopathy.      03/11/2018 Initial Diagnosis    Malignant neoplasm of 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, estrogen receptor  positive ([place])      04/05/2018 Surgery    RIGHT BREAST SEED BRACKETED LUMPECTOMY WITH SENTINEL LYMPH NODE BIOPSY by Dr. Barry Dienes 04/05/18      04/05/2018 Pathology Results    Surgery  Diagnosis 04/05/18  1. Breast, lumpectomy, right - INVASIVE DUCTAL CARCINOMA GRADE I/III, TWO FOCI SPANNING 2.9 CM AND 0.9 CM. - LOBULAR NEOPLASIA (LOBULAR CARCINOMA IN SITU). -  DUCTAL CARCINOMA IN SITU, LOW GRADE. - LYMPHOVASCULAR INVASION IS IDENTIFIED. - THE SURGICAL RESECTION MARGINS ARE NEGATIVE FOR DUCTAL CARCINOMA. - SEE ONCOLOGY TABLE BELOW. 2. Lymph node, sentinel, biopsy, right - THERE IS NO EVIDENCE OF CARCINOMA IN 1 OF 1 LYMPH NODE (0/1). 3. Lymph node, sentinel, biopsy, right - METASTATIC CARCINOMA IN 1 OF 1 LYMPH NODE (1/1) WITH EXTRACAPSULAR EXTENSION. - EXTRACAPSULAR EXTENSION EXTENDS 0.3 CM. 4. Lymph node, sentinel, biopsy, right - DUCTAL CARCINOMA. - PERINEURAL INVASION IS IDENTIFIED. - SEE COMMENT. 5. Lymph node, sentinel, biopsy, right 1 of 4      04/05/2018 Miscellaneous    Mammaprint Low Risk with 10% risk of recurrance with Tamoxifen alone  MPI at +0.240 DMFI at 97.8%      04/12/2018 Cancer Staging    Staging form: Breast, AJCC 8th Edition - Pathologic: Stage IA (pT2, pN1a, cM0, G1, ER+, PR+, HER2-) - Signed by Truitt Merle, MD on 04/12/2018      04/13/2018 Imaging    Whole Body Bone scan 04/13/18 IMPRESSION: No scintigraphic evidence of osseous metastatic disease.      04/16/2018 Imaging    CT CAP W Contrast 04/16/18  IMPRESSION: 1. No definite findings of metastatic disease in the chest. Solitary solid 4 mm right lower lobe pulmonary nodule, for which initial chest CT follow-up is advised in 3 months. 2. Postsurgical changes from right breast lumpectomy and right axillary node dissection. Thin-walled fluid collections in the right breast and right axilla are most compatible with postsurgical seromas. 3. No evidence of metastatic disease in the abdomen or pelvis.       04/17/2018 Surgery    AXILLARY LYMPH NODE DISSECTION by Dr. Barry Dienes 04/17/18        04/17/2018 Pathology Results    Diagnosis 04/17/18  Lymph nodes, regional resection, Right Axillary - BENIGN FIBROADIPOSE AND BREAST TISSUE WITH FAT NECROSIS. - NO LYMPHOID TISSUE PRESENT. - NO MALIGNANCY IDENTIFIED. Microscopic Comment The entire specimen was submitted.          HISTORY OF PRESENTING ILLNESS:  Amy Roman 67 y.o. female is here because of newly diagnosed right breast cancer. She was referred by her surgeon, Dr. Barry Dienes for further evaluation. She is accompanied by an interpretor today. She denies feeling a lump prior to the mammogram. She denies prior breast cancer, however she notes that she had a biopsy in 2007 that returned negative.  She had a screening mammogram on 01/30/2018 that showed: Breast density category C. Further evaluation is suggested for possible distortion in the right breast.  She then proceeded to a diagnostic mammogram and right ultrasonography on 02/14/2018 with results showing: There is a highly suspicious mass in the right breast at 8:30 with a small possible satellite lesion at 9 o'clock. No evidence of right axillary lymphadenopathy.  She also had a MR Bilateral breast on 03/01/2018 showing: Two areas of known malignancy in the LATERAL portion of the RIGHT breast. No additional areas of concern in either breast. No axillary adenopathy.  She had a right needle core biopsy on 02/16/2018 with results showing: Breast, right, needle core biopsy,  8:30 o'clock with invasive ductal carcinoma. Breast, right, needle core biopsy, 9 o'clock with invasive ductal carcinoma. Lobular neoplasia (atypical lobular hyperplasia). Prognostic indicators significant for: ER, 95% positive with strong staining intensity and PR, 80% positive, with moderate staining intensity. Proliferation marker Ki67 at 2%. HER2 negative.  She also had a hysterectomy with BSO due to vaginal bleeding intermittently occurring since October 2018 completed on 03/06/2018 with pathology showing: Uterus, ovaries and fallopian tubes, cervix: Cervix with nabothian cysts and squamous metaplasia. No dysplasia or malignancy. Endometrium with inactive endometrium. No hyperplasia or malignancy. Myometrium unremarkable with no evidence of malignancy. Uterine serosa unremarkable  with no endometriosis or malignancy. Right and left ovaries unremarkable with no endometriosis or malignancy. Right and left fallopian tubes with benign paratubal cyst. No endometriosis or malignancy.   On review of systems, she reports lower abdominal pain and lower back pain following her recent hysterotomy and BSO. she denies back pain and any other symptoms. Pertinent positives are listed and detailed within the above HPI.  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 She denies a hx of DM or any other hx of cancer. She has a FHx of her maternal aunt with leukemia and a cousin with  Another type of cancer with lumps to her neck, however she is unsure of the name of the cancer. She denies a family hx of breast cancer or ovarian cancer. She doesn't smoke cigarettes or consume ETOH and she notes that she is retired at this time. 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 She has 4 sons, 3 of which live with her and the other lives out of the country with her youngest son being age 70 and eldest age 45 and she has 5 grandchildren.   GYN history:  Menarche: 27-93 years old Age at first live birth: 67 years old GP: GxP4 LMP: in her 25's  Contraceptive: N/a HRT: no   The patient is accompanied by a medical interpretor today, who the patient has elected to translate for her during this visit.     INTERVAL HISTORY   Amy Roman is her for a follow up R ANLD surgery to discussed her pathology. She presents to the clinic today accompanied by her son, her friend's daughter and medical interpreter.  She notes pain in her right axilla post surgery, she denies swelling. She notes she had been taking her tumeric for over a year and thinks this has helped her.    MEDICAL HISTORY:  Past Medical History:  Diagnosis Date  . Anxiety and depression 10/15/2014  . Arthritis    BILATERAL KNEES, HAD CORTISONE INJECTION 03/2017  . Brain cancer ([place])   .  Depression   . GERD (gastroesophageal reflux disease)    OCCASIONAL WITH CERTAIN FOOD  . Headache   . Seasonal allergies   . SVD (spontaneous vaginal delivery)    x 4  . Unspecified hereditary and idiopathic peripheral neuropathy 05/14/2014   LEFT KNEE AND FOOT    SURGICAL HISTORY: Past Surgical History:  Procedure Laterality Date  . AXILLARY LYMPH NODE DISSECTION Right 04/17/2018   Procedure: AXILLARY LYMPH NODE DISSECTION;  Surgeon: Stark Klein, MD;  Location: [facility];  Service: General;  Laterality: Right;  . BREAST EXCISIONAL BIOPSY Right   . BREAST LUMPECTOMY  2007   RIGHT BREAST  . BREAST LUMPECTOMY WITH RADIOACTIVE SEED AND SENTINEL LYMPH NODE BIOPSY Right 04/05/2018   Procedure: RIGHT BREAST SEED BRACKETED  LUMPECTOMY WITH SENTINEL LYMPH NODE BIOPSY;  Surgeon: Stark Klein, MD;  Location: [facility];  Service: General;  Laterality: Right;  . BREAST SURGERY     bx, no cancer   . CATARACT EXTRACTION Bilateral   . DILATATION & CURETTAGE/HYSTEROSCOPY WITH MYOSURE N/A 08/09/2017   Procedure: DILATATION & CURETTAGE/HYSTEROSCOPY WITH MYOSURE;  Surgeon: Emily Filbert, MD;  Location: [facility];  Service: Gynecology;  Laterality: N/A;  . LABIOPLASTY  03/06/2018   Procedure: LABIAPLASTY;  Surgeon: Emily Filbert, MD;  Location: [facility];  Service: Gynecology;;  . VAGINAL HYSTERECTOMY Bilateral 03/06/2018   Procedure: HYSTERECTOMY VAGINAL WITH BILATERAL SALPINGOOPHORECTOMY;  Surgeon: Emily Filbert, MD;  Location: [facility];  Service: Gynecology;  Laterality: Bilateral;    SOCIAL HISTORY: Social History   Socioeconomic History  . Marital status: Married    Spouse name: Not on file  . Number of children: 4  . Years of education: Not on file  . Highest education level: Not on file  Occupational History  . Occupation: house   Social Needs  . Financial resource strain: Not on file  . Food insecurity:    Worry: Not on file    Inability: Not on file  .  Transportation needs:    Medical: Not on file    Non-medical: Not on file  Tobacco Use  . Smoking status: Never Smoker  . Smokeless tobacco: Never Used  Substance and Sexual Activity  . Alcohol use: No  . Drug use: No  . Sexual activity: Not on file  Lifestyle  . Physical activity:    Days per week: Not on file    Minutes per session: Not on file  . Stress: Not on file  Relationships  . Social connections:    Talks on phone: Not on file    Gets together: Not on file    Attends religious service: Not on file    Active member of club or organization: Not on file    Attends meetings of clubs or organizations: Not on file    Relationship status: Not on file  . Intimate partner violence:    Fear of current or ex partner: Not on file    Emotionally abused: Not on file    Physically abused: Not on file    Forced sexual activity: Not on file  Other Topics Concern  . Not on file  Social History Narrative   Original from [place]   Moved to the [place] 2000   Widow    She lives with son.  She had four sons.          FAMILY HISTORY: Family History  Problem Relation Age of Onset  . Diabetes Mother   . Neuropathy Mother   . Neuropathy Sister   . Cancer Sister        unknown type cancer in neck   . Other Father   . Cancer Maternal Aunt        leukemia   . Colon cancer Neg Hx   . Breast cancer Neg Hx     ALLERGIES:  has No Known Allergies.  MEDICATIONS:  Current Outpatient Medications  Medication Sig Dispense Refill  . b complex vitamins tablet Take 1 tablet by mouth daily.    Marland Kitchen FLUoxetine (PROZAC) 40 MG capsule Take 40 mg by mouth daily.    . ranitidine (ZANTAC) 150 MG tablet Take 150 mg by mouth 2 (two) times daily.    . traZODone (DESYREL) 100 MG tablet  Take 100 mg by mouth at bedtime.    . gabapentin (NEURONTIN) 800 MG tablet Take 200 mg by mouth 4 (four) times daily as needed.      No current facility-administered medications for this visit.     REVIEW OF  SYSTEMS:   Constitutional: Denies fevers, chills or abnormal night sweats Eyes: Denies blurriness of vision, double vision or watery eyes Ears, nose, mouth, throat, and face: Denies mucositis or sore throat Respiratory: Denies cough, dyspnea or wheezes Cardiovascular: Denies palpitation, chest discomfort or lower extremity swelling Gastrointestinal:  Denies nausea, heartburn or change in bowel habits MSK: (+) Right axillary pain when lifting and mild limited ROM (+) arthritis, especially in B/l knees Skin: Denies abnormal skin rashes Lymphatics: Denies new lymphadenopathy or easy bruising Neurological:Denies numbness, tingling or new weaknesses Behavioral/Psych: Mood is stable, no new changes  All other systems were reviewed with the patient and are negative.  PHYSICAL EXAMINATION:  ECOG PERFORMANCE STATUS: 1 - Symptomatic but completely ambulatory  Vitals:   05/03/18 0836  BP: 120/90  Pulse: 70  Resp: 17  Temp: 98.3 F (36.8 C)  SpO2: 100%   Filed Weights   05/03/18 0836  Weight: 185 lb 14.4 oz (84.3 kg)     GENERAL:alert, no distress and comfortable SKIN: skin color, texture, turgor are normal, no rashes or significant lesions EYES: normal, conjunctiva are pink and non-injected, sclera clear OROPHARYNX:no exudate, no erythema and lips, buccal mucosa, and tongue normal  NECK: supple, thyroid normal size, non-tender, without nodularity LYMPH:  no palpable lymphadenopathy in the cervical, axillary or inguinal LUNGS: clear to auscultation and percussion with normal breathing effort HEART: regular rate & rhythm and no murmurs and no lower extremity edema ABDOMEN:abdomen soft, non-tender and normal bowel sounds Musculoskeletal:no cyanosis of digits and no clubbing  PSYCH: alert & oriented x 3 with fluent speech NEURO: no focal motor/sensory deficits Breast: (+) s/p right breast lumpectomy and right axillary re-excision: her breast incision has healed well and right axillary  incision is healing well with draining tube in place, no discharge or skin erythema. Moderate scar tissue at incision site, no other palpable mass or adenopathy.   LABORATORY DATA:  I have reviewed the data as listed CBC Latest Ref Rng & Units 03/07/2018 02/28/2018 08/08/2017  WBC 4.0 - 10.5 K/uL 6.3 4.1 5.0  Hemoglobin 12.0 - 15.0 g/dL 10.3(L) 12.0 12.8  Hematocrit 36.0 - 46.0 % 30.9(L) 36.5 38.8  Platelets 150 - 400 K/uL 156 220 235    CMP Latest Ref Rng & Units 04/12/2018 02/28/2018 05/01/2017  Glucose 70 - 140 mg/dL 95 107(H) 102(H)  BUN 7 - 26 mg/dL 12 17 26(H)  Creatinine 0.60 - 1.10 mg/dL 0.96 0.80 0.95  Sodium 136 - 145 mmol/L 141 136 139  Potassium 3.5 - 5.1 mmol/L 4.7 4.3 4.1  Chloride 98 - 109 mmol/L 106 102 102  CO2 22 - 29 mmol/L _0 Calcium 8.4 - 10.4 mg/dL 10.0 9.4 9.8  Total Protein 6.5 - 8.1 g/dL - - 7.6  Total Bilirubin 0.3 - 1.2 mg/dL - - 0.4  Alkaline Phos 38 - 126 U/L - - 77  AST 15 - 41 U/L - - 32  ALT 14 - 54 U/L - - 29    PATHOLOGY:  Surgery  Diagnosis 04/05/18  1. Breast, lumpectomy, right - INVASIVE DUCTAL CARCINOMA GRADE I/III, TWO FOCI SPANNING 2.9 CM AND 0.9 CM. - LOBULAR NEOPLASIA (LOBULAR CARCINOMA IN SITU). - DUCTAL CARCINOMA IN SITU, LOW GRADE. -  LYMPHOVASCULAR INVASION IS IDENTIFIED. - THE SURGICAL RESECTION MARGINS ARE NEGATIVE FOR DUCTAL CARCINOMA. - SEE ONCOLOGY TABLE BELOW. 2. Lymph node, sentinel, biopsy, right - THERE IS NO EVIDENCE OF CARCINOMA IN 1 OF 1 LYMPH NODE (0/1). 3. Lymph node, sentinel, biopsy, right - METASTATIC CARCINOMA IN 1 OF 1 LYMPH NODE (1/1) WITH EXTRACAPSULAR EXTENSION. - EXTRACAPSULAR EXTENSION EXTENDS 0.3 CM. 4. Lymph node, sentinel, biopsy, right - DUCTAL CARCINOMA. - PERINEURAL INVASION IS IDENTIFIED. - SEE COMMENT. 5. Lymph node, sentinel, biopsy, right 1 of 4 Amended copy Corrected FINAL for Roman, Amy A ([id number]) Diagnosis(continued) - METASTATIC CARCINOMA IN 1 OF 1 LYMPH NODE (1/1),  WITH EXTRACAPSULAR EXTENSION. - EXTRACAPSULAR EXTENSION EXTENDS 0.7 CM. 6. Lymph node, sentinel, biopsy, right - METASTATIC CARCINOMA IN 1 OF 1 LYMPH NODE (1/1), WITH EXTRACAPSULAR EXTENSION. - EXTRACAPSULAR EXTENSION EXTENDS 0.2 CM. Microscopic Comment 1. INVASIVE CARCINOMA OF THE BREAST: Resection Procedure: Lumpectomy. Specimen Laterality: Right. Tumor Size: 2.9 cm and 0.9 cm (gross measurements). Histologic Type: Ductal. Histologic Grade: I. Glandular (Acinar)/Tubular Differentiation: 3. Nuclear Pleomorphism: 1. Mitotic Rate: 1. Overall Grade: I/III. Ductal Carcinoma In Situ: Present, low grade. Tumor Extension: Confined to breast parenchyma. Margins: Distance from closest margin (millimeters): Greater than 0.2 cm to all margins. DCIS Margins: Distance from closest margin (millimeters): Greater than 0.2 cm to all margins. Regional Lymph Nodes: Number of Lymph Nodes Examined: 4. Number of Sentinel Nodes Examined (if applicable): 4. Number of Lymph Nodes with Macrometastases (>2 mm): 3. Number of Lymph Nodes with Micrometastases: 0. Number of Lymph Nodes with Isolated Tumor Cells (?0.2 mm or ?200 cells)#: 0. Size of Largest Metastatic Deposit (millimeters): 9 mm. Extranodal Extension: Present, extensive. Treatment Effect: No known presurgical therapy. Breast Biomarker Testing Performed on Previous Biopsy: Yes. Testing Performed on Case Number: [id number]. Estrogen Receptor: 95%, strong. Progesterone Receptor: 80%, strong. HER2: No amplification was detected. The ratio was 1.80. Ki-67: 2%. Representative tumor block: 1D. Pathologic Stage Classification (pTNM, AJCC 8th Edition): mpT2, pN1c. (v4.2.0.0) 4. Lymph nodal tissue is not definitively identified in part 4.    Diagnosis, 03/06/2018 Uterus, ovaries and fallopian tubes, cervix CERVIX: - NABOTHIAN CYSTS AND SQUAMOUS METAPLASIA. - NO DYSPLASIA OR MALIGNANCY. ENDOMETRIUM: - INACTIVE ENDOMETRIUM. - NO  HYPERPLASIA OR MALIGNANCY. - SEE MICROSCOPIC DESCRIPTION. MYOMETRIUM: - UNREMARKABLE. - NO EVIDENCE OF MALIGNANCY. UTERINE SEROSA: - UNREMARKABLE. - NO ENDOMETRIOSIS OR MALIGNANCY. RIGHT AND LEFT OVARIES: - UNREMARKABLE. - NO ENDOMETRIOSIS OR MALIGNANCY. RIGHT AND LEFT FALLOPIAN TUBES: - BENIGN PARATUBAL CYST. - NO ENDOMETRIOSIS OR MALIGNANCY.  Diagnosis, 02/16/2018 1. Breast, right, needle core biopsy, 8:30 o'clock - INVASIVE DUCTAL CARCINOMA, SEE COMMENT. 2. Breast, right, needle core biopsy, 9 o'clock - INVASIVE DUCTAL CARCINOMA. - LOBULAR NEOPLASIA (ATYPICAL LOBULAR HYPERPLASIA).         RADIOGRAPHIC STUDIES: I have personally reviewed the radiological images as listed and agreed with the findings in the report. Ct Chest W Contrast  Result Date: 04/16/2018 CLINICAL DATA:  New diagnosis of right breast cancer. Right breast lumpectomy with sentinel node biopsy 04/05/2018. Staging evaluation. TAHBSO 03/06/2018. EXAM: CT CHEST, ABDOMEN, AND PELVIS WITH CONTRAST TECHNIQUE: Multidetector CT imaging of the chest, abdomen and pelvis was performed following the standard protocol during bolus administration of intravenous contrast. CONTRAST:  158m ISOVUE-300 IOPAMIDOL (ISOVUE-300) INJECTION 61% COMPARISON:  None. FINDINGS: CT CHEST FINDINGS Cardiovascular: Normal heart size. No significant pericardial effusion/thickening. Great vessels are normal in course and caliber. No central pulmonary emboli. Mediastinum/Nodes: No discrete thyroid nodules. Unremarkable esophagus. There is a thin-walled 7.2 x 4.7 cm  right axillary fluid collection with surrounding surgical clips and minimal surrounding subcutaneous emphysema (series 3/image 17), probably a postsurgical seroma. No pathologically enlarged axillary Lungs/Pleura: No pneumothorax. No pleural effusion. No acute consolidative airspace disease or lung masses. Solid 4 mm medial posterior right lower lobe pulmonary nodule (series 7/image 73). No  additional significant pulmonary nodules. Musculoskeletal: No aggressive appearing focal osseous lesions. Mild thoracic spondylosis. Multiple surgical clips in the right breast with associated scattered soft tissue gas and asymmetric skin thickening in the right breast. Thin-walled right breast fluid collection measuring 8.2 x 5.2 cm (series 3/image 30). CT ABDOMEN PELVIS FINDINGS Hepatobiliary: Normal liver with no liver mass. Normal gallbladder with no radiopaque cholelithiasis. No biliary ductal dilatation. Pancreas: Normal, with no mass or duct dilation. Spleen: Normal size. No mass. Adrenals/Urinary Tract: Normal adrenals. Normal kidneys with no hydronephrosis and no renal mass. Normal bladder. Stomach/Bowel: Normal non-distended stomach. Normal caliber small bowel with no small bowel wall thickening. Normal appendix. Oral contrast transits to the right colon. Moderate stool throughout the large bowel. No large bowel wall thickening or significant pericolonic fat stranding. Vascular/Lymphatic: Normal caliber abdominal aorta. Patent portal, splenic, hepatic and renal veins. No pathologically enlarged lymph nodes in the abdomen or pelvis. Reproductive: Status post hysterectomy, with no abnormal findings at the vaginal cuff. No adnexal mass. Other: No pneumoperitoneum, ascites or focal fluid collection. Small fat containing umbilical hernia. Musculoskeletal: No aggressive appearing focal osseous lesions. Moderate lower lumbar spondylosis. Prominent degenerative changes in the sacroiliac joints. IMPRESSION: 1. No definite findings of metastatic disease in the chest. Solitary solid 4 mm right lower lobe pulmonary nodule, for which initial chest CT follow-up is advised in 3 months. 2. Postsurgical changes from right breast lumpectomy and right axillary node dissection. Thin-walled fluid collections in the right breast and right axilla are most compatible with postsurgical seromas. 3. No evidence of metastatic disease  in the abdomen or pelvis. These results will be called to the ordering clinician or representative by the Radiology Department at the imaging location. Electronically Signed   By: Ilona Sorrel M.D.   On: 04/16/2018 17:47   Nm Bone Scan Whole Body  Result Date: 04/13/2018 CLINICAL DATA:  RIGHT breast cancer, initial staging EXAM: NUCLEAR MEDICINE WHOLE BODY BONE SCAN TECHNIQUE: Whole body anterior and posterior images were obtained approximately 3 hours after intravenous injection of radiopharmaceutical. RADIOPHARMACEUTICALS:  21.2 mCi Technetium-12mMDP IV COMPARISON:  None Radiographic correlation: Knee radiographs 02/26/2018 FINDINGS: Mild uptake at the shoulders, knees and feet typically degenerative. No foci of abnormal osseous tracer accumulation are identified which are suspicious for osseous metastatic disease. Uptake in the breasts bilaterally much greater on RIGHT, nonspecific but on RIGHT is likely related to recent surgery. Expected urinary tract distribution of tracer. IMPRESSION: No scintigraphic evidence of osseous metastatic disease. Electronically Signed   By: MLavonia DanaM.D.   On: 04/13/2018 14:17   Ct Abdomen Pelvis W Contrast  Result Date: 04/16/2018 CLINICAL DATA:  New diagnosis of right breast cancer. Right breast lumpectomy with sentinel node biopsy 04/05/2018. Staging evaluation. TAHBSO 03/06/2018. EXAM: CT CHEST, ABDOMEN, AND PELVIS WITH CONTRAST TECHNIQUE: Multidetector CT imaging of the chest, abdomen and pelvis was performed following the standard protocol during bolus administration of intravenous contrast. CONTRAST:  1073mISOVUE-300 IOPAMIDOL (ISOVUE-300) INJECTION 61% COMPARISON:  None. FINDINGS: CT CHEST FINDINGS Cardiovascular: Normal heart size. No significant pericardial effusion/thickening. Great vessels are normal in course and caliber. No central pulmonary emboli. Mediastinum/Nodes: No discrete thyroid nodules. Unremarkable esophagus. There is a  thin-walled 7.2 x 4.7 cm  right axillary fluid collection with surrounding surgical clips and minimal surrounding subcutaneous emphysema (series 3/image 17), probably a postsurgical seroma. No pathologically enlarged axillary Lungs/Pleura: No pneumothorax. No pleural effusion. No acute consolidative airspace disease or lung masses. Solid 4 mm medial posterior right lower lobe pulmonary nodule (series 7/image 73). No additional significant pulmonary nodules. Musculoskeletal: No aggressive appearing focal osseous lesions. Mild thoracic spondylosis. Multiple surgical clips in the right breast with associated scattered soft tissue gas and asymmetric skin thickening in the right breast. Thin-walled right breast fluid collection measuring 8.2 x 5.2 cm (series 3/image 30). CT ABDOMEN PELVIS FINDINGS Hepatobiliary: Normal liver with no liver mass. Normal gallbladder with no radiopaque cholelithiasis. No biliary ductal dilatation. Pancreas: Normal, with no mass or duct dilation. Spleen: Normal size. No mass. Adrenals/Urinary Tract: Normal adrenals. Normal kidneys with no hydronephrosis and no renal mass. Normal bladder. Stomach/Bowel: Normal non-distended stomach. Normal caliber small bowel with no small bowel wall thickening. Normal appendix. Oral contrast transits to the right colon. Moderate stool throughout the large bowel. No large bowel wall thickening or significant pericolonic fat stranding. Vascular/Lymphatic: Normal caliber abdominal aorta. Patent portal, splenic, hepatic and renal veins. No pathologically enlarged lymph nodes in the abdomen or pelvis. Reproductive: Status post hysterectomy, with no abnormal findings at the vaginal cuff. No adnexal mass. Other: No pneumoperitoneum, ascites or focal fluid collection. Small fat containing umbilical hernia. Musculoskeletal: No aggressive appearing focal osseous lesions. Moderate lower lumbar spondylosis. Prominent degenerative changes in the sacroiliac joints. IMPRESSION: 1. No definite  findings of metastatic disease in the chest. Solitary solid 4 mm right lower lobe pulmonary nodule, for which initial chest CT follow-up is advised in 3 months. 2. Postsurgical changes from right breast lumpectomy and right axillary node dissection. Thin-walled fluid collections in the right breast and right axilla are most compatible with postsurgical seromas. 3. No evidence of metastatic disease in the abdomen or pelvis. These results will be called to the ordering clinician or representative by the Radiology Department at the imaging location. Electronically Signed   By: Ilona Sorrel M.D.   On: 04/16/2018 17:47   Nm Sentinel Node Inj-no Rpt (breast)  Result Date: 04/05/2018 Sulfur colloid was injected by the nuclear medicine technologist for melanoma sentinel node.   Mm Breast Surgical Specimen  Result Date: 04/05/2018 CLINICAL DATA:  Patient with recently diagnosed cancer at 2 nearby sites within the RIGHT breast, status post lumpectomy today after earlier radioactive seed localizations. EXAM: SPECIMEN RADIOGRAPH OF THE RIGHT BREAST COMPARISON:  Previous exam(s). FINDINGS: Status post excision of the right breast. The 2 radioactive seeds and 2 biopsy marker clips are present, completely intact, and were marked for pathology. The positions of the radioactive seeds and biopsy marker clips within the specimen were discussed with the OR staff during the procedure. IMPRESSION: Specimen radiograph of the right breast. Electronically Signed   By: Franki Cabot M.D.   On: 04/05/2018 12:27   Mm Rt Radioactive Seed Loc Mammo Guide  Result Date: 04/03/2018 CLINICAL DATA:  Preoperative radioactive seed localization, prior to right breast lumpectomy for biopsy-proven malignancies at 8:30 o'clock and 9 o'clock. EXAM: MAMMOGRAPHIC GUIDED RADIOACTIVE SEED LOCALIZATION OF THE RIGHT BREAST COMPARISON:  Previous exam(s). FINDINGS: Patient presents for radioactive seed localization prior to right breast lumpectomy. I  met with the patient and we discussed the procedure of seed localization including benefits and alternatives. We discussed the high likelihood of a successful procedure. We discussed the risks of the procedure  including infection, bleeding, tissue injury and further surgery. We discussed the low dose of radioactivity involved in the procedure. Informed, written consent was given. The usual time-out protocol was performed immediately prior to the procedure. Using mammographic guidance, sterile technique, 1% lidocaine and an I-125 radioactive seed, right breast 8:30 o'clock mass, associated with ribbon shaped marker was localized using a lateral approach. The follow-up mammogram images demonstrate the seed to be located 5 mm posterior and superior to the post biopsy marker. Follow-up survey of the patient confirms presence of the radioactive seed. Order number of I-125 seed:  [id number]. Total activity:  8.811 millicurie reference Date: March 29, 2018 Next, using mammographic guidance, sterile technique, 1% lidocaine and an I-125 radioactive seed, right breast 9 o'clock mass, associated with coil shaped marker was localized using a lateral approach. The follow-up mammogram images confirm the seed in the expected location and were marked for Dr. Barry Dienes. Follow-up survey of the patient confirms presence of the radioactive seed. Order number of I-125 seed:  [id number]. Total activity:  9.292 millicurie reference Date: Mar 13, 2018 The patient tolerated the procedure well and was released from the [place]. She was given instructions regarding seed removal. IMPRESSION: Radioactive seed localization of 2 right breast masses. No apparent complications. Radioactive seed at the 8:30 o'clock position is located 5 mm posterior and superior to the ribbon shaped post biopsy marker/center of the mass. Radioactive seed at the 9 o'clock position is located immediately adjacent to the coil shaped post biopsy marker/mass. These  results were called by telephone at the time of interpretation on 04/03/2018 at 2:16 pm to Dr. Stark Klein , who verbally acknowledged these results. Electronically Signed   By: Fidela Salisbury M.D.   On: 04/03/2018 14:16   Mm Rt Radio Seed Ea Add Lesion Loc Mammo  Result Date: 04/03/2018 CLINICAL DATA:  Preoperative radioactive seed localization, prior to right breast lumpectomy for biopsy-proven malignancies at 8:30 o'clock and 9 o'clock. EXAM: MAMMOGRAPHIC GUIDED RADIOACTIVE SEED LOCALIZATION OF THE RIGHT BREAST COMPARISON:  Previous exam(s). FINDINGS: Patient presents for radioactive seed localization prior to right breast lumpectomy. I met with the patient and we discussed the procedure of seed localization including benefits and alternatives. We discussed the high likelihood of a successful procedure. We discussed the risks of the procedure including infection, bleeding, tissue injury and further surgery. We discussed the low dose of radioactivity involved in the procedure. Informed, written consent was given. The usual time-out protocol was performed immediately prior to the procedure. Using mammographic guidance, sterile technique, 1% lidocaine and an I-125 radioactive seed, right breast 8:30 o'clock mass, associated with ribbon shaped marker was localized using a lateral approach. The follow-up mammogram images demonstrate the seed to be located 5 mm posterior and superior to the post biopsy marker. Follow-up survey of the patient confirms presence of the radioactive seed. Order number of I-125 seed:  [id number]. Total activity:  7.711 millicurie reference Date: March 29, 2018 Next, using mammographic guidance, sterile technique, 1% lidocaine and an I-125 radioactive seed, right breast 9 o'clock mass, associated with coil shaped marker was localized using a lateral approach. The follow-up mammogram images confirm the seed in the expected location and were marked for Dr. Barry Dienes. Follow-up survey of the  patient confirms presence of the radioactive seed. Order number of I-125 seed:  [id number]. Total activity:  3.832 millicurie reference Date: Mar 13, 2018 The patient tolerated the procedure well and was released from the [place]. She was given instructions regarding seed  removal. IMPRESSION: Radioactive seed localization of 2 right breast masses. No apparent complications. Radioactive seed at the 8:30 o'clock position is located 5 mm posterior and superior to the ribbon shaped post biopsy marker/center of the mass. Radioactive seed at the 9 o'clock position is located immediately adjacent to the coil shaped post biopsy marker/mass. These results were called by telephone at the time of interpretation on 04/03/2018 at 2:16 pm to Dr. Stark Klein , who verbally acknowledged these results. Electronically Signed   By: Fidela Salisbury M.D.   On: 04/03/2018 14:16    ASSESSMENT & PLAN:  Gionni Freese Roman is a 67 y.o. African female with a history of Arthritis and neuropathy  1.  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 Breast, right, needle core biopsy, 9 o'clock with invasive ductal carcinoma. Lobular neoplasia (atypical lobular hyperplasia). Prognostic indicators significant for: ER, 95% positive with strong staining intensity and PR, 80% positive, with moderate staining intensity. Proliferation marker Ki67 at 2%. HER2 negative. -Due to the size of her 2 tumors, mastectomy was recommended, however patient strongly preferred lumpectomy and She underwent right breast lumpectomy by Dr. Barry Dienes on 04/05/18.  -I previously discussed her pathology results which showed her known 2 breast tumors with negative margins, 3/4 positive lymph nodes,  and 1 soft tissue deposit of cancer in the axilla.  -I previously discussed given her multiple positive lymph nodes, she would need axillary lymph node dissection to remove all lymph nodes, no further surgery of her breast is needed.  -Her staging CT CAP and bone scan from 03/2018 were negative for distant metastasis.  -She underwent R ALND on 04/17/18. Pathology was benign with no evidence of malignancy. She is cured at this point. I discussed there is still a 5-10% risk for recurrence likely in later years.  -She has clinical high risk disease, but her Mammoprint showed low risk luminal type A. Based on the test result, there is no benefit from adjuvant chemotherapy. I do not recommend chemotherapy.  -I suggest PT to help prevent lymphedema since she has undergone two surgeries.  -Given her positive lymph nodes she will proceed with radiation to further reduce her risk of local recurrence. I will refer her back to Dr. Isidore Moos.  -Giving the strong ER and PR expression in her postmenopausal status, I recommend adjuvant endocrine therapy with aromatase inhibitor for a total of 5-10 years to reduce the risk of distant cancer recurrence. She is interested. -I encouraged her to maintain a healthy lifestyle with exercise and healthy diet. She is fine to continue Tumeric use.  -We also discussed the breast cancer surveillance after her surgery. She will continue annual screening mammogram, self exam, and a routine office visit with lab and exam with [place]. -F/u in 8 weeks   2. Lung nodules -Found on 04/16/18 CT scan  -I do not suspect this is related to her cancer but will monitor with a repeat scan at the end of the year.    3. Bone Health  -I discussed after radiation she will proceed with AI which can effect her bone density. I recommend a DEXA scan to get a baseline. She is agreeable. DEXA to be done within the next 2 months    4. Osteoarthritis -She had significant arthritis in her b/l knees -  She  notes being offered knee replacement surgery by her orthopedist but she declined.  -I discussed AI use may effect her joint pain. Will monitor while on medication.    5. Anxiety, depression -Patient does take anti-depressants  -Stable    Plan:  -Based on the mammaprint, I do not recommend adjuvant chemotherapy  -lab and f/u in 8 weeks  -Rad/onc f/u with Dr. Isidore Moos for adjuvant radiation -DEXA at [facility] in 1-2 months     All questions were answered. The patient knows to call the clinic with any problems, questions or concerns. I spent 30 minutes counseling the patient face to face. The total time spent in the appointment was 35 minutes and more than 50% was on counseling.  Oneal Deputy, am acting as scribe for Truitt Merle, MD.   I have reviewed the above documentation for accuracy and completeness, and I agree with the above.      Truitt Merle, MD 05/03/2018 10:16 AM

## 2018-05-03 ENCOUNTER — Encounter: Payer: Self-pay | Admitting: Hematology

## 2018-05-03 ENCOUNTER — Inpatient Hospital Stay: Payer: Medicare Other | Attending: Nurse Practitioner | Admitting: Hematology

## 2018-05-03 VITALS — BP 120/90 | HR 70 | Temp 98.3°F | Resp 17 | Ht 61.0 in | Wt 185.9 lb

## 2018-05-03 DIAGNOSIS — F329 Major depressive disorder, single episode, unspecified: Secondary | ICD-10-CM | POA: Insufficient documentation

## 2018-05-03 DIAGNOSIS — F419 Anxiety disorder, unspecified: Secondary | ICD-10-CM | POA: Insufficient documentation

## 2018-05-03 DIAGNOSIS — Z170 Estrogen receptor positive status [ER+]: Secondary | ICD-10-CM | POA: Diagnosis not present

## 2018-05-03 DIAGNOSIS — C50511 Malignant neoplasm of lower-outer quadrant of right female breast: Secondary | ICD-10-CM | POA: Insufficient documentation

## 2018-05-03 DIAGNOSIS — M170 Bilateral primary osteoarthritis of knee: Secondary | ICD-10-CM | POA: Diagnosis not present

## 2018-05-03 DIAGNOSIS — R918 Other nonspecific abnormal finding of lung field: Secondary | ICD-10-CM | POA: Insufficient documentation

## 2018-05-03 DIAGNOSIS — E2839 Other primary ovarian failure: Secondary | ICD-10-CM

## 2018-05-04 ENCOUNTER — Telehealth: Payer: Self-pay | Admitting: Hematology

## 2018-05-04 NOTE — Telephone Encounter (Signed)
LMVM for patient regarding appointments / letter/calendar also mailed per 7/11 los

## 2018-05-07 NOTE — Progress Notes (Signed)
Location of Breast Cancer: Right Breast  Histology per Pathology Report:  02/16/18 Diagnosis 1. Breast, right, needle core biopsy, 8:30 o'clock - INVASIVE DUCTAL CARCINOMA, SEE COMMENT. 2. Breast, right, needle core biopsy, 9 o'clock - INVASIVE DUCTAL CARCINOMA. - LOBULAR NEOPLASIA (ATYPICAL LOBULAR HYPERPLASIA  Receptor Status: ER(100%), PR (80%), Her2-neu (NEG), Ki-(2%)  04/05/18 Diagnosis 1. Breast, lumpectomy, right - INVASIVE DUCTAL CARCINOMA GRADE I/III, TWO FOCI SPANNING 2.9 CM AND 0.9 CM. - LOBULAR NEOPLASIA (LOBULAR CARCINOMA IN SITU). - DUCTAL CARCINOMA IN SITU, LOW GRADE. - LYMPHOVASCULAR INVASION IS IDENTIFIED. - THE SURGICAL RESECTION MARGINS ARE NEGATIVE FOR DUCTAL CARCINOMA. - SEE ONCOLOGY TABLE BELOW. 2. Lymph node, sentinel, biopsy, right - THERE IS NO EVIDENCE OF CARCINOMA IN 1 OF 1 LYMPH NODE (0/1). 3. Lymph node, sentinel, biopsy, right - METASTATIC CARCINOMA IN 1 OF 1 LYMPH NODE (1/1) WITH EXTRACAPSULAR EXTENSION. - EXTRACAPSULAR EXTENSION EXTENDS 0.3 CM. 4. Lymph node, sentinel, biopsy, right - DUCTAL CARCINOMA. - PERINEURAL INVASION IS IDENTIFIED. - SEE COMMENT. 5. Lymph node, sentinel, biopsy, right - METASTATIC CARCINOMA IN 1 OF 1 LYMPH NODE (1/1), WITH EXTRACAPSULAR EXTENSION. - EXTRACAPSULAR EXTENSION EXTENDS 0.7 CM. 6. Lymph node, sentinel, biopsy, right - METASTATIC CARCINOMA IN 1 OF 1 LYMPH NODE (1/1), WITH EXTRACAPSULAR EXTENSION. - EXTRACAPSULAR EXTENSION EXTENDS 0.2 CM.  04/17/18 Diagnosis Lymph nodes, regional resection, Right Axillary - BENIGN FIBROADIPOSE AND BREAST TISSUE WITH FAT NECROSIS. - NO LYMPHOID TISSUE PRESENT. - NO MALIGNANCY IDENTIFIED.   Did patient present with symptoms or was this found on screening mammography?: She had a screening mammogram which demonstrated possible distortion close to a previous excisional biopsy site to the upper quadrant of her right breast.   Past/Anticipated interventions by surgeon, if  any: Dr. Barry Dienes 04/05/18 Right Breast Radioactive seed bracketed lumpectomy and sentinel lymph node biopsy  04/17/18 PROCEDURE:  Procedure(s): Right axillary lymph node dissection.   SURGEON:  Surgeon(s): Stark Klein, MD  She continues to have a drain in place. She has >25 cc's removed from it daily. She is being monitored by a nurse. She will present to Dr. Marlowe Aschoff office when the drainage is <25 per instructions.    Past/Anticipated interventions by medical oncology, if any:  05/03/18 Dr. Burr Medico  Plan:  -Based on the mammaprint, I do not recommend adjuvant chemotherapy  -lab and f/u in 8 weeks  -Rad/onc f/u with Dr. Isidore Moos for adjuvant radiation -DEXA at [facility] in 1-2 months    Lymphedema issues, if any:  She denies. She reports good arm mobility.   Pain issues, if any:  She reports pain to the upper part of her arm related to surgery.   SAFETY ISSUES:  Prior radiation? No  Pacemaker/ICD? No  Possible current pregnancy? No  Is the patient on methotrexate? No  Current Complaints / other details:    BP (!) 122/59 (BP Location: Left Arm, Patient Position: Sitting, Cuff Size: Normal)   Pulse 72   Temp 98.4 F (36.9 C)   Resp 16   Wt 186 lb 3.2 oz (84.5 kg)   SpO2 100%   BMI 35.18 kg/m    Wt Readings from Last 3 Encounters:  05/08/18 186 lb 3.2 oz (84.5 kg)  05/03/18 185 lb 14.4 oz (84.3 kg)  04/23/18 187 lb (84.8 kg)

## 2018-05-08 ENCOUNTER — Ambulatory Visit
Admission: RE | Admit: 2018-05-08 | Discharge: 2018-05-08 | Disposition: A | Discharge status: Home / Self Care | Payer: Medicare Other | Source: Ambulatory Visit | Attending: Radiation Oncology | Admitting: Radiation Oncology

## 2018-05-08 ENCOUNTER — Other Ambulatory Visit: Payer: Self-pay

## 2018-05-08 ENCOUNTER — Encounter: Payer: Self-pay | Admitting: Radiation Oncology

## 2018-05-08 VITALS — BP 122/59 | HR 72 | Temp 98.4°F | Resp 16 | Wt 186.2 lb

## 2018-05-08 DIAGNOSIS — C773 Secondary and unspecified malignant neoplasm of axilla and upper limb lymph nodes: Secondary | ICD-10-CM | POA: Diagnosis not present

## 2018-05-08 DIAGNOSIS — C50511 Malignant neoplasm of lower-outer quadrant of right female breast: Secondary | ICD-10-CM | POA: Diagnosis not present

## 2018-05-08 DIAGNOSIS — Z170 Estrogen receptor positive status [ER+]: Principal | ICD-10-CM

## 2018-05-08 DIAGNOSIS — Z9889 Other specified postprocedural states: Secondary | ICD-10-CM | POA: Diagnosis not present

## 2018-05-08 DIAGNOSIS — Z79899 Other long term (current) drug therapy: Secondary | ICD-10-CM | POA: Insufficient documentation

## 2018-05-08 NOTE — Progress Notes (Signed)
Radiation Oncology         [phone] ________________________________  Name: NOELE ICENHOUR MRN: [id number]  Date: 05/08/2018  DOB: 1951-07-12  Follow-Up Visit Note  Outpatient  CC: Lin Landsman, MD  Truitt Merle, MD  Diagnosis:      ICD-10-CM   1. Malignant neoplasm of 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, estrogen receptor positive ([place]) C50.511    Z17.0     Stage IB Right Breast LOQ Invasive Ductal Carcinoma, ER(+) / PR(+) / Her2(-), Grade 2 Cancer Staging Malignant neoplasm of 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, estrogen receptor positive ([employer]) Staging form: Breast, AJCC 8th Edition - Clinical stage from 02/16/2018: Stage IB (cT2(m), cN0, cM0, G2, ER+, PR+, HER2-) - Signed by Truitt Merle, MD on 03/11/2018 - Pathologic: Stage IA (pT2, pN1a, cM0, G1, ER+, PR+, HER2-) - Signed by Truitt Merle, MD on 04/12/2018    CHIEF COMPLAINT: Here to discuss management of right breast cancer  Narrative:  The patient returns today for follow-up.  She was originally seen on 03/28/2018.      On 04/05/2018 she underwent right breast lumpectomy showing invasive ductal carcinoma grade I/III, two foci spanning 2.9 cm and 0.9cm. Lobular neoplasia (lobular carcinoma IN SITU). Ductal carcinoma IN SITU, low grade. Lymphovascular invasion is identified. The surgical  Resection margins are negative for ductal carcinoma. Three of 4 lymph nodes show metastatic carcinoma with extracapsular extension.  Estrogen Receptor: 95%, strong. Progesterone Receptor: 80%, strong. HER2: negative. The ratio was 1.80. Ki-67: 2%. mpT2, pN1c.  She had a right axillary regional resection on 04/17/2018 showing benign fibroadipose and breast tissue with fat necrosis. No lymphoid tissue present. No malignancy identified.   Her mamma print results were low risk, so she will not be seeing chemotherapy. She is being followed by Dr. Burr Medico in Medical Oncology, with her most recent visit on 05/03/2018.  Myrissa is doing  well today. She reports good arm mobility and pain to the upper part of her arm related to surgery.          ALLERGIES:  has No Known Allergies.  Meds: Current Outpatient Medications  Medication Sig Dispense Refill  . b complex vitamins tablet Take 1 tablet by mouth daily.    Marland Kitchen FLUoxetine (PROZAC) 40 MG capsule Take 40 mg by mouth daily.    . traZODone (DESYREL) 100 MG tablet Take 100 mg by mouth at bedtime.    . gabapentin (NEURONTIN) 800 MG tablet Take 200 mg by mouth 4 (four) times daily as needed.     . ranitidine (ZANTAC) 150 MG tablet Take 150 mg by mouth 2 (two) times daily.     No current facility-administered medications for this encounter.     Physical Findings:  weight is 186 lb 3.2 oz (84.5 kg). Her temperature is 98.4 F (36.9 C). Her blood pressure is 122/59 (abnormal) and her pulse is 72. Her respiration is 16 and oxygen saturation is 100%. .     Breast exam reveals the lumpectomy scar/ axillary scar is healing well in the right UOQ. She still has a JP drain in place. No lymphedema in the upper extremities.   Lab Findings: Lab Results  Component Value Date   WBC 6.3 03/07/2018   HGB 10.3 (L) 03/07/2018   HCT 30.9 (L) 03/07/2018   MCV 88.0 03/07/2018   PLT 156 03/07/2018    _0 @  Radiographic Findings: I have personally reviewed her imaging. Ct Chest W Contrast  Result Date: 04/16/2018 CLINICAL DATA:  New diagnosis of right breast cancer. Right breast lumpectomy with sentinel node biopsy 04/05/2018. Staging evaluation. TAHBSO 03/06/2018. EXAM: CT CHEST, ABDOMEN, AND PELVIS WITH CONTRAST TECHNIQUE: Multidetector CT imaging of the chest, abdomen and pelvis was performed following the standard protocol during bolus administration of intravenous contrast. CONTRAST:  158m ISOVUE-300 IOPAMIDOL (ISOVUE-300) INJECTION 61% COMPARISON:  None. FINDINGS: CT CHEST FINDINGS Cardiovascular: Normal heart size. No significant pericardial effusion/thickening. Great  vessels are normal in course and caliber. No central pulmonary emboli. Mediastinum/Nodes: No discrete thyroid nodules. Unremarkable esophagus. There is a thin-walled 7.2 x 4.7 cm right axillary fluid collection with surrounding surgical clips and minimal surrounding subcutaneous emphysema (series 3/image 17), probably a postsurgical seroma. No pathologically enlarged axillary Lungs/Pleura: No pneumothorax. No pleural effusion. No acute consolidative airspace disease or lung masses. Solid 4 mm medial posterior right lower lobe pulmonary nodule (series 7/image 73). No additional significant pulmonary nodules. Musculoskeletal: No aggressive appearing focal osseous lesions. Mild thoracic spondylosis. Multiple surgical clips in the right breast with associated scattered soft tissue gas and asymmetric skin thickening in the right breast. Thin-walled right breast fluid collection measuring 8.2 x 5.2 cm (series 3/image 30). CT ABDOMEN PELVIS FINDINGS Hepatobiliary: Normal liver with no liver mass. Normal gallbladder with no radiopaque cholelithiasis. No biliary ductal dilatation. Pancreas: Normal, with no mass or duct dilation. Spleen: Normal size. No mass. Adrenals/Urinary Tract: Normal adrenals. Normal kidneys with no hydronephrosis and no renal mass. Normal bladder. Stomach/Bowel: Normal non-distended stomach. Normal caliber small bowel with no small bowel wall thickening. Normal appendix. Oral contrast transits to the right colon. Moderate stool throughout the large bowel. No large bowel wall thickening or significant pericolonic fat stranding. Vascular/Lymphatic: Normal caliber abdominal aorta. Patent portal, splenic, hepatic and renal veins. No pathologically enlarged lymph nodes in the abdomen or pelvis. Reproductive: Status post hysterectomy, with no abnormal findings at the vaginal cuff. No adnexal mass. Other: No pneumoperitoneum, ascites or focal fluid collection. Small fat containing umbilical hernia.  Musculoskeletal: No aggressive appearing focal osseous lesions. Moderate lower lumbar spondylosis. Prominent degenerative changes in the sacroiliac joints. IMPRESSION: 1. No definite findings of metastatic disease in the chest. Solitary solid 4 mm right lower lobe pulmonary nodule, for which initial chest CT follow-up is advised in 3 months. 2. Postsurgical changes from right breast lumpectomy and right axillary node dissection. Thin-walled fluid collections in the right breast and right axilla are most compatible with postsurgical seromas. 3. No evidence of metastatic disease in the abdomen or pelvis. These results will be called to the ordering clinician or representative by the Radiology Department at the imaging location. Electronically Signed   By: JIlona SorrelM.D.   On: 04/16/2018 17:47   Nm Bone Scan Whole Body  Result Date: 04/13/2018 CLINICAL DATA:  RIGHT breast cancer, initial staging EXAM: NUCLEAR MEDICINE WHOLE BODY BONE SCAN TECHNIQUE: Whole body anterior and posterior images were obtained approximately 3 hours after intravenous injection of radiopharmaceutical. RADIOPHARMACEUTICALS:  21.2 mCi Technetium-946mDP IV COMPARISON:  None Radiographic correlation: Knee radiographs 02/26/2018 FINDINGS: Mild uptake at the shoulders, knees and feet typically degenerative. No foci of abnormal osseous tracer accumulation are identified which are suspicious for osseous metastatic disease. Uptake in the breasts bilaterally much greater on RIGHT, nonspecific but on RIGHT is likely related to recent surgery. Expected urinary tract distribution of tracer. IMPRESSION: No scintigraphic evidence of osseous metastatic disease. Electronically Signed   By: MaLavonia Dana.D.   On: 04/13/2018 14:17   Ct Abdomen Pelvis W Contrast  Result  Date: 04/16/2018 CLINICAL DATA:  New diagnosis of right breast cancer. Right breast lumpectomy with sentinel node biopsy 04/05/2018. Staging evaluation. TAHBSO 03/06/2018. EXAM: CT  CHEST, ABDOMEN, AND PELVIS WITH CONTRAST TECHNIQUE: Multidetector CT imaging of the chest, abdomen and pelvis was performed following the standard protocol during bolus administration of intravenous contrast. CONTRAST:  198m ISOVUE-300 IOPAMIDOL (ISOVUE-300) INJECTION 61% COMPARISON:  None. FINDINGS: CT CHEST FINDINGS Cardiovascular: Normal heart size. No significant pericardial effusion/thickening. Great vessels are normal in course and caliber. No central pulmonary emboli. Mediastinum/Nodes: No discrete thyroid nodules. Unremarkable esophagus. There is a thin-walled 7.2 x 4.7 cm right axillary fluid collection with surrounding surgical clips and minimal surrounding subcutaneous emphysema (series 3/image 17), probably a postsurgical seroma. No pathologically enlarged axillary Lungs/Pleura: No pneumothorax. No pleural effusion. No acute consolidative airspace disease or lung masses. Solid 4 mm medial posterior right lower lobe pulmonary nodule (series 7/image 73). No additional significant pulmonary nodules. Musculoskeletal: No aggressive appearing focal osseous lesions. Mild thoracic spondylosis. Multiple surgical clips in the right breast with associated scattered soft tissue gas and asymmetric skin thickening in the right breast. Thin-walled right breast fluid collection measuring 8.2 x 5.2 cm (series 3/image 30). CT ABDOMEN PELVIS FINDINGS Hepatobiliary: Normal liver with no liver mass. Normal gallbladder with no radiopaque cholelithiasis. No biliary ductal dilatation. Pancreas: Normal, with no mass or duct dilation. Spleen: Normal size. No mass. Adrenals/Urinary Tract: Normal adrenals. Normal kidneys with no hydronephrosis and no renal mass. Normal bladder. Stomach/Bowel: Normal non-distended stomach. Normal caliber small bowel with no small bowel wall thickening. Normal appendix. Oral contrast transits to the right colon. Moderate stool throughout the large bowel. No large bowel wall thickening or significant  pericolonic fat stranding. Vascular/Lymphatic: Normal caliber abdominal aorta. Patent portal, splenic, hepatic and renal veins. No pathologically enlarged lymph nodes in the abdomen or pelvis. Reproductive: Status post hysterectomy, with no abnormal findings at the vaginal cuff. No adnexal mass. Other: No pneumoperitoneum, ascites or focal fluid collection. Small fat containing umbilical hernia. Musculoskeletal: No aggressive appearing focal osseous lesions. Moderate lower lumbar spondylosis. Prominent degenerative changes in the sacroiliac joints. IMPRESSION: 1. No definite findings of metastatic disease in the chest. Solitary solid 4 mm right lower lobe pulmonary nodule, for which initial chest CT follow-up is advised in 3 months. 2. Postsurgical changes from right breast lumpectomy and right axillary node dissection. Thin-walled fluid collections in the right breast and right axilla are most compatible with postsurgical seromas. 3. No evidence of metastatic disease in the abdomen or pelvis. These results will be called to the ordering clinician or representative by the Radiology Department at the imaging location. Electronically Signed   By: JIlona SorrelM.D.   On: 04/16/2018 17:47    Impression/Plan: We discussed adjuvant radiotherapy today.  I recommend 6 weeks of radiotherapy to right breast and regional lymph nodes to reduce by loco regional recurrence by 2/3 .  The risks, benefits and side effects of this treatment were discussed in detail.  She understands that radiotherapy is associated with skin irritation and fatigue in the acute setting. Late effects can include cosmetic changes and rare injury to internal organs.   She is enthusiastic about proceeding with treatment. A consent form has been signed and placed in her chart.  A total of 5 medically necessary complex treatment devices will be fabricated and supervised by me: 4 fields with MLCs for custom blocks to protect heart, and lungs;  and, a  Vac-lok. MORE COMPLEX DEVICES MAY BE MADE IN DOSIMETRY  FOR FIELD IN FIELD BEAMS FOR DOSE HOMOGENEITY.  I have requested : 3D Simulation which is medically necessary to give adequate dose to at risk tissues while sparing lungs and heart.  I have requested a DVH of the following structures: lungs, heart, right lumpectomy cavity.    The patient will receive 50 Gy in 25 fractions to the right breast and regional lymph nodes.  This will be followed by a boost.   We will simulate her 05/21/2018 to allow more time for healing and for drain removal.   I spent 25 minutes face to face with the patient and more than 50% of that time was spent in counseling and/or coordination of care. _____________________________________   Eppie Gibson, MD   This document serves as a record of services personally performed by Eppie Gibson, MD. It was created on her behalf by Margit Banda, a trained medical scribe. The creation of this record is based on the scribe's personal observations and the provider's statements to them. This document has been checked and approved by the attending provider.

## 2018-05-11 ENCOUNTER — Encounter: Payer: Self-pay | Admitting: Radiation Oncology

## 2018-05-18 ENCOUNTER — Other Ambulatory Visit: Payer: Self-pay

## 2018-05-18 ENCOUNTER — Ambulatory Visit: Payer: Medicare Other | Attending: General Surgery | Admitting: Rehabilitation

## 2018-05-18 ENCOUNTER — Encounter: Payer: Self-pay | Admitting: Rehabilitation

## 2018-05-18 DIAGNOSIS — M25611 Stiffness of right shoulder, not elsewhere classified: Secondary | ICD-10-CM

## 2018-05-18 DIAGNOSIS — Z9189 Other specified personal risk factors, not elsewhere classified: Secondary | ICD-10-CM | POA: Insufficient documentation

## 2018-05-18 DIAGNOSIS — R293 Abnormal posture: Secondary | ICD-10-CM | POA: Diagnosis not present

## 2018-05-18 DIAGNOSIS — M79621 Pain in right upper arm: Secondary | ICD-10-CM

## 2018-05-18 NOTE — Therapy (Signed)
[place], [place], [address] Phone: [phone]   Fax:  [phone]  Physical Therapy Evaluation  Patient Details  Name: Amy Roman MRN: [id number] Date of Birth: June 26, 1951 Referring Provider: Dr. Barry Dienes   Encounter Date: 05/18/2018  PT End of Session - 05/18/18 1112    Visit Number  1    Number of Visits  4    Date for PT Re-Evaluation  06/22/18    PT Start Time  0802    PT Stop Time  0846    PT Time Calculation (min)  44 min    Activity Tolerance  Patient tolerated treatment well       Past Medical History:  Diagnosis Date  . Anxiety and depression 10/15/2014  . Arthritis    BILATERAL KNEES, HAD CORTISONE INJECTION 03/2017  . Brain cancer ([facility])   . Depression   . GERD (gastroesophageal reflux disease)    OCCASIONAL WITH CERTAIN FOOD  . Headache   . Seasonal allergies   . SVD (spontaneous vaginal delivery)    x 4  . Unspecified hereditary and idiopathic peripheral neuropathy 05/14/2014   LEFT KNEE AND FOOT    Past Surgical History:  Procedure Laterality Date  . AXILLARY LYMPH NODE DISSECTION Right 04/17/2018   Procedure: AXILLARY LYMPH NODE DISSECTION;  Surgeon: Stark Klein, MD;  Location: [facility];  Service: General;  Laterality: Right;  . BREAST EXCISIONAL BIOPSY Right   . BREAST LUMPECTOMY  2007   RIGHT BREAST  . BREAST LUMPECTOMY WITH RADIOACTIVE SEED AND SENTINEL LYMPH NODE BIOPSY Right 04/05/2018   Procedure: RIGHT BREAST SEED BRACKETED LUMPECTOMY WITH SENTINEL LYMPH NODE BIOPSY;  Surgeon: Stark Klein, MD;  Location: [facility];  Service: General;  Laterality: Right;  . BREAST SURGERY     bx, no cancer   . CATARACT EXTRACTION Bilateral   . DILATATION & CURETTAGE/HYSTEROSCOPY WITH MYOSURE N/A 08/09/2017   Procedure: DILATATION & CURETTAGE/HYSTEROSCOPY WITH MYOSURE;  Surgeon: Emily Filbert, MD;  Location: [facility];  Service: Gynecology;   Laterality: N/A;  . LABIOPLASTY  03/06/2018   Procedure: LABIAPLASTY;  Surgeon: Emily Filbert, MD;  Location: [facility];  Service: Gynecology;;  . VAGINAL HYSTERECTOMY Bilateral 03/06/2018   Procedure: HYSTERECTOMY VAGINAL WITH BILATERAL SALPINGOOPHORECTOMY;  Surgeon: Emily Filbert, MD;  Location: [facility];  Service: Gynecology;  Laterality: Bilateral;    There were no vitals filed for this visit.   Subjective Assessment - 05/18/18 0807    Subjective  Denies swelling, no daily limitations  but some pain.  Will start radiation soon.      Patient is accompained by:  Interpreter    Pertinent History  Stage IB (cT2(m), cN0, cM0, G2, ER+, PR+, HER2-); 6/13 LUMPECTOMY WITH SENTINEL LYMPH NODE BIOPSY by Dr. Barry Dienes.  3/4 positive lymph nodes, and 1 soft tissue deposit of cancer in the axilla, then ALND on 04/17/18 with negative margins, no chemotherapy needed, will start radiation soon,  hysterectomy with BSO due to vaginal bleeding intermittently occurring since October 2018 completed on 03/06/2018 , possible lipedema    Currently in Pain?  No/denies only hurts when lifting the arm in the axilla         [facility] PT Assessment - 05/18/18 0001      Assessment   Medical Diagnosis  Rt breast cancer    Referring Provider  Dr. Barry Dienes    Onset Date/Surgical Date  04/15/18    Hand Dominance  Right  Next MD Visit  unknown    Prior Therapy  no      Precautions   Precaution Comments  lymphedema, cancer      Restrictions   Weight Bearing Restrictions  No      Balance Screen   Has the patient fallen in the past 6 months  No    Has the patient had a decrease in activity level because of a fear of falling?   No    Is the patient reluctant to leave their home because of a fear of falling?   No      Home Social worker  Private residence    Living Arrangements  Children    Available Help at Discharge  Family    Additional Comments  husand died, reports being modest about son helping at home  due to modesty      Prior Function   Level of [place]  Retired    Leisure  home care; cooking Optician, dispensing   Overall Cognitive Status  Within Functional Limits for tasks assessed      Observation/Other Assessments   Observations  covered drain site Rt lateral chest wall removed 2 days ago, pt reports she can take the bandage off today.  Well healed ALND and lumpectomy scars, no edema present.  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  Reports this happened to all of her family members at the age of 73      Sensation   Additional Comments  some numbness Rt lateral trunk near drain      Posture/Postural Control   Posture/Postural Control  Postural limitations    Postural Limitations  Forward head;Rounded Shoulders;Increased thoracic kyphosis      ROM / Strength   AROM / PROM / Strength  AROM;PROM;Strength      AROM   AROM Assessment Site  Shoulder    Right/Left Shoulder  Right;Left    Right Shoulder Flexion  160 Degrees    Right Shoulder ABduction  170 Degrees    Right Shoulder Internal Rotation  70 Degrees    Right Shoulder External Rotation  100 Degrees    Left Shoulder Flexion  170 Degrees    Left Shoulder ABduction  175 Degrees    Left Shoulder Internal Rotation  70 Degrees    Left Shoulder External Rotation  100 Degrees      PROM   Overall PROM Comments  no cording evident; radiation position reported comfortable    PROM Assessment Site  Shoulder    Right/Left Shoulder  Right;Left    Left Shoulder Flexion  165 Degrees pull only    Left Shoulder ABduction  165 Degrees pull only    Left Shoulder Internal Rotation  75 Degrees    Left Shoulder External Rotation  100 Degrees        LYMPHEDEMA/ONCOLOGY QUESTIONNAIRE - 05/18/18 0817      Type   Cancer Type  Rt breast      Surgeries   Lumpectomy Date  04/05/18    Sentinel Lymph Node Biopsy Date  04/05/18 3/4 positive and axillary tissue    Axillary  Lymph Node Dissection Date  04/17/18    Other Surgery Date  -- hysterectomy       Treatment   Active Chemotherapy Treatment  No    Past Chemotherapy Treatment  No    Active Radiation Treatment  No  Past Radiation Treatment  No    Current Hormone Treatment  No    Past Hormone Therapy  No      What other symptoms do you have   Are you Having Heaviness or Tightness  No      Lymphedema Assessments   Lymphedema Assessments  Upper extremities      Right Upper Extremity Lymphedema   15 cm Proximal to Olecranon Process  35.3 cm    10 cm Proximal to Olecranon Process  32.3 cm    Olecranon Process  23.8 cm    15 cm Proximal to Ulnar Styloid Process  24 cm    10 cm Proximal to Ulnar Styloid Process  20.5 cm    Just Proximal to Ulnar Styloid Process  15.3 cm    Across Hand at [employer]  18 cm    At [place] of 2nd Digit  5.7 cm      Left Upper Extremity Lymphedema   15 cm Proximal to Olecranon Process  33.5 cm    10 cm Proximal to Olecranon Process  32.5 cm    Olecranon Process  24.5 cm    15 cm Proximal to Ulnar Styloid Process  23.8 cm    10 cm Proximal to Ulnar Styloid Process  21 cm    Just Proximal to Ulnar Styloid Process  15 cm    Across Hand at [employer]  16.9 cm    At [place] of 2nd Digit  5.8 cm          Quick Dash - 05/18/18 0001    Open a tight or new jar  Mild difficulty    Do heavy household chores (wash walls, wash floors)  Mild difficulty    Carry a shopping bag or briefcase  Moderate difficulty    Wash your back  Moderate difficulty    Use a knife to cut food  Mild difficulty    Recreational activities in which you take some force or impact through your arm, shoulder, or hand (golf, hammering, tennis)  Mild difficulty    During the past week, to what extent has your arm, shoulder or hand problem interfered with your normal social activities with family, friends, neighbors, or groups?  Slightly    During the past week, to what extent has your arm, shoulder  or hand problem limited your work or other regular daily activities  Slightly    Arm, shoulder, or hand pain.  Mild    Tingling (pins and needles) in your arm, shoulder, or hand  Mild    Difficulty Sleeping  No difficulty    DASH Score  27.27 %        Objective measurements completed on examination: See above findings.      [facility] Adult PT Treatment/Exercise - 05/18/18 0001      Exercises   Exercises  Other Exercises    Other Exercises   Given strength ABC handout              PT Education - 05/18/18 1106    Education Details  lymphedema, Lipedema, compression uses, initial HEP    Person(s) Educated  Patient    Methods  Explanation;Handout    Comprehension  Verbalized understanding          PT Long Term Goals - 05/18/18 1121      PT LONG TERM GOAL #1   Title  Pt will be educated on lymphedema precautions and reasons for occurrence  Time  5    Period  Weeks    Status  New    Target Date  06/22/18      PT LONG TERM GOAL #2   Title  Pt will improve Rt shoulder flexion and abduction AROM to no pull or upper arm pain    Time  5    Period  Weeks    Status  New    Target Date  06/22/18      PT LONG TERM GOAL #3   Title  Pt will be independent with final HEP for continued Rt shoulder ROm and strength    Time  5    Period  Weeks    Status  New    Target Date  06/22/18      PT LONG TERM GOAL #4   Title  Pt will be educated on how to obtain a prophylactic UE garment and LE lipedema garments    Time  5    Period  Weeks    Status  New    Target Date  06/22/18             Plan - 05/18/18 1039    Clinical Impression Statement  Pt presents with decreased ROM of the Rt shoulder, upper arm pain with ROM and daily activities (tricep region), no knowledge of lymphedema, and typical lipedema presentation of the LEs.  She would like to be educated regarding Rt shoulder ROM and strength, lymphedema precautions, compression ideas for the UE as well as the LEs  after discussion about lipedema compression.      History and Personal Factors relevant to plan of care:  Stage IB (cT2(m), cN0, cM0, G2, ER+, PR+, HER2-); 6/13 LUMPECTOMY WITH SENTINEL LYMPH NODE BIOPSY by Dr. Barry Dienes.  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 (discuss lipedema appropriate garments: elvarex soft prescription? OTS bioflect or solidea?) send sleeve measurements to [facility] for medicaid or does she need to measure?  RE check Lt shoulder ROM, review current HEP, any more appointments needed?    PT Home Exercise Plan  given strength ABC     Consulted and Agree with Plan of Care  Patient       Patient will benefit from skilled therapeutic intervention in order to improve the following deficits and impairments:  Decreased knowledge of precautions, Decreased activity tolerance, Decreased knowledge of use of DME, Decreased strength, Impaired UE functional use, Pain, Decreased range of motion  Visit Diagnosis: At risk for lymphedema  Pain in right upper arm  Stiffness of right shoulder, not elsewhere classified  Abnormal posture     Problem List Patient Active Problem List   Diagnosis Date Noted  . Breast cancer metastasized to axillary lymph node, right ([facility]) 04/17/2018  . Malignant neoplasm of lower-outer quadrant  of right breast of female, estrogen receptor positive ([facility]) 03/11/2018  . Post-operative state 03/06/2018  . Uterine hyperplasia 10/11/2017  . Anxiety and depression 10/15/2014  . Vitamin B 12 deficiency 06/02/2014  . Vitamin D deficiency 06/02/2014  . Hereditary and idiopathic peripheral neuropathy 05/14/2014    Shan Levans, PT 05/18/2018, 11:24 AM  [place], [place], [address] Phone: [phone]   Fax:  [phone]  Name: Amy Roman MRN: [id number] Date of Birth: January 21, 1951

## 2018-05-18 NOTE — Patient Instructions (Signed)
Given strength ABC handout to include lymphedema education as well as ROM exercises for the UE.  Pt will continue with these until we recheck her next appt

## 2018-05-21 ENCOUNTER — Ambulatory Visit
Admission: RE | Admit: 2018-05-21 | Discharge: 2018-05-21 | Disposition: A | Discharge status: Home / Self Care | Payer: Medicare Other | Source: Ambulatory Visit | Attending: Radiation Oncology | Admitting: Radiation Oncology

## 2018-05-21 DIAGNOSIS — C50511 Malignant neoplasm of lower-outer quadrant of right female breast: Secondary | ICD-10-CM | POA: Insufficient documentation

## 2018-05-21 DIAGNOSIS — Z170 Estrogen receptor positive status [ER+]: Secondary | ICD-10-CM | POA: Insufficient documentation

## 2018-05-21 DIAGNOSIS — Z510 Encounter for antineoplastic radiation therapy: Secondary | ICD-10-CM | POA: Diagnosis not present

## 2018-05-21 NOTE — Progress Notes (Signed)
  Radiation Oncology         [phone] ________________________________  Name: Amy Roman MRN: [id number]  Date: 05/21/2018  DOB: 02-Nov-1950  SIMULATION AND TREATMENT PLANNING NOTE    Outpatient  DIAGNOSIS:     ICD-10-CM   1. Malignant neoplasm of 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, estrogen receptor positive ([facility]) C50.511    Z17.0     NARRATIVE:  The patient was brought to the [facility].  Identity was confirmed.  All relevant records and images related to the planned course of therapy were reviewed.  The patient freely provided informed written consent to proceed with treatment after reviewing the details related to the planned course of therapy. The consent form was witnessed and verified by the simulation staff.    Then, the patient was set-up in a stable reproducible supine position for radiation therapy with her ipsilateral arm over her head, and her upper body secured in a custom-made Vac-lok device.  CT images were obtained.  Surface markings were placed.  The CT images were loaded into the planning software.    TREATMENT PLANNING NOTE: Treatment planning then occurred.  The radiation prescription was entered and confirmed.     A total of 5 medically necessary complex treatment devices were fabricated and supervised by me: 4 fields with MLCs for custom blocks to protect heart, and lungs;  and, a Vac-lok. MORE COMPLEX DEVICES MAY BE MADE IN DOSIMETRY FOR FIELD IN FIELD BEAMS FOR DOSE HOMOGENEITY.  I have requested : 3D Simulation which is medically necessary to give adequate dose to at risk tissues while sparing lungs and heart.  I have requested a DVH of the following structures: lungs, heart, right lumpectomy cavity, esophagus.    The patient will receive 50 Gy in 25 fractions to the right breast and nodes with 4 fields.  This will be followed by a boost.  Optical Surface Tracking Plan:  Since intensity modulated radiotherapy (IMRT) and 3D  conformal radiation treatment methods are predicated on accurate and precise positioning for treatment, intrafraction motion monitoring is medically necessary to ensure accurate and safe treatment delivery. The ability to quantify intrafraction motion without excessive ionizing radiation dose can only be performed with optical surface tracking. Accordingly, surface imaging offers the opportunity to obtain 3D measurements of patient position throughout IMRT and 3D treatments without excessive radiation exposure. I am ordering optical surface tracking for this patient's upcoming course of radiotherapy.  ________________________________   Reference:  Ursula Alert, J, et al. Surface imaging-based analysis of intrafraction motion for breast radiotherapy patients.Journal of [place], n. 6, nov. 2014. ISSN [id number].  Available at: <http://www.jacmp.org/index.php/jacmp/article/view/4957>.    -----------------------------------  Eppie Gibson, MD

## 2018-05-22 DIAGNOSIS — Z510 Encounter for antineoplastic radiation therapy: Secondary | ICD-10-CM | POA: Diagnosis not present

## 2018-05-22 DIAGNOSIS — C50511 Malignant neoplasm of lower-outer quadrant of right female breast: Secondary | ICD-10-CM | POA: Diagnosis not present

## 2018-05-22 DIAGNOSIS — Z170 Estrogen receptor positive status [ER+]: Secondary | ICD-10-CM | POA: Diagnosis not present

## 2018-05-28 ENCOUNTER — Ambulatory Visit
Admission: RE | Admit: 2018-05-28 | Discharge: 2018-05-28 | Disposition: A | Discharge status: Home / Self Care | Payer: Medicare Other | Source: Ambulatory Visit | Attending: Radiation Oncology | Admitting: Radiation Oncology

## 2018-05-28 DIAGNOSIS — Z170 Estrogen receptor positive status [ER+]: Secondary | ICD-10-CM | POA: Diagnosis not present

## 2018-05-28 DIAGNOSIS — Z510 Encounter for antineoplastic radiation therapy: Secondary | ICD-10-CM | POA: Insufficient documentation

## 2018-05-28 DIAGNOSIS — C50511 Malignant neoplasm of lower-outer quadrant of right female breast: Secondary | ICD-10-CM | POA: Insufficient documentation

## 2018-05-29 ENCOUNTER — Ambulatory Visit
Admission: RE | Admit: 2018-05-29 | Discharge: 2018-05-29 | Disposition: A | Discharge status: Home / Self Care | Payer: Medicare Other | Source: Ambulatory Visit | Attending: Radiation Oncology | Admitting: Radiation Oncology

## 2018-05-29 DIAGNOSIS — Z510 Encounter for antineoplastic radiation therapy: Secondary | ICD-10-CM | POA: Diagnosis not present

## 2018-05-29 DIAGNOSIS — C50911 Malignant neoplasm of unspecified site of right female breast: Secondary | ICD-10-CM

## 2018-05-29 DIAGNOSIS — Z170 Estrogen receptor positive status [ER+]: Secondary | ICD-10-CM | POA: Diagnosis not present

## 2018-05-29 DIAGNOSIS — C50511 Malignant neoplasm of lower-outer quadrant of right female breast: Secondary | ICD-10-CM | POA: Diagnosis not present

## 2018-05-29 DIAGNOSIS — C773 Secondary and unspecified malignant neoplasm of axilla and upper limb lymph nodes: Principal | ICD-10-CM

## 2018-05-29 MED ORDER — RADIAPLEXRX EX GEL
Freq: Once | CUTANEOUS | Status: AC
  Administered 2018-05-29: 17:00:00 via TOPICAL

## 2018-05-29 MED ORDER — ALRA NON-METALLIC DEODORANT (RAD-ONC)
1.0000 "application " | Freq: Once | TOPICAL | Status: AC
  Administered 2018-05-29: 1 via TOPICAL

## 2018-05-29 NOTE — Progress Notes (Signed)

## 2018-05-30 ENCOUNTER — Ambulatory Visit
Admission: RE | Admit: 2018-05-30 | Discharge: 2018-05-30 | Disposition: A | Discharge status: Home / Self Care | Payer: Medicare Other | Source: Ambulatory Visit | Attending: Radiation Oncology | Admitting: Radiation Oncology

## 2018-05-30 DIAGNOSIS — Z170 Estrogen receptor positive status [ER+]: Secondary | ICD-10-CM | POA: Diagnosis not present

## 2018-05-30 DIAGNOSIS — Z510 Encounter for antineoplastic radiation therapy: Secondary | ICD-10-CM | POA: Diagnosis not present

## 2018-05-30 DIAGNOSIS — C50511 Malignant neoplasm of lower-outer quadrant of right female breast: Secondary | ICD-10-CM | POA: Diagnosis not present

## 2018-05-31 ENCOUNTER — Ambulatory Visit
Admission: RE | Admit: 2018-05-31 | Discharge: 2018-05-31 | Disposition: A | Discharge status: Home / Self Care | Payer: Medicare Other | Source: Ambulatory Visit | Attending: Radiation Oncology | Admitting: Radiation Oncology

## 2018-05-31 DIAGNOSIS — C50511 Malignant neoplasm of lower-outer quadrant of right female breast: Secondary | ICD-10-CM | POA: Diagnosis not present

## 2018-05-31 DIAGNOSIS — Z510 Encounter for antineoplastic radiation therapy: Secondary | ICD-10-CM | POA: Diagnosis not present

## 2018-05-31 DIAGNOSIS — Z170 Estrogen receptor positive status [ER+]: Secondary | ICD-10-CM | POA: Diagnosis not present

## 2018-06-01 ENCOUNTER — Ambulatory Visit: Payer: Medicare Other | Admitting: Physical Therapy

## 2018-06-01 ENCOUNTER — Ambulatory Visit
Admission: RE | Admit: 2018-06-01 | Discharge: 2018-06-01 | Disposition: A | Discharge status: Home / Self Care | Payer: Medicare Other | Source: Ambulatory Visit | Attending: Radiation Oncology | Admitting: Radiation Oncology

## 2018-06-01 DIAGNOSIS — C50511 Malignant neoplasm of lower-outer quadrant of right female breast: Secondary | ICD-10-CM | POA: Diagnosis not present

## 2018-06-01 DIAGNOSIS — Z170 Estrogen receptor positive status [ER+]: Secondary | ICD-10-CM | POA: Diagnosis not present

## 2018-06-01 DIAGNOSIS — Z510 Encounter for antineoplastic radiation therapy: Secondary | ICD-10-CM | POA: Diagnosis not present

## 2018-06-04 ENCOUNTER — Ambulatory Visit
Admission: RE | Admit: 2018-06-04 | Discharge: 2018-06-04 | Disposition: A | Discharge status: Home / Self Care | Payer: Medicare Other | Source: Ambulatory Visit | Attending: Radiation Oncology | Admitting: Radiation Oncology

## 2018-06-04 DIAGNOSIS — C50511 Malignant neoplasm of lower-outer quadrant of right female breast: Secondary | ICD-10-CM | POA: Diagnosis not present

## 2018-06-04 DIAGNOSIS — Z510 Encounter for antineoplastic radiation therapy: Secondary | ICD-10-CM | POA: Diagnosis not present

## 2018-06-04 DIAGNOSIS — Z170 Estrogen receptor positive status [ER+]: Secondary | ICD-10-CM | POA: Diagnosis not present

## 2018-06-05 ENCOUNTER — Ambulatory Visit
Admission: RE | Admit: 2018-06-05 | Discharge: 2018-06-05 | Disposition: A | Discharge status: Home / Self Care | Payer: Medicare Other | Source: Ambulatory Visit | Attending: Radiation Oncology | Admitting: Radiation Oncology

## 2018-06-05 DIAGNOSIS — C50511 Malignant neoplasm of lower-outer quadrant of right female breast: Secondary | ICD-10-CM | POA: Diagnosis not present

## 2018-06-05 DIAGNOSIS — Z510 Encounter for antineoplastic radiation therapy: Secondary | ICD-10-CM | POA: Diagnosis not present

## 2018-06-05 DIAGNOSIS — Z170 Estrogen receptor positive status [ER+]: Secondary | ICD-10-CM | POA: Diagnosis not present

## 2018-06-06 ENCOUNTER — Ambulatory Visit
Admission: RE | Admit: 2018-06-06 | Discharge: 2018-06-06 | Disposition: A | Discharge status: Home / Self Care | Payer: Medicare Other | Source: Ambulatory Visit | Attending: Radiation Oncology | Admitting: Radiation Oncology

## 2018-06-06 DIAGNOSIS — F411 Generalized anxiety disorder: Secondary | ICD-10-CM | POA: Diagnosis not present

## 2018-06-06 DIAGNOSIS — F330 Major depressive disorder, recurrent, mild: Secondary | ICD-10-CM | POA: Diagnosis not present

## 2018-06-06 DIAGNOSIS — Z510 Encounter for antineoplastic radiation therapy: Secondary | ICD-10-CM | POA: Diagnosis not present

## 2018-06-06 DIAGNOSIS — Z170 Estrogen receptor positive status [ER+]: Secondary | ICD-10-CM | POA: Diagnosis not present

## 2018-06-06 DIAGNOSIS — F331 Major depressive disorder, recurrent, moderate: Secondary | ICD-10-CM | POA: Diagnosis not present

## 2018-06-06 DIAGNOSIS — C50511 Malignant neoplasm of lower-outer quadrant of right female breast: Secondary | ICD-10-CM | POA: Diagnosis not present

## 2018-06-07 ENCOUNTER — Ambulatory Visit
Admission: RE | Admit: 2018-06-07 | Discharge: 2018-06-07 | Disposition: A | Discharge status: Home / Self Care | Payer: Medicare Other | Source: Ambulatory Visit | Attending: Radiation Oncology | Admitting: Radiation Oncology

## 2018-06-07 DIAGNOSIS — C50511 Malignant neoplasm of lower-outer quadrant of right female breast: Secondary | ICD-10-CM | POA: Diagnosis not present

## 2018-06-07 DIAGNOSIS — Z170 Estrogen receptor positive status [ER+]: Secondary | ICD-10-CM | POA: Diagnosis not present

## 2018-06-07 DIAGNOSIS — Z510 Encounter for antineoplastic radiation therapy: Secondary | ICD-10-CM | POA: Diagnosis not present

## 2018-06-08 ENCOUNTER — Ambulatory Visit
Admission: RE | Admit: 2018-06-08 | Discharge: 2018-06-08 | Disposition: A | Discharge status: Home / Self Care | Payer: Medicare Other | Source: Ambulatory Visit | Attending: Radiation Oncology | Admitting: Radiation Oncology

## 2018-06-08 DIAGNOSIS — C50511 Malignant neoplasm of lower-outer quadrant of right female breast: Secondary | ICD-10-CM | POA: Diagnosis not present

## 2018-06-08 DIAGNOSIS — Z510 Encounter for antineoplastic radiation therapy: Secondary | ICD-10-CM | POA: Diagnosis not present

## 2018-06-08 DIAGNOSIS — Z170 Estrogen receptor positive status [ER+]: Secondary | ICD-10-CM | POA: Diagnosis not present

## 2018-06-10 ENCOUNTER — Ambulatory Visit: Admission: RE | Admit: 2018-06-10 | Payer: Medicare Other | Source: Ambulatory Visit

## 2018-06-11 ENCOUNTER — Other Ambulatory Visit: Payer: Self-pay | Admitting: Radiation Oncology

## 2018-06-11 ENCOUNTER — Ambulatory Visit
Admission: RE | Admit: 2018-06-11 | Discharge: 2018-06-11 | Disposition: A | Discharge status: Home / Self Care | Payer: Medicare Other | Source: Ambulatory Visit | Attending: Radiation Oncology | Admitting: Radiation Oncology

## 2018-06-11 DIAGNOSIS — Z170 Estrogen receptor positive status [ER+]: Principal | ICD-10-CM

## 2018-06-11 DIAGNOSIS — C50511 Malignant neoplasm of lower-outer quadrant of right female breast: Secondary | ICD-10-CM | POA: Diagnosis not present

## 2018-06-11 DIAGNOSIS — Z510 Encounter for antineoplastic radiation therapy: Secondary | ICD-10-CM | POA: Diagnosis not present

## 2018-06-12 ENCOUNTER — Ambulatory Visit
Admission: RE | Admit: 2018-06-12 | Discharge: 2018-06-12 | Disposition: A | Discharge status: Home / Self Care | Payer: Medicare Other | Source: Ambulatory Visit | Attending: Radiation Oncology | Admitting: Radiation Oncology

## 2018-06-12 ENCOUNTER — Telehealth: Payer: Self-pay | Admitting: Hematology

## 2018-06-12 DIAGNOSIS — Z170 Estrogen receptor positive status [ER+]: Secondary | ICD-10-CM | POA: Diagnosis not present

## 2018-06-12 DIAGNOSIS — Z510 Encounter for antineoplastic radiation therapy: Secondary | ICD-10-CM | POA: Diagnosis not present

## 2018-06-12 DIAGNOSIS — C50511 Malignant neoplasm of lower-outer quadrant of right female breast: Secondary | ICD-10-CM | POA: Diagnosis not present

## 2018-06-12 NOTE — Telephone Encounter (Signed)
Used interpreter line to leave message for patient regarding upcoming aug appts per 8/19 sch message

## 2018-06-13 ENCOUNTER — Ambulatory Visit
Admission: RE | Admit: 2018-06-13 | Discharge: 2018-06-13 | Disposition: A | Discharge status: Home / Self Care | Payer: Medicare Other | Source: Ambulatory Visit | Attending: Radiation Oncology | Admitting: Radiation Oncology

## 2018-06-13 DIAGNOSIS — Z510 Encounter for antineoplastic radiation therapy: Secondary | ICD-10-CM | POA: Diagnosis not present

## 2018-06-13 DIAGNOSIS — Z170 Estrogen receptor positive status [ER+]: Secondary | ICD-10-CM | POA: Diagnosis not present

## 2018-06-13 DIAGNOSIS — C50511 Malignant neoplasm of lower-outer quadrant of right female breast: Secondary | ICD-10-CM | POA: Diagnosis not present

## 2018-06-14 ENCOUNTER — Ambulatory Visit
Admission: RE | Admit: 2018-06-14 | Discharge: 2018-06-14 | Disposition: A | Discharge status: Home / Self Care | Payer: Medicare Other | Source: Ambulatory Visit | Attending: Radiation Oncology | Admitting: Radiation Oncology

## 2018-06-14 DIAGNOSIS — C50511 Malignant neoplasm of lower-outer quadrant of right female breast: Secondary | ICD-10-CM | POA: Diagnosis not present

## 2018-06-14 DIAGNOSIS — Z510 Encounter for antineoplastic radiation therapy: Secondary | ICD-10-CM | POA: Diagnosis not present

## 2018-06-14 DIAGNOSIS — Z170 Estrogen receptor positive status [ER+]: Secondary | ICD-10-CM | POA: Diagnosis not present

## 2018-06-15 ENCOUNTER — Ambulatory Visit
Admission: RE | Admit: 2018-06-15 | Discharge: 2018-06-15 | Disposition: A | Discharge status: Home / Self Care | Payer: Medicare Other | Source: Ambulatory Visit | Attending: Radiation Oncology | Admitting: Radiation Oncology

## 2018-06-15 DIAGNOSIS — C50511 Malignant neoplasm of lower-outer quadrant of right female breast: Secondary | ICD-10-CM | POA: Diagnosis not present

## 2018-06-15 DIAGNOSIS — Z170 Estrogen receptor positive status [ER+]: Secondary | ICD-10-CM | POA: Diagnosis not present

## 2018-06-15 DIAGNOSIS — Z510 Encounter for antineoplastic radiation therapy: Secondary | ICD-10-CM | POA: Diagnosis not present

## 2018-06-18 ENCOUNTER — Ambulatory Visit
Admission: RE | Admit: 2018-06-18 | Discharge: 2018-06-18 | Disposition: A | Discharge status: Home / Self Care | Payer: Medicare Other | Source: Ambulatory Visit | Attending: Radiation Oncology | Admitting: Radiation Oncology

## 2018-06-18 DIAGNOSIS — Z510 Encounter for antineoplastic radiation therapy: Secondary | ICD-10-CM | POA: Diagnosis not present

## 2018-06-18 DIAGNOSIS — C50511 Malignant neoplasm of lower-outer quadrant of right female breast: Secondary | ICD-10-CM | POA: Diagnosis not present

## 2018-06-18 DIAGNOSIS — Z170 Estrogen receptor positive status [ER+]: Secondary | ICD-10-CM | POA: Diagnosis not present

## 2018-06-19 ENCOUNTER — Ambulatory Visit
Admission: RE | Admit: 2018-06-19 | Discharge: 2018-06-19 | Disposition: A | Discharge status: Home / Self Care | Payer: Medicare Other | Source: Ambulatory Visit | Attending: Radiation Oncology | Admitting: Radiation Oncology

## 2018-06-19 DIAGNOSIS — C50511 Malignant neoplasm of lower-outer quadrant of right female breast: Secondary | ICD-10-CM | POA: Diagnosis not present

## 2018-06-19 DIAGNOSIS — Z510 Encounter for antineoplastic radiation therapy: Secondary | ICD-10-CM | POA: Diagnosis not present

## 2018-06-19 DIAGNOSIS — Z170 Estrogen receptor positive status [ER+]: Secondary | ICD-10-CM | POA: Diagnosis not present

## 2018-06-20 ENCOUNTER — Ambulatory Visit
Admission: RE | Admit: 2018-06-20 | Discharge: 2018-06-20 | Disposition: A | Discharge status: Home / Self Care | Payer: Medicare Other | Source: Ambulatory Visit | Attending: Radiation Oncology | Admitting: Radiation Oncology

## 2018-06-20 ENCOUNTER — Other Ambulatory Visit: Payer: Self-pay | Admitting: Radiation Oncology

## 2018-06-20 DIAGNOSIS — Z170 Estrogen receptor positive status [ER+]: Principal | ICD-10-CM

## 2018-06-20 DIAGNOSIS — C50511 Malignant neoplasm of lower-outer quadrant of right female breast: Secondary | ICD-10-CM | POA: Diagnosis not present

## 2018-06-20 DIAGNOSIS — Z510 Encounter for antineoplastic radiation therapy: Secondary | ICD-10-CM | POA: Diagnosis not present

## 2018-06-20 MED ORDER — PROMETHAZINE HCL 25 MG PO TABS: 25.0000 mg | ORAL_TABLET | Freq: Four times a day (QID) | ORAL | 2 refills | Status: DC | PRN

## 2018-06-21 ENCOUNTER — Ambulatory Visit
Admission: RE | Admit: 2018-06-21 | Discharge: 2018-06-21 | Disposition: A | Discharge status: Home / Self Care | Payer: Medicare Other | Source: Ambulatory Visit | Attending: Radiation Oncology | Admitting: Radiation Oncology

## 2018-06-21 DIAGNOSIS — Z510 Encounter for antineoplastic radiation therapy: Secondary | ICD-10-CM | POA: Diagnosis not present

## 2018-06-21 DIAGNOSIS — C50511 Malignant neoplasm of lower-outer quadrant of right female breast: Secondary | ICD-10-CM | POA: Diagnosis not present

## 2018-06-21 DIAGNOSIS — Z170 Estrogen receptor positive status [ER+]: Secondary | ICD-10-CM | POA: Diagnosis not present

## 2018-06-22 ENCOUNTER — Ambulatory Visit: Payer: Medicare Other

## 2018-06-26 ENCOUNTER — Ambulatory Visit
Admission: RE | Admit: 2018-06-26 | Discharge: 2018-06-26 | Disposition: A | Discharge status: Home / Self Care | Payer: Medicare Other | Source: Ambulatory Visit | Attending: Radiation Oncology | Admitting: Radiation Oncology

## 2018-06-26 DIAGNOSIS — Z510 Encounter for antineoplastic radiation therapy: Secondary | ICD-10-CM | POA: Insufficient documentation

## 2018-06-26 DIAGNOSIS — C50511 Malignant neoplasm of lower-outer quadrant of right female breast: Secondary | ICD-10-CM | POA: Diagnosis not present

## 2018-06-26 DIAGNOSIS — Z170 Estrogen receptor positive status [ER+]: Secondary | ICD-10-CM | POA: Diagnosis not present

## 2018-06-27 ENCOUNTER — Ambulatory Visit
Admission: RE | Admit: 2018-06-27 | Discharge: 2018-06-27 | Disposition: A | Discharge status: Home / Self Care | Payer: Medicare Other | Source: Ambulatory Visit | Attending: Radiation Oncology | Admitting: Radiation Oncology

## 2018-06-27 ENCOUNTER — Other Ambulatory Visit: Payer: Self-pay | Admitting: Hematology

## 2018-06-27 DIAGNOSIS — Z170 Estrogen receptor positive status [ER+]: Secondary | ICD-10-CM | POA: Diagnosis not present

## 2018-06-27 DIAGNOSIS — Z510 Encounter for antineoplastic radiation therapy: Secondary | ICD-10-CM | POA: Diagnosis not present

## 2018-06-27 DIAGNOSIS — C50511 Malignant neoplasm of lower-outer quadrant of right female breast: Secondary | ICD-10-CM | POA: Diagnosis not present

## 2018-06-27 NOTE — Progress Notes (Signed)
[facility]  [phone] [phone]  Clinic Follow Up Note   Patient Care Team: Lin Landsman, MD as PCP - General (Family Medicine) Alda Berthold, DO as Consulting Physician (Neurology) Truitt Merle, MD as Consulting Physician (Hematology) Stark Klein, MD as Consulting Physician (General Surgery) Eppie Gibson, MD as Attending Physician (Radiation Oncology)   Date of Service:  06/28/2018  CHIEF COMPLAINTS:  Follow up for right breast cancer    Oncology History   Cancer Staging Malignant neoplasm of 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, estrogen receptor positive ([place]) Staging form: Breast, AJCC 8th Edition - Clinical stage from 02/16/2018: Stage IB (cT2(m), cN0, cM0, G2, ER+, PR+, HER2-) - Signed by Truitt Merle, MD on 03/11/2018      Malignant neoplasm of 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, estrogen receptor positive ([place])   01/30/2018 Mammogram    Screening mammogram FINDINGS: In the right breast, possible distortion warrants further evaluation. The patient has had an excisional biopsy of her right breast upper outer quadrant in 2007, however this distortion is either better seen or new from her prior mammogram, and it does not quite match the surgical site as marked by a skin scar marker. In the left breast, no findings suspicious for malignancy.    02/14/2018 Mammogram    Diagnostic mammogram: In the lateral aspect of the right breast, posterior depth there is a prominent area of distortion at the approximate 9 o'clock location. While this is in the region of the excisional biopsy that the patient had in 2007, the distortion has become significantly more prominent as compared to the tomosynthesis mammogram performed in September of 2016, and appears to be inferior to the surgical scar.    02/14/2018 Breast US    Ultrasound of the right breast at 8:30, 4 cm from the nipple demonstrates an irregular hypoechoic vascular mass with  spiculated margins measuring 2.1 x 2.0 x 1.8 cm. There is a small adjacent mass at 9 o'clock, 4 cm from the nipple, which is 1.2 cm away from the dominant mass. The smaller mass measures 1.1 x 0.7 x 0.7 cm. Together, the 2 masses span 3.6 cm of tissue. Ultrasound of the right axilla demonstrates multiple normal-appearing lymph nodes.  IMPRESSION: 1. There is a highly suspicious mass in the right breast at 830 with a small possible satellite lesion at 9 o'clock. 2.  No evidence of right axillary lymphadenopathy.    02/16/2018 Cancer Staging    Staging form: Breast, AJCC 8th Edition - Clinical stage from 02/16/2018: Stage IB (cT2(m), cN0, cM0, G2, ER+, PR+, HER2-) - Signed by Truitt Merle, MD on 03/11/2018    02/16/2018 Initial Biopsy    Diagnosis 1. Breast, right, needle core biopsy, 8:30 o'clock - INVASIVE DUCTAL CARCINOMA, SEE COMMENT. 2. Breast, right, needle core biopsy, 9 o'clock - INVASIVE DUCTAL CARCINOMA. - LOBULAR NEOPLASIA (ATYPICAL LOBULAR HYPERPLASIA).  ER: 95%, positive, strong staining PR: 80%, positive, moderate staining  Proliferation Marker Ki67: 2%  HER2 - Negative Ratio of HER2/CEP17 signals: 1.80 Average HER2 copy number per cell: 2.97     03/01/2018 Imaging    MR Bilateral breast IMPRESSION: Two areas of known malignancy in the LATERAL portion of the RIGHT breast. No additional areas of concern in either breast. No axillary adenopathy.    03/11/2018 Initial Diagnosis    Malignant neoplasm of 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, estrogen receptor positive ([place])    04/05/2018 Surgery    RIGHT BREAST SEED BRACKETED  LUMPECTOMY WITH SENTINEL LYMPH NODE BIOPSY by Dr. Barry Dienes 04/05/18    04/05/2018 Pathology Results    Surgery  Diagnosis 04/05/18  1. Breast, lumpectomy, right - INVASIVE DUCTAL CARCINOMA GRADE I/III, TWO FOCI SPANNING 2.9 CM AND 0.9 CM. - LOBULAR NEOPLASIA (LOBULAR CARCINOMA IN SITU). - DUCTAL CARCINOMA IN SITU, LOW GRADE. - LYMPHOVASCULAR  INVASION IS IDENTIFIED. - THE SURGICAL RESECTION MARGINS ARE NEGATIVE FOR DUCTAL CARCINOMA. - SEE ONCOLOGY TABLE BELOW. 2. Lymph node, sentinel, biopsy, right - THERE IS NO EVIDENCE OF CARCINOMA IN 1 OF 1 LYMPH NODE (0/1). 3. Lymph node, sentinel, biopsy, right - METASTATIC CARCINOMA IN 1 OF 1 LYMPH NODE (1/1) WITH EXTRACAPSULAR EXTENSION. - EXTRACAPSULAR EXTENSION EXTENDS 0.3 CM. 4. Lymph node, sentinel, biopsy, right - DUCTAL CARCINOMA. - PERINEURAL INVASION IS IDENTIFIED. - SEE COMMENT. 5. Lymph node, sentinel, biopsy, right 1 of 4    04/05/2018 Miscellaneous    Mammaprint Low Risk with 10% risk of recurrance with Tamoxifen alone  MPI at +0.240 DMFI at 97.8%    04/12/2018 Cancer Staging    Staging form: Breast, AJCC 8th Edition - Pathologic: Stage IA (pT2, pN1a, cM0, G1, ER+, PR+, HER2-) - Signed by Truitt Merle, MD on 04/12/2018    04/13/2018 Imaging    Whole Body Bone scan 04/13/18 IMPRESSION: No scintigraphic evidence of osseous metastatic disease.    04/16/2018 Imaging    CT CAP W Contrast 04/16/18  IMPRESSION: 1. No definite findings of metastatic disease in the chest. Solitary solid 4 mm right lower lobe pulmonary nodule, for which initial chest CT follow-up is advised in 3 months. 2. Postsurgical changes from right breast lumpectomy and right axillary node dissection. Thin-walled fluid collections in the right breast and right axilla are most compatible with postsurgical seromas. 3. No evidence of metastatic disease in the abdomen or pelvis.     04/17/2018 Surgery    AXILLARY LYMPH NODE DISSECTION by Dr. Barry Dienes 04/17/18      04/17/2018 Pathology Results    Diagnosis 04/17/18  Lymph nodes, regional resection, Right Axillary - BENIGN FIBROADIPOSE AND BREAST TISSUE WITH FAT NECROSIS. - NO LYMPHOID TISSUE PRESENT. - NO MALIGNANCY IDENTIFIED. Microscopic Comment The entire specimen was submitted.      HISTORY OF PRESENTING ILLNESS:  Amy Roman 67 y.o.  female is here because of newly diagnosed right breast cancer. She was referred by her surgeon, Dr. Barry Dienes for further evaluation. She is accompanied by an interpretor today. She denies feeling a lump prior to the mammogram. She denies prior breast cancer, however she notes that she had a biopsy in 2007 that returned negative.  She had a screening mammogram on 01/30/2018 that showed: Breast density category C. Further evaluation is suggested for possible distortion in the right breast.  She then proceeded to a diagnostic mammogram and right ultrasonography on 02/14/2018 with results showing: There is a highly suspicious mass in the right breast at 8:30 with a small possible satellite lesion at 9 o'clock. No evidence of right axillary lymphadenopathy.  She also had a MR Bilateral breast on 03/01/2018 showing: Two areas of known malignancy in the LATERAL portion of the RIGHT breast. No additional areas of concern in either breast. No axillary adenopathy.  She had a right needle core biopsy on 02/16/2018 with results showing: Breast, right, needle core biopsy, 8:30 o'clock with invasive ductal carcinoma. Breast, right, needle core biopsy, 9 o'clock with invasive ductal carcinoma. Lobular neoplasia (atypical lobular hyperplasia). Prognostic indicators significant for: ER, 95% positive with strong staining intensity  and PR, 80% positive, with moderate staining intensity. Proliferation marker Ki67 at 2%. HER2 negative.  She also had a hysterectomy with BSO due to vaginal bleeding intermittently occurring since October 2018 completed on 03/06/2018 with pathology showing: Uterus, ovaries and fallopian tubes, cervix: Cervix with nabothian cysts and squamous metaplasia. No dysplasia or malignancy. Endometrium with inactive endometrium. No hyperplasia or malignancy. Myometrium unremarkable with no evidence of malignancy. Uterine serosa unremarkable with no endometriosis or malignancy. Right and left ovaries unremarkable  with no endometriosis or malignancy. Right and left fallopian tubes with benign paratubal cyst. No endometriosis or malignancy.   On review of systems, she reports lower abdominal pain and lower back pain following her recent hysterotomy and BSO. she denies back pain and any other symptoms. Pertinent positives are listed and detailed within the above HPI.  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 She denies a hx of DM or any other hx of cancer. She has a FHx of her maternal aunt with leukemia and a cousin with  Another type of cancer with lumps to her neck, however she is unsure of the name of the cancer. She denies a family hx of breast cancer or ovarian cancer. She doesn't smoke cigarettes or consume ETOH and she notes that she is retired at this time. 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 She has 4 sons, 3 of which live with her and the other lives out of the country with her youngest son being age 75 and eldest age 67 and she has 5 grandchildren.   GYN history:  Menarche: 59-16 years old Age at first live birth: 67 years old GP: GxP4 LMP: in her 28's  Contraceptive: N/a HRT: no   The patient is accompanied by a medical interpretor today, who the patient has elected to translate for her during this visit.     INTERVAL HISTORY   Giavana A Roman is her for a follow up. She is here with an interpreter. She is on radiation,  states that he is very tired and fatigued. She is also nauseated but had not vomited.  She denies pain or skin peeling, but had skin depigmentation. She has chronic knee pain.    MEDICAL HISTORY:  Past Medical History:  Diagnosis Date  . Anxiety and depression 10/15/2014  . Arthritis    BILATERAL KNEES, HAD CORTISONE INJECTION 03/2017  . Brain cancer ([place])   . Depression   . GERD (gastroesophageal reflux disease)    OCCASIONAL WITH CERTAIN FOOD  . Headache   . Seasonal allergies   . SVD (spontaneous vaginal  delivery)    x 4  . Unspecified hereditary and idiopathic peripheral neuropathy 05/14/2014   LEFT KNEE AND FOOT    SURGICAL HISTORY: Past Surgical History:  Procedure Laterality Date  . AXILLARY LYMPH NODE DISSECTION Right 04/17/2018   Procedure: AXILLARY LYMPH NODE DISSECTION;  Surgeon: Stark Klein, MD;  Location: [facility];  Service: General;  Laterality: Right;  . BREAST EXCISIONAL BIOPSY Right   . BREAST LUMPECTOMY  2007   RIGHT BREAST  . BREAST LUMPECTOMY WITH RADIOACTIVE SEED AND SENTINEL LYMPH NODE BIOPSY Right 04/05/2018   Procedure: RIGHT BREAST SEED BRACKETED LUMPECTOMY WITH SENTINEL LYMPH NODE BIOPSY;  Surgeon: Stark Klein, MD;  Location: [facility];  Service: General;  Laterality: Right;  . BREAST SURGERY     bx, no cancer   . CATARACT EXTRACTION Bilateral   . DILATATION &  CURETTAGE/HYSTEROSCOPY WITH MYOSURE N/A 08/09/2017   Procedure: DILATATION & CURETTAGE/HYSTEROSCOPY WITH MYOSURE;  Surgeon: Emily Filbert, MD;  Location: [facility];  Service: Gynecology;  Laterality: N/A;  . LABIOPLASTY  03/06/2018   Procedure: LABIAPLASTY;  Surgeon: Emily Filbert, MD;  Location: [facility];  Service: Gynecology;;  . VAGINAL HYSTERECTOMY Bilateral 03/06/2018   Procedure: HYSTERECTOMY VAGINAL WITH BILATERAL SALPINGOOPHORECTOMY;  Surgeon: Emily Filbert, MD;  Location: [facility];  Service: Gynecology;  Laterality: Bilateral;    SOCIAL HISTORY: Social History   Socioeconomic History  . Marital status: Married    Spouse name: Not on file  . Number of children: 4  . Years of education: Not on file  . Highest education level: Not on file  Occupational History  . Occupation: house   Social Needs  . Financial resource strain: Not on file  . Food insecurity:    Worry: Not on file    Inability: Not on file  . Transportation needs:    Medical: Not on file    Non-medical: Not on file  Tobacco Use  . Smoking status: Never Smoker  . Smokeless tobacco: Never Used    Substance and Sexual Activity  . Alcohol use: No  . Drug use: No  . Sexual activity: Not on file  Lifestyle  . Physical activity:    Days per week: Not on file    Minutes per session: Not on file  . Stress: Not on file  Relationships  . Social connections:    Talks on phone: Not on file    Gets together: Not on file    Attends religious service: Not on file    Active member of club or organization: Not on file    Attends meetings of clubs or organizations: Not on file    Relationship status: Not on file  . Intimate partner violence:    Fear of current or ex partner: Not on file    Emotionally abused: Not on file    Physically abused: Not on file    Forced sexual activity: Not on file  Other Topics Concern  . Not on file  Social History Narrative   Original from [place]   Moved to the [place] 2000   Widow    She lives with son.  She had four sons.          FAMILY HISTORY: Family History  Problem Relation Age of Onset  . Diabetes Mother   . Neuropathy Mother   . Neuropathy Sister   . Cancer Sister        unknown type cancer in neck   . Other Father   . Cancer Maternal Aunt        leukemia   . Colon cancer Neg Hx   . Breast cancer Neg Hx     ALLERGIES:  has No Known Allergies.  MEDICATIONS:  Current Outpatient Medications  Medication Sig Dispense Refill  . b complex vitamins tablet Take 1 tablet by mouth daily.    Marland Kitchen FLUoxetine (PROZAC) 40 MG capsule Take 40 mg by mouth daily.    Marland Kitchen gabapentin (NEURONTIN) 800 MG tablet Take 200 mg by mouth 4 (four) times daily as needed.     . promethazine (PHENERGAN) 25 MG tablet Take 1 tablet (25 mg total) by mouth every 6 (six) hours as needed for nausea or vomiting. 30 tablet 2  . ranitidine (ZANTAC) 150 MG tablet Take 150 mg by mouth 2 (two) times daily.    Marland Kitchen  traZODone (DESYREL) 100 MG tablet Take 100 mg by mouth at bedtime.    Marland Kitchen letrozole (FEMARA) 2.5 MG tablet Take 1 tablet (2.5 mg total) by mouth daily. 30 tablet 2   . ondansetron (ZOFRAN) 8 MG tablet Take 1 tablet (8 mg total) by mouth every 8 (eight) hours as needed for nausea or vomiting. 20 tablet 0   No current facility-administered medications for this visit.     REVIEW OF SYSTEMS:   Constitutional: Denies fevers, chills or abnormal night sweats (+) fatigue Eyes: Denies blurriness of vision, double vision or watery eyes Ears, nose, mouth, throat, and face: Denies mucositis or sore throat Respiratory: Denies cough, dyspnea or wheezes Cardiovascular: Denies palpitation, chest discomfort or lower extremity swelling Gastrointestinal:  Denies heartburn or change in bowel habits (+) nausea MSK: (+) Right axillary pain when lifting and mild limited ROM (+) arthritis, especially in B/l knees Skin: Denies abnormal skin rashes Lymphatics: Denies new lymphadenopathy or easy bruising Neurological:Denies numbness, tingling or new weaknesses Behavioral/Psych: Mood is stable, no new changes  All other systems were reviewed with the patient and are negative.  PHYSICAL EXAMINATION:  ECOG PERFORMANCE STATUS: 1 - Symptomatic but completely ambulatory  Vitals:   06/28/18 0955  BP: (!) 115/97  Pulse: 67  Resp: 18  Temp: 98 F (36.7 C)  SpO2: 100%   Filed Weights   06/28/18 0955  Weight: 183 lb 14.4 oz (83.4 kg)     GENERAL:alert, no distress and comfortable SKIN: skin color, texture, turgor are normal, no rashes or significant lesions EYES: normal, conjunctiva are pink and non-injected, sclera clear OROPHARYNX:no exudate, no erythema and lips, buccal mucosa, and tongue normal  NECK: supple, thyroid normal size, non-tender, without nodularity LYMPH:  no palpable lymphadenopathy in the cervical, axillary or inguinal LUNGS: clear to auscultation and percussion with normal breathing effort HEART: regular rate & rhythm and no murmurs and no lower extremity edema ABDOMEN:abdomen soft, non-tender and normal bowel sounds Musculoskeletal:no cyanosis of  digits and no clubbing  PSYCH: alert & oriented x 3 with fluent speech NEURO: no focal motor/sensory deficits Breast: (+) s/p right breast lumpectomy and right axillary re-excision: her breast incision has healed well. (+) skin pigmentation in right breast and axilla due to radiation, no skin breakdown  LABORATORY DATA:  I have reviewed the data as listed CBC Latest Ref Rng & Units 06/28/2018 03/07/2018 02/28/2018  WBC 3.9 - 10.3 K/uL 3.4(L) 6.3 4.1  Hemoglobin 11.6 - 15.9 g/dL 8.3(L) 10.3(L) 12.0  Hematocrit 34.8 - 46.6 % 25.9(L) 30.9(L) 36.5  Platelets 145 - 400 K/uL 260 156 220    CMP Latest Ref Rng & Units 06/28/2018 04/12/2018 02/28/2018  Glucose 70 - 99 mg/dL 94 95 107(H)  BUN 8 - 23 mg/dL _0 Creatinine 0.44 - 1.00 mg/dL 0.84 0.96 0.80  Sodium 135 - 145 mmol/L 139 141 136  Potassium 3.5 - 5.1 mmol/L 4.7 4.7 4.3  Chloride 98 - 111 mmol/L 106 106 102  CO2 22 - 32 mmol/L _1 Calcium 8.9 - 10.3 mg/dL 9.5 10.0 9.4  Total Protein 6.5 - 8.1 g/dL 6.9 - -  Total Bilirubin 0.3 - 1.2 mg/dL 0.3 - -  Alkaline Phos 38 - 126 U/L 97 - -  AST 15 - 41 U/L 23 - -  ALT 0 - 44 U/L 14 - -    PATHOLOGY:  Surgery  Diagnosis 04/05/18  1. Breast, lumpectomy, right - INVASIVE DUCTAL CARCINOMA GRADE I/III, TWO FOCI SPANNING  2.9 CM AND 0.9 CM. - LOBULAR NEOPLASIA (LOBULAR CARCINOMA IN SITU). - DUCTAL CARCINOMA IN SITU, LOW GRADE. - LYMPHOVASCULAR INVASION IS IDENTIFIED. - THE SURGICAL RESECTION MARGINS ARE NEGATIVE FOR DUCTAL CARCINOMA. - SEE ONCOLOGY TABLE BELOW. 2. Lymph node, sentinel, biopsy, right - THERE IS NO EVIDENCE OF CARCINOMA IN 1 OF 1 LYMPH NODE (0/1). 3. Lymph node, sentinel, biopsy, right - METASTATIC CARCINOMA IN 1 OF 1 LYMPH NODE (1/1) WITH EXTRACAPSULAR EXTENSION. - EXTRACAPSULAR EXTENSION EXTENDS 0.3 CM. 4. Lymph node, sentinel, biopsy, right - DUCTAL CARCINOMA. - PERINEURAL INVASION IS IDENTIFIED. - SEE COMMENT. 5. Lymph node, sentinel, biopsy, right 1 of 4 Amended  copy Corrected FINAL for Roman, Amy A ([id number]) Diagnosis(continued) - METASTATIC CARCINOMA IN 1 OF 1 LYMPH NODE (1/1), WITH EXTRACAPSULAR EXTENSION. - EXTRACAPSULAR EXTENSION EXTENDS 0.7 CM. 6. Lymph node, sentinel, biopsy, right - METASTATIC CARCINOMA IN 1 OF 1 LYMPH NODE (1/1), WITH EXTRACAPSULAR EXTENSION. - EXTRACAPSULAR EXTENSION EXTENDS 0.2 CM. Microscopic Comment 1. INVASIVE CARCINOMA OF THE BREAST: Resection Procedure: Lumpectomy. Specimen Laterality: Right. Tumor Size: 2.9 cm and 0.9 cm (gross measurements). Histologic Type: Ductal. Histologic Grade: I. Glandular (Acinar)/Tubular Differentiation: 3. Nuclear Pleomorphism: 1. Mitotic Rate: 1. Overall Grade: I/III. Ductal Carcinoma In Situ: Present, low grade. Tumor Extension: Confined to breast parenchyma. Margins: Distance from closest margin (millimeters): Greater than 0.2 cm to all margins. DCIS Margins: Distance from closest margin (millimeters): Greater than 0.2 cm to all margins. Regional Lymph Nodes: Number of Lymph Nodes Examined: 4. Number of Sentinel Nodes Examined (if applicable): 4. Number of Lymph Nodes with Macrometastases (>2 mm): 3. Number of Lymph Nodes with Micrometastases: 0. Number of Lymph Nodes with Isolated Tumor Cells (?0.2 mm or ?200 cells)#: 0. Size of Largest Metastatic Deposit (millimeters): 9 mm. Extranodal Extension: Present, extensive. Treatment Effect: No known presurgical therapy. Breast Biomarker Testing Performed on Previous Biopsy: Yes. Testing Performed on Case Number: [id number]. Estrogen Receptor: 95%, strong. Progesterone Receptor: 80%, strong. HER2: No amplification was detected. The ratio was 1.80. Ki-67: 2%. Representative tumor block: 1D. Pathologic Stage Classification (pTNM, AJCC 8th Edition): mpT2, pN1c. (v4.2.0.0) 4. Lymph nodal tissue is not definitively identified in part 4.    Diagnosis, 03/06/2018 Uterus, ovaries and fallopian tubes,  cervix CERVIX: - NABOTHIAN CYSTS AND SQUAMOUS METAPLASIA. - NO DYSPLASIA OR MALIGNANCY. ENDOMETRIUM: - INACTIVE ENDOMETRIUM. - NO HYPERPLASIA OR MALIGNANCY. - SEE MICROSCOPIC DESCRIPTION. MYOMETRIUM: - UNREMARKABLE. - NO EVIDENCE OF MALIGNANCY. UTERINE SEROSA: - UNREMARKABLE. - NO ENDOMETRIOSIS OR MALIGNANCY. RIGHT AND LEFT OVARIES: - UNREMARKABLE. - NO ENDOMETRIOSIS OR MALIGNANCY. RIGHT AND LEFT FALLOPIAN TUBES: - BENIGN PARATUBAL CYST. - NO ENDOMETRIOSIS OR MALIGNANCY.  Diagnosis, 02/16/2018 1. Breast, right, needle core biopsy, 8:30 o'clock - INVASIVE DUCTAL CARCINOMA, SEE COMMENT. 2. Breast, right, needle core biopsy, 9 o'clock - INVASIVE DUCTAL CARCINOMA. - LOBULAR NEOPLASIA (ATYPICAL LOBULAR HYPERPLASIA).         RADIOGRAPHIC STUDIES: I have personally reviewed the radiological images as listed and agreed with the findings in the report. No results found.  ASSESSMENT & PLAN:  Amy Roman is a 67 y.o. African female with a history of Arthritis and neuropathy  1.  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 Breast, right, needle core biopsy, 9 o'clock with invasive ductal carcinoma. Lobular neoplasia (atypical lobular hyperplasia). Prognostic indicators significant  for: ER, 95% positive with strong staining intensity and PR, 80% positive, with moderate staining intensity. Proliferation marker Ki67 at 2%. HER2 negative. -Due to the size of her 2 tumors, mastectomy was recommended, however patient strongly preferred lumpectomy and She underwent right breast lumpectomy by Dr. Barry Dienes on 04/05/18.  -I previously discussed her pathology results which showed her known 2 breast tumors with negative  margins, 3/4 positive lymph nodes, and 1 soft tissue deposit of cancer in the axilla.  -I previously discussed given her multiple positive lymph nodes, she would need axillary lymph node dissection to remove all lymph nodes, no further surgery of her breast is needed.  -Her staging CT CAP and bone scan from 03/2018 were negative for distant metastasis.  -She underwent R ALND on 04/17/18. Pathology was benign with no evidence of malignancy. She is cured at this point. I discussed there is still a 5-10% risk for recurrence likely in later years.  -She has clinical high risk disease, but her Mammoprint showed low risk luminal type A. Based on the test result, there is no benefit from adjuvant chemotherapy. I do not recommend chemotherapy.  -I suggest PT to help prevent lymphedema since she has undergone two surgeries.  -she has started adjuvant radiation,  -Giving the strong ER and PR expression in her postmenopausal status, I recommend adjuvant endocrine therapy with aromatase inhibitor for a total of 5-10 years to reduce the risk of distant cancer recurrence. -The potential benefit and side effects, which includes but not limited to, hot flash, skin and vaginal dryness, metabolic changes ( increased blood glucose, cholesterol, weight, etc.), slightly in increased risk of cardiovascular disease, cataracts, muscular and joint discomfort, osteopenia and osteoporosis, etc, were discussed with her in great details. She is interested, and we'll start after she completes radiation.  She is interested. -I will call in letrozole to her pharmacy today -She is fatigued and nauseated with radiation. She will have her last session on 07/10/2018.  She will start letrozole on July 24, 2018 -Labs today reviewed. CBC showed WBC 3.4K Hg 8.3 Hct 25.9 and Platelet 260K. CMP was pending.  -I advised her to maintain healthy diet and exercise as tolerable. -I prescribed Zofran for nausea -F/u in 3 months   2. Lung  nodules -Found on 04/16/18 CT scan  -I do not suspect this is related to her cancer but will monitor with a repeat scan at the end of the year.    3. Bone Health  -I discussed after radiation she will proceed with AI which can effect her bone density. I recommend a DEXA scan to get a baseline. She is agreeable. DEXA scheduled in a few weeks   4. Osteoarthritis -She had significant arthritis in her b/l knees -She notes being offered knee replacement surgery by her orthopedist but she declined.  -I discussed AI use may effect her joint pain. Will monitor while on medication.    5. Anxiety, depression -Patient does take anti-depressants  -Stable    Plan:  -I will start letrozole on July 24, 2089. -I prescribes Zofran for nausea -f/u in 3 months   All questions were answered. The patient knows to call the clinic with any problems, questions or concerns. I spent 20 minutes counseling the patient face to face. The total time spent in the appointment was 25 minutes and more than 50% was on counseling.  Dierdre Searles Dweik am acting as scribe for Dr. Truitt Merle.  I have reviewed the above documentation for accuracy  and completeness, and I agree with the above.        Truitt Merle, MD 06/28/2018 11:27 AM

## 2018-06-28 ENCOUNTER — Ambulatory Visit
Admission: RE | Admit: 2018-06-28 | Discharge: 2018-06-28 | Disposition: A | Discharge status: Home / Self Care | Payer: Medicare Other | Source: Ambulatory Visit | Attending: Radiation Oncology | Admitting: Radiation Oncology

## 2018-06-28 ENCOUNTER — Inpatient Hospital Stay: Payer: Medicare Other | Attending: Nurse Practitioner

## 2018-06-28 ENCOUNTER — Telehealth: Payer: Self-pay | Admitting: Hematology

## 2018-06-28 ENCOUNTER — Encounter: Payer: Self-pay | Admitting: Hematology

## 2018-06-28 ENCOUNTER — Inpatient Hospital Stay: Payer: Medicare Other | Admitting: Nutrition

## 2018-06-28 ENCOUNTER — Inpatient Hospital Stay (HOSPITAL_BASED_OUTPATIENT_CLINIC_OR_DEPARTMENT_OTHER): Payer: Medicare Other | Admitting: Hematology

## 2018-06-28 VITALS — BP 115/97 | HR 67 | Temp 98.0°F | Resp 18 | Ht 61.0 in | Wt 183.9 lb

## 2018-06-28 DIAGNOSIS — Z170 Estrogen receptor positive status [ER+]: Secondary | ICD-10-CM | POA: Diagnosis not present

## 2018-06-28 DIAGNOSIS — Z510 Encounter for antineoplastic radiation therapy: Secondary | ICD-10-CM | POA: Diagnosis not present

## 2018-06-28 DIAGNOSIS — C50511 Malignant neoplasm of lower-outer quadrant of right female breast: Secondary | ICD-10-CM | POA: Insufficient documentation

## 2018-06-28 LAB — CBC WITH DIFFERENTIAL (CANCER CENTER ONLY)
BASOS PCT: 2 %
Basophils Absolute: 0.1 10*3/uL (ref 0.0–0.1)
Eosinophils Absolute: 0.1 10*3/uL (ref 0.0–0.5)
Eosinophils Relative: 2 %
HEMATOCRIT: 25.9 % — AB (ref 34.8–46.6)
HEMOGLOBIN: 8.3 g/dL — AB (ref 11.6–15.9)
LYMPHS ABS: 0.4 10*3/uL — AB (ref 0.9–3.3)
LYMPHS PCT: 13 %
MCH: 26.7 pg (ref 25.1–34.0)
MCHC: 32.1 g/dL (ref 31.5–36.0)
MCV: 83.3 fL (ref 79.5–101.0)
MONO ABS: 0.5 10*3/uL (ref 0.1–0.9)
Monocytes Relative: 16 %
NEUTROS ABS: 2.4 10*3/uL (ref 1.5–6.5)
NEUTROS PCT: 67 %
Platelet Count: 260 10*3/uL (ref 145–400)
RBC: 3.11 MIL/uL — ABNORMAL LOW (ref 3.70–5.45)
RDW: 16.7 % — ABNORMAL HIGH (ref 11.2–14.5)
WBC Count: 3.4 10*3/uL — ABNORMAL LOW (ref 3.9–10.3)

## 2018-06-28 LAB — CMP (CANCER CENTER ONLY)
ALT: 14 U/L (ref 0–44)
AST: 23 U/L (ref 15–41)
Albumin: 3.7 g/dL (ref 3.5–5.0)
Alkaline Phosphatase: 97 U/L (ref 38–126)
Anion gap: 9 (ref 5–15)
BUN: 16 mg/dL (ref 8–23)
CHLORIDE: 106 mmol/L (ref 98–111)
CO2: 24 mmol/L (ref 22–32)
CREATININE: 0.84 mg/dL (ref 0.44–1.00)
Calcium: 9.5 mg/dL (ref 8.9–10.3)
GFR, Est AFR Am: >60 mL/min (ref >60)
Glucose, Bld: 94 mg/dL (ref 70–99)
POTASSIUM: 4.7 mmol/L (ref 3.5–5.1)
Sodium: 139 mmol/L (ref 135–145)
TOTAL PROTEIN: 6.9 g/dL (ref 6.5–8.1)
Total Bilirubin: 0.3 mg/dL (ref 0.3–1.2)

## 2018-06-28 MED ORDER — ONDANSETRON HCL 8 MG PO TABS: 8.0000 mg | ORAL_TABLET | Freq: Three times a day (TID) | ORAL | 0 refills | Status: DC | PRN

## 2018-06-28 MED ORDER — LETROZOLE 2.5 MG PO TABS: 2.5000 mg | ORAL_TABLET | Freq: Every day | ORAL | 2 refills | Status: DC

## 2018-06-28 NOTE — Telephone Encounter (Signed)
Scheduled appt per 9/5 los - gave patient AVS and calender per los.   

## 2018-06-28 NOTE — Progress Notes (Signed)
67 year old female diagnosed with breast cancer receiving radiation therapy.  Past medical history includes neuropathy, arthritis, depression and GERD  Medications include B complex vitamin Prozac and omega-3 fatty acids.  Labs were reviewed.  Height: 5 feet 1 inch. Weight: 183.9 pounds. Usual body weight: 195 pounds in March BMI: 34.75.  Patient is confused about what she should eat.  She also has been hearing a lot of myths around nutrition and cancer that she would like clarified.  Nutrition diagnosis: Food and nutrition related knowledge deficit related to breast cancer diagnosis as evidenced by no prior need for nutrition related information.  Intervention: Educated patient on importance of consuming primarily plant-based foods with lean protein sources. Encouraged patient to only rely on evidence-based resources. Many questions answered. Provided patient with multiple fact sheets. Questions were answered and teach back method used.  Monitoring, evaluation, goals: Patient will tolerate plant-based diet to minimize breast cancer recurrence.  No follow-up required.  Food and nutrition related knowledge deficit resolved.  **Disclaimer: This note was dictated with voice recognition software. Similar sounding words can inadvertently be transcribed and this note may contain transcription errors which may not have been corrected upon publication of note.**

## 2018-06-29 ENCOUNTER — Ambulatory Visit
Admission: RE | Admit: 2018-06-29 | Discharge: 2018-06-29 | Disposition: A | Discharge status: Home / Self Care | Payer: Medicare Other | Source: Ambulatory Visit | Attending: Radiation Oncology | Admitting: Radiation Oncology

## 2018-06-29 DIAGNOSIS — C50511 Malignant neoplasm of lower-outer quadrant of right female breast: Secondary | ICD-10-CM | POA: Diagnosis not present

## 2018-06-29 DIAGNOSIS — Z510 Encounter for antineoplastic radiation therapy: Secondary | ICD-10-CM | POA: Diagnosis not present

## 2018-06-29 DIAGNOSIS — Z170 Estrogen receptor positive status [ER+]: Secondary | ICD-10-CM | POA: Diagnosis not present

## 2018-06-29 LAB — CANCER ANTIGEN 27.29: CA 27.29: 18.4 U/mL (ref 0.0–38.6)

## 2018-06-30 ENCOUNTER — Other Ambulatory Visit: Payer: Self-pay | Admitting: Hematology

## 2018-06-30 DIAGNOSIS — E538 Deficiency of other specified B group vitamins: Secondary | ICD-10-CM

## 2018-06-30 DIAGNOSIS — D649 Anemia, unspecified: Secondary | ICD-10-CM

## 2018-07-02 ENCOUNTER — Ambulatory Visit
Admission: RE | Admit: 2018-07-02 | Discharge: 2018-07-02 | Disposition: A | Discharge status: Home / Self Care | Payer: Medicare Other | Source: Ambulatory Visit | Attending: Radiation Oncology | Admitting: Radiation Oncology

## 2018-07-02 ENCOUNTER — Telehealth: Payer: Self-pay | Admitting: Hematology

## 2018-07-02 DIAGNOSIS — Z510 Encounter for antineoplastic radiation therapy: Secondary | ICD-10-CM | POA: Diagnosis not present

## 2018-07-02 DIAGNOSIS — C50511 Malignant neoplasm of lower-outer quadrant of right female breast: Secondary | ICD-10-CM | POA: Diagnosis not present

## 2018-07-02 DIAGNOSIS — Z170 Estrogen receptor positive status [ER+]: Secondary | ICD-10-CM | POA: Diagnosis not present

## 2018-07-02 NOTE — Telephone Encounter (Signed)
Lab appt added per 9/9 sch msg/ patient's son notified

## 2018-07-03 ENCOUNTER — Ambulatory Visit
Admission: RE | Admit: 2018-07-03 | Discharge: 2018-07-03 | Disposition: A | Discharge status: Home / Self Care | Payer: Medicare Other | Source: Ambulatory Visit | Attending: Radiation Oncology | Admitting: Radiation Oncology

## 2018-07-03 ENCOUNTER — Inpatient Hospital Stay: Payer: Medicare Other

## 2018-07-03 DIAGNOSIS — D649 Anemia, unspecified: Secondary | ICD-10-CM

## 2018-07-03 DIAGNOSIS — C50511 Malignant neoplasm of lower-outer quadrant of right female breast: Secondary | ICD-10-CM

## 2018-07-03 DIAGNOSIS — Z170 Estrogen receptor positive status [ER+]: Secondary | ICD-10-CM

## 2018-07-03 DIAGNOSIS — Z510 Encounter for antineoplastic radiation therapy: Secondary | ICD-10-CM | POA: Diagnosis not present

## 2018-07-03 DIAGNOSIS — E538 Deficiency of other specified B group vitamins: Secondary | ICD-10-CM

## 2018-07-03 LAB — IRON AND TIBC
IRON: 22 ug/dL — AB (ref 41–142)
Saturation Ratios: 4 % — ABNORMAL LOW (ref 21–57)
TIBC: 505 ug/dL — ABNORMAL HIGH (ref 236–444)
UIBC: 483 ug/dL

## 2018-07-03 LAB — VITAMIN B12: Vitamin B-12: 385 pg/mL (ref 180–914)

## 2018-07-03 LAB — CBC WITH DIFFERENTIAL (CANCER CENTER ONLY)
BASOS PCT: 1 %
Basophils Absolute: 0 10*3/uL (ref 0.0–0.1)
EOS ABS: 0.1 10*3/uL (ref 0.0–0.5)
EOS PCT: 4 %
HCT: 27 % — ABNORMAL LOW (ref 34.8–46.6)
HEMOGLOBIN: 8.3 g/dL — AB (ref 11.6–15.9)
Lymphocytes Relative: 16 %
Lymphs Abs: 0.5 10*3/uL — ABNORMAL LOW (ref 0.9–3.3)
MCH: 26.3 pg (ref 25.1–34.0)
MCHC: 30.7 g/dL — AB (ref 31.5–36.0)
MCV: 85.4 fL (ref 79.5–101.0)
MONOS PCT: 15 %
Monocytes Absolute: 0.4 10*3/uL (ref 0.1–0.9)
NEUTROS PCT: 64 %
Neutro Abs: 1.8 10*3/uL (ref 1.5–6.5)
PLATELETS: 244 10*3/uL (ref 145–400)
RBC: 3.16 MIL/uL — ABNORMAL LOW (ref 3.70–5.45)
RDW: 15.9 % — AB (ref 11.2–14.5)
WBC Count: 2.7 10*3/uL — ABNORMAL LOW (ref 3.9–10.3)

## 2018-07-03 LAB — FERRITIN: FERRITIN: 9 ng/mL — AB (ref 11–307)

## 2018-07-03 LAB — RETICULOCYTES
RBC.: 3.16 MIL/uL — ABNORMAL LOW (ref 3.70–5.45)
RETIC COUNT ABSOLUTE: 72.7 10*3/uL (ref 33.7–90.7)
RETIC CT PCT: 2.3 % — AB (ref 0.7–2.1)

## 2018-07-04 ENCOUNTER — Other Ambulatory Visit: Payer: Self-pay

## 2018-07-04 ENCOUNTER — Ambulatory Visit
Admission: RE | Admit: 2018-07-04 | Discharge: 2018-07-04 | Disposition: A | Discharge status: Home / Self Care | Payer: Medicare Other | Source: Ambulatory Visit | Attending: Radiation Oncology | Admitting: Radiation Oncology

## 2018-07-04 ENCOUNTER — Inpatient Hospital Stay: Admission: RE | Admit: 2018-07-04 | Payer: Self-pay | Source: Ambulatory Visit

## 2018-07-04 DIAGNOSIS — Z170 Estrogen receptor positive status [ER+]: Secondary | ICD-10-CM | POA: Diagnosis not present

## 2018-07-04 DIAGNOSIS — C50511 Malignant neoplasm of lower-outer quadrant of right female breast: Secondary | ICD-10-CM | POA: Diagnosis not present

## 2018-07-04 DIAGNOSIS — Z510 Encounter for antineoplastic radiation therapy: Secondary | ICD-10-CM | POA: Diagnosis not present

## 2018-07-04 LAB — FOLATE RBC
FOLATE, HEMOLYSATE: 422.1 ng/mL
Folate, RBC: 1546 ng/mL (ref >498)
Hematocrit: 27.3 % — ABNORMAL LOW (ref 34.0–46.6)

## 2018-07-05 ENCOUNTER — Ambulatory Visit
Admission: RE | Admit: 2018-07-05 | Discharge: 2018-07-05 | Disposition: A | Discharge status: Home / Self Care | Payer: Medicare Other | Source: Ambulatory Visit | Attending: Radiation Oncology | Admitting: Radiation Oncology

## 2018-07-05 DIAGNOSIS — C50511 Malignant neoplasm of lower-outer quadrant of right female breast: Secondary | ICD-10-CM | POA: Diagnosis not present

## 2018-07-05 DIAGNOSIS — Z170 Estrogen receptor positive status [ER+]: Secondary | ICD-10-CM | POA: Diagnosis not present

## 2018-07-05 DIAGNOSIS — Z510 Encounter for antineoplastic radiation therapy: Secondary | ICD-10-CM | POA: Diagnosis not present

## 2018-07-05 LAB — METHYLMALONIC ACID, SERUM: METHYLMALONIC ACID, QUANTITATIVE: 266 nmol/L (ref 0–378)

## 2018-07-06 ENCOUNTER — Ambulatory Visit
Admission: RE | Admit: 2018-07-06 | Discharge: 2018-07-06 | Disposition: A | Discharge status: Home / Self Care | Payer: Medicare Other | Source: Ambulatory Visit | Attending: Radiation Oncology | Admitting: Radiation Oncology

## 2018-07-06 DIAGNOSIS — Z170 Estrogen receptor positive status [ER+]: Secondary | ICD-10-CM | POA: Diagnosis not present

## 2018-07-06 DIAGNOSIS — Z510 Encounter for antineoplastic radiation therapy: Secondary | ICD-10-CM | POA: Diagnosis not present

## 2018-07-06 DIAGNOSIS — C50511 Malignant neoplasm of lower-outer quadrant of right female breast: Secondary | ICD-10-CM | POA: Diagnosis not present

## 2018-07-09 ENCOUNTER — Ambulatory Visit: Payer: Medicare Other

## 2018-07-09 ENCOUNTER — Telehealth: Payer: Self-pay

## 2018-07-09 ENCOUNTER — Other Ambulatory Visit: Payer: Self-pay

## 2018-07-09 ENCOUNTER — Ambulatory Visit
Admission: RE | Admit: 2018-07-09 | Discharge: 2018-07-09 | Disposition: A | Discharge status: Home / Self Care | Payer: Medicare Other | Source: Ambulatory Visit | Attending: Radiation Oncology | Admitting: Radiation Oncology

## 2018-07-09 DIAGNOSIS — Z170 Estrogen receptor positive status [ER+]: Secondary | ICD-10-CM | POA: Diagnosis not present

## 2018-07-09 DIAGNOSIS — Z510 Encounter for antineoplastic radiation therapy: Secondary | ICD-10-CM | POA: Diagnosis not present

## 2018-07-09 DIAGNOSIS — C50511 Malignant neoplasm of lower-outer quadrant of right female breast: Secondary | ICD-10-CM | POA: Diagnosis not present

## 2018-07-09 NOTE — Telephone Encounter (Signed)
-----   Message from Truitt Merle, MD sent at 07/07/2018  1:58 PM EDT ----- Seth Bake, please call pt on Monday and let her know that her iron level is quite low, and I recommend her to start oral ferrous sulfate 2-3 tab daily, watch for constipation. Also let her know the iv iron option, and will do iv iron if she does not respond to oral iron. Please schedule lab and f/u with me in 3 weeks for anemia f/u. Thanks   Truitt Merle  07/07/2018

## 2018-07-09 NOTE — Telephone Encounter (Signed)
Spoke with patient son and made aware of information. Made sure the son Amy Roman wrote down the information and repeated back. He knows to call back with any questions or concerns. Message has been sent to scheduling for follow up lab and MD appointment in 3 weeks.

## 2018-07-10 ENCOUNTER — Ambulatory Visit
Admission: RE | Admit: 2018-07-10 | Discharge: 2018-07-10 | Disposition: A | Discharge status: Home / Self Care | Payer: Medicare Other | Source: Ambulatory Visit | Attending: Radiation Oncology | Admitting: Radiation Oncology

## 2018-07-10 ENCOUNTER — Encounter: Payer: Self-pay | Admitting: *Deleted

## 2018-07-10 DIAGNOSIS — Z510 Encounter for antineoplastic radiation therapy: Secondary | ICD-10-CM | POA: Diagnosis not present

## 2018-07-10 DIAGNOSIS — Z170 Estrogen receptor positive status [ER+]: Secondary | ICD-10-CM | POA: Diagnosis not present

## 2018-07-10 DIAGNOSIS — C50511 Malignant neoplasm of lower-outer quadrant of right female breast: Secondary | ICD-10-CM | POA: Diagnosis not present

## 2018-07-11 ENCOUNTER — Telehealth: Payer: Self-pay | Admitting: Hematology

## 2018-07-11 NOTE — Telephone Encounter (Signed)
Used interpreter line to LVM for pt regarding upcoming appts per 9/16 sch message

## 2018-07-13 ENCOUNTER — Encounter: Payer: Self-pay | Admitting: Radiation Oncology

## 2018-07-13 NOTE — Progress Notes (Signed)
  Radiation Oncology         [phone] ________________________________  Name: Amy Roman MRN: [id number]  Date: 07/13/2018  DOB: [birth year]  End of Treatment Note  Diagnosis:   StageIB RightBreast LOQ Invasive Ductal Carcinoma, ER(+)/ PR(+)/ Her2(-), Grade2    Cancer Staging Malignant neoplasm of 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, estrogen receptor positive ([place]) Staging form: Breast, AJCC 8th Edition - Clinical stage from 02/16/2018: Stage IB (cT2(m), cN0, cM0, G2, ER+, PR+, HER2-) - Signed by Truitt Merle, MD on 03/11/2018 - Pathologic: Stage IA (pT2, pN1a, cM0, G1, ER+, PR+, HER2-) - Signed by Truitt Merle, MD on 04/12/2018   Indication for treatment:  Curative       Radiation treatment dates:   05/28/2018-07/10/2018  Site/dose:   1. Right breast, 2 Gy x 25 fractions for a total dose of 50 Gy           2. Right SCV and PAB, 2 Gy x 25 fractions for a total dose of 50 Gy           3. Right breast boost, 2 Gy x 5 fractions for a total dose of 10 Gy   Beams/energy:   1. 3D, 10X//6X         2. 3D, 10X//6X         3. Electron, 15E  Narrative: The patient tolerated radiation treatment relatively well. At the beginning of her treatment, she denied any concerns. She was given radiaplex and instructed on its use. Towards the end of her treatment, she noted intermittent, sharp, shooting pain to her breast. She also had moderate fatigue (taking naps to aid with this), and hyperpigmentation to skin. She was noted to be using radiaplex gel 1-2 times per day. On PE, it noted hyperpigmentation to right chest and breast.   Plan: The patient has completed radiation treatment. The patient will return to radiation oncology clinic for routine followup in one month. I advised them to call or return sooner if they have any questions or concerns related to their recovery or treatment.  -----------------------------------  Eppie Gibson, MD  This document serves as a record of  services personally performed by Eppie Gibson, MD. It was created on her behalf by Steva Colder, a trained medical scribe. The creation of this record is based on the scribe's personal observations and the provider's statements to them. This document has been checked and approved by the attending provider.

## 2018-07-17 ENCOUNTER — Other Ambulatory Visit: Payer: Self-pay | Admitting: Medical

## 2018-07-17 ENCOUNTER — Inpatient Hospital Stay (HOSPITAL_BASED_OUTPATIENT_CLINIC_OR_DEPARTMENT_OTHER): Payer: Medicare Other | Admitting: Medical

## 2018-07-17 ENCOUNTER — Inpatient Hospital Stay: Payer: Medicare Other | Admitting: Lab

## 2018-07-17 ENCOUNTER — Telehealth: Payer: Self-pay

## 2018-07-17 ENCOUNTER — Inpatient Hospital Stay: Payer: Medicare Other

## 2018-07-17 VITALS — BP 116/79 | HR 62 | Temp 98.3°F | Resp 17 | Ht 61.0 in | Wt 182.7 lb

## 2018-07-17 DIAGNOSIS — R42 Dizziness and giddiness: Secondary | ICD-10-CM

## 2018-07-17 DIAGNOSIS — D509 Iron deficiency anemia, unspecified: Secondary | ICD-10-CM

## 2018-07-17 DIAGNOSIS — Z170 Estrogen receptor positive status [ER+]: Secondary | ICD-10-CM | POA: Diagnosis not present

## 2018-07-17 DIAGNOSIS — D649 Anemia, unspecified: Secondary | ICD-10-CM

## 2018-07-17 DIAGNOSIS — C50511 Malignant neoplasm of lower-outer quadrant of right female breast: Secondary | ICD-10-CM

## 2018-07-17 LAB — CBC WITH DIFFERENTIAL (CANCER CENTER ONLY)
Basophils Absolute: 0 10*3/uL (ref 0.0–0.1)
Basophils Relative: 0 %
EOS ABS: 0 10*3/uL (ref 0.0–0.5)
EOS PCT: 1 %
HEMATOCRIT: 25.9 % — AB (ref 34.8–46.6)
Hemoglobin: 8.2 g/dL — ABNORMAL LOW (ref 11.6–15.9)
LYMPHS ABS: 0.5 10*3/uL — AB (ref 0.9–3.3)
LYMPHS PCT: 13 %
MCH: 25.3 pg (ref 25.1–34.0)
MCHC: 31.5 g/dL (ref 31.5–36.0)
MCV: 80.5 fL (ref 79.5–101.0)
MONO ABS: 0.6 10*3/uL (ref 0.1–0.9)
Monocytes Relative: 16 %
Neutro Abs: 2.4 10*3/uL (ref 1.5–6.5)
Neutrophils Relative %: 70 %
Platelet Count: 252 10*3/uL (ref 145–400)
RBC: 3.22 MIL/uL — ABNORMAL LOW (ref 3.70–5.45)
RDW: 17.2 % — ABNORMAL HIGH (ref 11.2–14.5)
WBC Count: 3.4 10*3/uL — ABNORMAL LOW (ref 3.9–10.3)

## 2018-07-17 LAB — CMP (CANCER CENTER ONLY)
ALT: 15 U/L (ref 0–44)
AST: 24 U/L (ref 15–41)
Albumin: 3.5 g/dL (ref 3.5–5.0)
Alkaline Phosphatase: 103 U/L (ref 38–126)
Anion gap: 9 (ref 5–15)
BUN: 23 mg/dL (ref 8–23)
CHLORIDE: 104 mmol/L (ref 98–111)
CO2: 26 mmol/L (ref 22–32)
Calcium: 9.6 mg/dL (ref 8.9–10.3)
Creatinine: 0.98 mg/dL (ref 0.44–1.00)
GFR, EST NON AFRICAN AMERICAN: 58 mL/min — AB (ref >60)
GFR, Est AFR Am: >60 mL/min (ref >60)
GLUCOSE: 111 mg/dL — AB (ref 70–99)
Potassium: 4.5 mmol/L (ref 3.5–5.1)
Sodium: 139 mmol/L (ref 135–145)
TOTAL PROTEIN: 6.9 g/dL (ref 6.5–8.1)

## 2018-07-17 NOTE — Telephone Encounter (Signed)
Ms. Amy Roman's son called today and reported that his mother had fatigue, weakness, and that her heart was racing when she stood up and cancelled her radiation treatment. Dr. Isidore Moos asked that I schedule her to see Sandi Mealy PA today in the symptom management clinic. I called and spoke to [place] and he agreed to see her today at 2:00. I called her son and verified that he would be able to bring her at 2:00, he voiced that he will be able to, but would call me if he was if something changed. I gave him my direct number for contact.

## 2018-07-18 LAB — PREPARE RBC (CROSSMATCH)

## 2018-07-18 LAB — ABO/RH: ABO/RH(D): O POS

## 2018-07-19 IMAGING — NM NM BONE WHOLE BODY
2 series · 2 of 2 positions shown · non-contrast
Comparison: None

Radiographic correlation: Knee radiographs 02/26/2018

CLINICAL DATA: RIGHT breast cancer, initial staging

EXAM:
NUCLEAR MEDICINE WHOLE BODY BONE SCAN
TECHNIQUE: Whole body anterior and posterior images were obtained approximately
3 hours after intravenous injection of radiopharmaceutical.
RADIOPHARMACEUTICALS:  21.2 mCi Wechnetium-XXm MDP IV

[Series 1: wbr_bone_40 whole body · 2.66mm/px · 1 of 1 slices shown (1 of 2)]
[im 1/1]
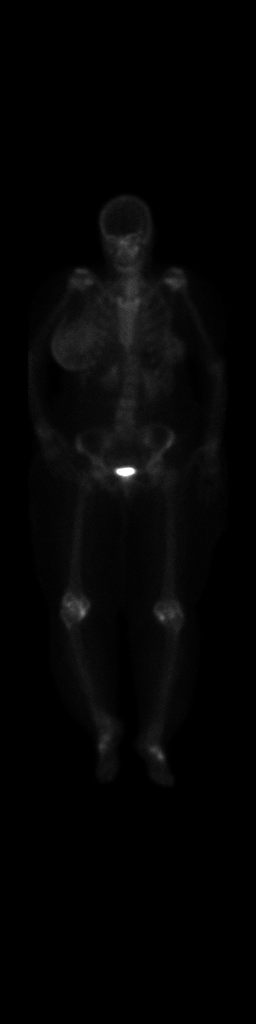

[Series 1: wbr_bone_40 whole body · 2.66mm/px · 1 of 1 slices shown (2 of 2)]
[im 1/1]
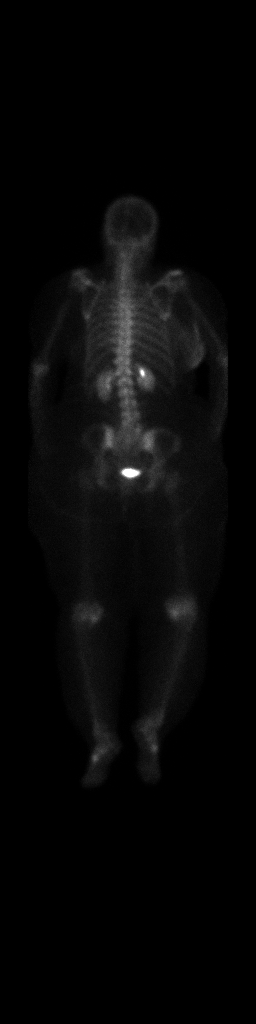

[2 of 2 positions shown; findings below may reference images not displayed]

FINDINGS: Mild uptake at the shoulders, knees and feet typically degenerative.

No foci of abnormal osseous tracer accumulation are identified which
are suspicious for osseous metastatic disease.

Uptake in the breasts bilaterally much greater on RIGHT, nonspecific
but on RIGHT is likely related to recent surgery.

Expected urinary tract distribution of tracer.
IMPRESSION: No scintigraphic evidence of osseous metastatic disease.

## 2018-07-19 NOTE — Progress Notes (Signed)
Symptoms Management Clinic Progress Note   Amy Roman [id number] May 15, 1951 67 y.o.  Amy Roman is managed by Dr. Truitt Merle and Dr. Eppie Gibson  Actively treated with chemotherapy/immunotherapy: no  Assessment: Plan:    Iron deficiency anemia, unspecified iron deficiency anemia type - Plan: Draw extra clot specimen  Symptomatic anemia - Plan: Practitioner attestation of consent, Complete patient signature process for consent form, Care order/instruction, Type and screen, Transfuse RBC, acetaminophen (TYLENOL) tablet 650 mg, diphenhydrAMINE (BENADRYL) capsule 25 mg, ABO/Rh, CANCELED: Prepare RBC   Iron deficiency anemia: I reviewed with the patient today foods that are high in iron and which she should be including in her diet.  Symptomatic anemia.  A CBC returned today with a hemoglobin of 8.2 and hematocrit of 25.9.  The patient has agreed to receive 2 units of packed red blood cells which will be given to her on 07/20/2018.  Please see After Visit Summary for patient specific instructions.  Future Appointments  Date Time Provider [place]  07/20/2018  8:45 AM [facility] INFUSION [facility] None  07/23/2018  8:30 AM [facility] LAB 3 [facility] None  07/23/2018  9:00 AM Alla Feeling, NP [facility] None  07/30/2018  4:00 PM [facility] DX DEXA 1 [facility] GI-BREAST CE  08/17/2018 11:20 AM Eppie Gibson, MD [facility] None  09/27/2018  9:00 AM [facility] LAB 3 [facility] None  09/27/2018  9:30 AM Truitt Merle, MD [facility] None  12/25/2018 10:00 AM Causey, Charlestine Massed, NP [facility] None    Orders Placed This Encounter  Procedures  . Draw extra clot specimen  . Type and screen  . ABO/Rh       Subjective:   Patient ID:  Amy Roman is a 67 y.o. (DOB 08-Apr-1951) female.  Chief Complaint: No chief complaint on file.   HPI Rosangelica A Roman is a 67 year old female with a history of a stage IA (pmT2N1aM0), ER+/PR+/HER2-, G1  malignant neoplasm of the lower outer quadrant of the right breast who was treated with a lumpectomy and adjuvant radiation therapy.  She is pending start of letrozole on 10/of 10/2017.  She she also has a history of iron deficiency anemia.  She presents to the clinic today with her son and interpreter.  The symptom management clinic was contacted by radiation oncology earlier today and asked to see the patient.  She reports that she has been having progressive fatigue.  She is having nausea at times.  She reports that she can hear her heartbeat in her ears.  She is jittery and shaky.  She has a history of some neuropathy in her feet which is greater on the left than the right.  She reports having occasional pain in her right breast where her surgery was.  She has a history of arthritis in her knees and reports that she needs a knee replacement but does not want surgery.  She states that she has been attempting to add foods in her diet that are higher in iron.  Medications: I have reviewed the patient's current medications.  Allergies: No Known Allergies  Past Medical History:  Diagnosis Date  . Anxiety and depression 10/15/2014  . Arthritis    BILATERAL KNEES, HAD CORTISONE INJECTION 03/2017  . Brain cancer ([place])   . Depression   . GERD (gastroesophageal reflux disease)    OCCASIONAL WITH CERTAIN FOOD  . Headache   . Seasonal allergies   . SVD (spontaneous vaginal delivery)    x 4  . Unspecified hereditary  and idiopathic peripheral neuropathy 05/14/2014   LEFT KNEE AND FOOT    Past Surgical History:  Procedure Laterality Date  . AXILLARY LYMPH NODE DISSECTION Right 04/17/2018   Procedure: AXILLARY LYMPH NODE DISSECTION;  Surgeon: Stark Klein, MD;  Location: [facility];  Service: General;  Laterality: Right;  . BREAST EXCISIONAL BIOPSY Right   . BREAST LUMPECTOMY  2007   RIGHT BREAST  . BREAST LUMPECTOMY WITH RADIOACTIVE SEED AND SENTINEL LYMPH NODE BIOPSY Right 04/05/2018     Procedure: RIGHT BREAST SEED BRACKETED LUMPECTOMY WITH SENTINEL LYMPH NODE BIOPSY;  Surgeon: Stark Klein, MD;  Location: [facility];  Service: General;  Laterality: Right;  . BREAST SURGERY     bx, no cancer   . CATARACT EXTRACTION Bilateral   . DILATATION & CURETTAGE/HYSTEROSCOPY WITH MYOSURE N/A 08/09/2017   Procedure: DILATATION & CURETTAGE/HYSTEROSCOPY WITH MYOSURE;  Surgeon: Emily Filbert, MD;  Location: [facility];  Service: Gynecology;  Laterality: N/A;  . LABIOPLASTY  03/06/2018   Procedure: LABIAPLASTY;  Surgeon: Emily Filbert, MD;  Location: [facility];  Service: Gynecology;;  . VAGINAL HYSTERECTOMY Bilateral 03/06/2018   Procedure: HYSTERECTOMY VAGINAL WITH BILATERAL SALPINGOOPHORECTOMY;  Surgeon: Emily Filbert, MD;  Location: [facility];  Service: Gynecology;  Laterality: Bilateral;    Family History  Problem Relation Age of Onset  . Diabetes Mother   . Neuropathy Mother   . Neuropathy Sister   . Cancer Sister        unknown type cancer in neck   . Other Father   . Cancer Maternal Aunt        leukemia   . Colon cancer Neg Hx   . Breast cancer Neg Hx     Social History   Socioeconomic History  . Marital status: Married    Spouse name: Not on file  . Number of children: 4  . Years of education: Not on file  . Highest education level: Not on file  Occupational History  . Occupation: house   Social Needs  . Financial resource strain: Not on file  . Food insecurity:    Worry: Not on file    Inability: Not on file  . Transportation needs:    Medical: Not on file    Non-medical: Not on file  Tobacco Use  . Smoking status: Never Smoker  . Smokeless tobacco: Never Used  Substance and Sexual Activity  . Alcohol use: No  . Drug use: No  . Sexual activity: Not on file  Lifestyle  . Physical activity:    Days per week: Not on file    Minutes per session: Not on file  . Stress: Not on file  Relationships  . Social connections:    Talks on phone: Not on file     Gets together: Not on file    Attends religious service: Not on file    Active member of club or organization: Not on file    Attends meetings of clubs or organizations: Not on file    Relationship status: Not on file  . Intimate partner violence:    Fear of current or ex partner: Not on file    Emotionally abused: Not on file    Physically abused: Not on file    Forced sexual activity: Not on file  Other Topics Concern  . Not on file  Social History Narrative   Original from [place]   Moved to the [place] 2000   Widow  She lives with son.  She had four sons.          Past Medical History, Surgical history, Social history, and Family history were reviewed and updated as appropriate.   Please see review of systems for further details on the patient's review from today.   Review of Systems:  Review of Systems  Constitutional: Positive for fatigue. Negative for appetite change, chills, diaphoresis and fever.  HENT: Negative for dental problem, mouth sores and trouble swallowing.   Respiratory: Negative for cough, chest tightness and shortness of breath.   Cardiovascular: Negative for chest pain and palpitations.       Episodic right lateral breast pain in her surgery site.  Gastrointestinal: Positive for nausea. Negative for constipation, diarrhea and vomiting.  Musculoskeletal: Positive for arthralgias.  Neurological: Negative for dizziness, syncope, weakness and headaches.    Objective:   Physical Exam:  BP 116/79 (BP Location: Left Arm, Patient Position: Sitting)   Pulse 62   Temp 98.3 F (36.8 C) (Oral)   Resp 17   Ht _0  (1.549 m)   Wt 182 lb 11.2 oz (82.9 kg)   SpO2 100%   BMI 34.52 kg/m  ECOG: 1  Physical Exam  Constitutional: No distress.  HENT:  Head: Normocephalic and atraumatic.  Eyes: Conjunctivae are normal. Right eye exhibits no discharge. Left eye exhibits no discharge. No scleral icterus.  Cardiovascular: Normal rate, regular rhythm and  normal heart sounds. Exam reveals no gallop and no friction rub.  No murmur heard. Pulmonary/Chest: Effort normal and breath sounds normal. No respiratory distress. She has no wheezes. She has no rales.  Neurological: She is alert. Coordination normal.  Skin: Skin is warm and dry. No rash noted. She is not diaphoretic. No erythema.  Psychiatric: She has a normal mood and affect. Her behavior is normal. Judgment and thought content normal.    Lab Review:     Component Value Date/Time   NA 139 07/17/2018 1427   K 4.5 07/17/2018 1427   CL 104 07/17/2018 1427   CO2 26 07/17/2018 1427   GLUCOSE 111 (H) 07/17/2018 1427   BUN 23 07/17/2018 1427   CREATININE 0.98 07/17/2018 1427   CALCIUM 9.6 07/17/2018 1427   PROT 6.9 07/17/2018 1427   ALBUMIN 3.5 07/17/2018 1427   AST 24 07/17/2018 1427   ALT 15 07/17/2018 1427   ALKPHOS 103 07/17/2018 1427   BILITOT <0.2 (L) 07/17/2018 1427   GFRNONAA 58 (L) 07/17/2018 1427   GFRAA >60 07/17/2018 1427       Component Value Date/Time   WBC 3.4 (L) 07/17/2018 1427   WBC 6.3 03/07/2018 0543   RBC 3.22 (L) 07/17/2018 1427   HGB 8.2 (L) 07/17/2018 1427   HCT 25.9 (L) 07/17/2018 1427   HCT 27.3 (L) 07/03/2018 0948   PLT 252 07/17/2018 1427   MCV 80.5 07/17/2018 1427   MCH 25.3 07/17/2018 1427   MCHC 31.5 07/17/2018 1427   RDW 17.2 (H) 07/17/2018 1427   LYMPHSABS 0.5 (L) 07/17/2018 1427   MONOABS 0.6 07/17/2018 1427   EOSABS 0.0 07/17/2018 1427   BASOSABS 0.0 07/17/2018 1427   -------------------------------  Imaging from last 24 hours (if applicable):  Radiology interpretation: No results found.

## 2018-07-20 ENCOUNTER — Inpatient Hospital Stay: Payer: Medicare Other

## 2018-07-20 ENCOUNTER — Other Ambulatory Visit: Payer: Self-pay

## 2018-07-21 LAB — TYPE AND SCREEN
ABO/RH(D): O POS
Antibody Screen: NEGATIVE
UNIT DIVISION: 0
UNIT DIVISION: 0

## 2018-07-21 LAB — BPAM RBC
Blood Product Expiration Date: 201910222359
Blood Product Expiration Date: 201910222359
Unit Type and Rh: 5100
Unit Type and Rh: 5100

## 2018-07-22 NOTE — Progress Notes (Signed)
Amy Roman  [phone] [phone]  Clinic Follow up Note   Patient Care Team: Lin Landsman, MD as PCP - General (Family Medicine) Alda Berthold, DO as Consulting Physician (Neurology) Truitt Merle, MD as Consulting Physician (Hematology) Stark Klein, MD as Consulting Physician (General Surgery) Eppie Gibson, MD as Attending Physician (Radiation Oncology) 07/23/2018  SUMMARY OF ONCOLOGIC HISTORY: Oncology History   Cancer Staging Malignant neoplasm of 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, estrogen receptor positive ([place]) Staging form: Breast, AJCC 8th Edition - Clinical stage from 02/16/2018: Stage IB (cT2(m), cN0, cM0, G2, ER+, PR+, HER2-) - Signed by Truitt Merle, MD on 03/11/2018      Malignant neoplasm of 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, estrogen receptor positive ([place])   01/30/2018 Mammogram    Screening mammogram FINDINGS: In the right breast, possible distortion warrants further evaluation. The patient has had an excisional biopsy of her right breast upper outer quadrant in 2007, however this distortion is either better seen or new from her prior mammogram, and it does not quite match the surgical site as marked by a skin scar marker. In the left breast, no findings suspicious for malignancy.    02/14/2018 Mammogram    Diagnostic mammogram: In the lateral aspect of the right breast, posterior depth there is a prominent area of distortion at the approximate 9 o'clock location. While this is in the region of the excisional biopsy that the patient had in 2007, the distortion has become significantly more prominent as compared to the tomosynthesis mammogram performed in September of 2016, and appears to be inferior to the surgical scar.    02/14/2018 Breast US    Ultrasound of the right breast at 8:30, 4 cm from the nipple demonstrates an irregular hypoechoic vascular mass with spiculated margins measuring 2.1 x 2.0 x 1.8  cm. There is a small adjacent mass at 9 o'clock, 4 cm from the nipple, which is 1.2 cm away from the dominant mass. The smaller mass measures 1.1 x 0.7 x 0.7 cm. Together, the 2 masses span 3.6 cm of tissue. Ultrasound of the right axilla demonstrates multiple normal-appearing lymph nodes.  IMPRESSION: 1. There is a highly suspicious mass in the right breast at 830 with a small possible satellite lesion at 9 o'clock. 2.  No evidence of right axillary lymphadenopathy.    02/16/2018 Cancer Staging    Staging form: Breast, AJCC 8th Edition - Clinical stage from 02/16/2018: Stage IB (cT2(m), cN0, cM0, G2, ER+, PR+, HER2-) - Signed by Truitt Merle, MD on 03/11/2018    02/16/2018 Initial Biopsy    Diagnosis 1. Breast, right, needle core biopsy, 8:30 o'clock - INVASIVE DUCTAL CARCINOMA, SEE COMMENT. 2. Breast, right, needle core biopsy, 9 o'clock - INVASIVE DUCTAL CARCINOMA. - LOBULAR NEOPLASIA (ATYPICAL LOBULAR HYPERPLASIA).  ER: 95%, positive, strong staining PR: 80%, positive, moderate staining  Proliferation Marker Ki67: 2%  HER2 - Negative Ratio of HER2/CEP17 signals: 1.80 Average HER2 copy number per cell: 2.97     03/01/2018 Imaging    MR Bilateral breast IMPRESSION: Two areas of known malignancy in the LATERAL portion of the RIGHT breast. No additional areas of concern in either breast. No axillary adenopathy.    03/11/2018 Initial Diagnosis    Malignant neoplasm of 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, estrogen receptor positive ([place])    04/05/2018 Surgery    RIGHT BREAST SEED BRACKETED LUMPECTOMY WITH SENTINEL LYMPH NODE BIOPSY by Dr. Barry Dienes 04/05/18    04/05/2018  Pathology Results    Surgery  Diagnosis 04/05/18  1. Breast, lumpectomy, right - INVASIVE DUCTAL CARCINOMA GRADE I/III, TWO FOCI SPANNING 2.9 CM AND 0.9 CM. - LOBULAR NEOPLASIA (LOBULAR CARCINOMA IN SITU). - DUCTAL CARCINOMA IN SITU, LOW GRADE. - LYMPHOVASCULAR INVASION IS IDENTIFIED. - THE SURGICAL RESECTION  MARGINS ARE NEGATIVE FOR DUCTAL CARCINOMA. - SEE ONCOLOGY TABLE BELOW. 2. Lymph node, sentinel, biopsy, right - THERE IS NO EVIDENCE OF CARCINOMA IN 1 OF 1 LYMPH NODE (0/1). 3. Lymph node, sentinel, biopsy, right - METASTATIC CARCINOMA IN 1 OF 1 LYMPH NODE (1/1) WITH EXTRACAPSULAR EXTENSION. - EXTRACAPSULAR EXTENSION EXTENDS 0.3 CM. 4. Lymph node, sentinel, biopsy, right - DUCTAL CARCINOMA. - PERINEURAL INVASION IS IDENTIFIED. - SEE COMMENT. 5. Lymph node, sentinel, biopsy, right 1 of 4    04/05/2018 Miscellaneous    Mammaprint Low Risk with 10% risk of recurrance with Tamoxifen alone  MPI at +0.240 DMFI at 97.8%    04/12/2018 Cancer Staging    Staging form: Breast, AJCC 8th Edition - Pathologic: Stage IA (pT2, pN1a, cM0, G1, ER+, PR+, HER2-) - Signed by Truitt Merle, MD on 04/12/2018    04/13/2018 Imaging    Whole Body Bone scan 04/13/18 IMPRESSION: No scintigraphic evidence of osseous metastatic disease.    04/16/2018 Imaging    CT CAP W Contrast 04/16/18  IMPRESSION: 1. No definite findings of metastatic disease in the chest. Solitary solid 4 mm right lower lobe pulmonary nodule, for which initial chest CT follow-up is advised in 3 months. 2. Postsurgical changes from right breast lumpectomy and right axillary node dissection. Thin-walled fluid collections in the right breast and right axilla are most compatible with postsurgical seromas. 3. No evidence of metastatic disease in the abdomen or pelvis.     04/17/2018 Surgery    AXILLARY LYMPH NODE DISSECTION by Dr. Barry Dienes 04/17/18      04/17/2018 Pathology Results    Diagnosis 04/17/18  Lymph nodes, regional resection, Right Axillary - BENIGN FIBROADIPOSE AND BREAST TISSUE WITH FAT NECROSIS. - NO LYMPHOID TISSUE PRESENT. - NO MALIGNANCY IDENTIFIED. Microscopic Comment The entire specimen was submitted.    05/28/2018 - 07/10/2018 Radiation Therapy    Radiation treatment dates:   05/28/2018-07/10/2018  Site/dose:    1. Right  breast, 2 Gy in 25 fractions for a total dose of 50 Gy 2. Right SCV, 2 Gy in 25 fractions for a total dose of 50 Gy 3. Right breast boost, 2 Gy in 5 fractions for a total dose of 5 Gy           CURRENT THERAPY: adjuvant letrozole to begin 10/1   INTERVAL HISTORY: Amy Roman returns for lab and f/u as scheduled. She was found to be iron deficient and recommended to start oral iron BID on 07/03/18. She has taken 1 tab daily for last 4 days. She was seen in [facility] by Sandi Mealy, PA on 9/24 per request of radiation oncology for nausea and progressive fatigue. Hgb was 8.2. She was recommended to have blood transfusion which she declined. She prefers to try improving iron in her diet first. She prefers mostly vegetarian diet. She has constipation, no BM in 4-5 days. Denies bloody or black stools.   For her anemia work up on 9/10, MMA, P53, and folic acid are normal. She has never had colonoscopy or endoscopy. She denies prior history of anemia, did take iron while pregnant.   Her energy level fluctuates as well as her appetite. For last 3 days she has felt  well. Nausea is improving. She has bitter taste in her mouth. She has occasional dry cough. Denies fever, chills, chest pain, or dyspnea. She can sense her heart beating in her ears when she exerts herself and gets tired. Her skin remains dark and peeling; completed radiation 9/17.    MEDICAL HISTORY:  Past Medical History:  Diagnosis Date  . Anxiety and depression 10/15/2014  . Arthritis    BILATERAL KNEES, HAD CORTISONE INJECTION 03/2017  . Brain cancer ([place])   . Depression   . GERD (gastroesophageal reflux disease)    OCCASIONAL WITH CERTAIN FOOD  . Headache   . Seasonal allergies   . SVD (spontaneous vaginal delivery)    x 4  . Unspecified hereditary and idiopathic peripheral neuropathy 05/14/2014   LEFT KNEE AND FOOT    SURGICAL HISTORY: Past Surgical History:  Procedure Laterality Date  . AXILLARY LYMPH NODE DISSECTION Right  04/17/2018   Procedure: AXILLARY LYMPH NODE DISSECTION;  Surgeon: Stark Klein, MD;  Location: [facility];  Service: General;  Laterality: Right;  . BREAST EXCISIONAL BIOPSY Right   . BREAST LUMPECTOMY  2007   RIGHT BREAST  . BREAST LUMPECTOMY WITH RADIOACTIVE SEED AND SENTINEL LYMPH NODE BIOPSY Right 04/05/2018   Procedure: RIGHT BREAST SEED BRACKETED LUMPECTOMY WITH SENTINEL LYMPH NODE BIOPSY;  Surgeon: Stark Klein, MD;  Location: [facility];  Service: General;  Laterality: Right;  . BREAST SURGERY     bx, no cancer   . CATARACT EXTRACTION Bilateral   . DILATATION & CURETTAGE/HYSTEROSCOPY WITH MYOSURE N/A 08/09/2017   Procedure: DILATATION & CURETTAGE/HYSTEROSCOPY WITH MYOSURE;  Surgeon: Emily Filbert, MD;  Location: [facility];  Service: Gynecology;  Laterality: N/A;  . LABIOPLASTY  03/06/2018   Procedure: LABIAPLASTY;  Surgeon: Emily Filbert, MD;  Location: [facility];  Service: Gynecology;;  . VAGINAL HYSTERECTOMY Bilateral 03/06/2018   Procedure: HYSTERECTOMY VAGINAL WITH BILATERAL SALPINGOOPHORECTOMY;  Surgeon: Emily Filbert, MD;  Location: [facility];  Service: Gynecology;  Laterality: Bilateral;    I have reviewed the social history and family history with the patient and they are unchanged from previous note.  ALLERGIES:  has No Known Allergies.  MEDICATIONS:  Current Outpatient Medications  Medication Sig Dispense Refill  . b complex vitamins tablet Take 1 tablet by mouth daily.    Marland Kitchen FLUoxetine (PROZAC) 40 MG capsule Take 40 mg by mouth daily.    Marland Kitchen gabapentin (NEURONTIN) 800 MG tablet Take 200 mg by mouth 4 (four) times daily as needed.     Marland Kitchen letrozole (FEMARA) 2.5 MG tablet Take 1 tablet (2.5 mg total) by mouth daily. 30 tablet 2  . ondansetron (ZOFRAN) 8 MG tablet Take 1 tablet (8 mg total) by mouth every 8 (eight) hours as needed for nausea or vomiting. 20 tablet 0  . promethazine (PHENERGAN) 25 MG tablet Take 1 tablet (25 mg total) by mouth every 6 (six)  hours as needed for nausea or vomiting. 30 tablet 2  . traZODone (DESYREL) 100 MG tablet Take 100 mg by mouth at bedtime.    . ranitidine (ZANTAC) 150 MG tablet Take 150 mg by mouth 2 (two) times daily.     No current facility-administered medications for this visit.     PHYSICAL EXAMINATION: ECOG PERFORMANCE STATUS: 1 - Symptomatic but completely ambulatory  Vitals:   07/23/18 0927  BP: (!) 114/53  Pulse: 75  Resp: 18  Temp: 97.7 F (36.5 C)  SpO2: 100%   Filed Weights  07/23/18 0927  Weight: 189 lb 3.2 oz (85.8 kg)    GENERAL:alert, no distress and comfortable SKIN: no rashes or significant lesions EYES: sclera clear OROPHARYNX:no thrush or ulcers LUNGS: clear to auscultation with normal breathing effort HEART: regular rate & rhythm  ABDOMEN:abdomen soft, non-tender and normal bowel sounds Musculoskeletal:no cyanosis of digits and no clubbing  NEURO: alert & oriented x 3 with fluent speech, no focal motor/sensory deficits Breast exam: limited exam reveals hyperpigmentation to right breast with small area of superficial peeling under her breast on skin fold. No erythema or open wound. No palpable masses. Left breast not examined   LABORATORY DATA:  I have reviewed the data as listed CBC Latest Ref Rng & Units 07/23/2018 07/17/2018 07/03/2018  WBC 3.9 - 10.3 K/uL 4.6 3.4(L) 2.7(L)  Hemoglobin 11.6 - 15.9 g/dL 8.0(L) 8.2(L) 8.3(L)  Hematocrit 34.8 - 46.6 % 26.9(L) 25.9(L) 27.3(L)  Platelets 145 - 400 K/uL 261 252 244     CMP Latest Ref Rng & Units 07/17/2018 06/28/2018 04/12/2018  Glucose 70 - 99 mg/dL 111(H) 94 95  BUN 8 - 23 mg/dL 23 16 12  Creatinine 0.44 - 1.00 mg/dL 0.98 0.84 0.96  Sodium 135 - 145 mmol/L 139 139 141  Potassium 3.5 - 5.1 mmol/L 4.5 4.7 4.7  Chloride 98 - 111 mmol/L 104 106 106  CO2 22 - 32 mmol/L 26 24 27  Calcium 8.9 - 10.3 mg/dL 9.6 9.5 10.0  Total Protein 6.5 - 8.1 g/dL 6.9 6.9 -  Total Bilirubin 0.3 - 1.2 mg/dL <0.2(L) 0.3 -  Alkaline Phos 38  - 126 U/L 103 97 -  AST 15 - 41 U/L 24 23 -  ALT 0 - 44 U/L 15 14 -   PATHOLOGY:  Surgery  Diagnosis 04/05/18  1. Breast, lumpectomy, right - INVASIVE DUCTAL CARCINOMA GRADE I/III, TWO FOCI SPANNING 2.9 CM AND 0.9 CM. - LOBULAR NEOPLASIA (LOBULAR CARCINOMA IN SITU). - DUCTAL CARCINOMA IN SITU, LOW GRADE. - LYMPHOVASCULAR INVASION IS IDENTIFIED. - THE SURGICAL RESECTION MARGINS ARE NEGATIVE FOR DUCTAL CARCINOMA. - SEE ONCOLOGY TABLE BELOW. 2. Lymph node, sentinel, biopsy, right - THERE IS NO EVIDENCE OF CARCINOMA IN 1 OF 1 LYMPH NODE (0/1). 3. Lymph node, sentinel, biopsy, right - METASTATIC CARCINOMA IN 1 OF 1 LYMPH NODE (1/1) WITH EXTRACAPSULAR EXTENSION. - EXTRACAPSULAR EXTENSION EXTENDS 0.3 CM. 4. Lymph node, sentinel, biopsy, right - DUCTAL CARCINOMA. - PERINEURAL INVASION IS IDENTIFIED. - SEE COMMENT. 5. Lymph node, sentinel, biopsy, right 1 of 4 Amended copy Corrected FINAL for Roman, Adean A ([id number]) Diagnosis(continued) - METASTATIC CARCINOMA IN 1 OF 1 LYMPH NODE (1/1), WITH EXTRACAPSULAR EXTENSION. - EXTRACAPSULAR EXTENSION EXTENDS 0.7 CM. 6. Lymph node, sentinel, biopsy, right - METASTATIC CARCINOMA IN 1 OF 1 LYMPH NODE (1/1), WITH EXTRACAPSULAR EXTENSION. - EXTRACAPSULAR EXTENSION EXTENDS 0.2 CM. Microscopic Comment 1. INVASIVE CARCINOMA OF THE BREAST: Resection Procedure: Lumpectomy. Specimen Laterality: Right. Tumor Size: 2.9 cm and 0.9 cm (gross measurements). Histologic Type: Ductal. Histologic Grade: I. Glandular (Acinar)/Tubular Differentiation: 3. Nuclear Pleomorphism: 1. Mitotic Rate: 1. Overall Grade: I/III. Ductal Carcinoma In Situ: Present, low grade. Tumor Extension: Confined to breast parenchyma. Margins: Distance from closest margin (millimeters): Greater than 0.2 cm to all margins. DCIS Margins: Distance from closest margin (millimeters): Greater than 0.2 cm to all margins. Regional Lymph Nodes: Number of Lymph Nodes  Examined: 4. Number of Sentinel Nodes Examined (if applicable): 4. Number of Lymph Nodes with Macrometastases (>2 mm): 3. Number of Lymph Nodes with Micrometastases: 0. Number   of Lymph Nodes with Isolated Tumor Cells (?0.2 mm or ?200 cells)#: 0. Size of Largest Metastatic Deposit (millimeters): 9 mm. Extranodal Extension: Present, extensive. Treatment Effect: No known presurgical therapy. Breast Biomarker Testing Performed on Previous Biopsy: Yes. Testing Performed on Case Number: [id number]. Estrogen Receptor: 95%, strong. Progesterone Receptor: 80%, strong. HER2: No amplification was detected. The ratio was 1.80. Ki-67: 2%. Representative tumor block: 1D. Pathologic Stage Classification (pTNM, AJCC 8th Edition): mpT2, pN1c. (v4.2.0.0) 4. Lymph nodal tissue is not definitively identified in part 4.    Diagnosis, 03/06/2018 Uterus, ovaries and fallopian tubes, cervix CERVIX: - NABOTHIAN CYSTS AND SQUAMOUS METAPLASIA. - NO DYSPLASIA OR MALIGNANCY. ENDOMETRIUM: - INACTIVE ENDOMETRIUM. - NO HYPERPLASIA OR MALIGNANCY. - SEE MICROSCOPIC DESCRIPTION. MYOMETRIUM: - UNREMARKABLE. - NO EVIDENCE OF MALIGNANCY. UTERINE SEROSA: - UNREMARKABLE. - NO ENDOMETRIOSIS OR MALIGNANCY. RIGHT AND LEFT OVARIES: - UNREMARKABLE. - NO ENDOMETRIOSIS OR MALIGNANCY. RIGHT AND LEFT FALLOPIAN TUBES: - BENIGN PARATUBAL CYST. - NO ENDOMETRIOSIS OR MALIGNANCY.  Diagnosis, 02/16/2018 1. Breast, right, needle core biopsy, 8:30 o'clock - INVASIVE DUCTAL CARCINOMA, SEE COMMENT. 2. Breast, right, needle core biopsy, 9 o'clock - INVASIVE DUCTAL CARCINOMA. - LOBULAR NEOPLASIA (ATYPICAL LOBULAR HYPERPLASIA).       RADIOGRAPHIC STUDIES: I have personally reviewed the radiological images as listed and agreed with the findings in the report. No results found.   ASSESSMENT & PLAN: Amy Roman is a 67 y.o. African female with a history of Arthritis and neuropathy  1.  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 Roman appears stable. She completed adjuvant radiation. She tolerated moderately well. She will begin adjuvant letrozole this week, she will pick up from the pharmacy today to begin 10/1.  -we again reviewed potential side effects of AI. Will monitor her closely for side effects.   2. Lung nodules -Found on 04/16/18 CT scan; Dr. Feng did not suspect this is related to her cancer but will monitored in the future .   3. Bone Health  -DEXA scheduled for 10/7   4. Osteoarthritis -She had significant arthritis in her b/l knees -I reviewed AI use may effect her joint pain. Will monitor while on medication.  -she takes gabapentin PRN for pain   5. Anxiety, depression -stable on fluoxetine, trazadone   6. Iron deficiency anemia -iron 22, TIBC 505, 4% sat, and ferritin 9, Hgb 8.2 on 07/03/18, normal B12, MMA, and folate - consistent with iron deficiency anemia  -she was previously advised to take oral iron BID but has been taking 1 tab daily x4 days; I recommend she increase to 1 tab BID  -She declined RBC transfusion  -given her worsening anemia with Hgb 8.0, will arrange IV Feraheme this week and next week. We reviewed possible side effects such as hypersensitivity reaction including anaphylaxis. She agrees to proceed.  -we reviewed if she does not respond well to IV Feraheme, she may require blood transfusion, she understands.  -The source of her iron deficiency is know clear, will provide her with stool card today and refer to GI for colonoscopy. She agrees.  -I reviewed to monitor constipation with increased iron dose, she plans to use milk of mag PRN -will check labs in 2 weeks, f/u in 4 weeks; the above plan was discussed with Dr. Feng.   Plan:  -Labs reviewed -IV Feraheme weekly x2 starting this week -monitor constipation, milk of mag PRN    -Stool card provided  -Refer   to GI for colonoscopy  -Lab in 2 and 4 weeks, f/u in 4 weeks  -Begin adjuvant letrozole 10/1    Orders Placed This Encounter  Procedures  . Occult blood card to lab, stool    Standing Status:   Future    Standing Expiration Date:   07/24/2019  . Occult blood card to lab, stool    Standing Status:   Future    Standing Expiration Date:   07/24/2019  . Occult blood card to lab, stool    Standing Status:   Future    Standing Expiration Date:   07/24/2019  . Ambulatory referral to Gastroenterology    Referral Priority:   Routine    Referral Type:   Consultation    Referral Reason:   Specialty Services Required    Number of Visits Requested:   1   All questions were answered. The patient knows to call the clinic with any problems, questions or concerns. No barriers to learning was detected. I spent 20 minutes counseling the patient face to face. The total time spent in the appointment was 25 minutes and more than 50% was on counseling and review of test results     Alla Feeling, NP 07/23/18

## 2018-07-23 ENCOUNTER — Inpatient Hospital Stay (HOSPITAL_BASED_OUTPATIENT_CLINIC_OR_DEPARTMENT_OTHER): Payer: Medicare Other | Admitting: Nurse Practitioner

## 2018-07-23 ENCOUNTER — Encounter: Payer: Self-pay | Admitting: Gastroenterology

## 2018-07-23 ENCOUNTER — Telehealth: Payer: Self-pay | Admitting: Hematology

## 2018-07-23 ENCOUNTER — Inpatient Hospital Stay: Payer: Medicare Other

## 2018-07-23 ENCOUNTER — Encounter: Payer: Self-pay | Admitting: Nurse Practitioner

## 2018-07-23 VITALS — BP 114/53 | HR 75 | Temp 97.7°F | Resp 18 | Ht 61.0 in | Wt 189.2 lb

## 2018-07-23 DIAGNOSIS — D509 Iron deficiency anemia, unspecified: Secondary | ICD-10-CM

## 2018-07-23 DIAGNOSIS — C50511 Malignant neoplasm of lower-outer quadrant of right female breast: Secondary | ICD-10-CM | POA: Diagnosis not present

## 2018-07-23 DIAGNOSIS — Z170 Estrogen receptor positive status [ER+]: Secondary | ICD-10-CM | POA: Diagnosis not present

## 2018-07-23 LAB — CBC WITH DIFFERENTIAL (CANCER CENTER ONLY)
BASOS ABS: 0 10*3/uL (ref 0.0–0.1)
BASOS PCT: 1 %
EOS PCT: 3 %
Eosinophils Absolute: 0.2 10*3/uL (ref 0.0–0.5)
HCT: 26.9 % — ABNORMAL LOW (ref 34.8–46.6)
Hemoglobin: 8 g/dL — ABNORMAL LOW (ref 11.6–15.9)
Lymphocytes Relative: 13 %
Lymphs Abs: 0.6 10*3/uL — ABNORMAL LOW (ref 0.9–3.3)
MCH: 25 pg — ABNORMAL LOW (ref 25.1–34.0)
MCHC: 29.7 g/dL — ABNORMAL LOW (ref 31.5–36.0)
MCV: 84.1 fL (ref 79.5–101.0)
MONO ABS: 0.6 10*3/uL (ref 0.1–0.9)
Monocytes Relative: 13 %
NEUTROS ABS: 3.2 10*3/uL (ref 1.5–6.5)
Neutrophils Relative %: 70 %
PLATELETS: 261 10*3/uL (ref 145–400)
RBC: 3.2 MIL/uL — ABNORMAL LOW (ref 3.70–5.45)
RDW: 16.5 % — ABNORMAL HIGH (ref 11.2–14.5)
WBC: 4.6 10*3/uL (ref 3.9–10.3)

## 2018-07-23 NOTE — Telephone Encounter (Signed)
Gave pt avs and calendar  °

## 2018-07-24 ENCOUNTER — Inpatient Hospital Stay: Payer: Medicare Other | Attending: Nurse Practitioner

## 2018-07-24 VITALS — BP 113/64 | HR 62 | Temp 97.8°F | Resp 17

## 2018-07-24 DIAGNOSIS — D509 Iron deficiency anemia, unspecified: Secondary | ICD-10-CM | POA: Diagnosis not present

## 2018-07-24 DIAGNOSIS — C50511 Malignant neoplasm of lower-outer quadrant of right female breast: Secondary | ICD-10-CM | POA: Diagnosis not present

## 2018-07-24 MED ORDER — SODIUM CHLORIDE 0.9 % IV SOLN
510.0000 mg | Freq: Once | INTRAVENOUS | Status: AC
  Administered 2018-07-24: 510 mg via INTRAVENOUS
  Filled 2018-07-24: qty 17

## 2018-07-24 MED ORDER — SODIUM CHLORIDE 0.9 % IV SOLN
Freq: Once | INTRAVENOUS | Status: AC
  Administered 2018-07-24: 10:00:00 via INTRAVENOUS
  Filled 2018-07-24: qty 250

## 2018-07-24 NOTE — Patient Instructions (Signed)

## 2018-07-26 ENCOUNTER — Ambulatory Visit (INDEPENDENT_AMBULATORY_CARE_PROVIDER_SITE_OTHER): Payer: Medicare Other | Admitting: Gastroenterology

## 2018-07-26 ENCOUNTER — Encounter: Payer: Self-pay | Admitting: Gastroenterology

## 2018-07-26 VITALS — BP 110/68 | HR 60 | Ht 61.0 in | Wt 188.4 lb

## 2018-07-26 DIAGNOSIS — K5900 Constipation, unspecified: Secondary | ICD-10-CM

## 2018-07-26 DIAGNOSIS — D509 Iron deficiency anemia, unspecified: Secondary | ICD-10-CM

## 2018-07-26 MED ORDER — NA SULFATE-K SULFATE-MG SULF 17.5-3.13-1.6 GM/177ML PO SOLN: 1.0000 | Freq: Once | ORAL | 0 refills | Status: AC

## 2018-07-26 NOTE — Patient Instructions (Addendum)
Per your request to have a female gastroenterologist continue your ongoing care, we have scheduled your procedures with Dr. Silverio Decamp.  You have been scheduled for an endoscopy and colonoscopy. Please follow the written instructions given to you at your visit today. Please pick up your prep supplies at the pharmacy within the next 1-3 days. If you use inhalers (even only as needed), please bring them with you on the day of your procedure. Your physician has requested that you go to www.[email or web address] and enter the access code given to you at your visit today. This web site gives a general overview about your procedure. However, you should still follow specific instructions given to you by our office regarding your preparation for the procedure.  Thank you for choosing me and [facility].  Pricilla Riffle. Dagoberto Ligas., MD., Marval Regal

## 2018-07-26 NOTE — Progress Notes (Signed)
History of Present Illness: This is a 67 year old female referred by Alla Feeling, NP for the evaluation of iron deficiency anemia.  She is accompanied by 2 family members.  Hb=8, ferritin=9, 4% sat. She has a history of right breast cancer and has completed adjuvant radiation.  Hemoccult cards previously provided but have not been returned.  She relates a history of worsening vaginal bleeding for about 1 year that led to a hysterectomy and bilateral salpingo-oophorectomy in May 2019.  She has chronic constipation with a bowel movement about every 3 to 4 days.  She has had intermittent difficulties with anorexia and nausea for the past several months.  No prior colonoscopy or EGD.  Denies weight loss, abdominal pain, diarrhea, change in stool caliber, melena, hematochezia, vomiting, dysphagia, reflux symptoms, chest pain.     No Known Allergies Outpatient Medications Prior to Visit  Medication Sig Dispense Refill  . b complex vitamins tablet Take 1 tablet by mouth daily.    Marland Kitchen bismuth subsalicylate (PEPTO BISMOL) 262 MG chewable tablet Chew 524 mg by mouth as needed.    . ferrous sulfate 325 (65 FE) MG EC tablet Take 325 mg by mouth 3 (three) times daily with meals.    Marland Kitchen FLUoxetine (PROZAC) 40 MG capsule Take 40 mg by mouth daily.    Marland Kitchen gabapentin (NEURONTIN) 800 MG tablet Take 200 mg by mouth 4 (four) times daily as needed.     Marland Kitchen letrozole (FEMARA) 2.5 MG tablet Take 1 tablet (2.5 mg total) by mouth daily. 30 tablet 2  . traZODone (DESYREL) 100 MG tablet Take 100 mg by mouth at bedtime.    . ondansetron (ZOFRAN) 8 MG tablet Take 1 tablet (8 mg total) by mouth every 8 (eight) hours as needed for nausea or vomiting. 20 tablet 0  . promethazine (PHENERGAN) 25 MG tablet Take 1 tablet (25 mg total) by mouth every 6 (six) hours as needed for nausea or vomiting. 30 tablet 2  . ranitidine (ZANTAC) 150 MG tablet Take 150 mg by mouth 2 (two) times daily.     No facility-administered medications  prior to visit.    Past Medical History:  Diagnosis Date  . Anemia   . Anxiety and depression 10/15/2014  . Arthritis    BILATERAL KNEES, HAD CORTISONE INJECTION 03/2017  . Brain cancer ([facility] [facility])   . Depression   . GERD (gastroesophageal reflux disease)    OCCASIONAL WITH CERTAIN FOOD  . Headache   . Seasonal allergies   . SVD (spontaneous vaginal delivery)    x 4  . Unspecified hereditary and idiopathic peripheral neuropathy 05/14/2014   LEFT KNEE AND FOOT   Past Surgical History:  Procedure Laterality Date  . AXILLARY LYMPH NODE DISSECTION Right 04/17/2018   Procedure: AXILLARY LYMPH NODE DISSECTION;  Surgeon: Fareed Fung Klein, MD;  Location: [facility];  Service: General;  Laterality: Right;  . BREAST EXCISIONAL BIOPSY Right   . BREAST LUMPECTOMY  2007   RIGHT BREAST  . BREAST LUMPECTOMY WITH RADIOACTIVE SEED AND SENTINEL LYMPH NODE BIOPSY Right 04/05/2018   Procedure: RIGHT BREAST SEED BRACKETED LUMPECTOMY WITH SENTINEL LYMPH NODE BIOPSY;  Surgeon: Axten Pascucci Klein, MD;  Location: [facility];  Service: General;  Laterality: Right;  . BREAST SURGERY     bx, no cancer   . CATARACT EXTRACTION Bilateral   . DILATATION & CURETTAGE/HYSTEROSCOPY WITH MYOSURE N/A 08/09/2017   Procedure: DILATATION & CURETTAGE/HYSTEROSCOPY WITH MYOSURE;  Surgeon: Emily Filbert, MD;  Location:  [facility];  Service: Gynecology;  Laterality: N/A;  . LABIOPLASTY  03/06/2018   Procedure: LABIAPLASTY;  Surgeon: Emily Filbert, MD;  Location: [facility];  Service: Gynecology;;  . VAGINAL HYSTERECTOMY Bilateral 03/06/2018   Procedure: HYSTERECTOMY VAGINAL WITH BILATERAL SALPINGOOPHORECTOMY;  Surgeon: Emily Filbert, MD;  Location: [facility];  Service: Gynecology;  Laterality: Bilateral;   Social History   Socioeconomic History  . Marital status: Married    Spouse name: Not on file  . Number of children: 4  . Years of education: Not on file  . Highest education level: Not on file  Occupational History    . Occupation: house   Social Needs  . Financial resource strain: Not on file  . Food insecurity:    Worry: Not on file    Inability: Not on file  . Transportation needs:    Medical: Not on file    Non-medical: Not on file  Tobacco Use  . Smoking status: Never Smoker  . Smokeless tobacco: Never Used  Substance and Sexual Activity  . Alcohol use: No  . Drug use: No  . Sexual activity: Not on file  Lifestyle  . Physical activity:    Days per week: Not on file    Minutes per session: Not on file  . Stress: Not on file  Relationships  . Social connections:    Talks on phone: Not on file    Gets together: Not on file    Attends religious service: Not on file    Active member of club or organization: Not on file    Attends meetings of clubs or organizations: Not on file    Relationship status: Not on file  Other Topics Concern  . Not on file  Social History Narrative   Original from [place], [place]   Moved to the [place] 2000   Widow    She lives with son.  She had four sons.         Family History  Problem Relation Age of Onset  . Diabetes Mother   . Neuropathy Mother   . Neuropathy Sister   . Cancer Sister        unknown type cancer in neck   . Other Father   . Cancer Maternal Aunt        leukemia   . Colon cancer Neg Hx   . Breast cancer Neg Hx        Review of Systems: Pertinent positive and negative review of systems were noted in the above HPI section. All other review of systems were otherwise negative.    Physical Exam: General: Well developed, well nourished, no acute distress Head: Normocephalic and atraumatic Eyes:  sclerae anicteric, EOMI Ears: Normal auditory acuity Mouth: No deformity or lesions Neck: Supple, no masses or thyromegaly Lungs: Clear throughout to auscultation Heart: Regular rate and rhythm; no murmurs, rubs or bruits Abdomen: Soft, non tender and non distended. No masses, hepatosplenomegaly or hernias noted. Normal Bowel  sounds Rectal: Deferred to colonoscopy at patients request Musculoskeletal: Symmetrical with no gross deformities  Skin: No lesions on visible extremities Pulses:  Normal pulses noted Extremities: No clubbing, cyanosis, edema or deformities noted Neurological: Alert oriented x 4, grossly nonfocal Cervical Nodes:  No significant cervical adenopathy Inguinal Nodes: No significant inguinal adenopathy Psychological:  Alert and cooperative. Normal mood and affect   Assessment and Recommendations:  1.  Iron deficiency anemia.  Constipation, intermittent anorexia and nausea.  Iron deficiency is likely related to uterine  bleeding.  Rule out colorectal neoplasms, ulcer, AVMs, esophagitis, etc.  Schedule colonoscopy and EGD. The risks (including bleeding, perforation, infection, missed lesions, medication reactions and possible hospitalization or surgery if complications occur), benefits, and alternatives to colonoscopy with possible biopsy and possible polypectomy were discussed with the patient and they consent to proceed. The risks (including bleeding, perforation, infection, missed lesions, medication reactions and possible hospitalization or surgery if complications occur), benefits, and alternatives to endoscopy with possible biopsy and possible dilation were discussed with the patient and they consent to proceed. Continue iron bid and follow up with hematology.  Hold iron for 5 days prior to colonoscopy.  2.  Patient requests a female gastroenterologist for colonoscopy and rectal exam. We have arranged transfer to Dr. Silverio Decamp for her endoscopic procedures and for her ongoing GI care.    cc: Alla Feeling, NP [place], [place] [address]

## 2018-07-30 ENCOUNTER — Other Ambulatory Visit: Payer: Self-pay

## 2018-08-03 ENCOUNTER — Other Ambulatory Visit: Payer: Self-pay

## 2018-08-03 ENCOUNTER — Inpatient Hospital Stay: Payer: Medicare Other

## 2018-08-03 VITALS — BP 100/53 | HR 56 | Temp 97.5°F | Resp 18

## 2018-08-03 DIAGNOSIS — D509 Iron deficiency anemia, unspecified: Secondary | ICD-10-CM | POA: Diagnosis not present

## 2018-08-03 DIAGNOSIS — C50511 Malignant neoplasm of lower-outer quadrant of right female breast: Secondary | ICD-10-CM | POA: Diagnosis not present

## 2018-08-03 LAB — CMP (CANCER CENTER ONLY)
ALBUMIN: 3.5 g/dL (ref 3.5–5.0)
ALK PHOS: 110 U/L (ref 38–126)
ALT: 20 U/L (ref 0–44)
ANION GAP: 10 (ref 5–15)
AST: 23 U/L (ref 15–41)
BUN: 16 mg/dL (ref 8–23)
CHLORIDE: 109 mmol/L (ref 98–111)
CO2: 24 mmol/L (ref 22–32)
Calcium: 10 mg/dL (ref 8.9–10.3)
Creatinine: 0.89 mg/dL (ref 0.44–1.00)
GFR, Estimated: >60 mL/min (ref >60)
GLUCOSE: 116 mg/dL — AB (ref 70–99)
Potassium: 4.2 mmol/L (ref 3.5–5.1)
SODIUM: 143 mmol/L (ref 135–145)
Total Bilirubin: 0.3 mg/dL (ref 0.3–1.2)
Total Protein: 7.1 g/dL (ref 6.5–8.1)

## 2018-08-03 LAB — CBC WITH DIFFERENTIAL (CANCER CENTER ONLY)
ABS IMMATURE GRANULOCYTES: 0.01 10*3/uL (ref 0.00–0.07)
BASOS ABS: 0 10*3/uL (ref 0.0–0.1)
BASOS PCT: 1 %
Eosinophils Absolute: 0.1 10*3/uL (ref 0.0–0.5)
Eosinophils Relative: 2 %
HCT: 34 % — ABNORMAL LOW (ref 36.0–46.0)
HEMOGLOBIN: 10.3 g/dL — AB (ref 12.0–15.0)
Immature Granulocytes: 0 %
LYMPHS PCT: 10 %
Lymphs Abs: 0.3 10*3/uL — ABNORMAL LOW (ref 0.7–4.0)
MCH: 26.9 pg (ref 26.0–34.0)
MCHC: 30.3 g/dL (ref 30.0–36.0)
MCV: 88.8 fL (ref 80.0–100.0)
MONO ABS: 0.4 10*3/uL (ref 0.1–1.0)
Monocytes Relative: 13 %
NEUTROS ABS: 2.2 10*3/uL (ref 1.7–7.7)
NRBC: 0 % (ref 0.0–0.2)
Neutrophils Relative %: 74 %
PLATELETS: 227 10*3/uL (ref 150–400)
RBC: 3.83 MIL/uL — ABNORMAL LOW (ref 3.87–5.11)
RDW: 23.2 % — ABNORMAL HIGH (ref 11.5–15.5)
WBC: 3 10*3/uL — AB (ref 4.0–10.5)

## 2018-08-03 LAB — TYPE AND SCREEN
ABO/RH(D): O POS
ANTIBODY SCREEN: NEGATIVE

## 2018-08-03 LAB — IRON AND TIBC
Iron: 86 ug/dL (ref 41–142)
Saturation Ratios: 21 % (ref 21–57)
TIBC: 414 ug/dL (ref 236–444)
UIBC: 328 ug/dL

## 2018-08-03 MED ORDER — SODIUM CHLORIDE 0.9 % IV SOLN
Freq: Once | INTRAVENOUS | Status: AC
  Administered 2018-08-03: 16:00:00 via INTRAVENOUS
  Filled 2018-08-03: qty 250

## 2018-08-03 MED ORDER — SODIUM CHLORIDE 0.9 % IV SOLN
510.0000 mg | Freq: Once | INTRAVENOUS | Status: AC
  Administered 2018-08-03: 510 mg via INTRAVENOUS
  Filled 2018-08-03: qty 17

## 2018-08-03 NOTE — Patient Instructions (Signed)

## 2018-08-08 ENCOUNTER — Encounter: Payer: Self-pay | Admitting: Radiation Oncology

## 2018-08-09 LAB — FOLATE RBC
Folate, Hemolysate: 365 ng/mL
Folate, RBC: 1011 ng/mL (ref >498)
HEMATOCRIT: 36.1 % (ref 34.0–46.6)

## 2018-08-17 ENCOUNTER — Ambulatory Visit
Admission: RE | Admit: 2018-08-17 | Discharge: 2018-08-17 | Disposition: A | Discharge status: Home / Self Care | Payer: Medicare Other | Source: Ambulatory Visit | Attending: Radiation Oncology | Admitting: Radiation Oncology

## 2018-08-17 ENCOUNTER — Telehealth: Payer: Self-pay

## 2018-08-17 HISTORY — DX: Personal history of irradiation: Z92.3

## 2018-08-17 NOTE — Telephone Encounter (Signed)
Ms. Amy Roman had an appointment scheduled today for 11:20 with Dr. Isidore Moos. She has not shown for this appointment. I called and left a voice mail at her phone number asking for her to call me and reschedule. I did leave my direct contact number.

## 2018-08-20 ENCOUNTER — Telehealth: Payer: Self-pay | Admitting: Gastroenterology

## 2018-08-20 NOTE — Telephone Encounter (Signed)
Hi Dr. Silverio Decamp, this pt just cancelled her endo colon that was scheduled with you tomorrow 08/21/18. Because of a severe language barrier I was not able to understand the reason why she needed to cancel. She rescheduled to 09/25/18. Thank you

## 2018-08-20 NOTE — Telephone Encounter (Signed)
Please charge late cancellation fee

## 2018-08-21 ENCOUNTER — Encounter: Payer: Self-pay | Admitting: Gastroenterology

## 2018-08-22 DIAGNOSIS — F411 Generalized anxiety disorder: Secondary | ICD-10-CM | POA: Diagnosis not present

## 2018-08-22 DIAGNOSIS — F331 Major depressive disorder, recurrent, moderate: Secondary | ICD-10-CM | POA: Diagnosis not present

## 2018-08-22 DIAGNOSIS — F330 Major depressive disorder, recurrent, mild: Secondary | ICD-10-CM | POA: Diagnosis not present

## 2018-08-23 ENCOUNTER — Inpatient Hospital Stay: Payer: Medicare Other

## 2018-08-23 ENCOUNTER — Inpatient Hospital Stay: Payer: Medicare Other | Admitting: Hematology

## 2018-09-12 ENCOUNTER — Other Ambulatory Visit: Payer: Self-pay

## 2018-09-23 HISTORY — PX: COLONOSCOPY: SHX174

## 2018-09-23 HISTORY — PX: UPPER GI ENDOSCOPY: SHX6162

## 2018-09-25 ENCOUNTER — Encounter: Payer: Self-pay | Admitting: Gastroenterology

## 2018-09-25 ENCOUNTER — Ambulatory Visit (AMBULATORY_SURGERY_CENTER): Payer: Medicare Other | Admitting: Gastroenterology

## 2018-09-25 ENCOUNTER — Other Ambulatory Visit: Payer: Self-pay | Admitting: *Deleted

## 2018-09-25 VITALS — BP 128/70 | HR 60 | Temp 98.4°F | Resp 16 | Ht 61.0 in | Wt 188.0 lb

## 2018-09-25 DIAGNOSIS — F329 Major depressive disorder, single episode, unspecified: Secondary | ICD-10-CM | POA: Diagnosis not present

## 2018-09-25 DIAGNOSIS — K648 Other hemorrhoids: Secondary | ICD-10-CM | POA: Diagnosis not present

## 2018-09-25 DIAGNOSIS — B9681 Helicobacter pylori [H. pylori] as the cause of diseases classified elsewhere: Secondary | ICD-10-CM | POA: Diagnosis not present

## 2018-09-25 DIAGNOSIS — K2950 Unspecified chronic gastritis without bleeding: Secondary | ICD-10-CM | POA: Diagnosis not present

## 2018-09-25 DIAGNOSIS — K5900 Constipation, unspecified: Secondary | ICD-10-CM

## 2018-09-25 DIAGNOSIS — K2970 Gastritis, unspecified, without bleeding: Secondary | ICD-10-CM

## 2018-09-25 DIAGNOSIS — D509 Iron deficiency anemia, unspecified: Secondary | ICD-10-CM

## 2018-09-25 DIAGNOSIS — K5730 Diverticulosis of large intestine without perforation or abscess without bleeding: Secondary | ICD-10-CM

## 2018-09-25 MED ORDER — SODIUM CHLORIDE 0.9 % IV SOLN: 500.0000 mL | Freq: Once | INTRAVENOUS | Status: DC

## 2018-09-25 MED ORDER — LETROZOLE 2.5 MG PO TABS: 2.5000 mg | ORAL_TABLET | Freq: Every day | ORAL | 2 refills | Status: DC

## 2018-09-25 NOTE — Patient Instructions (Signed)
YOU HAD AN ENDOSCOPIC PROCEDURE TODAY AT [facility]:   Refer to the procedure report that was given to you for any specific questions about what was found during the examination.  If the procedure report does not answer your questions, please call your gastroenterologist to clarify.  If you requested that your care partner not be given the details of your procedure findings, then the procedure report has been included in a sealed envelope for you to review at your convenience later.  YOU SHOULD EXPECT: Some feelings of bloating in the abdomen. Passage of more gas than usual.  Walking can help get rid of the air that was put into your GI tract during the procedure and reduce the bloating. If you had a lower endoscopy (such as a colonoscopy or flexible sigmoidoscopy) you may notice spotting of blood in your stool or on the toilet paper. If you underwent a bowel prep for your procedure, you may not have a normal bowel movement for a few days.  Please Note:  You might notice some irritation and congestion in your nose or some drainage.  This is from the oxygen used during your procedure.  There is no need for concern and it should clear up in a day or so.  SYMPTOMS TO REPORT IMMEDIATELY:   Following lower endoscopy (colonoscopy or flexible sigmoidoscopy):  Excessive amounts of blood in the stool  Significant tenderness or worsening of abdominal pains  Swelling of the abdomen that is new, acute  Fever of 100F or higher   Following upper endoscopy (EGD)  Vomiting of blood or coffee ground material  New chest pain or pain under the shoulder blades  Painful or persistently difficult swallowing  New shortness of breath  Fever of 100F or higher  Black, tarry-looking stools  For urgent or emergent issues, a gastroenterologist can be reached at any hour by calling [phone].   DIET:  We do recommend a small meal at first, but then you may proceed to your regular diet.  Drink  plenty of fluids but you should avoid alcoholic beverages for 24 hours.  ACTIVITY:  You should plan to take it easy for the rest of today and you should NOT DRIVE or use heavy machinery until tomorrow (because of the sedation medicines used during the test).    FOLLOW UP: Our staff will call the number listed on your records the next business day following your procedure to check on you and address any questions or concerns that you may have regarding the information given to you following your procedure. If we do not reach you, we will leave a message.  However, if you are feeling well and you are not experiencing any problems, there is no need to return our call.  We will assume that you have returned to your regular daily activities without incident.  If any biopsies were taken you will be contacted by phone or by letter within the next 1-3 weeks.  Please call us at [phone] if you have not heard about the biopsies in 3 weeks.    SIGNATURES/CONFIDENTIALITY: You and/or your care partner have signed paperwork which will be entered into your electronic medical record.  These signatures attest to the fact that that the information above on your After Visit Summary has been reviewed and is understood.  Full responsibility of the confidentiality of this discharge information lies with you and/or your care-partner.    Handouts were given to your care partner on Diverticulosis,  Hemorrhoids, and Gastritis. You may resume your current medications today. Await biopsy results. To visualize the small bowel, the office will call you with an appointment for a Video Capsule Endoscopy at the next available appointment. Please call if any questions or concerns.

## 2018-09-25 NOTE — Progress Notes (Signed)
A/O, VSS. Report to PACU RN

## 2018-09-25 NOTE — Progress Notes (Signed)
Called to room to assist during endoscopic procedure.  Patient ID and intended procedure confirmed with present staff. Received instructions for my participation in the procedure from the performing physician.  

## 2018-09-25 NOTE — Progress Notes (Signed)
Pt's states no medical or surgical changes since previsit or office visit. 

## 2018-09-25 NOTE — Progress Notes (Signed)
No problems noted in the recovery room. maw 

## 2018-09-25 NOTE — Progress Notes (Signed)
Pt speaks some english.  She had an interpreter from [place], Milbert Coulter.  Pt's son was her care partner and he also speaks english. maw

## 2018-09-26 ENCOUNTER — Other Ambulatory Visit: Payer: Self-pay | Admitting: Hematology

## 2018-09-26 ENCOUNTER — Telehealth: Payer: Self-pay | Admitting: *Deleted

## 2018-09-26 DIAGNOSIS — D509 Iron deficiency anemia, unspecified: Secondary | ICD-10-CM

## 2018-09-26 NOTE — Telephone Encounter (Signed)
  Follow up Call-  Call back number 09/25/2018  Post procedure Call Back phone  # [phone]  Permission to leave phone message Yes  Some recent data might be hidden     Patient questions:  Do you have a fever, pain , or abdominal swelling? No. Pain Score  0 *  Have you tolerated food without any problems? Yes.    Have you been able to return to your normal activities? Yes.    Do you have any questions about your discharge instructions: Diet   No. Medications  No. Follow up visit  No.  Do you have questions or concerns about your Care? No.  Actions: * If pain score is 4 or above: No action needed, pain <4.

## 2018-09-27 ENCOUNTER — Inpatient Hospital Stay: Payer: Medicare Other | Attending: Nurse Practitioner | Admitting: Hematology

## 2018-09-27 ENCOUNTER — Inpatient Hospital Stay: Payer: Medicare Other

## 2018-09-27 NOTE — Op Note (Signed)
[facility] Patient Name: Amy Roman Procedure Date: 09/25/2018 1:22 PM MRN: [id number] Endoscopist: Mauri Pole , MD Age: 67 Referring MD:  Date of Birth: 08-Feb-1951 Gender: Female Account #: [id number] Procedure:                Upper GI endoscopy Indications:              Suspected upper gastrointestinal bleeding in                            patient with unexplained iron deficiency anemia Medicines:                Monitored Anesthesia Care Procedure:                Pre-Anesthesia Assessment:                           - Prior to the procedure, a History and Physical                            was performed, and patient medications and                            allergies were reviewed. The patient's tolerance of                            previous anesthesia was also reviewed. The risks                            and benefits of the procedure and the sedation                            options and risks were discussed with the patient.                            All questions were answered, and informed consent                            was obtained. Prior Anticoagulants: The patient has                            taken no previous anticoagulant or antiplatelet                            agents. ASA Grade Assessment: II - A patient with                            mild systemic disease. After reviewing the risks                            and benefits, the patient was deemed in                            satisfactory condition to undergo the procedure.  After obtaining informed consent, the endoscope was                            passed under direct vision. Throughout the                            procedure, the patient's blood pressure, pulse, and                            oxygen saturations were monitored continuously. The                            Endoscope was introduced through the mouth, and   advanced to the second part of duodenum. The upper                            GI endoscopy was accomplished without difficulty.                            The patient tolerated the procedure well. Scope In: Scope Out: Findings:                 The Z-line was regular and was found 34 cm from the                            incisors.                           No gross lesions were noted in the entire esophagus.                           Patchy mild inflammation with hemorrhage                            characterized by congestion (edema) and erythema                            was found in the entire examined stomach. Biopsies                            were taken with a cold forceps for Helicobacter                            pylori testing.                           The examined duodenum was normal. Complications:            No immediate complications. Estimated Blood Loss:     Estimated blood loss was minimal. Impression:               - Z-line regular, 34 cm from the incisors.                           - No gross lesions in esophagus.                           -  Gastritis with hemorrhage. Biopsied.                           - Normal examined duodenum. Recommendation:           - Patient has a contact number available for                            emergencies. The signs and symptoms of potential                            delayed complications were discussed with the                            patient. Return to normal activities tomorrow.                            Written discharge instructions were provided to the                            patient.                           - Resume previous diet.                           - Continue present medications.                           - Await pathology results. Mauri Pole, MD 09/25/2018 2:10:29 PM This report has been signed electronically.

## 2018-09-27 NOTE — Op Note (Addendum)
[facility] Patient Name: Amy Roman Procedure Date: 09/25/2018 1:22 PM MRN: [id number] Endoscopist: Mauri Pole , MD Age: 67 Referring MD:  Date of Birth: Mar 18, 1951 Gender: Female Account #: [id number] Procedure:                Colonoscopy Indications:              Unexplained iron deficiency anemia Medicines:                Monitored Anesthesia Care Procedure:                Pre-Anesthesia Assessment:                           - Prior to the procedure, a History and Physical                            was performed, and patient medications and                            allergies were reviewed. The patient's tolerance of                            previous anesthesia was also reviewed. The risks                            and benefits of the procedure and the sedation                            options and risks were discussed with the patient.                            All questions were answered, and informed consent                            was obtained. Prior Anticoagulants: The patient has                            taken no previous anticoagulant or antiplatelet                            agents. ASA Grade Assessment: II - A patient with                            mild systemic disease. After reviewing the risks                            and benefits, the patient was deemed in                            satisfactory condition to undergo the procedure.                           After obtaining informed consent, the colonoscope  was passed under direct vision. Throughout the                            procedure, the patient's blood pressure, pulse, and                            oxygen saturations were monitored continuously. The                            Colonoscope was introduced through the anus and                            advanced to the the terminal ileum, with                            identification of the  appendiceal orifice and IC                            valve. The colonoscopy was performed without                            difficulty. The patient tolerated the procedure                            well. The quality of the bowel preparation was                            good. The terminal ileum, ileocecal valve,                            appendiceal orifice, and rectum were photographed. Scope In: 1:54:07 PM Scope Out: 2:07:06 PM Scope Withdrawal Time: 0 hours 7 minutes 44 seconds  Total Procedure Duration: 0 hours 12 minutes 59 seconds  Findings:                 The perianal and digital rectal examinations were                            normal.                           A few small-mouthed diverticula were found in the                            sigmoid colon and descending colon.                           Non-bleeding internal hemorrhoids were found during                            retroflexion. The hemorrhoids were medium-sized.                           The exam was otherwise without abnormality. Complications:            No immediate  complications. Estimated Blood Loss:     Estimated blood loss was minimal. Impression:               - Diverticulosis in the sigmoid colon and in the                            descending colon.                           - Non-bleeding internal hemorrhoids.                           - The examination was otherwise normal.                           - No specimens collected. Recommendation:           - Patient has a contact number available for                            emergencies. The signs and symptoms of potential                            delayed complications were discussed with the                            patient. Return to normal activities tomorrow.                            Written discharge instructions were provided to the                            patient.                           - Resume previous diet.                            - Continue present medications.                           - Repeat colonoscopy in 10 years for screening                            purposes.                           - To visualize the small bowel, perform video                            capsule endoscopy at the next available appointment. Mauri Pole, MD 09/25/2018 2:13:53 PM This report has been signed electronically.

## 2018-10-02 ENCOUNTER — Inpatient Hospital Stay: Admission: RE | Admit: 2018-10-02 | Payer: Self-pay | Source: Ambulatory Visit

## 2018-10-05 ENCOUNTER — Telehealth: Payer: Self-pay

## 2018-10-05 ENCOUNTER — Other Ambulatory Visit: Payer: Self-pay

## 2018-10-05 NOTE — Telephone Encounter (Addendum)
Discussed with the son, the instructions for the capsule endoscopy prep. He will call back for this appointment. Declines to schedule at this time. He is driving and does not have access to his calendar. The capsule endoscopy will not require a pre-certification from her insurance per our referral coordinator.  I am hoping to be able to mail the written instructions to her home before the appointment to decrease chance of a poor prep.

## 2018-10-08 ENCOUNTER — Other Ambulatory Visit: Payer: Self-pay | Admitting: Gastroenterology

## 2018-10-08 ENCOUNTER — Other Ambulatory Visit: Payer: Self-pay

## 2018-10-08 MED ORDER — BIS SUBCIT-METRONID-TETRACYC 140-125-125 MG PO CAPS: 3.0000 | ORAL_CAPSULE | Freq: Three times a day (TID) | ORAL | 0 refills | Status: DC

## 2018-10-08 MED ORDER — OMEPRAZOLE 40 MG PO CPDR: 40.0000 mg | DELAYED_RELEASE_CAPSULE | Freq: Two times a day (BID) | ORAL | 0 refills | Status: DC

## 2018-11-07 DIAGNOSIS — F411 Generalized anxiety disorder: Secondary | ICD-10-CM | POA: Diagnosis not present

## 2018-11-07 DIAGNOSIS — F331 Major depressive disorder, recurrent, moderate: Secondary | ICD-10-CM | POA: Diagnosis not present

## 2018-11-07 DIAGNOSIS — F330 Major depressive disorder, recurrent, mild: Secondary | ICD-10-CM | POA: Diagnosis not present

## 2018-11-26 ENCOUNTER — Telehealth: Payer: Self-pay | Admitting: Hematology

## 2018-11-26 NOTE — Telephone Encounter (Signed)
Tried to call regarding cancellation of scp visit. No voicemail

## 2018-12-11 DIAGNOSIS — E663 Overweight: Secondary | ICD-10-CM | POA: Diagnosis not present

## 2018-12-11 DIAGNOSIS — M150 Primary generalized (osteo)arthritis: Secondary | ICD-10-CM | POA: Diagnosis not present

## 2018-12-11 DIAGNOSIS — M25569 Pain in unspecified knee: Secondary | ICD-10-CM | POA: Diagnosis not present

## 2018-12-25 ENCOUNTER — Encounter: Payer: Self-pay | Admitting: Adult Health

## 2019-03-25 DIAGNOSIS — F331 Major depressive disorder, recurrent, moderate: Secondary | ICD-10-CM | POA: Diagnosis not present

## 2019-03-25 DIAGNOSIS — F330 Major depressive disorder, recurrent, mild: Secondary | ICD-10-CM | POA: Diagnosis not present

## 2019-03-25 DIAGNOSIS — F411 Generalized anxiety disorder: Secondary | ICD-10-CM | POA: Diagnosis not present

## 2019-04-02 ENCOUNTER — Telehealth: Payer: Self-pay | Admitting: Hematology

## 2019-04-02 NOTE — Telephone Encounter (Signed)
Called patient via [facility], #[phone] re rescheduling March SCP visit. Not able to reach patient or leave message due to phone does not take incoming calls. Rescheduled for 7/6 and mailed schedule.

## 2019-04-05 ENCOUNTER — Other Ambulatory Visit: Payer: Self-pay | Admitting: Hematology

## 2019-04-10 ENCOUNTER — Other Ambulatory Visit: Payer: Self-pay | Admitting: Hematology

## 2019-04-15 ENCOUNTER — Telehealth: Payer: Self-pay | Admitting: *Deleted

## 2019-04-15 NOTE — Telephone Encounter (Signed)
"  Amy Roman's son [name] calling about letrozole refill.  Pharmacy sent request for refill waiting to hear.  Taking too long.  We call every month.  What can we do to not have to call?" Refill sent [place] 04-10-2019 reads received by [facility] at [facility].  Three refills authorized.  Should not have to call office.  Could call [facility] to ensure 04-10-2019 order has not been restocked and ready for pick up.  Denies further questions or needs at this time,

## 2019-04-21 ENCOUNTER — Encounter (HOSPITAL_COMMUNITY): Payer: Self-pay

## 2019-04-21 ENCOUNTER — Emergency Department (HOSPITAL_COMMUNITY)
Admission: EM | Admit: 2019-04-21 | Discharge: 2019-04-21 | Disposition: A | Discharge status: Home / Self Care | Payer: Medicare Other | Attending: Emergency Medicine | Admitting: Emergency Medicine

## 2019-04-21 ENCOUNTER — Other Ambulatory Visit: Payer: Self-pay

## 2019-04-21 DIAGNOSIS — R05 Cough: Secondary | ICD-10-CM | POA: Insufficient documentation

## 2019-04-21 DIAGNOSIS — Z5321 Procedure and treatment not carried out due to patient leaving prior to being seen by health care provider: Secondary | ICD-10-CM | POA: Diagnosis not present

## 2019-04-21 DIAGNOSIS — J029 Acute pharyngitis, unspecified: Secondary | ICD-10-CM | POA: Diagnosis not present

## 2019-04-21 NOTE — ED Triage Notes (Signed)
She c/o cough/uri sx x 2 days. She states she had a fever yesterday, but not today. She is ambulatory and in no distress.

## 2019-04-23 NOTE — Progress Notes (Deleted)
CLINIC:  Survivorship   REASON FOR VISIT:  Routine follow-up post-treatment for a recent history of breast cancer.  Patient Care Team: Lin Landsman, MD as PCP - General (Family Medicine) Alda Berthold, DO as Consulting Physician (Neurology) Truitt Merle, MD as Consulting Physician (Hematology) Stark Klein, MD as Consulting Physician (General Surgery) Eppie Gibson, MD as Attending Physician (Radiation Oncology) Alla Feeling, NP as Nurse Practitioner (Nurse Practitioner)   BRIEF ONCOLOGIC HISTORY:  Oncology History Overview Note  Cancer Staging Malignant neoplasm of 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, estrogen receptor positive ([place]) Staging form: Breast, AJCC 8th Edition - Clinical stage from 02/16/2018: Stage IB (cT2(m), cN0, cM0, G2, ER+, PR+, HER2-) - Signed by Truitt Merle, MD on 03/11/2018    Malignant neoplasm of 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, estrogen receptor positive ([place])  01/30/2018 Mammogram   Screening mammogram FINDINGS: In the right breast, possible distortion warrants further evaluation. The patient has had an excisional biopsy of her right breast upper outer quadrant in 2007, however this distortion is either better seen or new from her prior mammogram, and it does not quite match the surgical site as marked by a skin scar marker. In the left breast, no findings suspicious for malignancy.   02/14/2018 Mammogram   Diagnostic mammogram: In the lateral aspect of the right breast, posterior depth there is a prominent area of distortion at the approximate 9 o'clock location. While this is in the region of the excisional biopsy that the patient had in 2007, the distortion has become significantly more prominent as compared to the tomosynthesis mammogram performed in September of 2016, and appears to be inferior to the surgical scar.   02/14/2018 Breast US   Ultrasound of the right breast at 8:30, 4 cm from the nipple demonstrates an irregular  hypoechoic vascular mass with spiculated margins measuring 2.1 x 2.0 x 1.8 cm. There is a small adjacent mass at 9 o'clock, 4 cm from the nipple, which is 1.2 cm away from the dominant mass. The smaller mass measures 1.1 x 0.7 x 0.7 cm. Together, the 2 masses span 3.6 cm of tissue. Ultrasound of the right axilla demonstrates multiple normal-appearing lymph nodes.  IMPRESSION: 1. There is a highly suspicious mass in the right breast at 830 with a small possible satellite lesion at 9 o'clock. 2.  No evidence of right axillary lymphadenopathy.   02/16/2018 Cancer Staging   Staging form: Breast, AJCC 8th Edition - Clinical stage from 02/16/2018: Stage IB (cT2(m), cN0, cM0, G2, ER+, PR+, HER2-) - Signed by Truitt Merle, MD on 03/11/2018   02/16/2018 Initial Biopsy   Diagnosis 1. Breast, right, needle core biopsy, 8:30 o'clock - INVASIVE DUCTAL CARCINOMA, SEE COMMENT. 2. Breast, right, needle core biopsy, 9 o'clock - INVASIVE DUCTAL CARCINOMA. - LOBULAR NEOPLASIA (ATYPICAL LOBULAR HYPERPLASIA).  ER: 95%, positive, strong staining PR: 80%, positive, moderate staining  Proliferation Marker Ki67: 2%  HER2 - Negative Ratio of HER2/CEP17 signals: 1.80 Average HER2 copy number per cell: 2.97    03/01/2018 Imaging   MR Bilateral breast IMPRESSION: Two areas of known malignancy in the LATERAL portion of the RIGHT breast. No additional areas of concern in either breast. No axillary adenopathy.   03/11/2018 Initial Diagnosis   Malignant neoplasm of 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, estrogen receptor positive ([place])   04/05/2018 Surgery   RIGHT BREAST SEED BRACKETED LUMPECTOMY WITH SENTINEL LYMPH NODE BIOPSY by Dr. Barry Dienes 04/05/18   04/05/2018 Pathology Results  Surgery  Diagnosis 04/05/18  1. Breast, lumpectomy, right - INVASIVE DUCTAL CARCINOMA GRADE I/III, TWO FOCI SPANNING 2.9 CM AND 0.9 CM. - LOBULAR NEOPLASIA (LOBULAR CARCINOMA IN SITU). - DUCTAL CARCINOMA IN SITU, LOW GRADE. -  LYMPHOVASCULAR INVASION IS IDENTIFIED. - THE SURGICAL RESECTION MARGINS ARE NEGATIVE FOR DUCTAL CARCINOMA. - SEE ONCOLOGY TABLE BELOW. 2. Lymph node, sentinel, biopsy, right - THERE IS NO EVIDENCE OF CARCINOMA IN 1 OF 1 LYMPH NODE (0/1). 3. Lymph node, sentinel, biopsy, right - METASTATIC CARCINOMA IN 1 OF 1 LYMPH NODE (1/1) WITH EXTRACAPSULAR EXTENSION. - EXTRACAPSULAR EXTENSION EXTENDS 0.3 CM. 4. Lymph node, sentinel, biopsy, right - DUCTAL CARCINOMA. - PERINEURAL INVASION IS IDENTIFIED. - SEE COMMENT. 5. Lymph node, sentinel, biopsy, right 1 of 4   04/05/2018 Miscellaneous   Mammaprint Low Risk with 10% risk of recurrance with Tamoxifen alone  MPI at +0.240 DMFI at 97.8%   04/12/2018 Cancer Staging   Staging form: Breast, AJCC 8th Edition - Pathologic: Stage IA (pT2, pN1a, cM0, G1, ER+, PR+, HER2-) - Signed by Truitt Merle, MD on 04/12/2018   04/13/2018 Imaging   Whole Body Bone scan 04/13/18 IMPRESSION: No scintigraphic evidence of osseous metastatic disease.   04/16/2018 Imaging   CT CAP W Contrast 04/16/18  IMPRESSION: 1. No definite findings of metastatic disease in the chest. Solitary solid 4 mm right lower lobe pulmonary nodule, for which initial chest CT follow-up is advised in 3 months. 2. Postsurgical changes from right breast lumpectomy and right axillary node dissection. Thin-walled fluid collections in the right breast and right axilla are most compatible with postsurgical seromas. 3. No evidence of metastatic disease in the abdomen or pelvis.    04/17/2018 Surgery   AXILLARY LYMPH NODE DISSECTION by Dr. Barry Dienes 04/17/18     04/17/2018 Pathology Results   Diagnosis 04/17/18  Lymph nodes, regional resection, Right Axillary - BENIGN FIBROADIPOSE AND BREAST TISSUE WITH FAT NECROSIS. - NO LYMPHOID TISSUE PRESENT. - NO MALIGNANCY IDENTIFIED. Microscopic Comment The entire specimen was submitted.   05/28/2018 - 07/10/2018 Radiation Therapy   Radiation treatment dates:    05/28/2018-07/10/2018  Site/dose:    1. Right breast, 2 Gy in 25 fractions for a total dose of 50 Gy 2. Right SCV, 2 Gy in 25 fractions for a total dose of 50 Gy 3. Right breast boost, 2 Gy in 5 fractions for a total dose of 5 Gy           07/24/2018 -  Anti-estrogen oral therapy   Letrozole 2.51m daily starting 07/24/18    Survivorship   Per LCira Rue NP      INTERVAL HISTORY:  [facility] to review her survivorship care plan detailing her treatment course for breast cancer, as well as monitoring long-term side effects of that treatment, education regarding health maintenance, screening, and overall wellness and health promotion.     Overall, Amy Roman reports feeling quite well since completing her radiation therapy approximately 3 months ago.  She ***    REVIEW OF SYSTEMS:  Review of Systems - Oncology Breast: Denies any new nodularity, masses, tenderness, nipple changes, or nipple discharge.      ONCOLOGY TREATMENT TEAM:  1. [facility]  [facility] 2. Medical Oncologist: Dr. FBurr Medico3. Radiation Oncologist: Dr. SIsidore Moos   PAST MEDICAL/SURGICAL HISTORY:  Past Medical History:  Diagnosis Date   Anemia    Anxiety and depression 10/15/2014   Arthritis  BILATERAL KNEES, HAD CORTISONE INJECTION 03/2017   Brain cancer ([place])    Depression    GERD (gastroesophageal reflux disease)    OCCASIONAL WITH CERTAIN FOOD   Headache    History of radiation therapy 05/28/18- 07/10/18   Right Breast 25 fractions for a total dose of 50 Gy, Right SCV and PAB 25 fractions for a total dose of 50 Gy, right breast boost, 5 fractions for a total dose of 10 Gy.    Seasonal allergies    SVD (spontaneous vaginal delivery)    x 4   Unspecified hereditary and idiopathic peripheral neuropathy 05/14/2014   LEFT KNEE AND FOOT   Past Surgical History:  Procedure Laterality Date    AXILLARY LYMPH NODE DISSECTION Right 04/17/2018   Procedure: AXILLARY LYMPH NODE DISSECTION;  Surgeon: Stark Klein, MD;  Location: [facility];  Service: General;  Laterality: Right;   BREAST EXCISIONAL BIOPSY Right    BREAST LUMPECTOMY  2007   RIGHT BREAST   BREAST LUMPECTOMY WITH RADIOACTIVE SEED AND SENTINEL LYMPH NODE BIOPSY Right 04/05/2018   Procedure: RIGHT BREAST SEED BRACKETED LUMPECTOMY WITH SENTINEL LYMPH NODE BIOPSY;  Surgeon: Stark Klein, MD;  Location: [facility];  Service: General;  Laterality: Right;   BREAST SURGERY     bx, no cancer    CATARACT EXTRACTION Bilateral    DILATATION & CURETTAGE/HYSTEROSCOPY WITH MYOSURE N/A 08/09/2017   Procedure: [place];  Surgeon: Emily Filbert, MD;  Location: [facility];  Service: Gynecology;  Laterality: N/A;   LABIOPLASTY  03/06/2018   Procedure: LABIAPLASTY;  Surgeon: Emily Filbert, MD;  Location: [facility];  Service: Gynecology;;   VAGINAL HYSTERECTOMY Bilateral 03/06/2018   Procedure: HYSTERECTOMY VAGINAL WITH BILATERAL SALPINGOOPHORECTOMY;  Surgeon: Emily Filbert, MD;  Location: [facility];  Service: Gynecology;  Laterality: Bilateral;     ALLERGIES:  No Known Allergies   CURRENT MEDICATIONS:  Outpatient Encounter Medications as of 04/29/2019  Medication Sig Note   b complex vitamins tablet Take 1 tablet by mouth daily.    bismuth subsalicylate (PEPTO BISMOL) 262 MG chewable tablet Chew 524 mg by mouth as needed.    bismuth-metronidazole-tetracycline (PYLERA) 140-125-125 MG capsule Take 3 capsules by mouth 4 (four) times daily -  before meals and at bedtime.    ferrous sulfate 325 (65 FE) MG EC tablet Take 325 mg by mouth 3 (three) times daily with meals.    FLUoxetine (PROZAC) 40 MG capsule Take 40 mg by mouth daily.    gabapentin (NEURONTIN) 800 MG tablet Take 200 mg by mouth 4 (four) times daily as needed.  03/06/2018: Pt cuts tablet into 4 pieces and only take  1 piece at a time   letrozole (FEMARA) 2.5 MG tablet TAKE 1 TABLET BY MOUTH DAILY    omeprazole (PRILOSEC) 40 MG capsule Take 1 capsule (40 mg total) by mouth 2 (two) times daily for 10 days.    traZODone (DESYREL) 100 MG tablet Take 100 mg by mouth at bedtime.    No facility-administered encounter medications on file as of 04/29/2019.      ONCOLOGIC FAMILY HISTORY:  Family History  Problem Relation Age of Onset   Diabetes Mother    Neuropathy Mother    Neuropathy Sister    Cancer Sister        unknown type cancer in neck    Other Father    Cancer Maternal Aunt        leukemia  Colon cancer Neg Hx    Breast cancer Neg Hx      GENETIC COUNSELING/TESTING: No  SOCIAL HISTORY:  Amna A Amy Roman is /single/married/divorced/widowed/separated and lives alone/with her spouse/family/friend in (city), [place].  She has (#) children and they live in (city).  Amy Roman is currently retired/disabled/working part-time/full-time as ***.  She denies any current or history of tobacco, alcohol, or illicit drug use.     PHYSICAL EXAMINATION:  Vital Signs:  There were no vitals filed for this visit. There were no vitals filed for this visit. General: Well-nourished, well-appearing female in no acute distress.  She is unaccompanied/accompanied in clinic by her ***** today.   HEENT: Head is normocephalic.  Pupils equal and reactive to light. Conjunctivae clear without exudate.  Sclerae anicteric. Oral mucosa is pink, moist.  Oropharynx is pink without lesions or erythema.  Lymph: No cervical, supraclavicular, or infraclavicular lymphadenopathy noted on palpation.  Cardiovascular: Regular rate and rhythm.Marland Kitchen Respiratory: Clear to auscultation bilaterally. Chest expansion symmetric; breathing non-labored.  GI: Abdomen soft and round; non-tender, non-distended. Bowel sounds normoactive.  GU: Deferred.  Neuro: No focal deficits. Steady gait.  Psych: Mood and affect  normal and appropriate for situation.  Extremities: No edema. MSK: No focal spinal tenderness to palpation.  Full range of motion in bilateral upper extremities Skin: Warm and dry.  LABORATORY DATA:  None for this visit.  DIAGNOSTIC IMAGING:  None for this visit.      ASSESSMENT AND PLAN:  Ms.. Amy Roman is a pleasant 68 y.o. 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  She presents to the Survivorship Clinic for our initial meeting and routine follow-up post-completion of treatment for breast cancer.    1. Stage I right breast cancer:  Amy Roman is continuing to recover from definitive treatment for breast cancer. She will follow-up with her medical oncologist, Dr. Burr Medico in 07/2019 with history and physical exam per surveillance protocol.  She will continue her anti-estrogen therapy with Letrozole. Thus far, she is tolerating the *** well, with minimal side effects. She was instructed to make Dr. Burr Medico or myself aware if she begins to experience any worsening side effects of the medication and I could see her back in clinic to help manage those side effects, as needed. She is overdue for mammogram, I placed the order today. Breast density category C. Today, a comprehensive survivorship care plan and treatment summary was reviewed with the patient today detailing her breast cancer diagnosis, treatment course, potential late/long-term effects of treatment, appropriate follow-up care with recommendations for the future, and patient education resources.  A copy of this summary, along with a letter will be sent to the patients primary care provider via mail/fax/In Basket message after todays visit.    #. Problem(s) at Visit______________  #. Bone health:  Given Amy Roman's age/history of breast cancer and her  current treatment regimen including anti-estrogen therapy with AI (letrozole), she is at risk for bone demineralization.  She has not had DEXA scan before, order has been placed. She will call breast center to have it done with mammogram. In the meantime, she was encouraged to increase her consumption of foods rich in calcium, as well as increase her weight-bearing activities.  She was given education on specific activities to promote bone health.  #. Cancer screening:  Due to Amy Roman's history and her age, she should receive screening  for skin cancers, colon cancer, and gynecologic cancers.  The information and recommendations are listed on the patient's comprehensive care plan/treatment summary and were reviewed in detail with the patient.    #. Health maintenance and wellness promotion: Amy Roman was encouraged to consume 5-7 servings of fruits and vegetables per day. We reviewed the "Nutrition Rainbow" handout, as well as the handout "Take Control of Your Health and Reduce Your Cancer Risk" from the [place].  She was also encouraged to engage in moderate to vigorous exercise for 30 minutes per day most days of the week. We discussed the [employer] fitness program, which is designed for cancer survivors to help them become more physically fit after cancer treatments.  She was instructed to limit her alcohol consumption and continue to abstain from tobacco use/***was encouraged stop smoking.     #. Support services/counseling: It is not uncommon for this period of the patient's cancer care trajectory to be one of many emotions and stressors.  We discussed an opportunity for her to participate in the next session of [facility] ("Finding Your New Normal") support group series designed for patients after they have completed treatment.   Amy Roman was encouraged to take advantage of our many other support services programs, support groups, and/or counseling in coping with  her new Roman as a cancer survivor after completing anti-cancer treatment.  She was offered support today through active listening and expressive supportive counseling.  She was given information regarding our available services and encouraged to contact me with any questions or for help enrolling in any of our support group/programs.    Dispo:   -Return to cancer center 07/2019  -Mammogram due now, she will call breast center  -Follow up with surgery in next few months, please call Dr. Marlowe Aschoff office to schedule -She is welcome to return back to the Survivorship Clinic at any time; no additional follow-up needed at this time.  -Consider referral back to survivorship as a long-term survivor for continued surveillance  A total of (30) minutes of face-to-face time was spent with this patient with greater than 50% of that time in counseling and care-coordination.   Cira Rue, NP Survivorship Program [facility] [phone]   Note: PRIMARY CARE PROVIDER Lin Landsman, [place] [phone]

## 2019-04-26 DIAGNOSIS — Z1159 Encounter for screening for other viral diseases: Secondary | ICD-10-CM | POA: Diagnosis not present

## 2019-04-29 ENCOUNTER — Telehealth: Payer: Self-pay

## 2019-04-29 ENCOUNTER — Other Ambulatory Visit: Payer: Self-pay | Admitting: Nurse Practitioner

## 2019-04-29 ENCOUNTER — Inpatient Hospital Stay: Payer: Medicare Other | Admitting: Nurse Practitioner

## 2019-04-29 DIAGNOSIS — C50511 Malignant neoplasm of lower-outer quadrant of right female breast: Secondary | ICD-10-CM

## 2019-04-29 DIAGNOSIS — Z170 Estrogen receptor positive status [ER+]: Secondary | ICD-10-CM

## 2019-04-29 NOTE — Telephone Encounter (Signed)
-----   Message from Alla Feeling, NP sent at 04/29/2019 10:32 AM EDT ----- Could you please try to reach patient to encourage her to schedule mammogram and bone density exams at the breast center prior to her rescheduled visit on 7/23. Or contact the interpreter to do so.  Thanks, Regan Rakers

## 2019-04-29 NOTE — Telephone Encounter (Signed)
Spoke with son Garnette Scheuermann [phone] advised that pt missed appointment today and we will reschedule for 7/23.  Advised son of note below per Cira Rue NP:  Could you please try to reach patient to encourage her to schedule mammogram and bone density exams at the breast center prior to her rescheduled visit on 7/23. Or contact the interpreter to do so.   Son verbalized understanding.

## 2019-05-11 ENCOUNTER — Other Ambulatory Visit: Payer: Self-pay | Admitting: Hematology

## 2019-05-14 NOTE — Progress Notes (Signed)
CLINIC:  Survivorship   REASON FOR VISIT:  Routine follow-up post-treatment for a recent history of breast cancer.  Patient Care Team: Lin Landsman, MD as PCP - General (Family Medicine) Alda Berthold, DO as Consulting Physician (Neurology) Truitt Merle, MD as Consulting Physician (Hematology) Stark Klein, MD as Consulting Physician (General Surgery) Eppie Gibson, MD as Attending Physician (Radiation Oncology) Alla Feeling, NP as Nurse Practitioner (Nurse Practitioner)  BRIEF ONCOLOGIC HISTORY:  Oncology History Overview Note  Cancer Staging Malignant neoplasm of 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, estrogen receptor positive ([place]) Staging form: Breast, AJCC 8th Edition - Clinical stage from 02/16/2018: Stage IB (cT2(m), cN0, cM0, G2, ER+, PR+, HER2-) - Signed by Truitt Merle, MD on 03/11/2018    Malignant neoplasm of 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, estrogen receptor positive ([place])  01/30/2018 Mammogram   Screening mammogram FINDINGS: In the right breast, possible distortion warrants further evaluation. The patient has had an excisional biopsy of her right breast upper outer quadrant in 2007, however this distortion is either better seen or new from her prior mammogram, and it does not quite match the surgical site as marked by a skin scar marker. In the left breast, no findings suspicious for malignancy.   02/14/2018 Mammogram   Diagnostic mammogram: In the lateral aspect of the right breast, posterior depth there is a prominent area of distortion at the approximate 9 o'clock location. While this is in the region of the excisional biopsy that the patient had in 2007, the distortion has become significantly more prominent as compared to the tomosynthesis mammogram performed in September of 2016, and appears to be inferior to the surgical scar.   02/14/2018 Breast US   Ultrasound of the right breast at 8:30, 4 cm from the nipple demonstrates an irregular  hypoechoic vascular mass with spiculated margins measuring 2.1 x 2.0 x 1.8 cm. There is a small adjacent mass at 9 o'clock, 4 cm from the nipple, which is 1.2 cm away from the dominant mass. The smaller mass measures 1.1 x 0.7 x 0.7 cm. Together, the 2 masses span 3.6 cm of tissue. Ultrasound of the right axilla demonstrates multiple normal-appearing lymph nodes.  IMPRESSION: 1. There is a highly suspicious mass in the right breast at 830 with a small possible satellite lesion at 9 o'clock. 2.  No evidence of right axillary lymphadenopathy.   02/16/2018 Cancer Staging   Staging form: Breast, AJCC 8th Edition - Clinical stage from 02/16/2018: Stage IB (cT2(m), cN0, cM0, G2, ER+, PR+, HER2-) - Signed by Truitt Merle, MD on 03/11/2018   02/16/2018 Initial Biopsy   Diagnosis 1. Breast, right, needle core biopsy, 8:30 o'clock - INVASIVE DUCTAL CARCINOMA, SEE COMMENT. 2. Breast, right, needle core biopsy, 9 o'clock - INVASIVE DUCTAL CARCINOMA. - LOBULAR NEOPLASIA (ATYPICAL LOBULAR HYPERPLASIA).  ER: 95%, positive, strong staining PR: 80%, positive, moderate staining  Proliferation Marker Ki67: 2%  HER2 - Negative Ratio of HER2/CEP17 signals: 1.80 Average HER2 copy number per cell: 2.97    03/01/2018 Imaging   MR Bilateral breast IMPRESSION: Two areas of known malignancy in the LATERAL portion of the RIGHT breast. No additional areas of concern in either breast. No axillary adenopathy.   03/11/2018 Initial Diagnosis   Malignant neoplasm of 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, estrogen receptor positive ([place])   04/05/2018 Surgery   RIGHT BREAST SEED BRACKETED LUMPECTOMY WITH SENTINEL LYMPH NODE BIOPSY by Dr. Barry Dienes 04/05/18   04/05/2018 Pathology Results   Surgery  Diagnosis 04/05/18  1. Breast, lumpectomy, right - INVASIVE DUCTAL CARCINOMA GRADE I/III, TWO FOCI SPANNING 2.9 CM AND 0.9 CM. - LOBULAR NEOPLASIA (LOBULAR CARCINOMA IN SITU). - DUCTAL CARCINOMA IN SITU, LOW GRADE. -  LYMPHOVASCULAR INVASION IS IDENTIFIED. - THE SURGICAL RESECTION MARGINS ARE NEGATIVE FOR DUCTAL CARCINOMA. - SEE ONCOLOGY TABLE BELOW. 2. Lymph node, sentinel, biopsy, right - THERE IS NO EVIDENCE OF CARCINOMA IN 1 OF 1 LYMPH NODE (0/1). 3. Lymph node, sentinel, biopsy, right - METASTATIC CARCINOMA IN 1 OF 1 LYMPH NODE (1/1) WITH EXTRACAPSULAR EXTENSION. - EXTRACAPSULAR EXTENSION EXTENDS 0.3 CM. 4. Lymph node, sentinel, biopsy, right - DUCTAL CARCINOMA. - PERINEURAL INVASION IS IDENTIFIED. - SEE COMMENT. 5. Lymph node, sentinel, biopsy, right 1 of 4   04/05/2018 Miscellaneous   Mammaprint Low Risk with 10% risk of recurrance with Tamoxifen alone  MPI at +0.240 DMFI at 97.8%   04/12/2018 Cancer Staging   Staging form: Breast, AJCC 8th Edition - Pathologic: Stage IA (pT2, pN1a, cM0, G1, ER+, PR+, HER2-) - Signed by Truitt Merle, MD on 04/12/2018   04/13/2018 Imaging   Whole Body Bone scan 04/13/18 IMPRESSION: No scintigraphic evidence of osseous metastatic disease.   04/16/2018 Imaging   CT CAP W Contrast 04/16/18  IMPRESSION: 1. No definite findings of metastatic disease in the chest. Solitary solid 4 mm right lower lobe pulmonary nodule, for which initial chest CT follow-up is advised in 3 months. 2. Postsurgical changes from right breast lumpectomy and right axillary node dissection. Thin-walled fluid collections in the right breast and right axilla are most compatible with postsurgical seromas. 3. No evidence of metastatic disease in the abdomen or pelvis.    04/17/2018 Surgery   AXILLARY LYMPH NODE DISSECTION by Dr. Barry Dienes 04/17/18     04/17/2018 Pathology Results   Diagnosis 04/17/18  Lymph nodes, regional resection, Right Axillary - BENIGN FIBROADIPOSE AND BREAST TISSUE WITH FAT NECROSIS. - NO LYMPHOID TISSUE PRESENT. - NO MALIGNANCY IDENTIFIED. Microscopic Comment The entire specimen was submitted.   05/28/2018 - 07/10/2018 Radiation Therapy   Radiation treatment dates:    05/28/2018-07/10/2018  Site/dose:    1. Right breast, 2 Gy in 25 fractions for a total dose of 50 Gy 2. Right SCV, 2 Gy in 25 fractions for a total dose of 50 Gy 3. Right breast boost, 2 Gy in 5 fractions for a total dose of 5 Gy           07/24/2018 -  Anti-estrogen oral therapy   Letrozole 2.74m daily starting 07/24/18    Survivorship   Per LCira Rue NP      INTERVAL HISTORY:  [facility] to review her survivorship care plan detailing her treatment course for breast cancer, as well as monitoring long-term side effects of that treatment, education regarding health maintenance, screening, and overall wellness and health promotion.     Overall, Amy Roman is doing well. She completed radiation on 07/10/18 and began letrozole on 07/24/2018. She was last seen on 07/23/2018. Her skin remains dark. She has intermittent right breast pain. She has mild hot flashes that are tolerable. 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 She remains functional. She has had neuropathy for many years; developed severe pain and numbness in her hands after trying to discontinue gabapentin, symptoms resolved when she started back. She was out of prozac for a month due to pharmacy refill issue. She noticed increased sadness, loneliness, emptiness, and depression when she was  off medication. Symptoms improved once she restarted, she notes her depression is well managed when on therapy. She denies SI. For past 2 years she reports nocturnal urinary incontinence; she denies dysuria, urgency, frequency, or hematuria. Denies daytime incontinence.     REVIEW OF SYSTEMS:  Review of Systems  Constitutional: Positive for fatigue. Negative for appetite change, chills and fever.  Respiratory: Negative for chest tightness, cough and shortness of breath.   Cardiovascular: Negative for chest pain.  Gastrointestinal: Negative for abdominal pain, blood in  stool, constipation, diarrhea, nausea and vomiting.  Genitourinary: Positive for bladder incontinence. Negative for dysuria, frequency, hematuria and vaginal bleeding.   Musculoskeletal: Positive for arthralgias.  Neurological: Positive for numbness.  Psychiatric/Behavioral: Positive for depression. Negative for suicidal ideas.   Breast: Denies any new nodularity, masses, tenderness, nipple changes, or nipple discharge.      ONCOLOGY TREATMENT TEAM:  1. Surgeon:  Dr. Barry Dienes at [facility] 2. Medical Oncologist: Dr. Burr Medico  3. Radiation Oncologist: Dr. Isidore Moos    PAST MEDICAL/SURGICAL HISTORY:  Past Medical History:  Diagnosis Date   Anemia    Anxiety and depression 10/15/2014   Arthritis    BILATERAL KNEES, HAD CORTISONE INJECTION 03/2017   Brain cancer ([place])    Depression    GERD (gastroesophageal reflux disease)    OCCASIONAL WITH CERTAIN FOOD   Headache    History of radiation therapy 05/28/18- 07/10/18   Right Breast 25 fractions for a total dose of 50 Gy, Right SCV and PAB 25 fractions for a total dose of 50 Gy, right breast boost, 5 fractions for a total dose of 10 Gy.    Seasonal allergies    SVD (spontaneous vaginal delivery)    x 4   Unspecified hereditary and idiopathic peripheral neuropathy 05/14/2014   LEFT KNEE AND FOOT   Past Surgical History:  Procedure Laterality Date   AXILLARY LYMPH NODE DISSECTION Right 04/17/2018   Procedure: AXILLARY LYMPH NODE DISSECTION;  Surgeon: Stark Klein, MD;  Location: [place];  Service: General;  Laterality: Right;   BREAST EXCISIONAL BIOPSY Right    BREAST LUMPECTOMY  2007   RIGHT BREAST   BREAST LUMPECTOMY WITH RADIOACTIVE SEED AND SENTINEL LYMPH NODE BIOPSY Right 04/05/2018   Procedure: RIGHT BREAST SEED BRACKETED LUMPECTOMY WITH SENTINEL LYMPH NODE BIOPSY;  Surgeon: Stark Klein, MD;  Location: [place];  Service: General;  Laterality: Right;   BREAST SURGERY      bx, no cancer    CATARACT EXTRACTION Bilateral    DILATATION & CURETTAGE/HYSTEROSCOPY WITH MYOSURE N/A 08/09/2017   Procedure: [place];  Surgeon: Emily Filbert, MD;  Location: [facility];  Service: Gynecology;  Laterality: N/A;   LABIOPLASTY  03/06/2018   Procedure: LABIAPLASTY;  Surgeon: Emily Filbert, MD;  Location: [facility];  Service: Gynecology;;   VAGINAL HYSTERECTOMY Bilateral 03/06/2018   Procedure: HYSTERECTOMY VAGINAL WITH BILATERAL SALPINGOOPHORECTOMY;  Surgeon: Emily Filbert, MD;  Location: [facility];  Service: Gynecology;  Laterality: Bilateral;     ALLERGIES:  No Known Allergies   CURRENT MEDICATIONS:  Outpatient Encounter Medications as of 05/16/2019  Medication Sig Note   b complex vitamins tablet Take 1 tablet by mouth daily.    ferrous sulfate 325 (65 FE) MG EC tablet Take 325 mg by mouth 3 (three) times daily with meals.    FLUoxetine (PROZAC) 40 MG capsule Take 40 mg by mouth daily.    gabapentin (NEURONTIN) 800 MG tablet Take 200  mg by mouth 4 (four) times daily as needed.  03/06/2018: Pt cuts tablet into 4 pieces and only take 1 piece at a time   letrozole (FEMARA) 2.5 MG tablet Take 1 tablet (2.5 mg total) by mouth daily.    traZODone (DESYREL) 100 MG tablet Take 100 mg by mouth at bedtime.    [DISCONTINUED] letrozole (FEMARA) 2.5 MG tablet TAKE 1 TABLET BY MOUTH DAILY    bismuth subsalicylate (PEPTO BISMOL) 262 MG chewable tablet Chew 524 mg by mouth as needed.    bismuth-metronidazole-tetracycline (PYLERA) 140-125-125 MG capsule Take 3 capsules by mouth 4 (four) times daily -  before meals and at bedtime.    omeprazole (PRILOSEC) 40 MG capsule Take 1 capsule (40 mg total) by mouth 2 (two) times daily for 10 days.    No facility-administered encounter medications on file as of 05/16/2019.      ONCOLOGIC FAMILY HISTORY:  Family History  Problem Relation Age of Onset   Diabetes Mother    Neuropathy Mother     Neuropathy Sister    Cancer Sister        unknown type cancer in neck    Other Father    Cancer Maternal Aunt        leukemia    Colon cancer Neg Hx    Breast cancer Neg Hx      GENETIC COUNSELING/TESTING: None  SOCIAL HISTORY:  Amy Roman is married and lives in [place].  She enjoys times with 4 grandchildren. Amy Roman is not currently working.  She denies any current or history of tobacco, alcohol, or illicit drug use.      PHYSICAL EXAMINATION:  Vital Signs:   Vitals:   05/16/19 0953  BP: 140/74  Pulse: 64  Resp: 18  Temp: 98.2 F (36.8 C)  SpO2: 100%   Filed Weights   05/16/19 0953  Weight: 197 lb 8 oz (89.6 kg)   General: Well-nourished, well-appearing female in no acute distress.  She is accompanied in clinic by an Arabic interpreter    HEENT:  Sclerae anicteric.  Lymph: No cervical, supraclavicular, or infraclavicular lymphadenopathy noted on palpation.  Cardiovascular: Regular rate and rhythm. Respiratory: Clear to auscultation bilaterally. breathing non-labored.  GI: Abdomen soft and round; non-tender, non-distended. Bowel sounds normoactive.  Neuro: No focal deficits. Steady gait, required assist to exam table  Psych: Mood and affect normal and appropriate for situation.  MSK: No focal spinal tenderness to palpation.  Full range of motion in bilateral upper extremities Skin: Warm and dry. Breast: s/p right lumpectomy. Incisions have completely healed. No palpable mass in either breast or axilla that I could appreciate   LABORATORY DATA:  CBC Latest Ref Rng & Units 05/16/2019 08/03/2018 08/03/2018  WBC 4.0 - 10.5 K/uL 4.1 3.0(L) -  Hemoglobin 12.0 - 15.0 g/dL 11.9(L) 10.3(L) -  Hematocrit 36.0 - 46.0 % 36.4 34.0(L) 36.1  Platelets 150 - 400 K/uL 237 227 -   CMP Latest Ref Rng & Units 05/16/2019 08/03/2018 07/17/2018  Glucose 70 - 99 mg/dL 90 116(H) 111(H)  BUN 8 - 23 mg/dL 26(H) 16 23  Creatinine 0.44 - 1.00 mg/dL 0.91 0.89  0.98  Sodium 135 - 145 mmol/L 141 143 139  Potassium 3.5 - 5.1 mmol/L 4.5 4.2 4.5  Chloride 98 - 111 mmol/L 109 109 104  CO2 22 - 32 mmol/L '22 24 26  ' Calcium 8.9 - 10.3 mg/dL 10.2 10.0 9.6  Total Protein 6.5 - 8.1 g/dL 7.3 7.1 6.9  Total Bilirubin 0.3 - 1.2 mg/dL 0.3 0.3 <0.2(L)  Alkaline Phos 38 - 126 U/L 127(H) 110 103  AST 15 - 41 U/L '25 23 24  ' ALT 0 - 44 U/L '18 20 15    ' DIAGNOSTIC IMAGING:  None for this visit.      ASSESSMENT AND PLAN:  Amy Roman is a pleasant 68 y.o. female with Stage IA right breast invasive ductal carcinoma, ER+/PR+/HER2-, diagnosed in 02/16/2018, treated with lumpectomy, adjuvant radiation therapy, and anti-estrogen therapy with letrozole beginning in 07/2018.  She presents to the Survivorship Clinic for our initial meeting and routine follow-up post-completion of treatment for breast cancer.    1. Stage IA right breast cancer:  Amy Roman is has recovered from definitive treatment for breast cancer. Breast exam is unremarkable today, no clinical concern for recurrence. She will follow-up with her medical oncologist, Dr. Burr Medico in 08/2019 with history and physical exam per surveillance protocol.  She will continue her anti-estrogen therapy with letrozole. Thus far, she is tolerating well with tolerable hot flash. She has bone/joint pain that is partly related to arthritis. She does have depression that is overall controlled when she is compliant with medication. I strongly urged her to take fluoxetine daily as prescribed. She is followed by PCP. She was instructed to make Dr. Burr Medico or myself aware if she begins to experience any worsening side effects of the medication and I could see her back in clinic to help manage those side effects, as needed. She is overdue for mammogram which is now scheduled on 7/28. Today, a comprehensive survivorship care plan and treatment summary was reviewed with the patient today detailing her breast cancer diagnosis,  treatment course, potential late/long-term effects of treatment, appropriate follow-up care with recommendations for the future, and patient education resources.  A copy of this summary, along with a letter will be sent to the patients primary care provider via mail/fax/In Basket message after todays visit.    2. Nocturnal enuresis: ongoing for 2 years; denies dysuria, frequency, urgency, or hematuria. She does pelvic exercise. I recommend she f/u with PCP.   3. H.Pylori: she was anemic at last f/u in 06/2018, Hg 8.2. labs consistent with iron deficiency. She received IV Feraheme and was referred to GI. Dr. Silverio Decamp performed EGD and colonoscopy, she was noted to be H.pylori positive. However she did not complete the treatment, she was depressed from her breast cancer therapy and overwhelmed. She reports she is ready to comply with treatment now. I will send a note to Dr. Silverio Decamp.   4. Depression: she expressed thoughts of emptiness, loneliness, sadness, and depression while off fluoxetine for 1 month, she had refill issue at the pharmacy. She is followed by PCP for this. Denies SI. Her depression is controlled when on medication. I strongly urged her to continue as prescribed, and f/u with PCP. She also turns to her religion for support. She agrees.   5. Bone health:  Given Amy Roman's age/history of breast cancer and her current treatment regimen including anti-estrogen therapy with letrozole, she is at risk for bone demineralization.  She has not had baseline DEXA, I recommend she have this done now with mammogram. I placed the order. In the meantime, she was encouraged to increase her consumption of foods rich in calcium, as well as increase her weight-bearing activities.  She was given education on specific activities to promote bone health.  6. Cancer screening:  Due to Amy Roman's history and her age, she should receive screening  for skin cancers, colon cancer, and gynecologic  cancers. Last colonoscopy done in 09/2018, on 10 year recall. She is s/p hysterectomy per Dr. Hulan Fray. The information and recommendations are listed on the patient's comprehensive care plan/treatment summary and were reviewed in detail with the patient.    7. Health maintenance and wellness promotion: Amy Roman was encouraged to consume 5-7 servings of fruits and vegetables per day. We reviewed the "Nutrition Rainbow" handout, as well as the handout "Take Control of Your Health and Reduce Your Cancer Risk" from the [place].  She was also encouraged to engage in moderate to vigorous exercise for 30 minutes per day most days of the week. We discussed the [employer] fitness program, which is designed for cancer survivors to help them become more physically fit after cancer treatments.  She was instructed to limit her alcohol consumption and continue to abstain from tobacco use.     8. Support services/counseling: It is not uncommon for this period of the patient's cancer care trajectory to be one of many emotions and stressors.  We discussed an opportunity for her to participate in the next session of [facility] ("Finding Your New Normal") support group series designed for patients after they have completed treatment.   Amy Roman was encouraged to take advantage of our many other support services programs, support groups, and/or counseling in coping with her new Roman as a cancer survivor after completing anti-cancer treatment.  She was offered support today through active listening and expressive supportive counseling.  She was given information regarding our available services and encouraged to contact me with any questions or for help enrolling in any of our support group/programs.    Dispo:   -Return to cancer center 08/2019 -Mammogram due 05/21/19 -Follow up with surgery as needed -DEXA with mammogram, order placed  -Message to GI re: H.pylori treatment -Lab today shows mild  anemia Hg 11.9, iron studies are normal, Ferritin 54. She responded well to oral iron therapy. I recommend she take multivitamin with iron  -f/u PCP for urinary issues  -Refill letrozole -She is welcome to return back to the Survivorship Clinic at any time; no additional follow-up needed at this time.  -Consider referral back to survivorship as a long-term survivor for continued surveillance  A total of (40) minutes of face-to-face time was spent with this patient with greater than 50% of that time in counseling and care-coordination.   Cira Rue, NP Survivorship Program [facility] [phone]   Note: PRIMARY CARE PROVIDER Lin Landsman, [place] [phone]

## 2019-05-16 ENCOUNTER — Encounter: Payer: Self-pay | Admitting: Nurse Practitioner

## 2019-05-16 ENCOUNTER — Inpatient Hospital Stay: Payer: Medicare Other | Attending: Nurse Practitioner | Admitting: Nurse Practitioner

## 2019-05-16 ENCOUNTER — Telehealth: Payer: Self-pay | Admitting: Nurse Practitioner

## 2019-05-16 ENCOUNTER — Inpatient Hospital Stay: Payer: Medicare Other

## 2019-05-16 ENCOUNTER — Other Ambulatory Visit: Payer: Self-pay

## 2019-05-16 VITALS — BP 140/74 | HR 64 | Temp 98.2°F | Resp 18 | Ht 61.0 in | Wt 197.5 lb

## 2019-05-16 DIAGNOSIS — D649 Anemia, unspecified: Secondary | ICD-10-CM | POA: Insufficient documentation

## 2019-05-16 DIAGNOSIS — N3944 Nocturnal enuresis: Secondary | ICD-10-CM | POA: Diagnosis not present

## 2019-05-16 DIAGNOSIS — E2839 Other primary ovarian failure: Secondary | ICD-10-CM

## 2019-05-16 DIAGNOSIS — D509 Iron deficiency anemia, unspecified: Secondary | ICD-10-CM

## 2019-05-16 DIAGNOSIS — C50511 Malignant neoplasm of lower-outer quadrant of right female breast: Secondary | ICD-10-CM | POA: Diagnosis not present

## 2019-05-16 DIAGNOSIS — Z170 Estrogen receptor positive status [ER+]: Secondary | ICD-10-CM | POA: Diagnosis not present

## 2019-05-16 DIAGNOSIS — Z79811 Long term (current) use of aromatase inhibitors: Secondary | ICD-10-CM | POA: Diagnosis not present

## 2019-05-16 LAB — CBC WITH DIFFERENTIAL (CANCER CENTER ONLY)
Abs Immature Granulocytes: 0.01 10*3/uL (ref 0.00–0.07)
Basophils Absolute: 0 10*3/uL (ref 0.0–0.1)
Basophils Relative: 1 %
Eosinophils Absolute: 0.1 10*3/uL (ref 0.0–0.5)
Eosinophils Relative: 3 %
HCT: 36.4 % (ref 36.0–46.0)
Hemoglobin: 11.9 g/dL — ABNORMAL LOW (ref 12.0–15.0)
Immature Granulocytes: 0 %
Lymphocytes Relative: 27 %
Lymphs Abs: 1.1 10*3/uL (ref 0.7–4.0)
MCH: 30.4 pg (ref 26.0–34.0)
MCHC: 32.7 g/dL (ref 30.0–36.0)
MCV: 93.1 fL (ref 80.0–100.0)
Monocytes Absolute: 0.5 10*3/uL (ref 0.1–1.0)
Monocytes Relative: 12 %
Neutro Abs: 2.3 10*3/uL (ref 1.7–7.7)
Neutrophils Relative %: 57 %
Platelet Count: 237 10*3/uL (ref 150–400)
RBC: 3.91 MIL/uL (ref 3.87–5.11)
RDW: 13.2 % (ref 11.5–15.5)
WBC Count: 4.1 10*3/uL (ref 4.0–10.5)
nRBC: 0 % (ref 0.0–0.2)

## 2019-05-16 LAB — CMP (CANCER CENTER ONLY)
ALT: 18 U/L (ref 0–44)
AST: 25 U/L (ref 15–41)
Albumin: 3.8 g/dL (ref 3.5–5.0)
Alkaline Phosphatase: 127 U/L — ABNORMAL HIGH (ref 38–126)
Anion gap: 10 (ref 5–15)
BUN: 26 mg/dL — ABNORMAL HIGH (ref 8–23)
CO2: 22 mmol/L (ref 22–32)
Calcium: 10.2 mg/dL (ref 8.9–10.3)
Chloride: 109 mmol/L (ref 98–111)
Creatinine: 0.91 mg/dL (ref 0.44–1.00)
GFR, Est AFR Am: >60 mL/min (ref >60)
GFR, Estimated: >60 mL/min (ref >60)
Glucose, Bld: 90 mg/dL (ref 70–99)
Potassium: 4.5 mmol/L (ref 3.5–5.1)
Sodium: 141 mmol/L (ref 135–145)
Total Bilirubin: 0.3 mg/dL (ref 0.3–1.2)
Total Protein: 7.3 g/dL (ref 6.5–8.1)

## 2019-05-16 LAB — IRON AND TIBC
Iron: 88 ug/dL (ref 41–142)
Saturation Ratios: 21 % (ref 21–57)
TIBC: 424 ug/dL (ref 236–444)
UIBC: 336 ug/dL (ref 120–384)

## 2019-05-16 LAB — FERRITIN: Ferritin: 54 ng/mL (ref 11–307)

## 2019-05-16 MED ORDER — LETROZOLE 2.5 MG PO TABS: 2.5000 mg | ORAL_TABLET | Freq: Every day | ORAL | 3 refills | Status: DC

## 2019-05-16 NOTE — Telephone Encounter (Signed)
Scheduled appt per 7/23 los. ° °Printed calendar and avs. °

## 2019-05-17 LAB — CANCER ANTIGEN 27.29: CA 27.29: 39.4 U/mL — ABNORMAL HIGH (ref 0.0–38.6)

## 2019-05-28 ENCOUNTER — Other Ambulatory Visit: Payer: Self-pay

## 2019-05-29 ENCOUNTER — Encounter: Payer: Self-pay | Admitting: Gastroenterology

## 2019-06-11 ENCOUNTER — Other Ambulatory Visit: Payer: Self-pay

## 2019-06-11 ENCOUNTER — Ambulatory Visit
Admission: RE | Admit: 2019-06-11 | Discharge: 2019-06-11 | Disposition: A | Discharge status: Home / Self Care | Payer: Medicare Other | Source: Ambulatory Visit | Attending: Nurse Practitioner | Admitting: Nurse Practitioner

## 2019-06-11 DIAGNOSIS — Z170 Estrogen receptor positive status [ER+]: Secondary | ICD-10-CM

## 2019-06-11 DIAGNOSIS — C50511 Malignant neoplasm of lower-outer quadrant of right female breast: Secondary | ICD-10-CM

## 2019-06-11 DIAGNOSIS — R922 Inconclusive mammogram: Secondary | ICD-10-CM | POA: Diagnosis not present

## 2019-06-11 HISTORY — DX: Personal history of irradiation: Z92.3

## 2019-06-20 DIAGNOSIS — F411 Generalized anxiety disorder: Secondary | ICD-10-CM | POA: Diagnosis not present

## 2019-06-20 DIAGNOSIS — F330 Major depressive disorder, recurrent, mild: Secondary | ICD-10-CM | POA: Diagnosis not present

## 2019-06-20 DIAGNOSIS — F331 Major depressive disorder, recurrent, moderate: Secondary | ICD-10-CM | POA: Diagnosis not present

## 2019-08-05 ENCOUNTER — Other Ambulatory Visit: Payer: Self-pay

## 2019-08-05 DIAGNOSIS — C50511 Malignant neoplasm of lower-outer quadrant of right female breast: Secondary | ICD-10-CM

## 2019-08-05 MED ORDER — LETROZOLE 2.5 MG PO TABS: 2.5000 mg | ORAL_TABLET | Freq: Every day | ORAL | 3 refills | Status: DC

## 2019-08-08 ENCOUNTER — Other Ambulatory Visit: Payer: Medicare Other

## 2019-09-13 ENCOUNTER — Telehealth: Payer: Self-pay | Admitting: Hematology

## 2019-09-13 NOTE — Telephone Encounter (Signed)
Called patient regarding [place] request for this to be virtual or phone visit, left a voicemail.

## 2019-09-16 ENCOUNTER — Inpatient Hospital Stay: Payer: Medicare Other | Admitting: Hematology

## 2019-09-16 ENCOUNTER — Inpatient Hospital Stay: Payer: Medicare Other | Attending: Family Medicine

## 2019-10-25 DIAGNOSIS — R011 Cardiac murmur, unspecified: Secondary | ICD-10-CM

## 2019-10-25 HISTORY — DX: Cardiac murmur, unspecified: R01.1

## 2019-10-29 ENCOUNTER — Other Ambulatory Visit: Payer: Medicare Other

## 2019-10-29 ENCOUNTER — Ambulatory Visit
Admission: RE | Admit: 2019-10-29 | Discharge: 2019-10-29 | Disposition: A | Discharge status: Home / Self Care | Payer: Medicare Other | Source: Ambulatory Visit | Attending: Nurse Practitioner | Admitting: Nurse Practitioner

## 2019-10-29 ENCOUNTER — Other Ambulatory Visit: Payer: Self-pay

## 2019-10-29 DIAGNOSIS — E2839 Other primary ovarian failure: Secondary | ICD-10-CM

## 2019-10-30 ENCOUNTER — Other Ambulatory Visit: Payer: Medicare Other

## 2019-11-01 ENCOUNTER — Telehealth: Payer: Self-pay | Admitting: *Deleted

## 2019-11-01 ENCOUNTER — Telehealth: Payer: Self-pay | Admitting: Hematology

## 2019-11-01 NOTE — Telephone Encounter (Signed)
Scheduled appt per 1/8 sch message - unable to reach pt .mailed reminder letter with appt date and time

## 2019-11-01 NOTE — Telephone Encounter (Signed)
-----   Message from Alla Feeling, NP sent at 11/01/2019  1:38 PM EST ----- Please let her know her bone density scan showed osteopenia, low bone density. Please have her take calcium and vitamin D every day, and try to do light exercise. This is probably related to letrozole. Please send schedule message for routine lab and f/u with [place] this month.  Thanks, Regan Rakers

## 2019-11-01 NOTE — Telephone Encounter (Signed)
Per Cira Rue NP, called pt son to notify him that pt bone density scan showed osteopenia, low bone density. Advised to take calcium and vitamin D daily and try to do light exercise. This is probably related to letrozole. Pt son verbalized understanding.

## 2019-11-11 NOTE — Progress Notes (Signed)
[place]   [phone] [phone]   Clinic Follow up Note   Patient Care Team: Lin Landsman, MD as PCP - General (Family Medicine) Truitt Merle, MD as Consulting Physician (Hematology) Stark Klein, MD as Consulting Physician (General Surgery) Eppie Gibson, MD as Attending Physician (Radiation Oncology) Alla Feeling, NP as Nurse Practitioner (Nurse Practitioner)  Date of Service:  11/15/2019  CHIEF COMPLAINT: Follow up for right breast cancer    SUMMARY OF ONCOLOGIC HISTORY: Oncology History Overview Note  Cancer Staging Malignant neoplasm of 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, estrogen receptor positive ([place]) Staging form: Breast, AJCC 8th Edition - Clinical stage from 02/16/2018: Stage IB (cT2(m), cN0, cM0, G2, ER+, PR+, HER2-) - Signed by Truitt Merle, MD on 03/11/2018    Malignant neoplasm of 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, estrogen receptor positive ([place])  01/30/2018 Mammogram   Screening mammogram FINDINGS: In the right breast, possible distortion warrants further evaluation. The patient has had an excisional biopsy of her right breast upper outer quadrant in 2007, however this distortion is either better seen or new from her prior mammogram, and it does not quite match the surgical site as marked by a skin scar marker. In the left breast, no findings suspicious for malignancy.   02/14/2018 Mammogram   Diagnostic mammogram: In the lateral aspect of the right breast, posterior depth there is a prominent area of distortion at the approximate 9 o'clock location. While this is in the region of the excisional biopsy that the patient had in 2007, the distortion has become significantly more prominent as compared to the tomosynthesis mammogram performed in September of 2016, and appears to be inferior to the surgical scar.   02/14/2018 Breast US   Ultrasound of the right breast at 8:30, 4 cm from the nipple demonstrates an  irregular hypoechoic vascular mass with spiculated margins measuring 2.1 x 2.0 x 1.8 cm. There is a small adjacent mass at 9 o'clock, 4 cm from the nipple, which is 1.2 cm away from the dominant mass. The smaller mass measures 1.1 x 0.7 x 0.7 cm. Together, the 2 masses span 3.6 cm of tissue. Ultrasound of the right axilla demonstrates multiple normal-appearing lymph nodes.  IMPRESSION: 1. There is a highly suspicious mass in the right breast at 830 with a small possible satellite lesion at 9 o'clock. 2.  No evidence of right axillary lymphadenopathy.   02/16/2018 Cancer Staging   Staging form: Breast, AJCC 8th Edition - Clinical stage from 02/16/2018: Stage IB (cT2(m), cN0, cM0, G2, ER+, PR+, HER2-) - Signed by Truitt Merle, MD on 03/11/2018   02/16/2018 Initial Biopsy   Diagnosis 1. Breast, right, needle core biopsy, 8:30 o'clock - INVASIVE DUCTAL CARCINOMA, SEE COMMENT. 2. Breast, right, needle core biopsy, 9 o'clock - INVASIVE DUCTAL CARCINOMA. - LOBULAR NEOPLASIA (ATYPICAL LOBULAR HYPERPLASIA).  ER: 95%, positive, strong staining PR: 80%, positive, moderate staining  Proliferation Marker Ki67: 2%  HER2 - Negative Ratio of HER2/CEP17 signals: 1.80 Average HER2 copy number per cell: 2.97    03/01/2018 Imaging   MR Bilateral breast IMPRESSION: Two areas of known malignancy in the LATERAL portion of the RIGHT breast. No additional areas of concern in either breast. No axillary adenopathy.   03/11/2018 Initial Diagnosis   Malignant neoplasm of 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, estrogen receptor positive ([place])   04/05/2018 Surgery   RIGHT BREAST SEED BRACKETED LUMPECTOMY WITH SENTINEL LYMPH NODE BIOPSY by Dr. Barry Dienes 04/05/18  04/05/2018 Pathology Results   Surgery  Diagnosis 04/05/18  1. Breast, lumpectomy, right - INVASIVE DUCTAL CARCINOMA GRADE I/III, TWO FOCI SPANNING 2.9 CM AND 0.9 CM. - LOBULAR NEOPLASIA (LOBULAR CARCINOMA IN SITU). - DUCTAL CARCINOMA IN SITU, LOW  GRADE. - LYMPHOVASCULAR INVASION IS IDENTIFIED. - THE SURGICAL RESECTION MARGINS ARE NEGATIVE FOR DUCTAL CARCINOMA. - SEE ONCOLOGY TABLE BELOW. 2. Lymph node, sentinel, biopsy, right - THERE IS NO EVIDENCE OF CARCINOMA IN 1 OF 1 LYMPH NODE (0/1). 3. Lymph node, sentinel, biopsy, right - METASTATIC CARCINOMA IN 1 OF 1 LYMPH NODE (1/1) WITH EXTRACAPSULAR EXTENSION. - EXTRACAPSULAR EXTENSION EXTENDS 0.3 CM. 4. Lymph node, sentinel, biopsy, right - DUCTAL CARCINOMA. - PERINEURAL INVASION IS IDENTIFIED. - SEE COMMENT. 5. Lymph node, sentinel, biopsy, right 1 of 4   04/05/2018 Miscellaneous   Mammaprint Low Risk with 10% risk of recurrance with Tamoxifen alone  MPI at +0.240 DMFI at 97.8%   04/12/2018 Cancer Staging   Staging form: Breast, AJCC 8th Edition - Pathologic: Stage IA (pT2, pN1a, cM0, G1, ER+, PR+, HER2-) - Signed by Truitt Merle, MD on 04/12/2018   04/13/2018 Imaging   Whole Body Bone scan 04/13/18 IMPRESSION: No scintigraphic evidence of osseous metastatic disease.   04/16/2018 Imaging   CT CAP W Contrast 04/16/18  IMPRESSION: 1. No definite findings of metastatic disease in the chest. Solitary solid 4 mm right lower lobe pulmonary nodule, for which initial chest CT follow-up is advised in 3 months. 2. Postsurgical changes from right breast lumpectomy and right axillary node dissection. Thin-walled fluid collections in the right breast and right axilla are most compatible with postsurgical seromas. 3. No evidence of metastatic disease in the abdomen or pelvis.    04/17/2018 Surgery   AXILLARY LYMPH NODE DISSECTION by Dr. Barry Dienes 04/17/18     04/17/2018 Pathology Results   Diagnosis 04/17/18  Lymph nodes, regional resection, Right Axillary - BENIGN FIBROADIPOSE AND BREAST TISSUE WITH FAT NECROSIS. - NO LYMPHOID TISSUE PRESENT. - NO MALIGNANCY IDENTIFIED. Microscopic Comment The entire specimen was submitted.   05/28/2018 - 07/10/2018 Radiation Therapy   Radiation  treatment dates:   05/28/2018-07/10/2018  Site/dose:    1. Right breast, 2 Gy in 25 fractions for a total dose of 50 Gy 2. Right SCV, 2 Gy in 25 fractions for a total dose of 50 Gy 3. Right breast boost, 2 Gy in 5 fractions for a total dose of 5 Gy           07/24/2018 -  Anti-estrogen oral therapy   Letrozole 2.22m daily starting 07/24/18    Survivorship   Per LCira Rue NP       CURRENT THERAPY:  Letrozole 2.550mdaily starting 07/2018   INTERVAL HISTORY:  Amy Roman is here for a follow up of tight breast cancer. She presents to the clinic with her interpretor. She notes she is doing well. She notes intermittent and less right breast pain from surgery. She has neuropathy in her hands and mildly in her feet. She takes Gabapentin 60059mt night and Vit B complex. This has worsened in the last 8-9 months. This does effects her sleep. She notes occasional neck pain. She talked to her PCP about this. She notes she has b/l knee pain from arthritis. This effects her walking. She is taking Calcium and D3.  She notes last year she was told by Dr. NanSilverio Decampe had H. Pylori. Prescription at that time was too expensive so she did not take it.  REVIEW OF SYSTEMS:   Constitutional: Denies fevers, chills or abnormal weight loss Eyes: Denies blurriness of vision Ears, nose, mouth, throat, and face: Denies mucositis or sore throat Respiratory: Denies cough, dyspnea or wheezes Cardiovascular: Denies palpitation, chest discomfort or lower extremity swelling Gastrointestinal:  Denies nausea, heartburn or change in bowel habits Skin: Denies abnormal skin rashes MSK: (+) B/l knee pain from arthritis  Lymphatics: Denies new lymphadenopathy or easy bruising Neurological: (+) Neuropathy significantly in her hands, mildly in her feet/legs  (+) improved neck pain Behavioral/Psych: Mood is stable, no new changes  Breast: (+) right breast pain, intermittent  All other systems were reviewed  with the patient and are negative.  MEDICAL HISTORY:  Past Medical History:  Diagnosis Date  . Anemia   . Anxiety and depression 10/15/2014  . Arthritis    BILATERAL KNEES, HAD CORTISONE INJECTION 03/2017  . Brain cancer ([place])   . Breast cancer ([place]) 2019   Right Breast Cancer  . Depression   . GERD (gastroesophageal reflux disease)    OCCASIONAL WITH CERTAIN FOOD  . Headache   . History of radiation therapy 05/28/18- 07/10/18   Right Breast 25 fractions for a total dose of 50 Gy, Right SCV and PAB 25 fractions for a total dose of 50 Gy, right breast boost, 5 fractions for a total dose of 10 Gy.   . Personal history of radiation therapy    Right Breast Cancer  . Seasonal allergies   . SVD (spontaneous vaginal delivery)    x 4  . Unspecified hereditary and idiopathic peripheral neuropathy 05/14/2014   LEFT KNEE AND FOOT    SURGICAL HISTORY: Past Surgical History:  Procedure Laterality Date  . AXILLARY LYMPH NODE DISSECTION Right 04/17/2018   Procedure: AXILLARY LYMPH NODE DISSECTION;  Surgeon: Stark Klein, MD;  Location: [facility];  Service: General;  Laterality: Right;  . BREAST EXCISIONAL BIOPSY Right 2007  . BREAST LUMPECTOMY Right 2019  . BREAST LUMPECTOMY WITH RADIOACTIVE SEED AND SENTINEL LYMPH NODE BIOPSY Right 04/05/2018   Procedure: RIGHT BREAST SEED BRACKETED LUMPECTOMY WITH SENTINEL LYMPH NODE BIOPSY;  Surgeon: Stark Klein, MD;  Location: [place];  Service: General;  Laterality: Right;  . BREAST SURGERY     bx, no cancer   . CATARACT EXTRACTION Bilateral   . DILATATION & CURETTAGE/HYSTEROSCOPY WITH MYOSURE N/A 08/09/2017   Procedure: DILATATION & CURETTAGE/HYSTEROSCOPY WITH MYOSURE;  Surgeon: Emily Filbert, MD;  Location: [facility];  Service: Gynecology;  Laterality: N/A;  . LABIOPLASTY  03/06/2018   Procedure: LABIAPLASTY;  Surgeon: Emily Filbert, MD;  Location: [facility];  Service: Gynecology;;  . VAGINAL HYSTERECTOMY Bilateral 03/06/2018    Procedure: HYSTERECTOMY VAGINAL WITH BILATERAL SALPINGOOPHORECTOMY;  Surgeon: Emily Filbert, MD;  Location: [facility];  Service: Gynecology;  Laterality: Bilateral;    I have reviewed the social history and family history with the patient and they are unchanged from previous note.  ALLERGIES:  has No Known Allergies.  MEDICATIONS:  Current Outpatient Medications  Medication Sig Dispense Refill  . b complex vitamins tablet Take 1 tablet by mouth daily.    Marland Kitchen bismuth subsalicylate (PEPTO BISMOL) 262 MG chewable tablet Chew 524 mg by mouth as needed.    . ferrous sulfate 325 (65 FE) MG EC tablet Take 325 mg by mouth 3 (three) times daily with meals.    . gabapentin (NEURONTIN) 800 MG tablet Take 200 mg by mouth 4 (four) times daily as needed.     Marland Kitchen  letrozole (FEMARA) 2.5 MG tablet Take 1 tablet (2.5 mg total) by mouth daily. 90 tablet 3  . bismuth-metronidazole-tetracycline (PYLERA) 140-125-125 MG capsule Take 3 capsules by mouth 4 (four) times daily -  before meals and at bedtime. (Patient not taking: Reported on 11/15/2019) 120 capsule 0  . FLUoxetine (PROZAC) 40 MG capsule Take 40 mg by mouth daily.    Marland Kitchen omeprazole (PRILOSEC) 40 MG capsule Take 1 capsule (40 mg total) by mouth 2 (two) times daily for 10 days. 20 capsule 0  . traZODone (DESYREL) 100 MG tablet Take 100 mg by mouth at bedtime.     No current facility-administered medications for this visit.    PHYSICAL EXAMINATION: ECOG PERFORMANCE STATUS: 1 - Symptomatic but completely ambulatory  Vitals:   11/15/19 1415  BP: (!) 144/55  Pulse: 68  Resp: 18  Temp: 98.5 F (36.9 C)  SpO2: 100%   Filed Weights   11/15/19 1415  Weight: 199 lb 12.8 oz (90.6 kg)    GENERAL:alert, no distress and comfortable SKIN: skin color, texture, turgor are normal, no rashes or significant lesions EYES: normal, Conjunctiva are pink and non-injected, sclera clear  NECK: supple, thyroid normal size, non-tender, without nodularity LYMPH:  no palpable  lymphadenopathy in the cervical, axillary  LUNGS: clear to auscultation and percussion with normal breathing effort HEART: regular rate & rhythm and no murmurs and no lower extremity edema ABDOMEN:abdomen soft, non-tender and normal bowel sounds Musculoskeletal:no cyanosis of digits and no clubbing  NEURO: alert & oriented x 3 with fluent speech, no focal motor/sensory deficits BREAST: s/p right lumpectomy: Surgical incision healed well with mild scar tissue. (+)Right breast skin retraction and hyperpigmentation. No palpable mass, nodules or adenopathy bilaterally. Breast exam benign.   LABORATORY DATA:  I have reviewed the data as listed CBC Latest Ref Rng & Units 11/15/2019 05/16/2019 08/03/2018  WBC 4.0 - 10.5 K/uL 5.2 4.1 3.0(L)  Hemoglobin 12.0 - 15.0 g/dL 11.7(L) 11.9(L) 10.3(L)  Hematocrit 36.0 - 46.0 % 36.0 36.4 34.0(L)  Platelets 150 - 400 K/uL 230 237 227     CMP Latest Ref Rng & Units 05/16/2019 08/03/2018 07/17/2018  Glucose 70 - 99 mg/dL 90 116(H) 111(H)  BUN 8 - 23 mg/dL 26(H) 16 23  Creatinine 0.44 - 1.00 mg/dL 0.91 0.89 0.98  Sodium 135 - 145 mmol/L 141 143 139  Potassium 3.5 - 5.1 mmol/L 4.5 4.2 4.5  Chloride 98 - 111 mmol/L 109 109 104  CO2 22 - 32 mmol/L '22 24 26  ' Calcium 8.9 - 10.3 mg/dL 10.2 10.0 9.6  Total Protein 6.5 - 8.1 g/dL 7.3 7.1 6.9  Total Bilirubin 0.3 - 1.2 mg/dL 0.3 0.3 <0.2(L)  Alkaline Phos 38 - 126 U/L 127(H) 110 103  AST 15 - 41 U/L '25 23 24  ' ALT 0 - 44 U/L '18 20 15      ' RADIOGRAPHIC STUDIES: I have personally reviewed the radiological images as listed and agreed with the findings in the report. No results found.   ASSESSMENT & PLAN:  Chianna Spirito Roman is a 69 y.o. female with    1. 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 She is s/p right breast lumpectomy, R axillary LN dissection and adjuvant RT.  -She  has clinical high risk disease, but her Mammoprint showed low risk luminal type A. Based on the test result, there is no benefit from adjuvant chemotherapy. I do  not recommend chemotherapy.  -Giving the strong ER and PR expression in her postmenopausal status, I started her on Letrozole starting 07/24/18. Tolerating well. Will continue for 5-10 years.  -She is clinically doing well. She has worsened neuropathy and improved right breast pain. Lab reviewed, her CBC and CMP are within normal limits. Her physical exam and her 05/2019 mammogram were unremarkable. There is no clinical concern for recurrence. -Continue surveillance. Next mammogram in 05/2020 -Continue Letrozole   -F/u in 6 months                                            2. Neuropathy  -Bilaterally, Mainly of hands and less in her LE. She notes this is chronic but has worsened in her hands in the past 8-9 month.  -This has started to effect her sleep due to pain. She currently is on 28m Gabapentin 3 tabs at night.  -She is not diabetic. Etiology is unclear. I recommend she see Neurologist for work up. She is agreeable.  -I will also check her B12 level at next visit.   3. Anemia -She was anemic in 06/2018, Hg 8.2. labs consistent with iron deficiency.  -She received IV Feraheme in 07/2018.  -GI workup (EGD and colonoscopy) with Dr. NSilverio Decampin 09/2018 showed non-bleeding internal hemorrhoids, H.pylori positive and sigmoid diverticulosis.  H. pylori treatment medicines were prescribed, but she was not able to afford it.  I will send a message to her GI for alternative cheaper regimen. -Hg at 11.7, Iron panel still pending today (11/15/19)  4. Bone Health  -I discussed after radiation she will proceed with AI which can effect her bone density. -Her baseline DEXA from 10/29/19 shows osteopenia with lowest T-score -2 at AP spine. Also low at hip with 1% risk of fracture in 10 years.  -I encouraged her to continue Calcium and Vit D3. I also  encouraged her to do weight bearing exercise like walking and lifting.    5. Osteoarthritis -She had significant arthritis in her b/l knees -She notes being offered knee replacement surgery by her orthopedist but she declined.  -I discussed AI use may effect her joint pain. Will monitor while on medication. Stable.    6. Anxiety, depression -Patient does take anti-depressants  -Stable   7. H. pylori  -Seen on 09/25/18 Colonoscopy/endoscopy by Dr. NSilverio Decampshe has H. pylori. She was prescribed medication in early 2020 but due to high copay she did not take it.  -I will contact Dr. NSilverio Decampabout cheaper prescription.   8. Lung nodules -she was found to have a 488mright LL lung nodule on 04/16/18 CT scan  -will repeat CT chest in next few weeks     Plan:  -Send neurology referral for Dr. VaMickeal Skinneror neuropathy  -will add B12 level  -Copy note to Dr NaSilverio Decampor H Pylori alternative treatment regimen  -Continue Letrozole  -CT chest wo contrast in next month  -Lab and F/u in 6 months    No problem-specific Assessment & Plan notes found for this encounter.   Orders Placed This Encounter  Procedures  . CT Chest Wo Contrast    Standing Status:   Future    Standing Expiration Date:   11/14/2020    Order Specific Question:   Preferred [facility]?    [facility]  Order Specific  Question:   Release to patient    Answer:   Immediate    Order Specific Question:   Radiology Contrast Protocol - do NOT remove file path    Answer:   [email or web address]  . Vitamin B12    Please add to lab draw today    Standing Status:   Future    Standing Expiration Date:   11/14/2020   All questions were answered. The patient knows to call the clinic with any problems, questions or concerns. No barriers to learning was detected. The total time spent in the appointment was 30 minutes.     Truitt Merle, MD 11/15/2019   I, Joslyn Devon, am  acting as scribe for Truitt Merle, MD.   I have reviewed the above documentation for accuracy and completeness, and I agree with the above.

## 2019-11-15 ENCOUNTER — Inpatient Hospital Stay: Payer: Medicare Other

## 2019-11-15 ENCOUNTER — Encounter: Payer: Self-pay | Admitting: Hematology

## 2019-11-15 ENCOUNTER — Inpatient Hospital Stay: Payer: Medicare Other | Attending: Family Medicine | Admitting: Hematology

## 2019-11-15 ENCOUNTER — Telehealth: Payer: Self-pay | Admitting: Hematology

## 2019-11-15 ENCOUNTER — Other Ambulatory Visit: Payer: Self-pay

## 2019-11-15 VITALS — BP 144/55 | HR 68 | Temp 98.5°F | Resp 18 | Ht 61.0 in | Wt 199.8 lb

## 2019-11-15 DIAGNOSIS — G629 Polyneuropathy, unspecified: Secondary | ICD-10-CM | POA: Diagnosis not present

## 2019-11-15 DIAGNOSIS — Z923 Personal history of irradiation: Secondary | ICD-10-CM | POA: Insufficient documentation

## 2019-11-15 DIAGNOSIS — Z79811 Long term (current) use of aromatase inhibitors: Secondary | ICD-10-CM | POA: Diagnosis not present

## 2019-11-15 DIAGNOSIS — C50511 Malignant neoplasm of lower-outer quadrant of right female breast: Secondary | ICD-10-CM

## 2019-11-15 DIAGNOSIS — D509 Iron deficiency anemia, unspecified: Secondary | ICD-10-CM

## 2019-11-15 DIAGNOSIS — F419 Anxiety disorder, unspecified: Secondary | ICD-10-CM | POA: Diagnosis not present

## 2019-11-15 DIAGNOSIS — G609 Hereditary and idiopathic neuropathy, unspecified: Secondary | ICD-10-CM

## 2019-11-15 DIAGNOSIS — Z170 Estrogen receptor positive status [ER+]: Secondary | ICD-10-CM | POA: Insufficient documentation

## 2019-11-15 DIAGNOSIS — D649 Anemia, unspecified: Secondary | ICD-10-CM | POA: Insufficient documentation

## 2019-11-15 DIAGNOSIS — M170 Bilateral primary osteoarthritis of knee: Secondary | ICD-10-CM | POA: Insufficient documentation

## 2019-11-15 DIAGNOSIS — G6289 Other specified polyneuropathies: Secondary | ICD-10-CM

## 2019-11-15 DIAGNOSIS — F329 Major depressive disorder, single episode, unspecified: Secondary | ICD-10-CM | POA: Insufficient documentation

## 2019-11-15 DIAGNOSIS — E538 Deficiency of other specified B group vitamins: Secondary | ICD-10-CM

## 2019-11-15 DIAGNOSIS — R918 Other nonspecific abnormal finding of lung field: Secondary | ICD-10-CM | POA: Diagnosis not present

## 2019-11-15 LAB — CBC WITH DIFFERENTIAL (CANCER CENTER ONLY)
Abs Immature Granulocytes: 0.03 10*3/uL (ref 0.00–0.07)
Basophils Absolute: 0 10*3/uL (ref 0.0–0.1)
Basophils Relative: 1 %
Eosinophils Absolute: 0.1 10*3/uL (ref 0.0–0.5)
Eosinophils Relative: 1 %
HCT: 36 % (ref 36.0–46.0)
Hemoglobin: 11.7 g/dL — ABNORMAL LOW (ref 12.0–15.0)
Immature Granulocytes: 1 %
Lymphocytes Relative: 31 %
Lymphs Abs: 1.6 10*3/uL (ref 0.7–4.0)
MCH: 30 pg (ref 26.0–34.0)
MCHC: 32.5 g/dL (ref 30.0–36.0)
MCV: 92.3 fL (ref 80.0–100.0)
Monocytes Absolute: 0.6 10*3/uL (ref 0.1–1.0)
Monocytes Relative: 12 %
Neutro Abs: 2.9 10*3/uL (ref 1.7–7.7)
Neutrophils Relative %: 54 %
Platelet Count: 230 10*3/uL (ref 150–400)
RBC: 3.9 MIL/uL (ref 3.87–5.11)
RDW: 13.5 % (ref 11.5–15.5)
WBC Count: 5.2 10*3/uL (ref 4.0–10.5)
nRBC: 0 % (ref 0.0–0.2)

## 2019-11-15 NOTE — Telephone Encounter (Signed)
Scheduled appt per 1/22 los.  Sent a message to HIM pool to get a calendar mailed out. 

## 2019-11-16 LAB — CANCER ANTIGEN 27.29: CA 27.29: 31.3 U/mL (ref 0.0–38.6)

## 2019-11-18 ENCOUNTER — Other Ambulatory Visit: Payer: Self-pay

## 2019-11-18 DIAGNOSIS — Z170 Estrogen receptor positive status [ER+]: Secondary | ICD-10-CM

## 2019-11-18 DIAGNOSIS — C50511 Malignant neoplasm of lower-outer quadrant of right female breast: Secondary | ICD-10-CM

## 2019-11-18 LAB — FERRITIN: Ferritin: 15 ng/mL (ref 11–307)

## 2019-11-18 LAB — IRON AND TIBC
Iron: 57 ug/dL (ref 41–142)
Saturation Ratios: 13 % — ABNORMAL LOW (ref 21–57)
TIBC: 445 ug/dL — ABNORMAL HIGH (ref 236–444)
UIBC: 388 ug/dL — ABNORMAL HIGH (ref 120–384)

## 2019-11-19 ENCOUNTER — Telehealth: Payer: Self-pay

## 2019-11-19 ENCOUNTER — Telehealth: Payer: Self-pay | Admitting: Internal Medicine

## 2019-11-19 NOTE — Telephone Encounter (Signed)
-----   Message from Mauri Pole, MD sent at 11/18/2019 11:55 AM EST ----- We will send her new prescription. Thank you for letting me know  Beth, can you please send prescription for Bismuth 524mg  , Flagyl 250mg  1 tablet four times daily, Doxycycline 100mg  1 capsule Twice daily X 10 days along with high dose PPI (omeprazole or Nexium 40mg  BID.  Will need to confirm eradication in 6 weeks by checking H.pylori stool Ag, off PPI for 2 weeks prior to the test.  Thanks VN ----- Message ----- From: Truitt Merle, MD Sent: 11/16/2019  12:12 AM EST To: Mauri Pole, MD  Dr. Silverio Decamp,  The H Pylori medications you called in 09/2018 was too expensive for her and she did not take it. She is wondering if you can prescribe a different regimen which will be covered by her insurance.  Thanks  Krista Blue

## 2019-11-19 NOTE — Telephone Encounter (Signed)
I cld and spoke to Amy Roman's son and scheduled her to see Dr. Mickeal Skinner on 2/9 at 1130am.

## 2019-11-19 NOTE — Telephone Encounter (Signed)
Called to the listed number for the son. (see the designated party release) No answer. No voicemail. Will have to try again.

## 2019-11-20 NOTE — Telephone Encounter (Signed)
Called different phone number listed. No answer. Left a message on the voicemail requesting a call back.

## 2019-11-21 ENCOUNTER — Other Ambulatory Visit: Payer: Self-pay

## 2019-11-21 MED ORDER — DOXYCYCLINE HYCLATE 100 MG PO CAPS: 100.0000 mg | ORAL_CAPSULE | Freq: Two times a day (BID) | ORAL | 0 refills | Status: AC

## 2019-11-21 MED ORDER — METRONIDAZOLE 250 MG PO TABS: 250.0000 mg | ORAL_TABLET | Freq: Four times a day (QID) | ORAL | 0 refills | Status: AC

## 2019-11-21 MED ORDER — CVS BISMUTH 262 MG PO TABS: 2.0000 | ORAL_TABLET | Freq: Four times a day (QID) | ORAL | 0 refills | Status: DC

## 2019-11-21 MED ORDER — DOXYCYCLINE HYCLATE 100 MG PO CAPS: 100.0000 mg | ORAL_CAPSULE | Freq: Two times a day (BID) | ORAL | 0 refills | Status: DC

## 2019-11-21 MED ORDER — METRONIDAZOLE 250 MG PO TABS: 250.0000 mg | ORAL_TABLET | Freq: Four times a day (QID) | ORAL | 0 refills | Status: DC

## 2019-11-21 MED ORDER — BISMUTH 262 MG PO CHEW: 2.0000 | CHEWABLE_TABLET | Freq: Four times a day (QID) | ORAL | 0 refills | Status: AC

## 2019-11-21 MED ORDER — OMEPRAZOLE 40 MG PO CPDR: 40.0000 mg | DELAYED_RELEASE_CAPSULE | Freq: Two times a day (BID) | ORAL | 0 refills | Status: DC

## 2019-11-21 NOTE — Telephone Encounter (Signed)
Spoke with son Theda Sers. Explained the medications for the patient to treat H Pylori. Confirmed the pharmacy to be [facility].

## 2019-11-25 ENCOUNTER — Other Ambulatory Visit: Payer: Self-pay

## 2019-11-25 NOTE — Telephone Encounter (Signed)
Pt son would like to speak to you about medication

## 2019-11-25 NOTE — Telephone Encounter (Signed)
Spoke with the pharamcist and was assured the patient's medications are ready for pick up. Called the son and told him the prescriptions could be picked up from the [facility].

## 2019-11-26 ENCOUNTER — Other Ambulatory Visit: Payer: Medicare Other

## 2019-11-27 ENCOUNTER — Telehealth: Payer: Self-pay | Admitting: Hematology

## 2019-11-27 NOTE — Telephone Encounter (Signed)
Called pt son per 1/28 sch message - pt son said he will call back . Unsure of when pt will want to come in for iron infusion .

## 2019-11-29 ENCOUNTER — Other Ambulatory Visit: Payer: Self-pay

## 2019-11-29 ENCOUNTER — Inpatient Hospital Stay: Payer: Medicare Other | Attending: Family Medicine

## 2019-11-29 ENCOUNTER — Ambulatory Visit (HOSPITAL_COMMUNITY)
Admission: RE | Admit: 2019-11-29 | Discharge: 2019-11-29 | Disposition: A | Discharge status: Home / Self Care | Payer: Medicare Other | Source: Ambulatory Visit | Attending: Hematology | Admitting: Hematology

## 2019-11-29 DIAGNOSIS — Z170 Estrogen receptor positive status [ER+]: Secondary | ICD-10-CM | POA: Insufficient documentation

## 2019-11-29 DIAGNOSIS — Z79811 Long term (current) use of aromatase inhibitors: Secondary | ICD-10-CM | POA: Diagnosis not present

## 2019-11-29 DIAGNOSIS — F329 Major depressive disorder, single episode, unspecified: Secondary | ICD-10-CM | POA: Insufficient documentation

## 2019-11-29 DIAGNOSIS — C50511 Malignant neoplasm of lower-outer quadrant of right female breast: Secondary | ICD-10-CM | POA: Insufficient documentation

## 2019-11-29 DIAGNOSIS — G6289 Other specified polyneuropathies: Secondary | ICD-10-CM

## 2019-11-29 DIAGNOSIS — G629 Polyneuropathy, unspecified: Secondary | ICD-10-CM | POA: Diagnosis not present

## 2019-11-29 DIAGNOSIS — K219 Gastro-esophageal reflux disease without esophagitis: Secondary | ICD-10-CM | POA: Diagnosis not present

## 2019-11-29 DIAGNOSIS — Z923 Personal history of irradiation: Secondary | ICD-10-CM | POA: Insufficient documentation

## 2019-11-29 DIAGNOSIS — E538 Deficiency of other specified B group vitamins: Secondary | ICD-10-CM

## 2019-11-29 DIAGNOSIS — Z79899 Other long term (current) drug therapy: Secondary | ICD-10-CM | POA: Diagnosis not present

## 2019-11-29 DIAGNOSIS — R918 Other nonspecific abnormal finding of lung field: Secondary | ICD-10-CM | POA: Diagnosis present

## 2019-11-29 DIAGNOSIS — F419 Anxiety disorder, unspecified: Secondary | ICD-10-CM | POA: Diagnosis not present

## 2019-11-29 LAB — CBC WITH DIFFERENTIAL (CANCER CENTER ONLY)
Abs Immature Granulocytes: 0.01 10*3/uL (ref 0.00–0.07)
Basophils Absolute: 0 10*3/uL (ref 0.0–0.1)
Basophils Relative: 0 %
Eosinophils Absolute: 0.1 10*3/uL (ref 0.0–0.5)
Eosinophils Relative: 2 %
HCT: 33.4 % — ABNORMAL LOW (ref 36.0–46.0)
Hemoglobin: 10.4 g/dL — ABNORMAL LOW (ref 12.0–15.0)
Immature Granulocytes: 0 %
Lymphocytes Relative: 20 %
Lymphs Abs: 1 10*3/uL (ref 0.7–4.0)
MCH: 29.4 pg (ref 26.0–34.0)
MCHC: 31.1 g/dL (ref 30.0–36.0)
MCV: 94.4 fL (ref 80.0–100.0)
Monocytes Absolute: 0.6 10*3/uL (ref 0.1–1.0)
Monocytes Relative: 12 %
Neutro Abs: 3.1 10*3/uL (ref 1.7–7.7)
Neutrophils Relative %: 66 %
Platelet Count: 240 10*3/uL (ref 150–400)
RBC: 3.54 MIL/uL — ABNORMAL LOW (ref 3.87–5.11)
RDW: 13.9 % (ref 11.5–15.5)
WBC Count: 4.8 10*3/uL (ref 4.0–10.5)
nRBC: 0 % (ref 0.0–0.2)

## 2019-11-29 LAB — CMP (CANCER CENTER ONLY)
ALT: 20 U/L (ref 0–44)
AST: 27 U/L (ref 15–41)
Albumin: 3.7 g/dL (ref 3.5–5.0)
Alkaline Phosphatase: 110 U/L (ref 38–126)
Anion gap: 8 (ref 5–15)
BUN: 22 mg/dL (ref 8–23)
CO2: 29 mmol/L (ref 22–32)
Calcium: 9.3 mg/dL (ref 8.9–10.3)
Chloride: 104 mmol/L (ref 98–111)
Creatinine: 0.97 mg/dL (ref 0.44–1.00)
GFR, Est AFR Am: >60 mL/min (ref >60)
GFR, Estimated: >60 mL/min (ref >60)
Glucose, Bld: 75 mg/dL (ref 70–99)
Potassium: 4.5 mmol/L (ref 3.5–5.1)
Sodium: 141 mmol/L (ref 135–145)
Total Bilirubin: 0.2 mg/dL — ABNORMAL LOW (ref 0.3–1.2)
Total Protein: 6.8 g/dL (ref 6.5–8.1)

## 2019-11-29 LAB — VITAMIN B12: Vitamin B-12: 632 pg/mL (ref 180–914)

## 2019-12-02 ENCOUNTER — Telehealth: Payer: Self-pay

## 2019-12-02 ENCOUNTER — Other Ambulatory Visit: Payer: Self-pay | Admitting: Gastroenterology

## 2019-12-02 NOTE — Telephone Encounter (Signed)
-----   Message from Truitt Merle, MD sent at 11/30/2019 12:17 PM EST ----- Let her know her anemia is slightly worse again, with low iron level. I will send a high priority message to see if they can schedule her for ov iron on 2/9 when she comes in to see Dr. Mickeal Skinner  Thanks   Krista Blue

## 2019-12-02 NOTE — Telephone Encounter (Signed)
I spoke with Mr. Loren Racer, Amy Roman's son.  I let him know that her iron is low and Dr. Burr Medico wants her to have an iron infusion. I told him she sent a scheduling request for infusion tomorrow when she comes to see Dr. Mickeal Skinner.  I told him our scheduling dept will call with the time.  He verbalized understanding.

## 2019-12-03 ENCOUNTER — Inpatient Hospital Stay (HOSPITAL_BASED_OUTPATIENT_CLINIC_OR_DEPARTMENT_OTHER): Payer: Medicare Other | Admitting: Internal Medicine

## 2019-12-03 ENCOUNTER — Other Ambulatory Visit: Payer: Self-pay

## 2019-12-03 VITALS — BP 146/65 | HR 65 | Temp 98.3°F | Resp 18 | Ht 61.0 in | Wt 204.0 lb

## 2019-12-03 DIAGNOSIS — C50511 Malignant neoplasm of lower-outer quadrant of right female breast: Secondary | ICD-10-CM | POA: Diagnosis not present

## 2019-12-03 DIAGNOSIS — G609 Hereditary and idiopathic neuropathy, unspecified: Secondary | ICD-10-CM | POA: Diagnosis not present

## 2019-12-03 MED ORDER — AMITRIPTYLINE HCL 25 MG PO TABS: 50.0000 mg | ORAL_TABLET | Freq: Every day | ORAL | 3 refills | Status: DC

## 2019-12-03 NOTE — Progress Notes (Signed)
[facility] [place], [place] [address] [phone]   New Patient Evaluation  Date of Service: 12/03/19 Patient Name: Amy Roman Patient MRN: [id number] Patient DOB: [birth year] Provider: Ventura Sellers, MD  Identifying Statement:  ALISEA MATTE is a 69 y.o. 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    Referring Provider: Lin Landsman, [place],  [place] [address]  Primary Cancer:  Oncologic History: Oncology History Overview Note  Cancer Staging Malignant neoplasm of 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, estrogen receptor positive ([facility]) Staging form: Breast, AJCC 8th Edition - Clinical stage from 02/16/2018: Stage IB (cT2(m), cN0, cM0, G2, ER+, PR+, HER2-) - Signed by Truitt Merle, MD on 03/11/2018    Malignant neoplasm of 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, estrogen receptor positive ([facility])  01/30/2018 Mammogram   Screening mammogram FINDINGS: In the right breast, possible distortion warrants further evaluation. The patient has had an excisional biopsy of her right breast upper outer quadrant in 2007, however this distortion is either better seen or new from her prior mammogram, and it does not quite match the surgical site as marked by a skin scar marker. In the left breast, no findings suspicious for malignancy.   02/14/2018 Mammogram   Diagnostic mammogram: In the lateral aspect of the right breast, posterior depth there is a prominent area of distortion at the approximate 9 o'clock location. While this is in the region of the excisional biopsy that the patient had in 2007, the distortion has become significantly more prominent as compared to the tomosynthesis mammogram performed in September of 2016, and appears to be inferior to the surgical  scar.   02/14/2018 Breast US   Ultrasound of the right breast at 8:30, 4 cm from the nipple demonstrates an irregular hypoechoic vascular mass with spiculated margins measuring 2.1 x 2.0 x 1.8 cm. There is a small adjacent mass at 9 o'clock, 4 cm from the nipple, which is 1.2 cm away from the dominant mass. The smaller mass measures 1.1 x 0.7 x 0.7 cm. Together, the 2 masses span 3.6 cm of tissue. Ultrasound of the right axilla demonstrates multiple normal-appearing lymph nodes.  IMPRESSION: 1. There is a highly suspicious mass in the right breast at 830 with a small possible satellite lesion at 9 o'clock. 2.  No evidence of right axillary lymphadenopathy.   02/16/2018 Cancer Staging   Staging form: Breast, AJCC 8th Edition - Clinical stage from 02/16/2018: Stage IB (cT2(m), cN0, cM0, G2, ER+, PR+, HER2-) - Signed by Truitt Merle, MD on 03/11/2018   02/16/2018 Initial Biopsy   Diagnosis 1. Breast, right, needle core biopsy, 8:30 o'clock - INVASIVE DUCTAL CARCINOMA, SEE COMMENT. 2. Breast, right, needle core biopsy, 9 o'clock - INVASIVE DUCTAL CARCINOMA. - LOBULAR NEOPLASIA (ATYPICAL LOBULAR HYPERPLASIA).  ER: 95%, positive, strong staining PR: 80%, positive, moderate staining  Proliferation Marker Ki67: 2%  HER2 - Negative Ratio of HER2/CEP17 signals: 1.80 Average HER2 copy number per cell: 2.97    03/01/2018 Imaging   MR Bilateral breast IMPRESSION: Two areas of known malignancy in the LATERAL portion of the RIGHT breast. No additional areas of concern in either breast. No axillary adenopathy.   03/11/2018 Initial Diagnosis   Malignant neoplasm of 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, estrogen receptor positive ([facility])   04/05/2018 Surgery   RIGHT BREAST SEED  BRACKETED LUMPECTOMY WITH SENTINEL LYMPH NODE BIOPSY by Dr. Barry Dienes 04/05/18   04/05/2018 Pathology Results   Surgery  Diagnosis 04/05/18  1. Breast, lumpectomy, right - INVASIVE DUCTAL CARCINOMA GRADE I/III, TWO FOCI  SPANNING 2.9 CM AND 0.9 CM. - LOBULAR NEOPLASIA (LOBULAR CARCINOMA IN SITU). - DUCTAL CARCINOMA IN SITU, LOW GRADE. - LYMPHOVASCULAR INVASION IS IDENTIFIED. - THE SURGICAL RESECTION MARGINS ARE NEGATIVE FOR DUCTAL CARCINOMA. - SEE ONCOLOGY TABLE BELOW. 2. Lymph node, sentinel, biopsy, right - THERE IS NO EVIDENCE OF CARCINOMA IN 1 OF 1 LYMPH NODE (0/1). 3. Lymph node, sentinel, biopsy, right - METASTATIC CARCINOMA IN 1 OF 1 LYMPH NODE (1/1) WITH EXTRACAPSULAR EXTENSION. - EXTRACAPSULAR EXTENSION EXTENDS 0.3 CM. 4. Lymph node, sentinel, biopsy, right - DUCTAL CARCINOMA. - PERINEURAL INVASION IS IDENTIFIED. - SEE COMMENT. 5. Lymph node, sentinel, biopsy, right 1 of 4   04/05/2018 Miscellaneous   Mammaprint Low Risk with 10% risk of recurrance with Tamoxifen alone  MPI at +0.240 DMFI at 97.8%   04/12/2018 Cancer Staging   Staging form: Breast, AJCC 8th Edition - Pathologic: Stage IA (pT2, pN1a, cM0, G1, ER+, PR+, HER2-) - Signed by Truitt Merle, MD on 04/12/2018   04/13/2018 Imaging   Whole Body Bone scan 04/13/18 IMPRESSION: No scintigraphic evidence of osseous metastatic disease.   04/16/2018 Imaging   CT CAP W Contrast 04/16/18  IMPRESSION: 1. No definite findings of metastatic disease in the chest. Solitary solid 4 mm right lower lobe pulmonary nodule, for which initial chest CT follow-up is advised in 3 months. 2. Postsurgical changes from right breast lumpectomy and right axillary node dissection. Thin-walled fluid collections in the right breast and right axilla are most compatible with postsurgical seromas. 3. No evidence of metastatic disease in the abdomen or pelvis.    04/17/2018 Surgery   AXILLARY LYMPH NODE DISSECTION by Dr. Barry Dienes 04/17/18     04/17/2018 Pathology Results   Diagnosis 04/17/18  Lymph nodes, regional resection, Right Axillary - BENIGN FIBROADIPOSE AND BREAST TISSUE WITH FAT NECROSIS. - NO LYMPHOID TISSUE PRESENT. - NO MALIGNANCY  IDENTIFIED. Microscopic Comment The entire specimen was submitted.   05/28/2018 - 07/10/2018 Radiation Therapy   Radiation treatment dates:   05/28/2018-07/10/2018  Site/dose:    1. Right breast, 2 Gy in 25 fractions for a total dose of 50 Gy 2. Right SCV, 2 Gy in 25 fractions for a total dose of 50 Gy 3. Right breast boost, 2 Gy in 5 fractions for a total dose of 5 Gy           07/24/2018 -  Anti-estrogen oral therapy   Letrozole 2.65m daily starting 07/24/18    Survivorship   Per LCira Rue NP      History of Present Illness: The patient's records from the referring physician were obtained and reviewed and the patient interviewed to confirm this HPI.  Amy Roman presents today to re-engage with provider regarding her long standing neuropathy.  She describes primarily pain symptoms, burning and pain of hands and feet.  There is numbness, though this is less prominent. This goes back at least 15 years and seems to be static or slightly progressive over that time period.  Most recently she had seen a neurologist in 2015.  She does ambulate independently and is fully functional at home.  She is tolerating anti-estrogen therapy well and has not needed chemotherapy for breast cancer.    Medications: Current Outpatient Medications on File Prior to Visit  Medication Sig Dispense Refill  .  b complex vitamins tablet Take 1 tablet by mouth daily.    . ferrous sulfate 325 (65 FE) MG EC tablet Take 325 mg by mouth 3 (three) times daily with meals.    . gabapentin (NEURONTIN) 800 MG tablet Take 200 mg by mouth 4 (four) times daily as needed.     Marland Kitchen letrozole (FEMARA) 2.5 MG tablet Take 1 tablet (2.5 mg total) by mouth daily. 90 tablet 3  . omeprazole (PRILOSEC) 40 MG capsule TAKE 1 CAPSULE(40 MG) BY MOUTH TWICE DAILY FOR 10 DAYS 20 capsule 0  . traZODone (DESYREL) 100 MG tablet Take 100 mg by mouth at bedtime.     No current facility-administered medications on file prior to visit.     Allergies: No Known Allergies Past Medical History:  Past Medical History:  Diagnosis Date  . Anemia   . Anxiety and depression 10/15/2014  . Arthritis    BILATERAL KNEES, HAD CORTISONE INJECTION 03/2017  . Brain cancer ([facility])   . Breast cancer ([facility]) 2019   Right Breast Cancer  . Depression   . GERD (gastroesophageal reflux disease)    OCCASIONAL WITH CERTAIN FOOD  . Headache   . History of radiation therapy 05/28/18- 07/10/18   Right Breast 25 fractions for a total dose of 50 Gy, Right SCV and PAB 25 fractions for a total dose of 50 Gy, right breast boost, 5 fractions for a total dose of 10 Gy.   . Personal history of radiation therapy    Right Breast Cancer  . Seasonal allergies   . SVD (spontaneous vaginal delivery)    x 4  . Unspecified hereditary and idiopathic peripheral neuropathy 05/14/2014   LEFT KNEE AND FOOT   Past Surgical History:  Past Surgical History:  Procedure Laterality Date  . AXILLARY LYMPH NODE DISSECTION Right 04/17/2018   Procedure: AXILLARY LYMPH NODE DISSECTION;  Surgeon: Stark Klein, MD;  Location: [facility];  Service: General;  Laterality: Right;  . BREAST EXCISIONAL BIOPSY Right 2007  . BREAST LUMPECTOMY Right 2019  . BREAST LUMPECTOMY WITH RADIOACTIVE SEED AND SENTINEL LYMPH NODE BIOPSY Right 04/05/2018   Procedure: RIGHT BREAST SEED BRACKETED LUMPECTOMY WITH SENTINEL LYMPH NODE BIOPSY;  Surgeon: Stark Klein, MD;  Location: [facility];  Service: General;  Laterality: Right;  . BREAST SURGERY     bx, no cancer   . CATARACT EXTRACTION Bilateral   . DILATATION & CURETTAGE/HYSTEROSCOPY WITH MYOSURE N/A 08/09/2017   Procedure: DILATATION & CURETTAGE/HYSTEROSCOPY WITH MYOSURE;  Surgeon: Emily Filbert, MD;  Location: [facility];  Service: Gynecology;  Laterality: N/A;  . LABIOPLASTY  03/06/2018   Procedure: LABIAPLASTY;  Surgeon: Emily Filbert, MD;  Location: [facility];  Service: Gynecology;;  . VAGINAL HYSTERECTOMY Bilateral  03/06/2018   Procedure: HYSTERECTOMY VAGINAL WITH BILATERAL SALPINGOOPHORECTOMY;  Surgeon: Emily Filbert, MD;  Location: [facility];  Service: Gynecology;  Laterality: Bilateral;   Social History:  Social History   Socioeconomic History  . Marital status: Married    Spouse name: Not on file  . Number of children: 4  . Years of education: Not on file  . Highest education level: Not on file  Occupational History  . Occupation: house   Tobacco Use  . Smoking status: Never Smoker  . Smokeless tobacco: Never Used  Substance and Sexual Activity  . Alcohol use: No  . Drug use: No  . Sexual activity: Not on file  Other Topics Concern  . Not on file  Social  History Narrative   Original from [place]   Moved to the [place] 2000   Widow    She lives with son.  She had four sons.         Social Determinants of Health   Financial Resource Strain:   . Difficulty of Paying Living Expenses: Not on file  Food Insecurity:   . Worried About Charity fundraiser in the Last Year: Not on file  . Ran Out of Food in the Last Year: Not on file  Transportation Needs:   . Lack of Transportation (Medical): Not on file  . Lack of Transportation (Non-Medical): Not on file  Physical Activity:   . Days of Exercise per Week: Not on file  . Minutes of Exercise per Session: Not on file  Stress:   . Feeling of Stress : Not on file  Social Connections:   . Frequency of Communication with Friends and Family: Not on file  . Frequency of Social Gatherings with Friends and Family: Not on file  . Attends Religious Services: Not on file  . Active Member of Clubs or Organizations: Not on file  . Attends Archivist Meetings: Not on file  . Marital Status: Not on file  Intimate Partner Violence:   . Fear of Current or Ex-Partner: Not on file  . Emotionally Abused: Not on file  . Physically Abused: Not on file  . Sexually Abused: Not on file   Family History:  Family History  Problem Relation Age  of Onset  . Diabetes Mother   . Neuropathy Mother   . Neuropathy Sister   . Cancer Sister        unknown type cancer in neck   . Other Father   . Cancer Maternal Aunt        leukemia   . Colon cancer Neg Hx   . Breast cancer Neg Hx     Review of Systems: Constitutional: Doesn't report fevers, chills or abnormal weight loss Eyes: Doesn't report blurriness of vision Ears, nose, mouth, throat, and face: Doesn't report sore throat Respiratory: Doesn't report cough, dyspnea or wheezes Cardiovascular: Doesn't report palpitation, chest discomfort  Gastrointestinal:  Doesn't report nausea, constipation, diarrhea GU: Doesn't report incontinence Skin: Doesn't report skin rashes Neurological: Per HPI Musculoskeletal: Doesn't report joint pain Behavioral/Psych: Doesn't report anxiety  Physical Exam: Vitals:   12/03/19 1203  BP: (!) 146/65  Pulse: 65  Resp: 18  Temp: 98.3 F (36.8 C)  SpO2: 100%   KPS: 80. General: Alert, cooperative, pleasant, in no acute distress Head: Normal EENT: No conjunctival injection or scleral icterus.  Lungs: Resp effort normal Cardiac: Regular rate Abdomen: Non-distended abdomen Skin: No rashes cyanosis or petechiae. Extremities: +lymphedema  Neurologic Exam: Mental Status: Awake, alert, attentive to examiner. Oriented to self and environment. Language is fluent with intact comprehension.  Cranial Nerves: Visual acuity is grossly normal. Visual fields are full. Extra-ocular movements intact. No ptosis. Face is symmetric Motor: Tone and bulk are normal. Power is full in both arms and legs. Reflexes are symmetric, no pathologic reflexes present.  Sensory: Stocking and glove sensory loss of temperature Gait: Normal.   Labs: I have reviewed the data as listed    Component Value Date/Time   NA 141 11/29/2019 1407   K 4.5 11/29/2019 1407   CL 104 11/29/2019 1407   CO2 29 11/29/2019 1407   GLUCOSE 75 11/29/2019 1407   BUN 22 11/29/2019 1407    CREATININE 0.97 11/29/2019 1407  CALCIUM 9.3 11/29/2019 1407   PROT 6.8 11/29/2019 1407   ALBUMIN 3.7 11/29/2019 1407   AST 27 11/29/2019 1407   ALT 20 11/29/2019 1407   ALKPHOS 110 11/29/2019 1407   BILITOT 0.2 (L) 11/29/2019 1407   GFRNONAA >60 11/29/2019 1407   GFRAA >60 11/29/2019 1407   Lab Results  Component Value Date   WBC 4.8 11/29/2019   NEUTROABS 3.1 11/29/2019   HGB 10.4 (L) 11/29/2019   HCT 33.4 (L) 11/29/2019   MCV 94.4 11/29/2019   PLT 240 11/29/2019     Assessment/Plan 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  This syndrome was worked up with serologies 5 years ago, all normal except Vit. B12 deficiency (since treated).  She understands that further workup is unlikely to have utility at this point.  With predominant small fiber findings NCS/EMG would likely not be contributory.   Goal at this stage will be management of pain symptoms.  We recommended continuing Gabapentin 343m TID, which is a well tolerated dose level.  She had increased lethargy on higher doses.   In addition, she expressed an interest in Elavil as an adjunct agent.  She understands she would need to discontinue Zoloft in order to start therapy with Elavil due to risk of serotonin syndrome.  Apparently she has spent long periods of time off Zoloft and is not wed to the drug.    We recommended starting with 582mHS and increasing to 7569mS if ineffective after 2 weeks    We spent twenty additional minutes teaching regarding the natural history, biology, and historical experience in the treatment of neurologic complications of cancer.   We appreciate the opportunity to participate in the care of Amy Roman should return to clinic in 3 months or sooner as needed for ongoing or worsening symptoms.  All questions were answered. The patient knows to call the clinic with any  problems, questions or concerns. No barriers to learning were detected.  The total time spent in the encounter was 40 minutes and more than 50% was on counseling and review of test results   ZacVentura SellersD Medical Director of Neuro-Oncology [facility] [place]/09/21 4:08 PM

## 2019-12-04 ENCOUNTER — Telehealth: Payer: Self-pay | Admitting: Internal Medicine

## 2019-12-04 ENCOUNTER — Telehealth: Payer: Self-pay

## 2019-12-04 NOTE — Telephone Encounter (Signed)
-----   Message from Truitt Merle, MD sent at 11/30/2019 12:14 PM EST ----- Please let pt know her CT scan result, small lung nodule is stable, no concerns, thanks   Truitt Merle  11/30/2019

## 2019-12-04 NOTE — Telephone Encounter (Signed)
Scheduled appt per 2/9 los.  Spoke with pt son and he is aware of the appt date and time.  Sent a message to HIM pool to get a calendar mailed out.

## 2019-12-04 NOTE — Telephone Encounter (Signed)
I let Mr Lennon Alstrom, pts son, know the ct scan results, the small lung nodule is stable.  I asked him the best days for his mother to get iron infusions.  He states that Tuesday afternoons are the best times for him to bring her.

## 2019-12-04 NOTE — Telephone Encounter (Deleted)
Scheduled appt per 2/9 los.  Sent a message to HIM pool to get a calendar mailed out. 

## 2019-12-06 ENCOUNTER — Telehealth: Payer: Self-pay

## 2019-12-06 NOTE — Telephone Encounter (Signed)
Scheduling message sent to schedule 1 iron infusion within next month.

## 2019-12-10 ENCOUNTER — Telehealth: Payer: Self-pay | Admitting: Hematology

## 2019-12-10 NOTE — Telephone Encounter (Signed)
Scheduled apt per 2/12 sch message - unable to reach pt son . Left message with appt date and time

## 2019-12-13 NOTE — Progress Notes (Signed)
Have tried several times to reach pt without success.  Talked to gentleman who said she was not available twice & he state that he will call back on Monday to let us know how to reach her.

## 2019-12-17 ENCOUNTER — Other Ambulatory Visit: Payer: Self-pay | Admitting: Nurse Practitioner

## 2019-12-17 ENCOUNTER — Other Ambulatory Visit: Payer: Self-pay | Admitting: Gastroenterology

## 2019-12-17 ENCOUNTER — Ambulatory Visit: Payer: Medicare Other

## 2019-12-27 ENCOUNTER — Other Ambulatory Visit: Payer: Self-pay | Admitting: Hematology

## 2019-12-27 ENCOUNTER — Telehealth: Payer: Self-pay | Admitting: Hematology

## 2019-12-27 DIAGNOSIS — C50511 Malignant neoplasm of lower-outer quadrant of right female breast: Secondary | ICD-10-CM

## 2019-12-27 NOTE — Telephone Encounter (Signed)
Scheduled appt per 3/5 sch msg. Pt's son confirmed the appt date and time.

## 2020-01-02 ENCOUNTER — Telehealth: Payer: Self-pay

## 2020-01-02 NOTE — Telephone Encounter (Signed)
Spoke to the son. Patient has completed the treatment for H Pylori. He states she is doing well. Explained the follow up testing. He will ask his mother to do the test. States she did not want to do the stool collection the last time, but he will ask her again. Lab order in Epic.

## 2020-01-07 ENCOUNTER — Other Ambulatory Visit: Payer: Self-pay

## 2020-01-07 ENCOUNTER — Encounter: Payer: Self-pay | Admitting: Internal Medicine

## 2020-01-07 ENCOUNTER — Inpatient Hospital Stay: Payer: Medicare Other | Attending: Family Medicine

## 2020-01-07 VITALS — BP 117/57 | HR 63 | Temp 97.7°F | Resp 16

## 2020-01-07 DIAGNOSIS — D509 Iron deficiency anemia, unspecified: Secondary | ICD-10-CM | POA: Insufficient documentation

## 2020-01-07 MED ORDER — SODIUM CHLORIDE 0.9 % IV SOLN
510.0000 mg | Freq: Once | INTRAVENOUS | Status: AC
  Administered 2020-01-07: 510 mg via INTRAVENOUS
  Filled 2020-01-07: qty 510

## 2020-01-07 MED ORDER — SODIUM CHLORIDE 0.9 % IV SOLN
Freq: Once | INTRAVENOUS | Status: AC
  Filled 2020-01-07: qty 250

## 2020-01-07 NOTE — Progress Notes (Signed)
Left my card with information regarding applying for the one-time $1000 [employer] grant on the back to give to [name] when she arranges her transportation.

## 2020-01-07 NOTE — Patient Instructions (Signed)

## 2020-01-31 ENCOUNTER — Other Ambulatory Visit: Payer: Self-pay | Admitting: Hematology

## 2020-01-31 DIAGNOSIS — C50511 Malignant neoplasm of lower-outer quadrant of right female breast: Secondary | ICD-10-CM

## 2020-03-05 ENCOUNTER — Other Ambulatory Visit: Payer: Self-pay

## 2020-03-05 ENCOUNTER — Inpatient Hospital Stay: Payer: Medicare Other | Attending: Family Medicine | Admitting: Internal Medicine

## 2020-03-05 ENCOUNTER — Ambulatory Visit: Payer: Medicare Other | Admitting: Internal Medicine

## 2020-03-05 VITALS — BP 139/67 | HR 72 | Temp 98.5°F | Resp 17 | Ht 61.0 in | Wt 209.7 lb

## 2020-03-05 DIAGNOSIS — Z923 Personal history of irradiation: Secondary | ICD-10-CM | POA: Diagnosis not present

## 2020-03-05 DIAGNOSIS — C50511 Malignant neoplasm of lower-outer quadrant of right female breast: Secondary | ICD-10-CM | POA: Insufficient documentation

## 2020-03-05 DIAGNOSIS — Z170 Estrogen receptor positive status [ER+]: Secondary | ICD-10-CM | POA: Insufficient documentation

## 2020-03-05 DIAGNOSIS — G629 Polyneuropathy, unspecified: Secondary | ICD-10-CM | POA: Insufficient documentation

## 2020-03-05 DIAGNOSIS — Z79811 Long term (current) use of aromatase inhibitors: Secondary | ICD-10-CM | POA: Insufficient documentation

## 2020-03-05 DIAGNOSIS — G609 Hereditary and idiopathic neuropathy, unspecified: Secondary | ICD-10-CM

## 2020-03-05 MED ORDER — AMITRIPTYLINE HCL 50 MG PO TABS: 50.0000 mg | ORAL_TABLET | Freq: Every day | ORAL | 3 refills | Status: DC

## 2020-03-05 MED ORDER — PREGABALIN 75 MG PO CAPS: 75.0000 mg | ORAL_CAPSULE | Freq: Two times a day (BID) | ORAL | 3 refills | Status: DC

## 2020-03-05 NOTE — Progress Notes (Signed)
[facility] [place], [place] [address] [phone]   Interval Evaluation  Date of Service: 03/05/20 Patient Name: Amy Roman Patient MRN: [id number] Patient DOB: 1951-10-06 Provider: Ventura Sellers, MD  Identifying Statement:  Amy Roman is a 68 y.o. 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]    Primary Cancer:  Oncologic History: Oncology History Overview Note  Cancer Staging Malignant neoplasm of 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, estrogen receptor positive ([place]) Staging form: Breast, AJCC 8th Edition - Clinical stage from 02/16/2018: Stage IB (cT2(m), cN0, cM0, G2, ER+, PR+, HER2-) - Signed by Truitt Merle, MD on 03/11/2018    Malignant neoplasm of 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, estrogen receptor positive ([place])  01/30/2018 Mammogram   Screening mammogram FINDINGS: In the right breast, possible distortion warrants further evaluation. The patient has had an excisional biopsy of her right breast upper outer quadrant in 2007, however this distortion is either better seen or new from her prior mammogram, and it does not quite match the surgical site as marked by a skin scar marker. In the left breast, no findings suspicious for malignancy.   02/14/2018 Mammogram   Diagnostic mammogram: In the lateral aspect of the right breast, posterior depth there is a prominent area of distortion at the approximate 9 o'clock location. While this is in the region of the excisional biopsy that the patient had in 2007, the distortion has become significantly more prominent as compared to the tomosynthesis mammogram performed in September of 2016, and appears to be inferior to the surgical scar.   02/14/2018 Breast US   Ultrasound of the right breast at 8:30, 4 cm from the nipple demonstrates an irregular hypoechoic vascular mass with spiculated margins measuring 2.1 x 2.0 x  1.8 cm. There is a small adjacent mass at 9 o'clock, 4 cm from the nipple, which is 1.2 cm away from the dominant mass. The smaller mass measures 1.1 x 0.7 x 0.7 cm. Together, the 2 masses span 3.6 cm of tissue. Ultrasound of the right axilla demonstrates multiple normal-appearing lymph nodes.  IMPRESSION: 1. There is a highly suspicious mass in the right breast at 830 with a small possible satellite lesion at 9 o'clock. 2.  No evidence of right axillary lymphadenopathy.   02/16/2018 Cancer Staging   Staging form: Breast, AJCC 8th Edition - Clinical stage from 02/16/2018: Stage IB (cT2(m), cN0, cM0, G2, ER+, PR+, HER2-) - Signed by Truitt Merle, MD on 03/11/2018   02/16/2018 Initial Biopsy   Diagnosis 1. Breast, right, needle core biopsy, 8:30 o'clock - INVASIVE DUCTAL CARCINOMA, SEE COMMENT. 2. Breast, right, needle core biopsy, 9 o'clock - INVASIVE DUCTAL CARCINOMA. - LOBULAR NEOPLASIA (ATYPICAL LOBULAR HYPERPLASIA).  ER: 95%, positive, strong staining PR: 80%, positive, moderate staining  Proliferation Marker Ki67: 2%  HER2 - Negative Ratio of HER2/CEP17 signals: 1.80 Average HER2 copy number per cell: 2.97    03/01/2018 Imaging   MR Bilateral breast IMPRESSION: Two areas of known malignancy in the LATERAL portion of the RIGHT breast. No additional areas of concern in either breast. No axillary adenopathy.   03/11/2018 Initial Diagnosis   Malignant neoplasm of 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, estrogen receptor positive ([place])   04/05/2018 Surgery   RIGHT BREAST SEED BRACKETED LUMPECTOMY WITH SENTINEL LYMPH NODE BIOPSY by Dr. Barry Dienes 04/05/18   04/05/2018 Pathology Results   Surgery  Diagnosis 04/05/18  1. Breast, lumpectomy, right -  INVASIVE DUCTAL CARCINOMA GRADE I/III, TWO FOCI SPANNING 2.9 CM AND 0.9 CM. - LOBULAR NEOPLASIA (LOBULAR CARCINOMA IN SITU). - DUCTAL CARCINOMA IN SITU, LOW GRADE. - LYMPHOVASCULAR INVASION IS IDENTIFIED. - THE SURGICAL RESECTION MARGINS  ARE NEGATIVE FOR DUCTAL CARCINOMA. - SEE ONCOLOGY TABLE BELOW. 2. Lymph node, sentinel, biopsy, right - THERE IS NO EVIDENCE OF CARCINOMA IN 1 OF 1 LYMPH NODE (0/1). 3. Lymph node, sentinel, biopsy, right - METASTATIC CARCINOMA IN 1 OF 1 LYMPH NODE (1/1) WITH EXTRACAPSULAR EXTENSION. - EXTRACAPSULAR EXTENSION EXTENDS 0.3 CM. 4. Lymph node, sentinel, biopsy, right - DUCTAL CARCINOMA. - PERINEURAL INVASION IS IDENTIFIED. - SEE COMMENT. 5. Lymph node, sentinel, biopsy, right 1 of 4   04/05/2018 Miscellaneous   Mammaprint Low Risk with 10% risk of recurrance with Tamoxifen alone  MPI at +0.240 DMFI at 97.8%   04/12/2018 Cancer Staging   Staging form: Breast, AJCC 8th Edition - Pathologic: Stage IA (pT2, pN1a, cM0, G1, ER+, PR+, HER2-) - Signed by Truitt Merle, MD on 04/12/2018   04/13/2018 Imaging   Whole Body Bone scan 04/13/18 IMPRESSION: No scintigraphic evidence of osseous metastatic disease.   04/16/2018 Imaging   CT CAP W Contrast 04/16/18  IMPRESSION: 1. No definite findings of metastatic disease in the chest. Solitary solid 4 mm right lower lobe pulmonary nodule, for which initial chest CT follow-up is advised in 3 months. 2. Postsurgical changes from right breast lumpectomy and right axillary node dissection. Thin-walled fluid collections in the right breast and right axilla are most compatible with postsurgical seromas. 3. No evidence of metastatic disease in the abdomen or pelvis.    04/17/2018 Surgery   AXILLARY LYMPH NODE DISSECTION by Dr. Barry Dienes 04/17/18     04/17/2018 Pathology Results   Diagnosis 04/17/18  Lymph nodes, regional resection, Right Axillary - BENIGN FIBROADIPOSE AND BREAST TISSUE WITH FAT NECROSIS. - NO LYMPHOID TISSUE PRESENT. - NO MALIGNANCY IDENTIFIED. Microscopic Comment The entire specimen was submitted.   05/28/2018 - 07/10/2018 Radiation Therapy   Radiation treatment dates:   05/28/2018-07/10/2018  Site/dose:    1. Right breast, 2 Gy in 25  fractions for a total dose of 50 Gy 2. Right SCV, 2 Gy in 25 fractions for a total dose of 50 Gy 3. Right breast boost, 2 Gy in 5 fractions for a total dose of 5 Gy           07/24/2018 -  Anti-estrogen oral therapy   Letrozole 2.64m daily starting 07/24/18    Survivorship   Per LCira Rue NP      Interval History:  Amy Roman presents today for follow up.  She did feel the elavil helped somewhat, but she is still having pain in her lower legs and hands.  Gabapentin has been poorly tolerated because of drowsiness side effects.  No seizures or headaches.  H+P (11/26/19): Patient presents today to re-engage with provider regarding her long standing neuropathy.  She describes primarily pain symptoms, burning and pain of hands and feet.  There is numbness, though this is less prominent. This goes back at least 15 years and seems to be static or slightly progressive over that time period.  Most recently she had seen a neurologist in 2015.  She does ambulate independently and is fully functional at home.  She is tolerating anti-estrogen therapy well and has not needed chemotherapy for breast cancer.    Medications: Current Outpatient Medications on File Prior to Visit  Medication Sig Dispense Refill  . b complex vitamins  tablet Take 1 tablet by mouth daily.    . ferrous sulfate 325 (65 FE) MG EC tablet Take 325 mg by mouth 3 (three) times daily with meals.    . gabapentin (NEURONTIN) 800 MG tablet Take 200 mg by mouth 4 (four) times daily as needed.     Marland Kitchen letrozole (FEMARA) 2.5 MG tablet TAKE 1 TABLET BY MOUTH DAILY 30 tablet 3  . omeprazole (PRILOSEC) 40 MG capsule TAKE 1 CAPSULE(40 MG) BY MOUTH TWICE DAILY FOR 10 DAYS 20 capsule 0  . traZODone (DESYREL) 100 MG tablet Take 100 mg by mouth at bedtime.     No current facility-administered medications on file prior to visit.    Allergies: No Known Allergies Past Medical History:  Past Medical History:  Diagnosis Date  . Anemia     . Anxiety and depression 10/15/2014  . Arthritis    BILATERAL KNEES, HAD CORTISONE INJECTION 03/2017  . Brain cancer ([facility])   . Breast cancer ([place]) 2019   Right Breast Cancer  . Depression   . GERD (gastroesophageal reflux disease)    OCCASIONAL WITH CERTAIN FOOD  . Headache   . History of radiation therapy 05/28/18- 07/10/18   Right Breast 25 fractions for a total dose of 50 Gy, Right SCV and PAB 25 fractions for a total dose of 50 Gy, right breast boost, 5 fractions for a total dose of 10 Gy.   . Personal history of radiation therapy    Right Breast Cancer  . Seasonal allergies   . SVD (spontaneous vaginal delivery)    x 4  . Unspecified hereditary and idiopathic peripheral neuropathy 05/14/2014   LEFT KNEE AND FOOT   Past Surgical History:  Past Surgical History:  Procedure Laterality Date  . AXILLARY LYMPH NODE DISSECTION Right 04/17/2018   Procedure: AXILLARY LYMPH NODE DISSECTION;  Surgeon: Stark Klein, MD;  Location: [facility];  Service: General;  Laterality: Right;  . BREAST EXCISIONAL BIOPSY Right 2007  . BREAST LUMPECTOMY Right 2019  . BREAST LUMPECTOMY WITH RADIOACTIVE SEED AND SENTINEL LYMPH NODE BIOPSY Right 04/05/2018   Procedure: RIGHT BREAST SEED BRACKETED LUMPECTOMY WITH SENTINEL LYMPH NODE BIOPSY;  Surgeon: Stark Klein, MD;  Location: [facility];  Service: General;  Laterality: Right;  . BREAST SURGERY     bx, no cancer   . CATARACT EXTRACTION Bilateral   . DILATATION & CURETTAGE/HYSTEROSCOPY WITH MYOSURE N/A 08/09/2017   Procedure: DILATATION & CURETTAGE/HYSTEROSCOPY WITH MYOSURE;  Surgeon: Emily Filbert, MD;  Location: [facility];  Service: Gynecology;  Laterality: N/A;  . LABIOPLASTY  03/06/2018   Procedure: LABIAPLASTY;  Surgeon: Emily Filbert, MD;  Location: [facility];  Service: Gynecology;;  . VAGINAL HYSTERECTOMY Bilateral 03/06/2018   Procedure: HYSTERECTOMY VAGINAL WITH BILATERAL SALPINGOOPHORECTOMY;  Surgeon: Emily Filbert, MD;   Location: [facility];  Service: Gynecology;  Laterality: Bilateral;   Social History:  Social History   Socioeconomic History  . Marital status: Married    Spouse name: Not on file  . Number of children: 4  . Years of education: Not on file  . Highest education level: Not on file  Occupational History  . Occupation: house   Tobacco Use  . Smoking status: Never Smoker  . Smokeless tobacco: Never Used  Substance and Sexual Activity  . Alcohol use: No  . Drug use: No  . Sexual activity: Not on file  Other Topics Concern  . Not on file  Social History Narrative   Original  from [place], [place]   Moved to the [place] 2000   Widow    She lives with son.  She had four sons.         Social Determinants of Health   Financial Resource Strain:   . Difficulty of Paying Living Expenses:   Food Insecurity:   . Worried About Charity fundraiser in the Last Year:   . Arboriculturist in the Last Year:   Transportation Needs:   . Film/video editor (Medical):   Marland Kitchen Lack of Transportation (Non-Medical):   Physical Activity:   . Days of Exercise per Week:   . Minutes of Exercise per Session:   Stress:   . Feeling of Stress :   Social Connections:   . Frequency of Communication with Friends and Family:   . Frequency of Social Gatherings with Friends and Family:   . Attends Religious Services:   . Active Member of Clubs or Organizations:   . Attends Archivist Meetings:   Marland Kitchen Marital Status:   Intimate Partner Violence:   . Fear of Current or Ex-Partner:   . Emotionally Abused:   Marland Kitchen Physically Abused:   . Sexually Abused:    Family History:  Family History  Problem Relation Age of Onset  . Diabetes Mother   . Neuropathy Mother   . Neuropathy Sister   . Cancer Sister        unknown type cancer in neck   . Other Father   . Cancer Maternal Aunt        leukemia   . Colon cancer Neg Hx   . Breast cancer Neg Hx     Review of Systems: Constitutional: Doesn't report fevers,  chills or abnormal weight loss Eyes: Doesn't report blurriness of vision Ears, nose, mouth, throat, and face: Doesn't report sore throat Respiratory: Doesn't report cough, dyspnea or wheezes Cardiovascular: Doesn't report palpitation, chest discomfort  Gastrointestinal:  Doesn't report nausea, constipation, diarrhea GU: Doesn't report incontinence Skin: Doesn't report skin rashes Neurological: Per HPI Musculoskeletal: Doesn't report joint pain Behavioral/Psych: Doesn't report anxiety  Physical Exam: Vitals:   03/05/20 1051  BP: 139/67  Pulse: 72  Resp: 17  Temp: 98.5 F (36.9 C)  SpO2: 99%   KPS: 80. General: Alert, cooperative, pleasant, in no acute distress Head: Normal EENT: No conjunctival injection or scleral icterus.  Lungs: Resp effort normal Cardiac: Regular rate Abdomen: Non-distended abdomen Skin: No rashes cyanosis or petechiae. Extremities: +lymphedema  Neurologic Exam: Mental Status: Awake, alert, attentive to examiner. Oriented to self and environment. Language is fluent with intact comprehension.  Cranial Nerves: Visual acuity is grossly normal. Visual fields are full. Extra-ocular movements intact. No ptosis. Face is symmetric Motor: Tone and bulk are normal. Power is full in both arms and legs. Reflexes are symmetric, no pathologic reflexes present.  Sensory: Stocking and glove sensory loss of temperature Gait: Normal.   Labs: I have reviewed the data as listed    Component Value Date/Time   NA 141 11/29/2019 1407   K 4.5 11/29/2019 1407   CL 104 11/29/2019 1407   CO2 29 11/29/2019 1407   GLUCOSE 75 11/29/2019 1407   BUN 22 11/29/2019 1407   CREATININE 0.97 11/29/2019 1407   CALCIUM 9.3 11/29/2019 1407   PROT 6.8 11/29/2019 1407   ALBUMIN 3.7 11/29/2019 1407   AST 27 11/29/2019 1407   ALT 20 11/29/2019 1407   ALKPHOS 110 11/29/2019 1407   BILITOT 0.2 (L) 11/29/2019 1407  GFRNONAA >60 11/29/2019 1407   GFRAA >60 11/29/2019 1407   Lab  Results  Component Value Date   WBC 4.8 11/29/2019   NEUTROABS 3.1 11/29/2019   HGB 10.4 (L) 11/29/2019   HCT 33.4 (L) 11/29/2019   MCV 94.4 11/29/2019   PLT 240 11/29/2019     Assessment/Plan 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    Due to failure of multiple neuropathic pain agents and concurrent antidepressant use, recommended initiating salvage therapy with Lyrica, starting at 70m BID.  We recommended also continuing Elavil at 570mHS  We appreciate the opportunity to participate in the care of [place] We will call her in 1 month or sooner as needed for ongoing or worsening symptoms.  All questions were answered. The patient knows to call the clinic with any problems, questions or concerns. No barriers to learning were detected.  The total time spent in the encounter was 30 minutes and more than 50% was on counseling and review of test results   ZaVentura SellersMD Medical Director of Neuro-Oncology [facility] WeNapoleon5/13/21 3:33 PM

## 2020-03-06 ENCOUNTER — Telehealth: Payer: Self-pay | Admitting: Internal Medicine

## 2020-03-06 NOTE — Telephone Encounter (Signed)
Scheduled appt per 5/13 los.  Left a vm of the appt date and time.

## 2020-04-02 ENCOUNTER — Inpatient Hospital Stay: Payer: Medicare Other | Attending: Family Medicine | Admitting: Internal Medicine

## 2020-04-02 ENCOUNTER — Telehealth: Payer: Self-pay | Admitting: Internal Medicine

## 2020-04-02 DIAGNOSIS — G609 Hereditary and idiopathic neuropathy, unspecified: Secondary | ICD-10-CM | POA: Diagnosis not present

## 2020-04-02 MED ORDER — AMITRIPTYLINE HCL 50 MG PO TABS: 50.0000 mg | ORAL_TABLET | Freq: Every day | ORAL | 3 refills | Status: DC

## 2020-04-02 NOTE — Telephone Encounter (Signed)
Scheduled appt per 6/10 los.  Spoke with pt son and he is aware of the appt date and time.

## 2020-04-02 NOTE — Progress Notes (Signed)
I connected with [place] on 04/02/20 at 11:30 AM EDT by telephone visit and verified that I am speaking with the correct person using two identifiers.  I discussed the limitations, risks, security and privacy concerns of performing an evaluation and management service by telemedicine and the availability of in-person appointments. I also discussed with the patient that there may be a patient responsible charge related to this service. The patient expressed understanding and agreed to proceed.  Other persons participating in the visit and their role in the encounter:  n/a  Patient's location:  Home  Provider's location:  Office  Chief Complaint:  Hereditary and idiopathic peripheral neuropathy  History of Present Ilness: No new or progressive deficits.  She did not tolerate the Lyrica well (abdominal discomfort) so she stopped it on her own.  Did not resume Gabapentin.  Symptom burden has actually improved somewhat on just 50mg  of Elavil each night in recent weeks.  Observations: Language and cognition stable Assessment and Plan: Hereditary and idiopathic peripheral neuropathy Ok to dc Lyrica and Gabapentin Can con't Elavil 50mg  HS, will refill Follow Up Instructions: f/u in 4 months  I discussed the assessment and treatment plan with the patient.  The patient was provided an opportunity to ask questions and all were answered.  The patient agreed with the plan and demonstrated understanding of the instructions.    The patient was advised to call back or seek an in-person evaluation if the symptoms worsen or if the condition fails to improve as anticipated.  I provided 5-10 minutes of non-face-to-face time during this enocunter.  Ventura Sellers, MD   I provided 15 minutes of non face-to-face telephone visit time during this encounter, and > 50% was spent counseling as documented under my assessment & plan.

## 2020-05-01 ENCOUNTER — Other Ambulatory Visit: Payer: Self-pay | Admitting: Hematology

## 2020-05-01 DIAGNOSIS — C50511 Malignant neoplasm of lower-outer quadrant of right female breast: Secondary | ICD-10-CM

## 2020-05-11 NOTE — Progress Notes (Signed)
Amy Roman   Telephone:([phone]) [phone] Fax:([phone]) [phone]   Clinic Follow up Note   Patient Care Team: Lin Landsman, MD as PCP - General (Family Medicine) Truitt Merle, MD as Consulting Physician (Hematology) Stark Klein, MD as Consulting Physician (General Surgery) Eppie Gibson, MD as Attending Physician (Radiation Oncology) Alla Feeling, NP as Nurse Practitioner (Nurse Practitioner)  Date of Service:  05/14/2020  CHIEF COMPLAINT: Follow up for right breast cancer  SUMMARY OF ONCOLOGIC HISTORY: Oncology History Overview Note  Cancer Staging Malignant neoplasm of 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, estrogen receptor positive ([place]) Staging form: Breast, AJCC 8th Edition - Clinical stage from 02/16/2018: Stage IB (cT2(m), cN0, cM0, G2, ER+, PR+, HER2-) - Signed by Truitt Merle, MD on 03/11/2018    Malignant neoplasm of 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, estrogen receptor positive ([place])  01/30/2018 Mammogram   Screening mammogram FINDINGS: In the right breast, possible distortion warrants further evaluation. The patient has had an excisional biopsy of her right breast upper outer quadrant in 2007, however this distortion is either better seen or new from her prior mammogram, and it does not quite match the surgical site as marked by a skin scar marker. In the left breast, no findings suspicious for malignancy.   02/14/2018 Mammogram   Diagnostic mammogram: In the lateral aspect of the right breast, posterior depth there is a prominent area of distortion at the approximate 9 o'clock location. While this is in the region of the excisional biopsy that the patient had in 2007, the distortion has become significantly more prominent as compared to the tomosynthesis mammogram performed in September of 2016, and appears to be inferior to the surgical scar.   02/14/2018 Breast US   Ultrasound of the right breast at 8:30, 4 cm from the nipple demonstrates an  irregular hypoechoic vascular mass with spiculated margins measuring 2.1 x 2.0 x 1.8 cm. There is a small adjacent mass at 9 o'clock, 4 cm from the nipple, which is 1.2 cm away from the dominant mass. The smaller mass measures 1.1 x 0.7 x 0.7 cm. Together, the 2 masses span 3.6 cm of tissue. Ultrasound of the right axilla demonstrates multiple normal-appearing lymph nodes.  IMPRESSION: 1. There is a highly suspicious mass in the right breast at 830 with a small possible satellite lesion at 9 o'clock. 2.  No evidence of right axillary lymphadenopathy.   02/16/2018 Cancer Staging   Staging form: Breast, AJCC 8th Edition - Clinical stage from 02/16/2018: Stage IB (cT2(m), cN0, cM0, G2, ER+, PR+, HER2-) - Signed by Truitt Merle, MD on 03/11/2018   02/16/2018 Initial Biopsy   Diagnosis 1. Breast, right, needle core biopsy, 8:30 o'clock - INVASIVE DUCTAL CARCINOMA, SEE COMMENT. 2. Breast, right, needle core biopsy, 9 o'clock - INVASIVE DUCTAL CARCINOMA. - LOBULAR NEOPLASIA (ATYPICAL LOBULAR HYPERPLASIA).  ER: 95%, positive, strong staining PR: 80%, positive, moderate staining  Proliferation Marker Ki67: 2%  HER2 - Negative Ratio of HER2/CEP17 signals: 1.80 Average HER2 copy number per cell: 2.97    03/01/2018 Imaging   MR Bilateral breast IMPRESSION: Two areas of known malignancy in the LATERAL portion of the RIGHT breast. No additional areas of concern in either breast. No axillary adenopathy.   03/11/2018 Initial Diagnosis   Malignant neoplasm of 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, estrogen receptor positive ([place])   04/05/2018 Surgery   RIGHT BREAST SEED BRACKETED LUMPECTOMY WITH SENTINEL LYMPH NODE BIOPSY by Dr. Barry Dienes 04/05/18   04/05/2018 Pathology  Results   Surgery  Diagnosis 04/05/18  1. Breast, lumpectomy, right - INVASIVE DUCTAL CARCINOMA GRADE I/III, TWO FOCI SPANNING 2.9 CM AND 0.9 CM. - LOBULAR NEOPLASIA (LOBULAR CARCINOMA IN SITU). - DUCTAL CARCINOMA IN SITU, LOW  GRADE. - LYMPHOVASCULAR INVASION IS IDENTIFIED. - THE SURGICAL RESECTION MARGINS ARE NEGATIVE FOR DUCTAL CARCINOMA. - SEE ONCOLOGY TABLE BELOW. 2. Lymph node, sentinel, biopsy, right - THERE IS NO EVIDENCE OF CARCINOMA IN 1 OF 1 LYMPH NODE (0/1). 3. Lymph node, sentinel, biopsy, right - METASTATIC CARCINOMA IN 1 OF 1 LYMPH NODE (1/1) WITH EXTRACAPSULAR EXTENSION. - EXTRACAPSULAR EXTENSION EXTENDS 0.3 CM. 4. Lymph node, sentinel, biopsy, right - DUCTAL CARCINOMA. - PERINEURAL INVASION IS IDENTIFIED. - SEE COMMENT. 5. Lymph node, sentinel, biopsy, right 1 of 4   04/05/2018 Miscellaneous   Mammaprint Low Risk with 10% risk of recurrance with Tamoxifen alone  MPI at +0.240 DMFI at 97.8%   04/12/2018 Cancer Staging   Staging form: Breast, AJCC 8th Edition - Pathologic: Stage IA (pT2, pN1a, cM0, G1, ER+, PR+, HER2-) - Signed by Truitt Merle, MD on 04/12/2018   04/13/2018 Imaging   Whole Body Bone scan 04/13/18 IMPRESSION: No scintigraphic evidence of osseous metastatic disease.   04/16/2018 Imaging   CT CAP W Contrast 04/16/18  IMPRESSION: 1. No definite findings of metastatic disease in the chest. Solitary solid 4 mm right lower lobe pulmonary nodule, for which initial chest CT follow-up is advised in 3 months. 2. Postsurgical changes from right breast lumpectomy and right axillary node dissection. Thin-walled fluid collections in the right breast and right axilla are most compatible with postsurgical seromas. 3. No evidence of metastatic disease in the abdomen or pelvis.    04/17/2018 Surgery   AXILLARY LYMPH NODE DISSECTION by Dr. Barry Dienes 04/17/18     04/17/2018 Pathology Results   Diagnosis 04/17/18  Lymph nodes, regional resection, Right Axillary - BENIGN FIBROADIPOSE AND BREAST TISSUE WITH FAT NECROSIS. - NO LYMPHOID TISSUE PRESENT. - NO MALIGNANCY IDENTIFIED. Microscopic Comment The entire specimen was submitted.   05/28/2018 - 07/10/2018 Radiation Therapy   Radiation  treatment dates:   05/28/2018-07/10/2018  Site/dose:    1. Right breast, 2 Gy in 25 fractions for a total dose of 50 Gy 2. Right SCV, 2 Gy in 25 fractions for a total dose of 50 Gy 3. Right breast boost, 2 Gy in 5 fractions for a total dose of 5 Gy           07/24/2018 -  Anti-estrogen oral therapy   Letrozole 2.'5mg'$  daily starting 07/24/18    Survivorship   Per Cira Rue, NP       CURRENT THERAPY:  Letrozole 2.'5mg'$  daily starting 07/2018   INTERVAL HISTORY:  Amy Roman is here for a follow up of right breast cancer. She was last seen be me 6 months ago. She presents to the clinic alone. She notes she is have been feeling off balanced due to arthritis and bursitis in her knees. She has not had PT for this. She also notes neuropathy in her LE and neck. She will take Gabapentin as needed to help her sleep. Her PCP is managing this. She notes she had severe pain now numbness in her hands.   She requested letter to say she needs help from her Sister who is overseas in [place].     REVIEW OF SYSTEMS:   Constitutional: Denies fevers, chills or abnormal weight loss Eyes: Denies blurriness of vision Ears, nose, mouth, throat, and face: Denies  mucositis or sore throat Respiratory: Denies cough, dyspnea or wheezes Cardiovascular: Denies palpitation, chest discomfort or lower extremity swelling Gastrointestinal:  Denies nausea, heartburn or change in bowel habits Skin: Denies abnormal skin rashes Lymphatics: Denies new lymphadenopathy or easy bruising Neurological: (+) Neuropathy in her LE, hands and neck, unstable balance Behavioral/Psych: Mood is stable, no new changes  All other systems were reviewed with the patient and are negative.  MEDICAL HISTORY:  Past Medical History:  Diagnosis Date  . Anemia   . Anxiety and depression 10/15/2014  . Arthritis    BILATERAL KNEES, HAD CORTISONE INJECTION 03/2017  . Brain cancer ([place])   . Breast cancer ([place]) 2019   Right Breast  Cancer  . Depression   . GERD (gastroesophageal reflux disease)    OCCASIONAL WITH CERTAIN FOOD  . Headache   . History of radiation therapy 05/28/18- 07/10/18   Right Breast 25 fractions for a total dose of 50 Gy, Right SCV and PAB 25 fractions for a total dose of 50 Gy, right breast boost, 5 fractions for a total dose of 10 Gy.   . Personal history of radiation therapy    Right Breast Cancer  . Seasonal allergies   . SVD (spontaneous vaginal delivery)    x 4  . Unspecified hereditary and idiopathic peripheral neuropathy 05/14/2014   LEFT KNEE AND FOOT    SURGICAL HISTORY: Past Surgical History:  Procedure Laterality Date  . AXILLARY LYMPH NODE DISSECTION Right 04/17/2018   Procedure: AXILLARY LYMPH NODE DISSECTION;  Surgeon: Stark Klein, MD;  Location: [facility];  Service: General;  Laterality: Right;  . BREAST EXCISIONAL BIOPSY Right 2007  . BREAST LUMPECTOMY Right 2019  . BREAST LUMPECTOMY WITH RADIOACTIVE SEED AND SENTINEL LYMPH NODE BIOPSY Right 04/05/2018   Procedure: RIGHT BREAST SEED BRACKETED LUMPECTOMY WITH SENTINEL LYMPH NODE BIOPSY;  Surgeon: Stark Klein, MD;  Location: [facility];  Service: General;  Laterality: Right;  . BREAST SURGERY     bx, no cancer   . CATARACT EXTRACTION Bilateral   . DILATATION & CURETTAGE/HYSTEROSCOPY WITH MYOSURE N/A 08/09/2017   Procedure: DILATATION & CURETTAGE/HYSTEROSCOPY WITH MYOSURE;  Surgeon: Emily Filbert, MD;  Location: [facility];  Service: Gynecology;  Laterality: N/A;  . LABIOPLASTY  03/06/2018   Procedure: LABIAPLASTY;  Surgeon: Emily Filbert, MD;  Location: [facility];  Service: Gynecology;;  . VAGINAL HYSTERECTOMY Bilateral 03/06/2018   Procedure: HYSTERECTOMY VAGINAL WITH BILATERAL SALPINGOOPHORECTOMY;  Surgeon: Emily Filbert, MD;  Location: [facility];  Service: Gynecology;  Laterality: Bilateral;    I have reviewed the social history and family history with the patient and they are unchanged from previous  note.  ALLERGIES:  has No Known Allergies.  MEDICATIONS:  Current Outpatient Medications  Medication Sig Dispense Refill  . amitriptyline (ELAVIL) 25 MG tablet Take by mouth.    Marland Kitchen b complex vitamins tablet Take 1 tablet by mouth daily.    . ferrous sulfate 325 (65 FE) MG EC tablet Take 325 mg by mouth 3 (three) times daily with meals.    Marland Kitchen FLUoxetine (PROZAC) 40 MG capsule Take 40 mg by mouth every morning.    . gabapentin (NEURONTIN) 300 MG capsule Take 1 capsule (300 mg total) by mouth 3 (three) times daily. 90 capsule 3  . hydrOXYzine (ATARAX/VISTARIL) 10 MG tablet Take 10 mg by mouth 3 (three) times daily.    Marland Kitchen letrozole (FEMARA) 2.5 MG tablet TAKE 1 TABLET BY MOUTH DAILY 90 tablet 2  .  omeprazole (PRILOSEC) 40 MG capsule TAKE 1 CAPSULE(40 MG) BY MOUTH TWICE DAILY FOR 10 DAYS 20 capsule 0  . traZODone (DESYREL) 100 MG tablet Take 100 mg by mouth at bedtime.     No current facility-administered medications for this visit.    PHYSICAL EXAMINATION: ECOG PERFORMANCE STATUS: 2 - Symptomatic, <50% confined to bed  Vitals:   05/14/20 1455  BP: (!) 128/62  Pulse: 84  Resp: 18  Temp: 97.9 F (36.6 C)  SpO2: 99%   Filed Weights   05/14/20 1455  Weight: (!) 202 lb 14.4 oz (92 kg)    GENERAL:alert, no distress and comfortable SKIN: skin color, texture, turgor are normal, no rashes or significant lesions EYES: normal, Conjunctiva are pink and non-injected, sclera clear  NECK: supple, thyroid normal size, non-tender, without nodularity LYMPH:  no palpable lymphadenopathy in the cervical, axillary  LUNGS: clear to auscultation and percussion with normal breathing effort HEART: regular rate & rhythm and no murmurs and no lower extremity edema ABDOMEN:abdomen soft, non-tender and normal bowel sounds Musculoskeletal:no cyanosis of digits and no clubbing  NEURO: alert & oriented x 3 with fluent speech, no focal motor/sensory deficits BREAST: S/p right lumpectomy: Surgical incision  healed well with scar tissue at incision and skin retraction (+) Mild right breast tenderness at incision (+) Lumpy breast tissue of left breast and axilla.  No palpable mass, nodules or adenopathy bilaterally. Breast exam benign.   LABORATORY DATA:  I have reviewed the data as listed CBC Latest Ref Rng & Units 05/14/2020 11/29/2019 11/15/2019  WBC 4.0 - 10.5 K/uL 3.7(L) 4.8 5.2  Hemoglobin 12.0 - 15.0 g/dL 12.2 10.4(L) 11.7(L)  Hematocrit 36 - 46 % 36.6 33.4(L) 36.0  Platelets 150 - 400 K/uL 236 240 230     CMP Latest Ref Rng & Units 11/29/2019 05/16/2019 08/03/2018  Glucose 70 - 99 mg/dL 75 90 116(H)  BUN 8 - 23 mg/dL 22 26(H) 16  Creatinine 0.44 - 1.00 mg/dL 0.97 0.91 0.89  Sodium 135 - 145 mmol/L 141 141 143  Potassium 3.5 - 5.1 mmol/L 4.5 4.5 4.2  Chloride 98 - 111 mmol/L 104 109 109  CO2 22 - 32 mmol/L _0 Calcium 8.9 - 10.3 mg/dL 9.3 10.2 10.0  Total Protein 6.5 - 8.1 g/dL 6.8 7.3 7.1  Total Bilirubin 0.3 - 1.2 mg/dL 0.2(L) 0.3 0.3  Alkaline Phos 38 - 126 U/L 110 127(H) 110  AST 15 - 41 U/L _1 ALT 0 - 44 U/L _2 RADIOGRAPHIC STUDIES: I have personally reviewed the radiological images as listed and agreed with the findings in the report. No results found.   ASSESSMENT & PLAN:  Amy Roman is a 69 y.o. 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 She is s/p right breast lumpectomy, R axillary LN dissection and adjuvant RT.  -She has clinical high risk disease, but her Mammoprint showed low risk luminal type A. Based on no benefit, adjuvant chemo was not recommended.  -Giving the strong ER and PR expression in her postmenopausal status, I started her on Letrozole starting 07/24/18. Tolerating well. Will continue for 5-10 years.  -From a breast cancer standpoint, she is clinically doing well. Lab reviewed,  her CBC WNL except WBC 3.7. Her physical exam and her 05/2019 mammogram were unremarkable. There is no clinical concern  for recurrence. -Continue surveillance. Next Mammogram in 05/2020.  -Continue Letrozole   -F/u in 6 months with NP Lacie                                            2. 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 She notes this is chronic.  -She has been see by Dr Mickeal Skinner who thinks her neuropathy is hereditary.  -Dr Mickeal Skinner switched her to from Gabapentin to Amitriptyline, but she notes this has not been enough so she has been using Gabapentin 365m twice at night only. If she would like refill, I will defer to Dr VMickeal Skinner  -Due to her Arthritis and Neuropathy she can have unstable balance. I strongly encouraged her to ambulate with walker to avoid fall.  -She previously declined knee replacement surgery for her arthritis.  -I strongly encouraged her to start PT to help her balance. She is open to this.   3. Anemia -She was anemic in 06/2018 with labs consistent with iron deficiency.  -She received IV Feraheme in 07/2018.  -GI workup (EGD and colonoscopy) with Dr. NSilverio Decampin 09/2018 showed non-bleeding internal hemorrhoids, H.pylori positive and sigmoid diverticulosis.   -Anemia has resolved recently.   4. Osteopenia -I previously discussed AI can effect her bone density. -Her baseline DEXA from 10/29/19 shows osteopenia with lowest T-score -2 at AP spine. Also low at hip with 1% risk of fracture in 10 years. Will repeat in 10/2021.  -Continue calcium and Vit Ditor while on medication.   5. Anxiety, depression -Patient does take anti-depressants  -Stable   7. H/o H. Pylori as seen on 09/25/18 Colonoscopy/endoscopy by Dr. NSilverio Decamp  8. Lung nodules -she was found to have a 472mright LL lung nodule on 04/16/18 CT scan  -Stable on 11/29/19 CT chest. Will continue to monitor.    Plan: -I will consult with Dr  VaMickeal Skinnerbout her Gabapentin.  -Mammogram in 05/2020  -Continue Letrozole  -Lab and F/u with NP Lacie in 6 months  -Send PT referral.  -Per request I provided letter to assist her sister[place]   No problem-specific Assessment & Plan notes found for this encounter.   Orders Placed This Encounter  Procedures  . MM DIAG BREAST TOMO BILATERAL    Standing Status:   Future    Standing Expiration Date:   05/14/2021    Order Specific Question:   Reason for Exam (SYMPTOM  OR DIAGNOSIS REQUIRED)    Answer:   screening    Order Specific Question:   [facility] imaging [facility]?    [facility] to Physical Therapy    Referral Priority:   Routine    Referral Type:   Physical Medicine    Referral Reason:   Specialty Services Required    Requested Specialty:   Physical Therapy    Number of Visits Requested:   1   All questions were answered. The patient knows to call the clinic with any problems, questions or concerns. No barriers to learning was detected. The total time spent in the appointment was 30 minutes.     YaTruitt MerleMD 05/14/2020   I, AmJoslyn Devonam acting as scribe for YaTruitt MerleMD.  I have reviewed the above documentation for accuracy and completeness, and I agree with the above.       

## 2020-05-14 ENCOUNTER — Other Ambulatory Visit: Payer: Self-pay | Admitting: Internal Medicine

## 2020-05-14 ENCOUNTER — Inpatient Hospital Stay: Payer: Medicare Other | Attending: Family Medicine | Admitting: Hematology

## 2020-05-14 ENCOUNTER — Inpatient Hospital Stay: Payer: Medicare Other

## 2020-05-14 ENCOUNTER — Other Ambulatory Visit: Payer: Self-pay

## 2020-05-14 ENCOUNTER — Encounter: Payer: Self-pay | Admitting: Hematology

## 2020-05-14 VITALS — BP 128/62 | HR 84 | Temp 97.9°F | Resp 18 | Ht 61.0 in | Wt 202.9 lb

## 2020-05-14 DIAGNOSIS — F329 Major depressive disorder, single episode, unspecified: Secondary | ICD-10-CM | POA: Insufficient documentation

## 2020-05-14 DIAGNOSIS — G609 Hereditary and idiopathic neuropathy, unspecified: Secondary | ICD-10-CM | POA: Diagnosis not present

## 2020-05-14 DIAGNOSIS — Z170 Estrogen receptor positive status [ER+]: Secondary | ICD-10-CM

## 2020-05-14 DIAGNOSIS — M8580 Other specified disorders of bone density and structure, unspecified site: Secondary | ICD-10-CM | POA: Insufficient documentation

## 2020-05-14 DIAGNOSIS — K219 Gastro-esophageal reflux disease without esophagitis: Secondary | ICD-10-CM | POA: Insufficient documentation

## 2020-05-14 DIAGNOSIS — Z90722 Acquired absence of ovaries, bilateral: Secondary | ICD-10-CM | POA: Diagnosis not present

## 2020-05-14 DIAGNOSIS — F419 Anxiety disorder, unspecified: Secondary | ICD-10-CM | POA: Insufficient documentation

## 2020-05-14 DIAGNOSIS — Z79899 Other long term (current) drug therapy: Secondary | ICD-10-CM | POA: Diagnosis not present

## 2020-05-14 DIAGNOSIS — D649 Anemia, unspecified: Secondary | ICD-10-CM | POA: Insufficient documentation

## 2020-05-14 DIAGNOSIS — Z923 Personal history of irradiation: Secondary | ICD-10-CM | POA: Insufficient documentation

## 2020-05-14 DIAGNOSIS — C50511 Malignant neoplasm of lower-outer quadrant of right female breast: Secondary | ICD-10-CM | POA: Diagnosis present

## 2020-05-14 DIAGNOSIS — Z79811 Long term (current) use of aromatase inhibitors: Secondary | ICD-10-CM | POA: Diagnosis not present

## 2020-05-14 DIAGNOSIS — D509 Iron deficiency anemia, unspecified: Secondary | ICD-10-CM

## 2020-05-14 DIAGNOSIS — F32A Depression, unspecified: Secondary | ICD-10-CM

## 2020-05-14 DIAGNOSIS — Z9079 Acquired absence of other genital organ(s): Secondary | ICD-10-CM | POA: Diagnosis not present

## 2020-05-14 DIAGNOSIS — Z90710 Acquired absence of both cervix and uterus: Secondary | ICD-10-CM | POA: Diagnosis not present

## 2020-05-14 DIAGNOSIS — G629 Polyneuropathy, unspecified: Secondary | ICD-10-CM | POA: Diagnosis not present

## 2020-05-14 LAB — CBC WITH DIFFERENTIAL (CANCER CENTER ONLY)
Abs Immature Granulocytes: 0.01 10*3/uL (ref 0.00–0.07)
Basophils Absolute: 0 10*3/uL (ref 0.0–0.1)
Basophils Relative: 1 %
Eosinophils Absolute: 0.2 10*3/uL (ref 0.0–0.5)
Eosinophils Relative: 4 %
HCT: 36.6 % (ref 36.0–46.0)
Hemoglobin: 12.2 g/dL (ref 12.0–15.0)
Immature Granulocytes: 0 %
Lymphocytes Relative: 30 %
Lymphs Abs: 1.1 10*3/uL (ref 0.7–4.0)
MCH: 31.5 pg (ref 26.0–34.0)
MCHC: 33.3 g/dL (ref 30.0–36.0)
MCV: 94.6 fL (ref 80.0–100.0)
Monocytes Absolute: 0.5 10*3/uL (ref 0.1–1.0)
Monocytes Relative: 13 %
Neutro Abs: 1.9 10*3/uL (ref 1.7–7.7)
Neutrophils Relative %: 52 %
Platelet Count: 236 10*3/uL (ref 150–400)
RBC: 3.87 MIL/uL (ref 3.87–5.11)
RDW: 13.5 % (ref 11.5–15.5)
WBC Count: 3.7 10*3/uL — ABNORMAL LOW (ref 4.0–10.5)
nRBC: 0 % (ref 0.0–0.2)

## 2020-05-14 MED ORDER — GABAPENTIN 300 MG PO CAPS: 300.0000 mg | ORAL_CAPSULE | Freq: Three times a day (TID) | ORAL | 3 refills | Status: DC

## 2020-05-15 ENCOUNTER — Telehealth: Payer: Self-pay | Admitting: Hematology

## 2020-05-15 LAB — IRON AND TIBC
Iron: 92 ug/dL (ref 41–142)
Saturation Ratios: 26 % (ref 21–57)
TIBC: 356 ug/dL (ref 236–444)
UIBC: 264 ug/dL (ref 120–384)

## 2020-05-15 LAB — FERRITIN: Ferritin: 64 ng/mL (ref 11–307)

## 2020-05-15 LAB — CANCER ANTIGEN 27.29: CA 27.29: 29 U/mL (ref 0.0–38.6)

## 2020-05-15 NOTE — Telephone Encounter (Signed)
Scheduled per 7/22 los. Mailing pt appt calendar.

## 2020-05-24 ENCOUNTER — Other Ambulatory Visit: Payer: Self-pay | Admitting: Internal Medicine

## 2020-06-02 ENCOUNTER — Ambulatory Visit (INDEPENDENT_AMBULATORY_CARE_PROVIDER_SITE_OTHER): Payer: Medicare Other

## 2020-06-02 ENCOUNTER — Ambulatory Visit (INDEPENDENT_AMBULATORY_CARE_PROVIDER_SITE_OTHER): Payer: Medicare Other | Admitting: Orthopaedic Surgery

## 2020-06-02 ENCOUNTER — Encounter: Payer: Self-pay | Admitting: Orthopaedic Surgery

## 2020-06-02 ENCOUNTER — Ambulatory Visit: Payer: Self-pay

## 2020-06-02 VITALS — Ht 60.5 in | Wt 203.2 lb

## 2020-06-02 DIAGNOSIS — M1711 Unilateral primary osteoarthritis, right knee: Secondary | ICD-10-CM

## 2020-06-02 DIAGNOSIS — M1712 Unilateral primary osteoarthritis, left knee: Secondary | ICD-10-CM

## 2020-06-02 NOTE — Progress Notes (Signed)
Office Visit Note   has failed: Amy Roman           Date of Birth: January 05, 1951           MRN: [id number] Visit Date: 06/02/2020              Requested by: Lin Landsman, [address] [place],  [place] [address] PCP: Lin Landsman, MD   Assessment & Plan: Visit Diagnoses:  1. Primary osteoarthritis of right knee   2. Primary osteoarthritis of left knee     Plan: Impression is severe tricompartmental degenerative joint disease worse on the right.  At this point patient has failed to receive significant pain relief from conservative treatments.  After consideration of her options she has elected to proceed with scheduling for right total knee replacement.  Risk benefits rehab recovery reviewed in detail.  She does have a significant soft tissue envelope which I have discussed I am concerned that she may get a superficial infection so I will plan on placing her on oral antibiotics postoperatively.  She denies a history of DVT or nickel allergy.  Follow-Up Instructions: Return if symptoms worsen or fail to improve.   Orders:  Orders Placed This Encounter  Procedures   XR KNEE 3 VIEW RIGHT   XR KNEE 3 VIEW LEFT   No orders of the defined types were placed in this encounter.     Procedures: No procedures performed   Clinical Data: No additional findings.   Subjective: Chief Complaint  Patient presents with   Left Knee - Pain   Right Knee - Pain    Patient is a pleasant 69 year old [place] female who comes in for continued bilateral knee pain worse on the right.  She states that she has had no relief from medications or cortisone injections.  She has neuropathy as well.  She has no quality of life and at this point is ready for discussion of a knee replacement.   Review of Systems  Constitutional: Negative.   HENT: Negative.   Eyes: Negative.   Respiratory: Negative.   Cardiovascular: Negative.   Endocrine: Negative.   Musculoskeletal:  Negative.   Neurological: Negative.   Hematological: Negative.   Psychiatric/Behavioral: Negative.   All other systems reviewed and are negative.    Objective: Vital Signs: Ht 5' 0.5" (1.537 m)    Wt 203 lb 3.2 oz (92.2 kg)    BMI 39.03 kg/m   Physical Exam Vitals and nursing note reviewed.  Constitutional:      Appearance: She is well-developed.  Pulmonary:     Effort: Pulmonary effort is normal.  Skin:    General: Skin is warm.     Capillary Refill: Capillary refill takes less than 2 seconds.  Neurological:     Mental Status: She is alert and oriented to person, place, and time.  Psychiatric:        Behavior: Behavior normal.        Thought Content: Thought content normal.        Judgment: Judgment normal.     Ortho Exam bilateral knees show larger soft tissue envelope.  Significant pain with range of motion.  Varus alignment. Specialty Comments:  No specialty comments available.  Imaging: No results found.   PMFS History: Patient Active Problem List   Diagnosis Date Noted   Iron deficiency anemia 07/23/2018   Breast cancer metastasized to axillary lymph node, right ([place]) 04/17/2018   Malignant neoplasm of lower-outer quadrant of right  breast of female, estrogen receptor positive ([place]) 03/11/2018   Post-operative state 03/06/2018   Uterine hyperplasia 10/11/2017   Anxiety and depression 10/15/2014   Vitamin B 12 deficiency 06/02/2014   Vitamin D deficiency 06/02/2014   Hereditary and idiopathic peripheral neuropathy 05/14/2014   Past Medical History:  Diagnosis Date   Anemia    Anxiety and depression 10/15/2014   Arthritis    BILATERAL KNEES, HAD CORTISONE INJECTION 03/2017   Brain cancer ([place])    Breast cancer ([place]) 2019   Right Breast Cancer   Depression    GERD (gastroesophageal reflux disease)    OCCASIONAL WITH CERTAIN FOOD   Headache    History of radiation therapy 05/28/18- 07/10/18   Right Breast 25 fractions for a total dose  of 50 Gy, Right SCV and PAB 25 fractions for a total dose of 50 Gy, right breast boost, 5 fractions for a total dose of 10 Gy.    Personal history of radiation therapy    Right Breast Cancer   Seasonal allergies    SVD (spontaneous vaginal delivery)    x 4   Unspecified hereditary and idiopathic peripheral neuropathy 05/14/2014   LEFT KNEE AND FOOT    Family History  Problem Relation Age of Onset   Diabetes Mother    Neuropathy Mother    Neuropathy Sister    Cancer Sister        unknown type cancer in neck    Other Father    Cancer Maternal Aunt        leukemia    Colon cancer Neg Hx    Breast cancer Neg Hx     Past Surgical History:  Procedure Laterality Date   AXILLARY LYMPH NODE DISSECTION Right 04/17/2018   Procedure: AXILLARY LYMPH NODE DISSECTION;  Surgeon: Stark Klein, MD;  Location: [facility];  Service: General;  Laterality: Right;   BREAST EXCISIONAL BIOPSY Right 2007   BREAST LUMPECTOMY Right 2019   BREAST LUMPECTOMY WITH RADIOACTIVE SEED AND SENTINEL LYMPH NODE BIOPSY Right 04/05/2018   Procedure: RIGHT BREAST SEED BRACKETED LUMPECTOMY WITH SENTINEL LYMPH NODE BIOPSY;  Surgeon: Stark Klein, MD;  Location: [facility];  Service: General;  Laterality: Right;   BREAST SURGERY     bx, no cancer    CATARACT EXTRACTION Bilateral    DILATATION & CURETTAGE/HYSTEROSCOPY WITH MYOSURE N/A 08/09/2017   Procedure: DILATATION & CURETTAGE/HYSTEROSCOPY WITH MYOSURE;  Surgeon: Emily Filbert, MD;  Location: [facility];  Service: Gynecology;  Laterality: N/A;   LABIOPLASTY  03/06/2018   Procedure: LABIAPLASTY;  Surgeon: Emily Filbert, MD;  Location: [facility];  Service: Gynecology;;   VAGINAL HYSTERECTOMY Bilateral 03/06/2018   Procedure: HYSTERECTOMY VAGINAL WITH BILATERAL SALPINGOOPHORECTOMY;  Surgeon: Emily Filbert, MD;  Location: [facility];  Service: Gynecology;  Laterality: Bilateral;   Social History   Occupational History   Occupation:  house   Tobacco Use   Smoking status: Never Smoker   Smokeless tobacco: Never Used  Scientific laboratory technician Use: Never used  Substance and Sexual Activity   Alcohol use: No   Drug use: No   Sexual activity: Not on file

## 2020-06-16 ENCOUNTER — Other Ambulatory Visit: Payer: Self-pay

## 2020-06-16 ENCOUNTER — Ambulatory Visit
Admission: RE | Admit: 2020-06-16 | Discharge: 2020-06-16 | Disposition: A | Discharge status: Home / Self Care | Payer: Medicare Other | Source: Ambulatory Visit | Attending: Hematology | Admitting: Hematology

## 2020-06-16 DIAGNOSIS — Z170 Estrogen receptor positive status [ER+]: Secondary | ICD-10-CM

## 2020-07-12 ENCOUNTER — Other Ambulatory Visit: Payer: Self-pay | Admitting: Internal Medicine

## 2020-08-04 ENCOUNTER — Inpatient Hospital Stay: Payer: Medicare Other | Admitting: Internal Medicine

## 2020-08-04 ENCOUNTER — Telehealth: Payer: Self-pay | Admitting: Internal Medicine

## 2020-08-04 NOTE — Telephone Encounter (Signed)
Called patient with interpreter to r/s 10/12 appt per sch msg. No answer from patient. Left a voicemail detailing that today was a phone visit and she does not need to come into the office. If she misses the call we will r/s and give her a call back.

## 2020-08-19 ENCOUNTER — Telehealth: Payer: Self-pay | Admitting: Internal Medicine

## 2020-08-19 NOTE — Telephone Encounter (Signed)
Rescheduled appt from 11/19 to 11/18. Provider unavailable on 11/19. Called pt's son and he requested to call [phone] to contact pt. Used language line interpreter to contact pt. Unable to reach pt, left voicemail with appt time and date.

## 2020-08-28 ENCOUNTER — Other Ambulatory Visit: Payer: Self-pay

## 2020-09-09 NOTE — Progress Notes (Signed)
Your procedure is scheduled on Monday, November 22nd.  Report to [facility] Main Entrance "A" at 10:35 A.M., and check in at the Admitting office.  Call this number if you have problems the morning of surgery:  [phone]  Call [phone] if you have any questions prior to your surgery date Monday-Friday 8am-4pm   Remember:  Do not eat after midnight the night before your surgery  You may drink clear liquids until 9:35 A.M. the morning of your surgery.   Clear liquids allowed are: Water, Non-Citrus Juices (without pulp), Carbonated Beverages, Clear Tea, Black Coffee Only, and Gatorade   Enhanced Recovery after Surgery for Orthopedics Enhanced Recovery after Surgery is a protocol used to improve the stress on your body and your recovery after surgery.  Patient Instructions  . The night before surgery:  o No food after midnight. ONLY clear liquids after midnight  .  Marland Kitchen The day of surgery (if you do NOT have diabetes):  o Drink ONE (1) Pre-Surgery Clear Ensure by 9:35 A.M. by the morning of surgery. This drink was given to you during your hospital  pre-op appointment visit. o Nothing else to drink after completing the  Pre-Surgery Clear Ensure.         If you have questions, please contact your surgeon's office.   Take these medicines the morning of surgery with A SIP OF WATER  FLUoxetine (PROZAC)  letrozole [facility])   If needed: hydrOXYzine (ATARAX/VISTARIL)  As of today, STOP taking any Aspirin (unless otherwise instructed by your surgeon) Aleve, Naproxen, Ibuprofen, Motrin, Advil, Goody's, BC's, all herbal medications, fish oil, and all vitamins.                     Do not wear jewelry, make up, or nail polish            Do not wear lotions, powders, perfumes/colognes, or deodorant.            Do not shave 48 hours prior to surgery.  Men may shave face and neck.            Do not bring valuables to the hospital.            [facility] is not responsible for any  belongings or valuables.  Do NOT Smoke (Tobacco/Vaping) or drink Alcohol 24 hours prior to your procedure If you use a CPAP at night, you may bring all equipment for your overnight stay.   Contacts, glasses, dentures or bridgework may not be worn into surgery.      For patients admitted to the hospital, discharge time will be determined by your treatment team.   Patients discharged the day of surgery will not be allowed to drive home, and someone needs to stay with them for 24 hours.  Special instructions:   [place]- Preparing For Surgery  Before surgery, you can play an important role. Because skin is not sterile, your skin needs to be as free of germs as possible. You can reduce the number of germs on your skin by washing with CHG (chlorahexidine gluconate) Soap before surgery.  CHG is an antiseptic cleaner which kills germs and bonds with the skin to continue killing germs even after washing.    Oral Hygiene is also important to reduce your risk of infection.  Remember - BRUSH YOUR TEETH THE MORNING OF SURGERY WITH YOUR REGULAR TOOTHPASTE  Please do not use if you have an allergy to CHG or antibacterial soaps. If your  skin becomes reddened/irritated stop using the CHG.  Do not shave (including legs and underarms) for at least 48 hours prior to first CHG shower. It is OK to shave your face.  Please follow these instructions carefully.   1. Shower the NIGHT BEFORE SURGERY and the MORNING OF SURGERY with CHG Soap.   2. If you chose to wash your hair, wash your hair first as usual with your normal shampoo.  3. After you shampoo, rinse your hair and body thoroughly to remove the shampoo.  4. Use CHG as you would any other liquid soap. You can apply CHG directly to the skin and wash gently with a scrungie or a clean washcloth.   5. Apply the CHG Soap to your body ONLY FROM THE NECK DOWN.  Do not use on open wounds or open sores. Avoid contact with your eyes, ears, mouth and genitals  (private parts). Wash Face and genitals (private parts)  with your normal soap.   6. Wash thoroughly, paying special attention to the area where your surgery will be performed.  7. Thoroughly rinse your body with warm water from the neck down.  8. DO NOT shower/wash with your normal soap after using and rinsing off the CHG Soap.  9. Pat yourself dry with a CLEAN TOWEL.  10. Wear CLEAN PAJAMAS to bed the night before surgery  11. Place CLEAN SHEETS on your bed the night of your first shower and DO NOT SLEEP WITH PETS.  Day of Surgery: Wear Clean/Comfortable clothing the morning of surgery Do not apply any deodorants/lotions.   Remember to brush your teeth WITH YOUR REGULAR TOOTHPASTE.   Please read over the following fact sheets that you were given.

## 2020-09-10 ENCOUNTER — Encounter (HOSPITAL_COMMUNITY): Payer: Self-pay

## 2020-09-10 ENCOUNTER — Other Ambulatory Visit (HOSPITAL_COMMUNITY)
Admission: RE | Admit: 2020-09-10 | Discharge: 2020-09-10 | Disposition: A | Discharge status: Home / Self Care | Payer: Medicare Other | Source: Ambulatory Visit | Attending: Orthopaedic Surgery | Admitting: Orthopaedic Surgery

## 2020-09-10 ENCOUNTER — Inpatient Hospital Stay: Payer: Medicare Other | Attending: Internal Medicine | Admitting: Internal Medicine

## 2020-09-10 ENCOUNTER — Ambulatory Visit (HOSPITAL_COMMUNITY)
Admission: RE | Admit: 2020-09-10 | Discharge: 2020-09-10 | Disposition: A | Discharge status: Home / Self Care | Payer: Medicare Other | Source: Ambulatory Visit | Attending: Physician Assistant | Admitting: Physician Assistant

## 2020-09-10 ENCOUNTER — Encounter (HOSPITAL_COMMUNITY)
Admission: RE | Admit: 2020-09-10 | Discharge: 2020-09-10 | Disposition: A | Discharge status: Home / Self Care | Payer: Medicare Other | Source: Ambulatory Visit | Attending: Orthopaedic Surgery | Admitting: Orthopaedic Surgery

## 2020-09-10 ENCOUNTER — Other Ambulatory Visit: Payer: Self-pay

## 2020-09-10 DIAGNOSIS — Z20822 Contact with and (suspected) exposure to covid-19: Secondary | ICD-10-CM | POA: Insufficient documentation

## 2020-09-10 DIAGNOSIS — M1711 Unilateral primary osteoarthritis, right knee: Secondary | ICD-10-CM

## 2020-09-10 DIAGNOSIS — Z01818 Encounter for other preprocedural examination: Secondary | ICD-10-CM | POA: Insufficient documentation

## 2020-09-10 HISTORY — DX: Cardiac murmur, unspecified: R01.1

## 2020-09-10 LAB — CBC WITH DIFFERENTIAL/PLATELET
Abs Immature Granulocytes: 0.01 10*3/uL (ref 0.00–0.07)
Basophils Absolute: 0 10*3/uL (ref 0.0–0.1)
Basophils Relative: 1 %
Eosinophils Absolute: 0.1 10*3/uL (ref 0.0–0.5)
Eosinophils Relative: 2 %
HCT: 40.9 % (ref 36.0–46.0)
Hemoglobin: 13.2 g/dL (ref 12.0–15.0)
Immature Granulocytes: 0 %
Lymphocytes Relative: 27 %
Lymphs Abs: 1.1 10*3/uL (ref 0.7–4.0)
MCH: 31.4 pg (ref 26.0–34.0)
MCHC: 32.3 g/dL (ref 30.0–36.0)
MCV: 97.1 fL (ref 80.0–100.0)
Monocytes Absolute: 0.5 10*3/uL (ref 0.1–1.0)
Monocytes Relative: 13 %
Neutro Abs: 2.3 10*3/uL (ref 1.7–7.7)
Neutrophils Relative %: 57 %
Platelets: 257 10*3/uL (ref 150–400)
RBC: 4.21 MIL/uL (ref 3.87–5.11)
RDW: 13.3 % (ref 11.5–15.5)
WBC: 4 10*3/uL (ref 4.0–10.5)
nRBC: 0 % (ref 0.0–0.2)

## 2020-09-10 LAB — COMPREHENSIVE METABOLIC PANEL
ALT: 18 U/L (ref 0–44)
AST: 26 U/L (ref 15–41)
Albumin: 3.9 g/dL (ref 3.5–5.0)
Alkaline Phosphatase: 102 U/L (ref 38–126)
Anion gap: 11 (ref 5–15)
BUN: 25 mg/dL — ABNORMAL HIGH (ref 8–23)
CO2: 26 mmol/L (ref 22–32)
Calcium: 10.1 mg/dL (ref 8.9–10.3)
Chloride: 102 mmol/L (ref 98–111)
Creatinine, Ser: 1.43 mg/dL — ABNORMAL HIGH (ref 0.44–1.00)
GFR, Estimated: 40 mL/min — ABNORMAL LOW (ref >60)
Glucose, Bld: 92 mg/dL (ref 70–99)
Potassium: 4.6 mmol/L (ref 3.5–5.1)
Sodium: 139 mmol/L (ref 135–145)
Total Bilirubin: 0.5 mg/dL (ref 0.3–1.2)
Total Protein: 7.1 g/dL (ref 6.5–8.1)

## 2020-09-10 LAB — SARS CORONAVIRUS 2 (TAT 6-24 HRS): SARS Coronavirus 2: NEGATIVE

## 2020-09-10 LAB — PROTIME-INR
INR: 1.1 (ref 0.8–1.2)
Prothrombin Time: 13.5 seconds (ref 11.4–15.2)

## 2020-09-10 LAB — URINALYSIS, ROUTINE W REFLEX MICROSCOPIC
Glucose, UA: NEGATIVE mg/dL
Hgb urine dipstick: NEGATIVE
Ketones, ur: 15 mg/dL — AB
Nitrite: NEGATIVE
Protein, ur: NEGATIVE mg/dL
Specific Gravity, Urine: >1.03 — ABNORMAL HIGH (ref 1.005–1.030)
pH: 5 (ref 5.0–8.0)

## 2020-09-10 LAB — APTT: aPTT: 32 seconds (ref 24–36)

## 2020-09-10 LAB — URINALYSIS, MICROSCOPIC (REFLEX): RBC / HPF: NONE SEEN RBC/hpf (ref 0–5)

## 2020-09-10 LAB — TYPE AND SCREEN
ABO/RH(D): O POS
Antibody Screen: NEGATIVE

## 2020-09-10 LAB — SURGICAL PCR SCREEN
MRSA, PCR: NEGATIVE
Staphylococcus aureus: NEGATIVE

## 2020-09-10 NOTE — Progress Notes (Signed)
PCP - Lin Landsman Cardiologist - denies  PPM/ICD - denies Device Orders -  Rep Notified -   Chest x-ray - 09/10/2020 EKG - 09/10/2020 Stress Test - denies ECHO - denies Cardiac Cath - denies  Sleep Study - denies    Patient instructed to hold all Aspirin, NSAID's, herbal medications, fish oil and vitamins 7 days prior to surgery.  ERAS Protcol -yes PRE-SURGERY Ensure or G2-ensure   COVID TEST- 09/10/2020 1445   Anesthesia review: no  Patient denies shortness of breath, fever, cough and chest pain at PAT appointment   All instructions explained to the patient, with a verbal understanding of the material. Patient agrees to go over the instructions while at home for a better understanding. Patient also instructed to self quarantine after being tested for COVID-19. The opportunity to ask questions was provided.

## 2020-09-10 NOTE — Progress Notes (Signed)
Contacted Dr. Erlinda Hong office about urinalysis result. Trace amount of leukocytes and many bacteria present.

## 2020-09-11 ENCOUNTER — Other Ambulatory Visit: Payer: Self-pay | Admitting: Physician Assistant

## 2020-09-11 ENCOUNTER — Ambulatory Visit: Payer: Medicare Other | Admitting: Internal Medicine

## 2020-09-11 ENCOUNTER — Telehealth: Payer: Self-pay

## 2020-09-11 MED ORDER — CIPROFLOXACIN HCL 500 MG PO TABS: 500.0000 mg | ORAL_TABLET | Freq: Two times a day (BID) | ORAL | 0 refills | Status: AC

## 2020-09-11 MED ORDER — TRANEXAMIC ACID 1000 MG/10ML IV SOLN
2000.0000 mg | INTRAVENOUS | Status: AC
  Administered 2020-09-14: 2000 mg via TOPICAL
  Filled 2020-09-11: qty 20

## 2020-09-11 MED ORDER — OXYCODONE-ACETAMINOPHEN 5-325 MG PO TABS
1.0000 | ORAL_TABLET | Freq: Four times a day (QID) | ORAL | 0 refills | Status: DC | PRN
Start: 2020-09-11 — End: 2020-09-29

## 2020-09-11 MED ORDER — ONDANSETRON HCL 4 MG PO TABS: 4.0000 mg | ORAL_TABLET | Freq: Three times a day (TID) | ORAL | 0 refills | Status: DC | PRN

## 2020-09-11 MED ORDER — ASPIRIN EC 81 MG PO TBEC: 81.0000 mg | DELAYED_RELEASE_TABLET | Freq: Two times a day (BID) | ORAL | 0 refills | Status: DC

## 2020-09-11 MED ORDER — CEFAZOLIN SODIUM-DEXTROSE 2-4 GM/100ML-% IV SOLN
2.0000 g | INTRAVENOUS | Status: DC
  Filled 2020-09-11: qty 100

## 2020-09-11 MED ORDER — METHOCARBAMOL 500 MG PO TABS: 500.0000 mg | ORAL_TABLET | Freq: Two times a day (BID) | ORAL | 0 refills | Status: DC | PRN

## 2020-09-11 MED ORDER — BUPIVACAINE LIPOSOME 1.3 % IJ SUSP
20.0000 mL | Freq: Once | INTRAMUSCULAR | Status: DC
  Filled 2020-09-11: qty 20

## 2020-09-11 MED ORDER — DOCUSATE SODIUM 100 MG PO CAPS: 100.0000 mg | ORAL_CAPSULE | Freq: Every day | ORAL | 2 refills | Status: AC | PRN

## 2020-09-11 NOTE — Progress Notes (Signed)
Xu, renal function not great Amy Roman, will you let patient know that she has a UT.  Will you also let her know that I have called in abx for this and she needs to go ahead and pick up so she can start them asap

## 2020-09-11 NOTE — Telephone Encounter (Signed)
Patients son answered the phone. He is aware and will pass message along. He is aware patient should start taking ABX ASAP.  Also advised him that other Meds were sent into pharm but should be taken after SU.

## 2020-09-11 NOTE — Telephone Encounter (Signed)
Precious Bard, Shell  09/11/2020 2:41 PM EST     Called patient no answer. LMOM to return our call.    Aundra Dubin, PA-C  09/11/2020 8:08 AM EST     Xu, renal function not great Marolyn Hammock, will you let patient know that she has a UT. Will you also let her know that I have called in abx for this and she needs to go ahead and pick up so she can start them asap

## 2020-09-13 DIAGNOSIS — M1711 Unilateral primary osteoarthritis, right knee: Secondary | ICD-10-CM

## 2020-09-14 ENCOUNTER — Ambulatory Visit (HOSPITAL_COMMUNITY): Payer: Medicare Other | Admitting: Certified Registered Nurse Anesthetist

## 2020-09-14 ENCOUNTER — Encounter (HOSPITAL_COMMUNITY): Payer: Self-pay | Admitting: Orthopaedic Surgery

## 2020-09-14 ENCOUNTER — Observation Stay (HOSPITAL_COMMUNITY)
Admission: RE | Admit: 2020-09-14 | Discharge: 2020-09-15 | Disposition: A | Discharge status: Home / Self Care | Payer: Medicare Other | Attending: Orthopaedic Surgery | Admitting: Orthopaedic Surgery

## 2020-09-14 ENCOUNTER — Observation Stay (HOSPITAL_COMMUNITY): Payer: Medicare Other

## 2020-09-14 ENCOUNTER — Encounter (HOSPITAL_COMMUNITY)
Admission: RE | Disposition: A | Discharge status: Home / Self Care | Payer: Self-pay | Source: Home / Self Care | Attending: Orthopaedic Surgery

## 2020-09-14 ENCOUNTER — Other Ambulatory Visit: Payer: Self-pay

## 2020-09-14 DIAGNOSIS — Z7982 Long term (current) use of aspirin: Secondary | ICD-10-CM | POA: Insufficient documentation

## 2020-09-14 DIAGNOSIS — Z85841 Personal history of malignant neoplasm of brain: Secondary | ICD-10-CM | POA: Diagnosis not present

## 2020-09-14 DIAGNOSIS — Z79899 Other long term (current) drug therapy: Secondary | ICD-10-CM | POA: Diagnosis not present

## 2020-09-14 DIAGNOSIS — M1711 Unilateral primary osteoarthritis, right knee: Secondary | ICD-10-CM | POA: Diagnosis not present

## 2020-09-14 DIAGNOSIS — M25561 Pain in right knee: Secondary | ICD-10-CM | POA: Diagnosis present

## 2020-09-14 DIAGNOSIS — Z96651 Presence of right artificial knee joint: Secondary | ICD-10-CM

## 2020-09-14 DIAGNOSIS — Z853 Personal history of malignant neoplasm of breast: Secondary | ICD-10-CM | POA: Insufficient documentation

## 2020-09-14 HISTORY — PX: TOTAL KNEE ARTHROPLASTY: SHX125

## 2020-09-14 SURGERY — ARTHROPLASTY, KNEE, TOTAL
Anesthesia: Regional | Site: Knee | Laterality: Right

## 2020-09-14 MED ORDER — ACETAMINOPHEN 500 MG PO TABS
1000.0000 mg | ORAL_TABLET | Freq: Four times a day (QID) | ORAL | Status: DC
  Administered 2020-09-14 – 2020-09-15 (×3): 1000 mg via ORAL
  Filled 2020-09-14 (×3): qty 2

## 2020-09-14 MED ORDER — VANCOMYCIN HCL 1000 MG IV SOLR
INTRAVENOUS | Status: AC
  Filled 2020-09-14: qty 1000

## 2020-09-14 MED ORDER — CHLORHEXIDINE GLUCONATE 0.12 % MT SOLN
OROMUCOSAL | Status: AC
  Administered 2020-09-14: 15 mL via OROMUCOSAL
  Filled 2020-09-14: qty 15

## 2020-09-14 MED ORDER — METHOCARBAMOL 500 MG PO TABS
500.0000 mg | ORAL_TABLET | Freq: Four times a day (QID) | ORAL | Status: DC | PRN
  Administered 2020-09-14: 500 mg via ORAL

## 2020-09-14 MED ORDER — ONDANSETRON HCL 4 MG/2ML IJ SOLN
INTRAMUSCULAR | Status: DC | PRN
  Administered 2020-09-14: 4 mg via INTRAVENOUS

## 2020-09-14 MED ORDER — ONDANSETRON HCL 4 MG/2ML IJ SOLN: 4.0000 mg | Freq: Four times a day (QID) | INTRAMUSCULAR | Status: DC | PRN

## 2020-09-14 MED ORDER — DEXAMETHASONE SODIUM PHOSPHATE 10 MG/ML IJ SOLN
10.0000 mg | Freq: Once | INTRAMUSCULAR | Status: AC
  Administered 2020-09-15: 10 mg via INTRAVENOUS
  Filled 2020-09-14: qty 1

## 2020-09-14 MED ORDER — MIDAZOLAM HCL 2 MG/2ML IJ SOLN
INTRAMUSCULAR | Status: AC
  Administered 2020-09-14: 2 mg via INTRAVENOUS
  Filled 2020-09-14: qty 2

## 2020-09-14 MED ORDER — DEXAMETHASONE SODIUM PHOSPHATE 10 MG/ML IJ SOLN
INTRAMUSCULAR | Status: AC
  Filled 2020-09-14: qty 2

## 2020-09-14 MED ORDER — DEXMEDETOMIDINE (PRECEDEX) IN NS 20 MCG/5ML (4 MCG/ML) IV SYRINGE
PREFILLED_SYRINGE | INTRAVENOUS | Status: AC
  Filled 2020-09-14: qty 5

## 2020-09-14 MED ORDER — METHOCARBAMOL 1000 MG/10ML IJ SOLN
500.0000 mg | Freq: Four times a day (QID) | INTRAVENOUS | Status: DC | PRN
  Filled 2020-09-14: qty 5

## 2020-09-14 MED ORDER — TRANEXAMIC ACID-NACL 1000-0.7 MG/100ML-% IV SOLN
1000.0000 mg | INTRAVENOUS | Status: AC
  Administered 2020-09-14: 1000 mg via INTRAVENOUS
  Filled 2020-09-14: qty 100

## 2020-09-14 MED ORDER — TRANEXAMIC ACID-NACL 1000-0.7 MG/100ML-% IV SOLN
1000.0000 mg | Freq: Once | INTRAVENOUS | Status: AC
  Administered 2020-09-14: 1000 mg via INTRAVENOUS
  Filled 2020-09-14: qty 100

## 2020-09-14 MED ORDER — OXYCODONE HCL 5 MG PO TABS
5.0000 mg | ORAL_TABLET | ORAL | Status: DC | PRN
  Filled 2020-09-14: qty 2

## 2020-09-14 MED ORDER — ACETAMINOPHEN 500 MG PO TABS
1000.0000 mg | ORAL_TABLET | Freq: Once | ORAL | Status: AC
  Administered 2020-09-14: 1000 mg via ORAL
  Filled 2020-09-14: qty 2

## 2020-09-14 MED ORDER — FENTANYL CITRATE (PF) 100 MCG/2ML IJ SOLN
INTRAMUSCULAR | Status: AC
  Filled 2020-09-14: qty 2

## 2020-09-14 MED ORDER — DEXMEDETOMIDINE (PRECEDEX) IN NS 20 MCG/5ML (4 MCG/ML) IV SYRINGE
PREFILLED_SYRINGE | INTRAVENOUS | Status: DC | PRN
  Administered 2020-09-14: 12 ug via INTRAVENOUS
  Administered 2020-09-14: 8 ug via INTRAVENOUS

## 2020-09-14 MED ORDER — CEFAZOLIN SODIUM-DEXTROSE 2-4 GM/100ML-% IV SOLN
2.0000 g | Freq: Four times a day (QID) | INTRAVENOUS | Status: AC
  Administered 2020-09-14 (×2): 2 g via INTRAVENOUS
  Filled 2020-09-14 (×2): qty 100

## 2020-09-14 MED ORDER — ACETAMINOPHEN 325 MG PO TABS: 325.0000 mg | ORAL_TABLET | Freq: Four times a day (QID) | ORAL | Status: DC | PRN

## 2020-09-14 MED ORDER — FENTANYL CITRATE (PF) 250 MCG/5ML IJ SOLN
INTRAMUSCULAR | Status: AC
  Filled 2020-09-14: qty 5

## 2020-09-14 MED ORDER — MENTHOL 3 MG MT LOZG: 1.0000 | LOZENGE | OROMUCOSAL | Status: DC | PRN

## 2020-09-14 MED ORDER — SORBITOL 70 % SOLN
30.0000 mL | Freq: Every day | Status: DC | PRN
  Filled 2020-09-14: qty 30

## 2020-09-14 MED ORDER — HYDROMORPHONE HCL 1 MG/ML IJ SOLN
0.5000 mg | INTRAMUSCULAR | Status: DC | PRN
  Administered 2020-09-14: 1 mg via INTRAVENOUS
  Filled 2020-09-14: qty 1

## 2020-09-14 MED ORDER — PROPOFOL 10 MG/ML IV BOLUS
INTRAVENOUS | Status: DC | PRN
  Administered 2020-09-14: 30 mg via INTRAVENOUS
  Administered 2020-09-14: 150 mg via INTRAVENOUS
  Administered 2020-09-14: 50 mg via INTRAVENOUS
  Administered 2020-09-14: 30 mg via INTRAVENOUS

## 2020-09-14 MED ORDER — DIPHENHYDRAMINE HCL 12.5 MG/5ML PO ELIX
25.0000 mg | ORAL_SOLUTION | ORAL | Status: DC | PRN
  Filled 2020-09-14: qty 10

## 2020-09-14 MED ORDER — METOCLOPRAMIDE HCL 5 MG/ML IJ SOLN: 5.0000 mg | Freq: Three times a day (TID) | INTRAMUSCULAR | Status: DC | PRN

## 2020-09-14 MED ORDER — METOCLOPRAMIDE HCL 5 MG PO TABS: 5.0000 mg | ORAL_TABLET | Freq: Three times a day (TID) | ORAL | Status: DC | PRN

## 2020-09-14 MED ORDER — PROPOFOL 500 MG/50ML IV EMUL
INTRAVENOUS | Status: DC | PRN
  Administered 2020-09-14: 25 ug/kg/min via INTRAVENOUS

## 2020-09-14 MED ORDER — CHLORHEXIDINE GLUCONATE 0.12 % MT SOLN: 15.0000 mL | Freq: Once | OROMUCOSAL | Status: AC

## 2020-09-14 MED ORDER — LACTATED RINGERS IV SOLN: INTRAVENOUS | Status: DC

## 2020-09-14 MED ORDER — METHOCARBAMOL 500 MG PO TABS
ORAL_TABLET | ORAL | Status: AC
  Filled 2020-09-14: qty 1

## 2020-09-14 MED ORDER — LETROZOLE 2.5 MG PO TABS
2.5000 mg | ORAL_TABLET | Freq: Every day | ORAL | Status: DC
  Administered 2020-09-14 – 2020-09-15 (×2): 2.5 mg via ORAL
  Filled 2020-09-14 (×2): qty 1

## 2020-09-14 MED ORDER — 0.9 % SODIUM CHLORIDE (POUR BTL) OPTIME
TOPICAL | Status: DC | PRN
  Administered 2020-09-14: 1000 mL

## 2020-09-14 MED ORDER — CEFAZOLIN SODIUM-DEXTROSE 2-3 GM-%(50ML) IV SOLR
INTRAVENOUS | Status: DC | PRN
  Administered 2020-09-14: 2 g via INTRAVENOUS

## 2020-09-14 MED ORDER — KETOROLAC TROMETHAMINE 15 MG/ML IJ SOLN
15.0000 mg | Freq: Four times a day (QID) | INTRAMUSCULAR | Status: DC
  Administered 2020-09-14 – 2020-09-15 (×3): 15 mg via INTRAVENOUS
  Filled 2020-09-14 (×3): qty 1

## 2020-09-14 MED ORDER — SODIUM CHLORIDE 0.9 % IV SOLN: INTRAVENOUS | Status: DC

## 2020-09-14 MED ORDER — ORAL CARE MOUTH RINSE: 15.0000 mL | Freq: Once | OROMUCOSAL | Status: AC

## 2020-09-14 MED ORDER — ONDANSETRON HCL 4 MG/2ML IJ SOLN: 4.0000 mg | Freq: Once | INTRAMUSCULAR | Status: DC | PRN

## 2020-09-14 MED ORDER — FLUOXETINE HCL 20 MG PO CAPS
40.0000 mg | ORAL_CAPSULE | Freq: Every morning | ORAL | Status: DC
  Administered 2020-09-15: 40 mg via ORAL
  Filled 2020-09-14: qty 2

## 2020-09-14 MED ORDER — ASPIRIN 81 MG PO CHEW
81.0000 mg | CHEWABLE_TABLET | Freq: Two times a day (BID) | ORAL | Status: DC
  Administered 2020-09-14 – 2020-09-15 (×2): 81 mg via ORAL
  Filled 2020-09-14 (×2): qty 1

## 2020-09-14 MED ORDER — MIDAZOLAM HCL 2 MG/2ML IJ SOLN: 2.0000 mg | Freq: Once | INTRAMUSCULAR | Status: AC

## 2020-09-14 MED ORDER — PHENOL 1.4 % MT LIQD: 1.0000 | OROMUCOSAL | Status: DC | PRN

## 2020-09-14 MED ORDER — BUPIVACAINE-EPINEPHRINE (PF) 0.25% -1:200000 IJ SOLN
INTRAMUSCULAR | Status: AC
  Filled 2020-09-14: qty 20

## 2020-09-14 MED ORDER — FENTANYL CITRATE (PF) 250 MCG/5ML IJ SOLN
INTRAMUSCULAR | Status: DC | PRN
Start: 2020-09-14 — End: 2020-09-14
  Administered 2020-09-14: 50 ug via INTRAVENOUS
  Administered 2020-09-14 (×3): 25 ug via INTRAVENOUS
  Administered 2020-09-14: 50 ug via INTRAVENOUS
  Administered 2020-09-14: 25 ug via INTRAVENOUS
  Administered 2020-09-14: 50 ug via INTRAVENOUS

## 2020-09-14 MED ORDER — MAGNESIUM CITRATE PO SOLN: 1.0000 | Freq: Once | ORAL | Status: DC | PRN

## 2020-09-14 MED ORDER — IRRISEPT - 450ML BOTTLE WITH 0.05% CHG IN STERILE WATER, USP 99.95% OPTIME
TOPICAL | Status: DC | PRN
  Administered 2020-09-14: 450 mL via TOPICAL

## 2020-09-14 MED ORDER — SODIUM CHLORIDE 0.9 % IR SOLN
Status: DC | PRN
  Administered 2020-09-14: 3000 mL

## 2020-09-14 MED ORDER — POLYETHYLENE GLYCOL 3350 17 G PO PACK: 17.0000 g | PACK | Freq: Every day | ORAL | Status: DC | PRN

## 2020-09-14 MED ORDER — POVIDONE-IODINE 10 % EX SWAB: 2.0000 "application " | Freq: Once | CUTANEOUS | Status: DC

## 2020-09-14 MED ORDER — DOCUSATE SODIUM 100 MG PO CAPS
100.0000 mg | ORAL_CAPSULE | Freq: Two times a day (BID) | ORAL | Status: DC
  Administered 2020-09-14 – 2020-09-15 (×2): 100 mg via ORAL
  Filled 2020-09-14 (×2): qty 1

## 2020-09-14 MED ORDER — FENTANYL CITRATE (PF) 100 MCG/2ML IJ SOLN
25.0000 ug | INTRAMUSCULAR | Status: DC | PRN
  Administered 2020-09-14 (×4): 25 ug via INTRAVENOUS

## 2020-09-14 MED ORDER — OXYCODONE HCL ER 10 MG PO T12A
10.0000 mg | EXTENDED_RELEASE_TABLET | Freq: Two times a day (BID) | ORAL | Status: DC
  Administered 2020-09-14 – 2020-09-15 (×2): 10 mg via ORAL
  Filled 2020-09-14 (×2): qty 1

## 2020-09-14 MED ORDER — HYDROXYZINE HCL 10 MG PO TABS
10.0000 mg | ORAL_TABLET | Freq: Three times a day (TID) | ORAL | Status: DC | PRN
  Filled 2020-09-14: qty 1

## 2020-09-14 MED ORDER — CIPROFLOXACIN HCL 500 MG PO TABS
500.0000 mg | ORAL_TABLET | Freq: Two times a day (BID) | ORAL | Status: DC
  Administered 2020-09-14 – 2020-09-15 (×2): 500 mg via ORAL
  Filled 2020-09-14 (×3): qty 1

## 2020-09-14 MED ORDER — TRAZODONE HCL 100 MG PO TABS
100.0000 mg | ORAL_TABLET | Freq: Every day | ORAL | Status: DC
  Administered 2020-09-14: 100 mg via ORAL
  Filled 2020-09-14 (×2): qty 1

## 2020-09-14 MED ORDER — ONDANSETRON HCL 4 MG PO TABS: 4.0000 mg | ORAL_TABLET | Freq: Four times a day (QID) | ORAL | Status: DC | PRN

## 2020-09-14 MED ORDER — BUPIVACAINE-EPINEPHRINE 0.25% -1:200000 IJ SOLN
INTRAMUSCULAR | Status: DC | PRN
  Administered 2020-09-14: 20 mL

## 2020-09-14 MED ORDER — DEXAMETHASONE SODIUM PHOSPHATE 10 MG/ML IJ SOLN
INTRAMUSCULAR | Status: DC | PRN
  Administered 2020-09-14: 10 mg via INTRAVENOUS

## 2020-09-14 MED ORDER — ROPIVACAINE HCL 5 MG/ML IJ SOLN
INTRAMUSCULAR | Status: DC | PRN
  Administered 2020-09-14: 30 mL via PERINEURAL

## 2020-09-14 MED ORDER — SODIUM CHLORIDE 0.9% FLUSH
INTRAVENOUS | Status: DC | PRN
  Administered 2020-09-14 (×2): 20 mL via INTRAVENOUS

## 2020-09-14 MED ORDER — BUPIVACAINE LIPOSOME 1.3 % IJ SUSP
INTRAMUSCULAR | Status: DC | PRN
  Administered 2020-09-14: 20 mL

## 2020-09-14 MED ORDER — VANCOMYCIN HCL 1000 MG IV SOLR
INTRAVENOUS | Status: DC | PRN
  Administered 2020-09-14: 1000 mg via TOPICAL

## 2020-09-14 MED ORDER — OXYCODONE HCL 5 MG PO TABS: 10.0000 mg | ORAL_TABLET | ORAL | Status: DC | PRN

## 2020-09-14 MED ORDER — ALUM & MAG HYDROXIDE-SIMETH 200-200-20 MG/5ML PO SUSP: 30.0000 mL | ORAL | Status: DC | PRN

## 2020-09-14 MED ORDER — ONDANSETRON HCL 4 MG/2ML IJ SOLN
INTRAMUSCULAR | Status: AC
  Filled 2020-09-14: qty 4

## 2020-09-14 SURGICAL SUPPLY — 87 items
ADH SKN CLS APL DERMABOND .7 (GAUZE/BANDAGES/DRESSINGS) ×1
ADH SKN CLS LQ APL DERMABOND (GAUZE/BANDAGES/DRESSINGS) ×1
ALCOHOL 70% 16 OZ (MISCELLANEOUS) ×3 IMPLANT
BAG DECANTER FOR FLEXI CONT (MISCELLANEOUS) ×3 IMPLANT
BANDAGE ESMARK 6X9 LF (GAUZE/BANDAGES/DRESSINGS) IMPLANT
BLADE SAG 18X100X1.27 (BLADE) ×3 IMPLANT
BNDG CMPR 9X6 STRL LF SNTH (GAUZE/BANDAGES/DRESSINGS)
BNDG ESMARK 6X9 LF (GAUZE/BANDAGES/DRESSINGS)
BOWL SMART MIX CTS (DISPOSABLE) ×3 IMPLANT
BSPLAT TIB 5D D CMNT STM RT (Knees) ×1 IMPLANT
CLOSURE STERI-STRIP 1/2X4 (GAUZE/BANDAGES/DRESSINGS) ×1
CLSR STERI-STRIP ANTIMIC 1/2X4 (GAUZE/BANDAGES/DRESSINGS) ×2 IMPLANT
COMP FEM CEMT PERS NARROW 6 RT (Joint) ×3 IMPLANT
COMPONENT FEM CMT PERS NRW 6RT (Joint) IMPLANT
COOLER ICEMAN CLASSIC (MISCELLANEOUS) ×3 IMPLANT
COVER SURGICAL LIGHT HANDLE (MISCELLANEOUS) ×3 IMPLANT
COVER WAND RF STERILE (DRAPES) IMPLANT
CUFF TOURN SGL QUICK 34 (TOURNIQUET CUFF)
CUFF TOURN SGL QUICK 42 (TOURNIQUET CUFF) ×2 IMPLANT
CUFF TRNQT CYL 34X4.125X (TOURNIQUET CUFF) IMPLANT
DERMABOND ADHESIVE PROPEN (GAUZE/BANDAGES/DRESSINGS) ×2
DERMABOND ADVANCED (GAUZE/BANDAGES/DRESSINGS) ×2
DERMABOND ADVANCED .7 DNX12 (GAUZE/BANDAGES/DRESSINGS) ×1 IMPLANT
DERMABOND ADVANCED .7 DNX6 (GAUZE/BANDAGES/DRESSINGS) ×1 IMPLANT
DRAPE EXTREMITY T 121X128X90 (DISPOSABLE) ×3 IMPLANT
DRAPE HALF SHEET 40X57 (DRAPES) ×3 IMPLANT
DRAPE INCISE IOBAN 66X45 STRL (DRAPES) IMPLANT
DRAPE ORTHO SPLIT 77X108 STRL (DRAPES) ×6
DRAPE POUCH INSTRU U-SHP 10X18 (DRAPES) ×3 IMPLANT
DRAPE SURG ORHT 6 SPLT 77X108 (DRAPES) ×2 IMPLANT
DRAPE U-SHAPE 47X51 STRL (DRAPES) ×6 IMPLANT
DRSG AQUACEL AG ADV 3.5X10 (GAUZE/BANDAGES/DRESSINGS) ×3 IMPLANT
DRSG AQUACEL AG ADV 3.5X14 (GAUZE/BANDAGES/DRESSINGS) ×3 IMPLANT
DURAPREP 26ML APPLICATOR (WOUND CARE) ×12 IMPLANT
ELECT CAUTERY BLADE 6.4 (BLADE) ×3 IMPLANT
ELECT REM PT RETURN 9FT ADLT (ELECTROSURGICAL) ×3
ELECTRODE REM PT RTRN 9FT ADLT (ELECTROSURGICAL) ×1 IMPLANT
GLOVE BIOGEL PI IND STRL 7.0 (GLOVE) ×1 IMPLANT
GLOVE BIOGEL PI INDICATOR 7.0 (GLOVE) ×2
GLOVE ECLIPSE 7.0 STRL STRAW (GLOVE) ×9 IMPLANT
GLOVE SKINSENSE NS SZ7.5 (GLOVE) ×6
GLOVE SKINSENSE STRL SZ7.5 (GLOVE) ×3 IMPLANT
GLOVE SURG SYN 7.5  E (GLOVE) ×12
GLOVE SURG SYN 7.5 E (GLOVE) ×4 IMPLANT
GLOVE SURG SYN 7.5 PF PI (GLOVE) ×4 IMPLANT
GOWN STRL REIN XL XLG (GOWN DISPOSABLE) ×3 IMPLANT
GOWN STRL REUS W/ TWL LRG LVL3 (GOWN DISPOSABLE) ×1 IMPLANT
GOWN STRL REUS W/TWL LRG LVL3 (GOWN DISPOSABLE) ×3
HANDPIECE INTERPULSE COAX TIP (DISPOSABLE) ×3
HDLS TROCR DRIL PIN KNEE 75 (PIN) ×6
HOOD PEEL AWAY FLYTE STAYCOOL (MISCELLANEOUS) ×6 IMPLANT
INSERT TIB AS PERS CD3-9 18 RT (Insert) ×2 IMPLANT
JET LAVAGE IRRISEPT WOUND (IRRIGATION / IRRIGATOR) ×3
KIT BASIN OR (CUSTOM PROCEDURE TRAY) ×3 IMPLANT
KIT TURNOVER KIT B (KITS) ×3 IMPLANT
LAVAGE JET IRRISEPT WOUND (IRRIGATION / IRRIGATOR) ×1 IMPLANT
MANIFOLD NEPTUNE II (INSTRUMENTS) ×3 IMPLANT
MARKER SKIN DUAL TIP RULER LAB (MISCELLANEOUS) ×3 IMPLANT
NDL SPNL 18GX3.5 QUINCKE PK (NEEDLE) ×2 IMPLANT
NEEDLE SPNL 18GX3.5 QUINCKE PK (NEEDLE) ×6 IMPLANT
NS IRRIG 1000ML POUR BTL (IV SOLUTION) ×3 IMPLANT
PACK TOTAL JOINT (CUSTOM PROCEDURE TRAY) ×3 IMPLANT
PAD ARMBOARD 7.5X6 YLW CONV (MISCELLANEOUS) ×6 IMPLANT
PAD COLD SHLDR WRAP-ON (PAD) ×3 IMPLANT
PIN DRILL HDLS TROCAR 75 4PK (PIN) ×2 IMPLANT
SAW OSC TIP CART 19.5X105X1.3 (SAW) ×3 IMPLANT
SET HNDPC FAN SPRY TIP SCT (DISPOSABLE) ×1 IMPLANT
STAPLER VISISTAT 35W (STAPLE) IMPLANT
STEM POLY PAT PLY 29M KNEE (Knees) ×2 IMPLANT
STEM TIB ST PERS 14+30 (Stem) ×3 IMPLANT
STEM TIBIA 5 DEG SZ D R KNEE (Knees) IMPLANT
SUCTION FRAZIER HANDLE 10FR (MISCELLANEOUS) ×3
SUCTION TUBE FRAZIER 10FR DISP (MISCELLANEOUS) ×1 IMPLANT
SUT ETHILON 2 0 FS 18 (SUTURE) IMPLANT
SUT MNCRL AB 4-0 PS2 18 (SUTURE) IMPLANT
SUT VIC AB 0 CT1 27 (SUTURE) ×6
SUT VIC AB 0 CT1 27XBRD ANBCTR (SUTURE) ×2 IMPLANT
SUT VIC AB 1 CTX 27 (SUTURE) ×9 IMPLANT
SUT VIC AB 2-0 CT1 27 (SUTURE) ×12
SUT VIC AB 2-0 CT1 TAPERPNT 27 (SUTURE) ×4 IMPLANT
SYR 50ML LL SCALE MARK (SYRINGE) ×6 IMPLANT
TIBIA STEM 5 DEG SZ D R KNEE (Knees) ×3 IMPLANT
TOWEL GREEN STERILE (TOWEL DISPOSABLE) ×3 IMPLANT
TOWEL GREEN STERILE FF (TOWEL DISPOSABLE) ×3 IMPLANT
TRAY CATH 16FR W/PLASTIC CATH (SET/KITS/TRAYS/PACK) IMPLANT
UNDERPAD 30X36 HEAVY ABSORB (UNDERPADS AND DIAPERS) ×3 IMPLANT
WRAP KNEE MAXI GEL POST OP (GAUZE/BANDAGES/DRESSINGS) ×3 IMPLANT

## 2020-09-14 NOTE — Anesthesia Procedure Notes (Signed)
Procedure Name: LMA Insertion Date/Time: 09/14/2020 11:40 AM Performed by: Candis Shine, CRNA Pre-anesthesia Checklist: Patient identified, Emergency Drugs available, Suction available and Patient being monitored Patient Re-evaluated:Patient Re-evaluated prior to induction Oxygen Delivery Method: Circle System Utilized Preoxygenation: Pre-oxygenation with 100% oxygen Induction Type: IV induction LMA: LMA inserted LMA Size: 4.0 Number of attempts: 1 Placement Confirmation: positive ETCO2 Tube secured with: Tape Dental Injury: Teeth and Oropharynx as per pre-operative assessment

## 2020-09-14 NOTE — H&P (Signed)
PREOPERATIVE H&P  Chief Complaint: right knee degenerative joint disease  HPI: Amy Roman is a 69 y.o. female who presents for surgical treatment of right knee degenerative joint disease.  She denies any changes in medical history.  Past Medical History:  Diagnosis Date   Anemia    Anxiety and depression 10/15/2014   Arthritis    BILATERAL KNEES, HAD CORTISONE INJECTION 03/2017   Brain cancer ([place])    Breast cancer ([place]) 2019   Right Breast Cancer   Depression    GERD (gastroesophageal reflux disease)    OCCASIONAL WITH CERTAIN FOOD   Headache    Heart murmur    History of radiation therapy 05/28/18- 07/10/18   Right Breast 25 fractions for a total dose of 50 Gy, Right SCV and PAB 25 fractions for a total dose of 50 Gy, right breast boost, 5 fractions for a total dose of 10 Gy.    Personal history of radiation therapy    Right Breast Cancer   Seasonal allergies    SVD (spontaneous vaginal delivery)    x 4   Unspecified hereditary and idiopathic peripheral neuropathy 05/14/2014   LEFT KNEE AND FOOT   Past Surgical History:  Procedure Laterality Date   ABDOMINAL HYSTERECTOMY     AXILLARY LYMPH NODE DISSECTION Right 04/17/2018   Procedure: AXILLARY LYMPH NODE DISSECTION;  Surgeon: Stark Klein, MD;  Location: [facility];  Service: General;  Laterality: Right;   BREAST EXCISIONAL BIOPSY Right 2007   BREAST LUMPECTOMY Right 2019   BREAST LUMPECTOMY WITH RADIOACTIVE SEED AND SENTINEL LYMPH NODE BIOPSY Right 04/05/2018   Procedure: RIGHT BREAST SEED BRACKETED LUMPECTOMY WITH SENTINEL LYMPH NODE BIOPSY;  Surgeon: Stark Klein, MD;  Location: [facility];  Service: General;  Laterality: Right;   BREAST SURGERY     bx, no cancer    CATARACT EXTRACTION Bilateral    DILATATION & CURETTAGE/HYSTEROSCOPY WITH MYOSURE N/A 08/09/2017   Procedure: [place];  Surgeon: Emily Filbert, MD;   Location: [facility];  Service: Gynecology;  Laterality: N/A;   LABIOPLASTY  03/06/2018   Procedure: LABIAPLASTY;  Surgeon: Emily Filbert, MD;  Location: [facility];  Service: Gynecology;;   VAGINAL HYSTERECTOMY Bilateral 03/06/2018   Procedure: HYSTERECTOMY VAGINAL WITH BILATERAL SALPINGOOPHORECTOMY;  Surgeon: Emily Filbert, MD;  Location: [facility];  Service: Gynecology;  Laterality: Bilateral;   Social History   Socioeconomic History   Marital status: Widowed    Spouse name: Not on file   Number of children: 4   Years of education: Not on file   Highest education level: Not on file  Occupational History   Occupation: house   Tobacco Use   Smoking status: Never Smoker   Smokeless tobacco: Never Used  Vaping Use   Vaping Use: Never used  Substance and Sexual Activity   Alcohol use: No   Drug use: No   Sexual activity: Not on file  Other Topics Concern   Not on file  Social History Narrative   Original from [place]   Moved to the [place] 2000   Widow    She lives with son.  She had four sons.         Social Determinants of Health   Financial Resource Strain:    Difficulty of Paying Living Expenses: Not on file  Food Insecurity:    Worried About [place] in the Last Year: Not on file   Ran  Out of Food in the Last Year: Not on file  Transportation Needs:    Lack of Transportation (Medical): Not on file   Lack of Transportation (Non-Medical): Not on file  Physical Activity:    Days of Exercise per Week: Not on file   Minutes of Exercise per Session: Not on file  Stress:    Feeling of Stress : Not on file  Social Connections:    Frequency of Communication with Friends and Family: Not on file   Frequency of Social Gatherings with Friends and Family: Not on file   Attends Religious Services: Not on file   Active Member of Clubs or Organizations: Not on file   Attends Archivist Meetings: Not on file   Marital Status: Not on file    Family History  Problem Relation Age of Onset   Diabetes Mother    Neuropathy Mother    Neuropathy Sister    Cancer Sister        unknown type cancer in neck    Other Father    Cancer Maternal Aunt        leukemia    Colon cancer Neg Hx    Breast cancer Neg Hx    No Known Allergies Prior to Admission medications   Medication Sig Start Date End Date Taking? Authorizing Provider  b complex vitamins tablet Take 1 tablet by mouth daily.   Yes [provider]  cholecalciferol (VITAMIN D3) 25 MCG (1000 UNIT) tablet Take 1,000 Units by mouth daily.   Yes [provider]  FLUoxetine (PROZAC) 40 MG capsule Take 40 mg by mouth every morning. 05/04/20  Yes [provider]  hydrOXYzine (ATARAX/VISTARIL) 10 MG tablet Take 10 mg by mouth 3 (three) times daily as needed for anxiety.  04/04/20  Yes [provider]  letrozole ([place]) 2.5 MG tablet TAKE 1 TABLET BY MOUTH DAILY Patient taking differently: Take 2.5 mg by mouth daily.  05/01/20  Yes Truitt Merle, MD  traZODone (DESYREL) 100 MG tablet Take 100 mg by mouth at bedtime.   Yes [provider]  vitamin B-12 (CYANOCOBALAMIN) 500 MCG tablet Take 500 mcg by mouth daily.   Yes [provider]  amitriptyline (ELAVIL) 25 MG tablet TAKE 2 TABLETS(50 MG) BY MOUTH AT BEDTIME. MAY INCREASE TO 75 MG AT BEDTIME AFTER 2 WEEKS Patient not taking: Reported on 09/09/2020 07/13/20   Ventura Sellers, MD  aspirin EC 81 MG tablet Take 1 tablet (81 mg total) by mouth 2 (two) times daily. To be taken after surgery to prevent blood clots 09/11/20   Aundra Dubin, PA-C  ciprofloxacin (CIPRO) 500 MG tablet Take 1 tablet (500 mg total) by mouth 2 (two) times daily for 10 days. 09/11/20 09/21/20  Aundra Dubin, PA-C  docusate sodium (COLACE) 100 MG capsule Take 1 capsule (100 mg total) by mouth daily as needed. 09/11/20 09/11/21  Aundra Dubin, PA-C  ferrous sulfate 325 (65 FE) MG EC tablet Take 325 mg by  mouth 3 (three) times daily with meals. Patient not taking: Reported on 09/09/2020    [provider]  gabapentin (NEURONTIN) 300 MG capsule Take 1 capsule (300 mg total) by mouth 3 (three) times daily. Patient not taking: Reported on 09/09/2020 05/14/20   Ventura Sellers, MD  methocarbamol (ROBAXIN) 500 MG tablet Take 1 tablet (500 mg total) by mouth 2 (two) times daily as needed. To be taken after surgery as needed 09/11/20   Aundra Dubin, PA-C  omeprazole (PRILOSEC) 40 MG capsule TAKE 1 CAPSULE(40 MG) BY MOUTH TWICE DAILY FOR 10 DAYS Patient not taking: Reported on 09/09/2020 12/02/19   Mauri Pole, MD  ondansetron (ZOFRAN) 4 MG tablet Take 1 tablet (4 mg total) by mouth every 8 (eight) hours as needed for nausea or vomiting. 09/11/20   Aundra Dubin, PA-C  oxyCODONE-acetaminophen (PERCOCET) 5-325 MG tablet Take 1-2 tablets by mouth every 6 (six) hours as needed. To be taken after surgery as needed 09/11/20   Aundra Dubin, PA-C     Positive ROS: All other systems have been reviewed and were otherwise negative with the exception of those mentioned in the HPI and as above.  Physical Exam: General: Alert, no acute distress Cardiovascular: No pedal edema Respiratory: No cyanosis, no use of accessory musculature GI: abdomen soft Skin: No lesions in the area of chief complaint Neurologic: Sensation intact distally Psychiatric: Patient is competent for consent with normal mood and affect Lymphatic: no lymphedema  MUSCULOSKELETAL: exam stable  Assessment: right knee degenerative joint disease  Plan: Plan for Procedure(s): RIGHT TOTAL KNEE ARTHROPLASTY  The risks benefits and alternatives were discussed with the patient including but not limited to the risks of nonoperative treatment, versus surgical intervention including infection, bleeding, nerve injury,  blood clots, cardiopulmonary complications, morbidity, mortality, among others, and they were willing to  proceed.   Preoperative templating of the joint replacement has been completed, documented, and submitted to the Operating Room personnel in order to optimize intra-operative equipment management.   Eduard Roux, MD 09/14/2020 6:19 AM

## 2020-09-14 NOTE — Evaluation (Signed)
Physical Therapy Evaluation Patient Details Name: Amy Roman MRN: [id number] DOB: June 21, 1951 Today's Date: 09/14/2020   History of Present Illness  Pt is a 69 y/o female s/p R TKA. PMH includes breast cancer, and brain cancer.   Clinical Impression  Pt is s/p surgery above with deficits below. Pt reporting increased pain, so mobility limited to chair. Required min A to stand and transfer to chair using RW. Educated about importance of mobility. Reports sons will be staying with her at d/c. Will continue to follow acutely.     Follow Up Recommendations Follow surgeon's recommendation for DC plan and follow-up therapies;Supervision for mobility/OOB    Equipment Recommendations  None recommended by PT    Recommendations for Other Services OT consult     Precautions / Restrictions Precautions Precautions: Knee Precaution Booklet Issued: No Precaution Comments: Reviewed knee precautions with pt and pt's son.  Restrictions Weight Bearing Restrictions: Yes RLE Weight Bearing: Weight bearing as tolerated      Mobility  Bed Mobility Overal bed mobility: Needs Assistance Bed Mobility: Supine to Sit     Supine to sit: Mod assist     General bed mobility comments: Mod A for LE assist. Increased time required.     Transfers Overall transfer level: Needs assistance Equipment used: Rolling walker (2 wheeled) Transfers: Sit to/from [facility] Sit to Stand: Min assist;+2 safety/equipment Stand pivot transfers: Min assist       General transfer comment: Min A +2 (son assisting per pt request) to stand. Cues for hand placement. Min A for steadying to transfer to chair. Cues for sequencing.   Ambulation/Gait                Stairs            Wheelchair Mobility    Modified Rankin (Stroke Patients Only)       Balance Overall balance assessment: Needs assistance Sitting-balance support: No upper extremity supported;Feet  supported Sitting balance-Leahy Scale: Fair     Standing balance support: Bilateral upper extremity supported;During functional activity Standing balance-Leahy Scale: Poor Standing balance comment: Reliant on BUE support                              Pertinent Vitals/Pain Pain Assessment: 0-10 Pain Score: 9  Pain Location: R knee  Pain Descriptors / Indicators: Aching Pain Intervention(s): Limited activity within patient's tolerance;Monitored during session;Repositioned    Home Living Family/patient expects to be discharged to:: Private residence Living Arrangements: Children Available Help at Discharge: Family;Available 24 hours/day Type of Home: House Home Access: Stairs to enter Entrance Stairs-Rails: Left Entrance Stairs-Number of Steps: 4 Home Layout: One level Home Equipment: Walker - 2 wheels;Bedside commode      Prior Function Level of Independence: Independent with assistive device(s)         Comments: Pt reports occasionally using RW      Hand Dominance        Extremity/Trunk Assessment   Upper Extremity Assessment Upper Extremity Assessment: Defer to OT evaluation    Lower Extremity Assessment Lower Extremity Assessment: LLE deficits/detail;RLE deficits/detail RLE Deficits / Details: Deficits consistent with post op pain and weakness. RLE structure looks like lymphedema? Pt reports this is basleine.  LLE Deficits / Details: Structure looks like lymphedema? Pt reports this is baseline.        Communication   Communication: Prefers language other than English;Interpreter utilized (Son present and preferred to answer; interpreter  present too)  Cognition Arousal/Alertness: Awake/alert Behavior During Therapy: WFL for tasks assessed/performed Overall Cognitive Status: Within Functional Limits for tasks assessed                                        General Comments      Exercises     Assessment/Plan    PT  Assessment Patient needs continued PT services  PT Problem List Decreased strength;Decreased range of motion;Decreased activity tolerance;Decreased balance;Decreased mobility;Decreased knowledge of use of DME;Decreased knowledge of precautions;Pain       PT Treatment Interventions DME instruction;Gait training;Functional mobility training;Stair training;Therapeutic activities;Therapeutic exercise;Balance training;Patient/family education    PT Goals (Current goals can be found in the Care Plan section)  Acute Rehab PT Goals Patient Stated Goal: to decrease pain  PT Goal Formulation: With patient Time For Goal Achievement: 09/28/20 Potential to Achieve Goals: Good    Frequency 7X/week   Barriers to discharge        Co-evaluation               AM-PAC PT "6 Clicks" Mobility  Outcome Measure Help needed turning from your back to your side while in a flat bed without using bedrails?: A Little Help needed moving from lying on your back to sitting on the side of a flat bed without using bedrails?: A Lot Help needed moving to and from a bed to a chair (including a wheelchair)?: A Little Help needed standing up from a chair using your arms (e.g., wheelchair or bedside chair)?: A Little Help needed to walk in hospital room?: A Lot Help needed climbing 3-5 steps with a railing? : Total 6 Click Score: 14    End of Session Equipment Utilized During Treatment: Gait belt Activity Tolerance: Patient limited by pain Patient left: in chair;with call bell/phone within reach;with family/visitor present Nurse Communication: Mobility status PT Visit Diagnosis: Unsteadiness on feet (R26.81);Muscle weakness (generalized) (M62.81);Other abnormalities of gait and mobility (R26.89);Pain Pain - Right/Left: Right Pain - part of body: Knee    Time: 8016-5537 PT Time Calculation (min) (ACUTE ONLY): 25 min   Charges:   PT Evaluation $PT Eval Low Complexity: 1 Low PT Treatments $Therapeutic  Activity: 8-22 mins        Amy Roman, DPT  Acute Rehabilitation Services  Pager: [phone] Office: [phone]   Amy Roman 09/14/2020, 4:59 PM

## 2020-09-14 NOTE — Transfer of Care (Signed)
Immediate Anesthesia Transfer of Care Note  Patient: Amy Roman  Procedure(s) Performed: RIGHT TOTAL KNEE ARTHROPLASTY (Right Knee)  Patient Location: PACU  Anesthesia Type:General and Regional  Level of Consciousness: drowsy and patient cooperative  Airway & Oxygen Therapy: Patient Spontanous Breathing and Patient connected to face mask oxygen  Post-op Assessment: Report given to RN, Post -op Vital signs reviewed and stable and Patient moving all extremities X 4  Post vital signs: Reviewed and stable  Last Vitals:  Vitals Value Taken Time  BP 146/84 09/14/20 1352  Temp    Pulse 65 09/14/20 1356  Resp 11 09/14/20 1356  SpO2 82 % 09/14/20 1356  Vitals shown include unvalidated device data.  Last Pain:  Vitals:   09/14/20 1040  TempSrc:   PainSc: 4       Patients Stated Pain Goal: 4 (25/36/64 4034)  Complications: No complications documented.

## 2020-09-14 NOTE — Anesthesia Procedure Notes (Signed)
Anesthesia Regional Block: Adductor canal block   Pre-Anesthetic Checklist: ,, timeout performed, Correct Patient, Correct Site, Correct Laterality, Correct Procedure,, site marked, risks and benefits discussed, Surgical consent,  Pre-op evaluation,  At surgeon's request and post-op pain management  Laterality: Right  Prep: chloraprep       Needles:  Injection technique: Single-shot  Needle Type: Echogenic Stimulator Needle     Needle Length: 10cm  Needle Gauge: 20     Additional Needles:   Procedures:,,,, ultrasound used (permanent image in chart),,,,  Narrative:  Start time: 09/14/2020 11:10 AM End time: 09/14/2020 11:20 AM Injection made incrementally with aspirations every 5 mL.  Performed by: Personally  Anesthesiologist: Murvin Natal, MD  Additional Notes: Functioning IV was confirmed and monitors were applied. A time-out was performed. Hand hygiene and sterile gloves were used. The thigh was placed in a frog-leg position and prepped in a sterile fashion. A 183mm 20ga BBraun echogenic stimulator needle was placed using ultrasound guidance.  Negative aspiration and negative test dose prior to incremental administration of local anesthetic. The patient tolerated the procedure well.

## 2020-09-14 NOTE — Anesthesia Postprocedure Evaluation (Signed)
Anesthesia Post Note  Patient: Amy Roman  Procedure(s) Performed: RIGHT TOTAL KNEE ARTHROPLASTY (Right Knee)     Patient location during evaluation: PACU Anesthesia Type: Regional and General Level of consciousness: awake and alert Pain management: pain level controlled Vital Signs Assessment: post-procedure vital signs reviewed and stable Respiratory status: spontaneous breathing, nonlabored ventilation, respiratory function stable and patient connected to nasal cannula oxygen Cardiovascular status: blood pressure returned to baseline and stable Postop Assessment: no apparent nausea or vomiting Anesthetic complications: no   No complications documented.  Last Vitals:  Vitals:   09/14/20 1556 09/14/20 1952  BP: 114/61 135/76  Pulse: 70 76  Resp: 16 18  Temp: (!) 36.4 C 36.6 C  SpO2: 99% 96%    Last Pain:  Vitals:   09/14/20 1952  TempSrc: Oral  PainSc:                  Karyl Kinnier Hydia Copelin

## 2020-09-14 NOTE — Progress Notes (Signed)
Orthopedic Tech Progress Note Patient Details:  CORINNA BURKMAN 03-11-1951 [id number]  CPM Right Knee CPM Right Knee: On Right Knee Flexion (Degrees): 50 Right Knee Extension (Degrees): 0  Post Interventions Patient Tolerated: Fair, Well Instructions Provided: Care of device  Janit Pagan 09/14/2020, 2:57 PM

## 2020-09-14 NOTE — Op Note (Addendum)
Total Knee Arthroplasty Procedure Note  Preoperative diagnosis: Right knee osteoarthritis  Postoperative diagnosis:same  Operative procedure: Right total knee arthroplasty. CPT [phone]  Surgeon: N. Eduard Roux, MD  Assist: Madalyn Rob, PA-C; necessary for the timely completion of procedure and due to complexity of procedure.  Anesthesia: general LMA, regional, local  Tourniquet time: see anesthesia record  Implants used: Zimmer persona Femur: CR 6 Tibia: D Patella: 29 mm Polyethylene: 18 mm, MC  Indication: Amy Roman is a 69 y.o. year old female with a history of knee pain. Having failed conservative management, the patient elected to proceed with a total knee arthroplasty.  We have reviewed the risk and benefits of the surgery and they elected to proceed after voicing understanding.  Procedure:  After informed consent was obtained and understanding of the risk were voiced including but not limited to bleeding, infection, damage to surrounding structures including nerves and vessels, blood clots, leg length inequality and the failure to achieve desired results, the operative extremity was marked with verbal confirmation of the patient in the holding area.   The patient was then brought to the operating room and transported to the operating room table in the supine position.  A tourniquet was applied to the operative extremity around the upper thigh. The operative limb was then prepped and draped in the usual sterile fashion and preoperative antibiotics were administered.  A time out was performed prior to the start of surgery confirming the correct extremity, preoperative antibiotic administration, as well as team members, implants and instruments available for the case. Correct surgical site was also confirmed with preoperative radiographs. The limb was then elevated for exsanguination and the tourniquet was inflated. A midline incision was made and a standard  medial parapatellar approach was performed.  The infrapatellar fat pad was removed.  Suprapatellar synovium was removed to reveal the anterior distal femoral cortex.  A medial peel was performed to release the capsule of the medial tibial plateau.  The patella was then everted and was prepared and sized to a 29 mm.  A cover was placed on the patella for protection from retractors.  The knee was then brought into full flexion and we then turned our attention to the femur.  The cruciates were sacrificed.  Start site was drilled in the femur and the intramedullary distal femoral cutting guide was placed, set at 5 degrees valgus, taking 10 mm of distal resection. The distal cut was made. Osteophytes were then removed.  Next, the proximal tibial cutting guide was placed with appropriate slope, varus/valgus alignment and depth of resection. The proximal tibial cut was made. Gap blocks were then used to assess the extension gap and alignment, and appropriate soft tissue releases were performed. Attention was turned back to the femur, which was sized using the sizing guide to a size 6. Appropriate rotation of the femoral component was determined using epicondylar axis, Whiteside's line, and assessing the flexion gap under ligament tension. The appropriate size 4-in-1 cutting block was placed and checked with an angel wing and cuts were made. Posterior femoral osteophytes and uncapped bone were then removed with the curved osteotome.  Trial components were placed, and stability was checked in full extension, mid-flexion, and deep flexion. Proper tibial rotation was determined and marked.  The patella tracked well without a lateral release.  The femoral lugs were then drilled. Trial components were then removed and tibial preparation performed.  The tibia was sized for a size D component.  A small  stubby stem was used to augment tibial fixation.  A posterior capsular injection comprising of 20 cc of 1.3% exparel, 20 cc of  0.25% bupivicaine with epi and 20 cc of normal saline was performed for postoperative pain control. The bony surfaces were irrigated with a pulse lavage and then dried. Bone cement was vacuum mixed on the back table, and the final components sized above were cemented into place.  Antibiotic irrigation was placed in the knee joint and soft tissues while the cement cured.  After cement had finished curing, excess cement was removed. The stability of the construct was re-evaluated throughout a range of motion and found to be acceptable. The trial liner was removed, the knee was copiously irrigated, and the knee was re-evaluated for any excess bone debris. The real polyethylene liner, 18 mm thick, was inserted and checked to ensure the locking mechanism had engaged appropriately. The tourniquet was deflated and hemostasis was achieved. The wound was irrigated with normal saline.  One gram of vancomycin powder was placed in the surgical bed.  Capsular closure was performed with a #1 vicryl, subcutaneous fat closed with a 0 vicryl suture, then subcutaneous tissue closed with interrupted 2.0 vicryl suture. The skin was then closed with a 2.0 nylon and dermabond. A sterile dressing was applied.  The patient was awakened in the operating room and taken to recovery in stable condition. All sponge, needle, and instrument counts were correct at the end of the case.  Position: supine  Complications: none.  Time Out: performed   Drains/Packing: none  Estimated blood loss: minimal  Returned to Recovery Room: in good condition.   Antibiotics: yes   Mechanical VTE (DVT) Prophylaxis: sequential compression devices, TED thigh-high  Chemical VTE (DVT) Prophylaxis: aspirin  Fluid Replacement  Crystalloid: see anesthesia record Blood: none  FFP: none   Specimens Removed: 1 to pathology   Sponge and Instrument Count Correct? yes   PACU: portable radiograph - knee AP and Lateral   Plan/RTC: Return in 2 weeks  for wound check.   Weight Bearing/Load Lower Extremity: full   N. Eduard Roux, MD [facility] 1:08 PM

## 2020-09-14 NOTE — Anesthesia Preprocedure Evaluation (Addendum)
Anesthesia Evaluation  Patient identified by MRN, date of birth, ID band Patient awake    Reviewed: Allergy & Precautions, NPO status , Patient's Chart, lab work & pertinent test results  Airway Mallampati: III  TM Distance: >3 FB Neck ROM: Full    Dental  (+) Edentulous Upper, Missing   Pulmonary neg pulmonary ROS,    Pulmonary exam normal breath sounds clear to auscultation       Cardiovascular negative cardio ROS Normal cardiovascular exam Rhythm:Regular Rate:Normal  ECG: NSR, rate 79   Neuro/Psych  Headaches, PSYCHIATRIC DISORDERS Anxiety Depression Unspecified hereditary and idiopathic peripheral neuropathy  Neuromuscular disease    GI/Hepatic Neg liver ROS, GERD  Medicated and Controlled,  Endo/Other  negative endocrine ROS  Renal/GU negative Renal ROS     Musculoskeletal  (+) Arthritis ,   Abdominal (+) + obese,   Peds  Hematology negative hematology ROS (+)   Anesthesia Other Findings  right knee degenerative joint disease  Reproductive/Obstetrics                            Anesthesia Physical Anesthesia Plan  ASA: II  Anesthesia Plan: Spinal and Regional   Post-op Pain Management:  Regional for Post-op pain   Induction: Intravenous  PONV Risk Score and Plan: 2 and Propofol infusion, Ondansetron, Dexamethasone, Treatment may vary due to age or medical condition and Midazolam  Airway Management Planned: Simple Face Mask  Additional Equipment:   Intra-op Plan:   Post-operative Plan:   Informed Consent: I have reviewed the patients History and Physical, chart, labs and discussed the procedure including the risks, benefits and alternatives for the proposed anesthesia with the patient or authorized representative who has indicated his/her understanding and acceptance.     Dental advisory given and Interpreter used for interveiw  Plan Discussed with: CRNA  Anesthesia  Plan Comments:         Anesthesia Quick Evaluation

## 2020-09-15 ENCOUNTER — Encounter (HOSPITAL_COMMUNITY): Payer: Self-pay | Admitting: Orthopaedic Surgery

## 2020-09-15 DIAGNOSIS — M1711 Unilateral primary osteoarthritis, right knee: Secondary | ICD-10-CM | POA: Diagnosis not present

## 2020-09-15 LAB — BASIC METABOLIC PANEL
Anion gap: 10 (ref 5–15)
BUN: 17 mg/dL (ref 8–23)
CO2: 25 mmol/L (ref 22–32)
Calcium: 9.3 mg/dL (ref 8.9–10.3)
Chloride: 104 mmol/L (ref 98–111)
Creatinine, Ser: 1.03 mg/dL — ABNORMAL HIGH (ref 0.44–1.00)
GFR, Estimated: 59 mL/min — ABNORMAL LOW (ref >60)
Glucose, Bld: 117 mg/dL — ABNORMAL HIGH (ref 70–99)
Potassium: 5.6 mmol/L — ABNORMAL HIGH (ref 3.5–5.1)
Sodium: 139 mmol/L (ref 135–145)

## 2020-09-15 LAB — CBC
HCT: 32.9 % — ABNORMAL LOW (ref 36.0–46.0)
Hemoglobin: 10.5 g/dL — ABNORMAL LOW (ref 12.0–15.0)
MCH: 30.7 pg (ref 26.0–34.0)
MCHC: 31.9 g/dL (ref 30.0–36.0)
MCV: 96.2 fL (ref 80.0–100.0)
Platelets: 211 10*3/uL (ref 150–400)
RBC: 3.42 MIL/uL — ABNORMAL LOW (ref 3.87–5.11)
RDW: 13.2 % (ref 11.5–15.5)
WBC: 10.7 10*3/uL — ABNORMAL HIGH (ref 4.0–10.5)
nRBC: 0 % (ref 0.0–0.2)

## 2020-09-15 MED ORDER — CEPHALEXIN 500 MG PO CAPS: 500.0000 mg | ORAL_CAPSULE | Freq: Four times a day (QID) | ORAL | 0 refills | Status: DC

## 2020-09-15 NOTE — Progress Notes (Signed)
Physical Therapy Treatment Patient Details Name: Amy Roman MRN: [id number] DOB: January 06, 1951 Today's Date: 09/15/2020    History of Present Illness Pt is a 69 y/o female s/p R TKA on 09/14/20. PMH includes breast cancer (s/p radiation), brain cancer.   PT Comments    Pt progressing well with mobility. Today's session focused on additional transfer/gait training with RW and LE strengthening. Pt preparing for d/c home today; son present and supportive. Reviewed educ and answered questions. Pt will have necessary assist from family. Recommend follow-up with HHPT services.    Follow Up Recommendations  Home health PT;Supervision for mobility/OOB;Follow surgeon's recommendation for DC plan and follow-up therapies     Equipment Recommendations   (tub bench)    Recommendations for Other Services       Precautions / Restrictions Precautions Precautions: Knee Restrictions Weight Bearing Restrictions: Yes RLE Weight Bearing: Weight bearing as tolerated    Mobility  Bed Mobility Overal bed mobility: Needs Assistance Bed Mobility: Supine to Sit;Sit to Supine     Supine to sit: Min assist Sit to supine: Min assist   General bed mobility comments: MinA for LE management  Transfers Overall transfer level: Needs assistance Equipment used: Rolling walker (2 wheeled) Transfers: Sit to/from Stand Sit to Stand: Min guard         General transfer comment: Increased time and effort preparing to stand, pt recognizes so is fearful/nervous regarding anticipation of pain, min guard for balance; son present to observe transfer training  Ambulation/Gait Ambulation/Gait assistance: Min guard;Supervision Gait Distance (Feet): 200 Feet Assistive device: Rolling walker (2 wheeled) Gait Pattern/deviations: Step-through pattern;Decreased stride length;Antalgic;Trunk flexed Gait velocity: Decreased   General Gait Details: Slow, antalgic gait with RW and intermittent min guard for  balance; stability and speed improving, still taking intermittent rest breaks to "stretch" sore BUEs   Stairs             Wheelchair Mobility    Modified Rankin (Stroke Patients Only)       Balance Overall balance assessment: Needs assistance Sitting-balance support: No upper extremity supported;Feet supported Sitting balance-Leahy Scale: Fair     Standing balance support: Single extremity supported;Bilateral upper extremity supported;During functional activity Standing balance-Leahy Scale: Fair Standing balance comment: Able to stand without UE support                            Cognition Arousal/Alertness: Awake/alert Behavior During Therapy: WFL for tasks assessed/performed Overall Cognitive Status: Within Functional Limits for tasks assessed                                 General Comments: WFL for simple tasks; likely decreased attention as pt stops walking/performing mobility ask to talk with difficulty multi-tasking      Exercises Other Exercises Other Exercises: BUE shoulder flex/abd, elbow flex/ext, wrist flex/ext    General Comments General comments (skin integrity, edema, etc.): Son present and supportive; educ pt and son on activity recommendations. Noted bilateral ted hose gathered around ankles, stretched/readjusted to prevent restricted blood flow      Pertinent Vitals/Pain Pain Assessment: Faces Faces Pain Scale: Hurts a little bit Pain Location: BUEs (soreness) > R knee Pain Descriptors / Indicators: Tiring;Sore Pain Intervention(s): Monitored during session    Home Living                      Prior  Function            PT Goals (current goals can now be found in the care plan section) Progress towards PT goals: Progressing toward goals    Frequency    7X/week      PT Plan      Co-evaluation              AM-PAC PT "6 Clicks" Mobility   Outcome Measure  Help needed turning from your  back to your side while in a flat bed without using bedrails?: A Little Help needed moving from lying on your back to sitting on the side of a flat bed without using bedrails?: A Little Help needed moving to and from a bed to a chair (including a wheelchair)?: A Little Help needed standing up from a chair using your arms (e.g., wheelchair or bedside chair)?: A Little Help needed to walk in hospital room?: A Little Help needed climbing 3-5 steps with a railing? : A Little 6 Click Score: 18    End of Session   Activity Tolerance: Patient tolerated treatment well Patient left: in bed;with call bell/phone within reach;with bed alarm set;with family/visitor present Nurse Communication: Mobility status PT Visit Diagnosis: Unsteadiness on feet (R26.81);Muscle weakness (generalized) (M62.81);Other abnormalities of gait and mobility (R26.89);Pain Pain - Right/Left: Right Pain - part of body: Knee     Time: 2233-6122 PT Time Calculation (min) (ACUTE ONLY): 18 min  Charges:  $Therapeutic Exercise: 8-22 mins                    Mabeline Caras, PT, DPT Acute Rehabilitation Services  Pager [phone] Office [place] 09/15/2020, 1:18 PM

## 2020-09-15 NOTE — Discharge Summary (Signed)
Patient ID: Amy Roman MRN: [id number] DOB/AGE: 1950-12-03 69 y.o.  Admit date: 09/14/2020 Discharge date: 09/15/2020  Admission Diagnoses:  Principal Problem:   Primary osteoarthritis of right knee Active Problems:   Status post total knee replacement, right   Discharge Diagnoses:  Same  Past Medical History:  Diagnosis Date  . Anemia   . Anxiety and depression 10/15/2014  . Arthritis    BILATERAL KNEES, HAD CORTISONE INJECTION 03/2017  . Brain cancer ([place])   . Breast cancer ([place]) 2019   Right Breast Cancer  . Depression   . GERD (gastroesophageal reflux disease)    OCCASIONAL WITH CERTAIN FOOD  . Headache   . Heart murmur   . History of radiation therapy 05/28/18- 07/10/18   Right Breast 25 fractions for a total dose of 50 Gy, Right SCV and PAB 25 fractions for a total dose of 50 Gy, right breast boost, 5 fractions for a total dose of 10 Gy.   . Personal history of radiation therapy    Right Breast Cancer  . Seasonal allergies   . SVD (spontaneous vaginal delivery)    x 4  . Unspecified hereditary and idiopathic peripheral neuropathy 05/14/2014   LEFT KNEE AND FOOT    Surgeries: Procedure(s): RIGHT TOTAL KNEE ARTHROPLASTY on 09/14/2020   Consultants:   Discharged Condition: Improved  Hospital Course: SHENIYA GARCIAPEREZ is an 69 y.o. female who was admitted 09/14/2020 for operative treatment ofPrimary osteoarthritis of right knee. Patient has severe unremitting pain that affects sleep, daily activities, and work/hobbies. After pre-op clearance the patient was taken to the operating room on 09/14/2020 and underwent  Procedure(s): RIGHT TOTAL KNEE ARTHROPLASTY.    Patient was given perioperative antibiotics:  Anti-infectives (From admission, onward)   Start     Dose/Rate Route Frequency Ordered Stop   09/14/20 2200  ciprofloxacin (CIPRO) tablet 500 mg        500 mg Oral 2 times daily 09/14/20 1602 09/21/20 2159   09/14/20 1730  ceFAZolin (ANCEF) IVPB  2g/100 mL premix        2 g 200 mL/hr over 30 Minutes Intravenous Every 6 hours 09/14/20 1603 09/14/20 2345   09/14/20 1217  vancomycin (VANCOCIN) powder  Status:  Discontinued          As needed 09/14/20 1218 09/14/20 1347   09/14/20 1215  ceFAZolin (ANCEF) IVPB 2g/100 mL premix  Status:  Discontinued        2 g 200 mL/hr over 30 Minutes Intravenous On call to O.R. 09/11/20 0912 09/14/20 1545       Patient was given sequential compression devices, early ambulation, and chemoprophylaxis to prevent DVT.  Patient benefited maximally from hospital stay and there were no complications.    Recent vital signs:  Patient Vitals for the past 24 hrs:  BP Temp Temp src Pulse Resp SpO2 Height Weight  09/15/20 0611 114/82 97.7 F (36.5 C) Oral 71 18 93 % -- --  09/14/20 2257 124/67 98 F (36.7 C) Oral 73 18 96 % -- --  09/14/20 1952 135/76 97.8 F (36.6 C) Oral 76 18 96 % -- --  09/14/20 1556 114/61 (!) 97.5 F (36.4 C) Oral 70 16 99 % -- --  09/14/20 1535 -- 97.8 F (36.6 C) -- -- -- -- -- --  09/14/20 1520 138/68 -- -- 65 12 100 % -- --  09/14/20 1450 121/68 -- -- 70 18 96 % -- --  09/14/20 1440 120/68 -- -- 69 20 95 % -- --  09/14/20 1435 125/68 -- -- (!) 59 18 94 % -- --  09/14/20 1430 -- -- -- (!) 58 12 100 % -- --  09/14/20 1420 135/86 -- -- 63 16 100 % -- --  09/14/20 1405 125/66 -- -- -- 20 -- -- --  09/14/20 1350 (!) 146/84 (!) 97 F (36.1 C) -- 67 12 94 % -- --  09/14/20 1125 (!) 141/57 -- -- 70 19 100 % -- --  09/14/20 1120 (!) 142/89 -- -- 73 14 100 % -- --  09/14/20 1115 (!) 148/67 -- -- 70 19 100 % -- --  09/14/20 1110 (!) 160/66 -- -- 66 16 100 % -- --  09/14/20 1028 (!) 148/71 98.2 F (36.8 C) Temporal 77 18 99 % 5\' 1"  (1.549 m) 92.6 kg     Recent laboratory studies:  Recent Labs    09/15/20 0547  WBC 10.7*  HGB 10.5*  HCT 32.9*  PLT 211  NA 139  K 5.6*  CL 104  CO2 25  BUN 17  CREATININE 1.03*  GLUCOSE 117*  CALCIUM 9.3     Discharge Medications:    Allergies as of 09/15/2020   No Known Allergies     Medication List    TAKE these medications   amitriptyline 25 MG tablet Commonly known as: ELAVIL TAKE 2 TABLETS(50 MG) BY MOUTH AT BEDTIME. MAY INCREASE TO 75 MG AT BEDTIME AFTER 2 WEEKS   aspirin EC 81 MG tablet Take 1 tablet (81 mg total) by mouth 2 (two) times daily. To be taken after surgery to prevent blood clots   b complex vitamins tablet Take 1 tablet by mouth daily.   cholecalciferol 25 MCG (1000 UNIT) tablet Commonly known as: VITAMIN D3 Take 1,000 Units by mouth daily.   ciprofloxacin 500 MG tablet Commonly known as: Cipro Take 1 tablet (500 mg total) by mouth 2 (two) times daily for 10 days.   docusate sodium 100 MG capsule Commonly known as: Colace Take 1 capsule (100 mg total) by mouth daily as needed.   ferrous sulfate 325 (65 FE) MG EC tablet Take 325 mg by mouth 3 (three) times daily with meals.   FLUoxetine 40 MG capsule Commonly known as: PROZAC Take 40 mg by mouth every morning.   gabapentin 300 MG capsule Commonly known as: NEURONTIN Take 1 capsule (300 mg total) by mouth 3 (three) times daily.   hydrOXYzine 10 MG tablet Commonly known as: ATARAX/VISTARIL Take 10 mg by mouth 3 (three) times daily as needed for anxiety.   letrozole 2.5 MG tablet Commonly known as: FEMARA TAKE 1 TABLET BY MOUTH DAILY   methocarbamol 500 MG tablet Commonly known as: Robaxin Take 1 tablet (500 mg total) by mouth 2 (two) times daily as needed. To be taken after surgery as needed   omeprazole 40 MG capsule Commonly known as: PRILOSEC TAKE 1 CAPSULE(40 MG) BY MOUTH TWICE DAILY FOR 10 DAYS   ondansetron 4 MG tablet Commonly known as: Zofran Take 1 tablet (4 mg total) by mouth every 8 (eight) hours as needed for nausea or vomiting.   oxyCODONE-acetaminophen 5-325 MG tablet Commonly known as: Percocet Take 1-2 tablets by mouth every 6 (six) hours as needed. To be taken after surgery as needed   traZODone  100 MG tablet Commonly known as: DESYREL Take 100 mg by mouth at bedtime.   vitamin B-12 500 MCG tablet Commonly known as: CYANOCOBALAMIN Take 500 mcg by mouth daily.  Durable Medical Equipment  (From admission, onward)         Start     Ordered   09/14/20 1604  DME Walker rolling  Once       Question:  Patient needs a walker to treat with the following condition  Answer:  Total knee replacement status   09/14/20 1603   09/14/20 1604  DME 3 n 1  Once        09/14/20 1603   09/14/20 1604  DME Bedside commode  Once       Question:  Patient needs a bedside commode to treat with the following condition  Answer:  Total knee replacement status   09/14/20 1603          Diagnostic Studies: DG Chest 2 View  Result Date: 09/11/2020 CLINICAL DATA:  Right knee osteoarthritis. EXAM: CHEST - 2 VIEW COMPARISON:  None. FINDINGS: Patient is slightly rotated to the right. Lungs are adequately inflated and otherwise clear. Cardiomediastinal silhouette is normal. Minimal degenerative change of the spine. Mild elevation the right hemidiaphragm. Surgical clips over the right breast and axilla. IMPRESSION: No acute findings. Electronically Signed   By: Marin Olp M.D.   On: 09/11/2020 15:12   DG Knee Right Port  Result Date: 09/14/2020 CLINICAL DATA:  Status post right knee replacement EXAM: PORTABLE RIGHT KNEE - 2 VIEW COMPARISON:  None. FINDINGS: Right knee prosthesis is noted in satisfactory position. No acute bony or soft tissue abnormality is seen. IMPRESSION: Status post right knee replacement Electronically Signed   By: Inez Catalina M.D.   On: 09/14/2020 14:14    Disposition: Discharge disposition: 01-Home or Self Care          Follow-up Information    Leandrew Koyanagi, MD. Schedule an appointment as soon as possible for a visit in 2 week(s).   Specialty: Orthopedic Surgery Contact information: [address] [place] [address] [phone]                 Signed: Aundra Dubin 09/15/2020, 7:45 AM

## 2020-09-15 NOTE — Discharge Instructions (Signed)

## 2020-09-15 NOTE — Progress Notes (Signed)
Subjective: 1 Day Post-Op Procedure(s) (LRB): RIGHT TOTAL KNEE ARTHROPLASTY (Right) Patient reports pain as mild.    Objective: Vital signs in last 24 hours: Temp:  [97 F (36.1 C)-98.2 F (36.8 C)] 97.7 F (36.5 C) (11/23 0611) Pulse Rate:  [58-77] 71 (11/23 0611) Resp:  [12-20] 18 (11/23 0611) BP: (114-160)/(57-89) 114/82 (11/23 0611) SpO2:  [93 %-100 %] 93 % (11/23 0611) Weight:  [92.6 kg] 92.6 kg (11/22 1028)  Intake/Output from previous day: 11/22 0701 - 11/23 0700 In: 850 [I.V.:750; IV Piggyback:100] Out: 25 [Blood:25] Intake/Output this shift: No intake/output data recorded.  Recent Labs    09/15/20 0547  HGB 10.5*   Recent Labs    09/15/20 0547  WBC 10.7*  RBC 3.42*  HCT 32.9*  PLT 211   Recent Labs    09/15/20 0547  NA 139  K 5.6*  CL 104  CO2 25  BUN 17  CREATININE 1.03*  GLUCOSE 117*  CALCIUM 9.3   No results for input(s): LABPT, INR in the last 72 hours.  Neurologically intact Neurovascular intact Sensation intact distally Intact pulses distally Dorsiflexion/Plantar flexion intact Incision: scant drainage No cellulitis present Compartment soft   Assessment/Plan: 1 Day Post-Op Procedure(s) (LRB): RIGHT TOTAL KNEE ARTHROPLASTY (Right) Advance diet Up with therapy Discharge home with home health after second PT session as long as she continues to progress WBAT RLE ABLA- mild and stable Continue with IV/po fluids for decreased renal function   Anticipated LOS equal to or greater than 2 midnights due to - Age 69 and older with one or more of the following:  - Obesity  - Expected need for hospital services (PT, OT, Nursing) required for safe  discharge  - Anticipated need for postoperative skilled nursing care or inpatient rehab  - Active co-morbidities: None OR   - Unanticipated findings during/Post Surgery: Slow post-op progression: GI, pain control, mobility  - Patient is a high risk of re-admission due to: None    Aundra Dubin 09/15/2020, 7:43 AM

## 2020-09-15 NOTE — Plan of Care (Signed)
Pt doing well. Pt and son given D/C instructions with verbal understanding. Rx's were sent to the pharmacy by MD. Pt's incision is clean and dry with no sign of infection. Pt's IV was removed prior to D/C. Pt D/C'd home via wheelchair per MD order. Home Health was arranged prior to admission. Tub bench will be sent to the home per [employer]. Pt is stable @ D/C and has no other needs at this time. Holli Humbles, RN

## 2020-09-15 NOTE — Evaluation (Signed)
Occupational Therapy Evaluation Patient Details Name: Amy Roman MRN: [id number] DOB: 1951/07/11 Today's Date: 09/15/2020    History of Present Illness Pt is a 69 y/o female s/p R TKA. PMH includes breast cancer, and brain cancer.    Clinical Impression   PTA, pt reports Modified Independence with ADLs, IADLs and mobility with occasional use of RW. Pt presents with deficits in dynamic standing balance, endurance and pain in R knee (though improving). Pt overall Supervision for sit to stand transfers with RW, Setup for UB ADLs and Min A for LB ADLs due to difficulty reaching B feet. Pt may benefit from AE education to improve ability to complete LB ADLs.  Pt reports her son plans to stay with her for at least one week to assist with IADLs and maintain safety at home. Pt would benefit from tub bench to decrease fall risk during ADLs. Recommend HHOT follow-up at discharge.     Follow Up Recommendations  Home health OT    Equipment Recommendations  Tub/shower bench    Recommendations for Other Services       Precautions / Restrictions Precautions Precautions: Knee Precaution Booklet Issued: No Restrictions Weight Bearing Restrictions: Yes RLE Weight Bearing: Weight bearing as tolerated      Mobility Bed Mobility               General bed mobility comments: received sitting in recliner     Transfers Overall transfer level: Needs assistance Equipment used: Rolling walker (2 wheeled) Transfers: Sit to/from Stand Sit to Stand: Supervision         General transfer comment: Supervision for safety, no assist needed during sit to stands with RW while dressing    Balance Overall balance assessment: Needs assistance Sitting-balance support: No upper extremity supported;Feet supported Sitting balance-Leahy Scale: Fair     Standing balance support: Single extremity supported;Bilateral upper extremity supported;During functional activity Standing balance-Leahy  Scale: Fair Standing balance comment: fair static standing while dressing, use of UE support for dynamic tasks                           ADL either performed or assessed with clinical judgement   ADL Overall ADL's : Needs assistance/impaired Eating/Feeding: Independent;Sitting   Grooming: Supervision/safety;Standing   Upper Body Bathing: Sitting;Set up   Lower Body Bathing: Minimal assistance;Sit to/from stand;Sitting/lateral leans   Upper Body Dressing : Set up;Sitting Upper Body Dressing Details (indicate cue type and reason): Setup to [facility] hospital gown, don dress and sweater seated and in standing Lower Body Dressing: Minimal assistance;Sit to/from stand Lower Body Dressing Details (indicate cue type and reason): Min A for donning pants around B feet due to difficulty reaching. Pt noted to bend to attempt donning shoes later Toilet Transfer: Min guard;Ambulation;Regular Toilet;RW   Toileting- Clothing Manipulation and Hygiene: Supervision/safety;Sit to/from stand;Sitting/lateral lean         General ADL Comments: Pt with limited ability to reach B feet consistent with knee replacement     Vision Baseline Vision/History: No visual deficits Patient Visual Report: No change from baseline Vision Assessment?: No apparent visual deficits     Perception     Praxis      Pertinent Vitals/Pain Pain Assessment: Faces Faces Pain Scale: Hurts a little bit Pain Location: R knee  Pain Descriptors / Indicators: Aching Pain Intervention(s): Monitored during session;Premedicated before session;Other (comment) (ice machine applied on entry)     Hand Dominance Right   Extremity/Trunk Assessment  Upper Extremity Assessment Upper Extremity Assessment: Overall WFL for tasks assessed;RUE deficits/detail RUE Deficits / Details: Swelling noted in upper R UE, similar appearance to lymphedema   Lower Extremity Assessment Lower Extremity Assessment: Defer to PT evaluation    Cervical / Trunk Assessment Cervical / Trunk Assessment: Normal   Communication Communication Communication: Prefers language other than English (Arabic, but speaks English functionally)   Cognition Arousal/Alertness: Awake/alert Behavior During Therapy: WFL for tasks assessed/performed Overall Cognitive Status: Within Functional Limits for tasks assessed                                     General Comments  Educated on use of tub bench to maximize safety and ability to complete showering tasks without assistance. Pt reports with her religion that neither female or males should see/assist with tasks where private areas are exposed. Assisted in maintaining dignity and avoiding exposure during dressing tasks    Exercises     Shoulder Instructions      Home Living Family/patient expects to be discharged to:: Private residence Living Arrangements: Children Available Help at Discharge: Family;Available 24 hours/day Type of Home: House Home Access: Stairs to enter [employer] of Steps: 4 Entrance Stairs-Rails: Left Home Layout: One level     Bathroom Shower/Tub: Teacher, early years/pre: Standard     Home Equipment: Environmental consultant - 2 wheels;Bedside commode   Additional Comments: Pt reports one son lives in [place] and another in [place] (2 others sons live further away). Sons can assist with IADLs      Prior Functioning/Environment Level of Independence: Independent with assistive device(s)        Comments: occasionally uses RW. Independent with ADLs, IADLs prior to surgery        OT Problem List: Decreased strength;Decreased activity tolerance;Impaired balance (sitting and/or standing);Decreased knowledge of use of DME or AE;Pain      OT Treatment/Interventions: Self-care/ADL training;Therapeutic exercise;DME and/or AE instruction;Therapeutic activities;Patient/family education    OT Goals(Current goals can be found in the care plan  section) Acute Rehab OT Goals Patient Stated Goal: go home today OT Goal Formulation: With patient Time For Goal Achievement: 09/29/20 Potential to Achieve Goals: Good ADL Goals Pt Will Perform Grooming: with modified independence;standing Pt Will Perform Lower Body Bathing: with modified independence;sit to/from stand;sitting/lateral leans Pt Will Perform Lower Body Dressing: with modified independence;sitting/lateral leans;sit to/from stand Pt Will Transfer to Toilet: with modified independence;ambulating;bedside commode Pt Will Perform Toileting - Clothing Manipulation and hygiene: with modified independence;sitting/lateral leans;sit to/from stand Pt Will Perform Tub/Shower Transfer: Tub transfer;with modified independence;ambulating;tub bench;rolling walker  OT Frequency: Min 2X/week   Barriers to D/C:            Co-evaluation              AM-PAC OT "6 Clicks" Daily Activity     Outcome Measure Help from another person eating meals?: None Help from another person taking care of personal grooming?: A Little Help from another person toileting, which includes using toliet, bedpan, or urinal?: A Little Help from another person bathing (including washing, rinsing, drying)?: A Little Help from another person to put on and taking off regular upper body clothing?: A Little Help from another person to put on and taking off regular lower body clothing?: A Little 6 Click Score: 19   End of Session Equipment Utilized During Treatment: Rolling walker Nurse Communication: Mobility status  Activity  Tolerance: Patient tolerated treatment well Patient left: in chair;with call bell/phone within reach;Other (comment) (with PT)  OT Visit Diagnosis: Other abnormalities of gait and mobility (R26.89);Pain Pain - Right/Left: Right Pain - part of body: Knee                Time: 3009-2330 OT Time Calculation (min): 22 min Charges:  OT General Charges $OT Visit: 1 Visit OT Evaluation $OT  Eval Low Complexity: 1 Low  Layla Maw, OTR/L  Layla Maw 09/15/2020, 8:56 AM

## 2020-09-15 NOTE — Progress Notes (Addendum)
Physical Therapy Treatment Patient Details Name: Amy Roman MRN: [id number] DOB: July 13, 1951 Today's Date: 09/15/2020    History of Present Illness Pt is a 69 y/o female s/p R TKA on 09/14/20. PMH includes breast cancer (s/p radiation), brain cancer.   PT Comments    Pt progressing well with mobility. Today's session focused on transfer and gait training with RW, pt's stability improving with this and standing ADL tasks. Endorses BUE soreness (suspect secondary to increased reliance on RW support), improving with frequent breaks during ambulation for bilateral shoulder/elbow AROM. Pt preparing for d/c home this afternoon; will plan for additional session today.   Follow Up Recommendations  Home health PT;Supervision for mobility/OOB;Follow surgeon's recommendation for DC plan and follow-up therapies     Equipment Recommendations   Tub bench   Recommendations for Other Services       Precautions / Restrictions Precautions Precautions: Knee Precaution Booklet Issued: No Restrictions Weight Bearing Restrictions: Yes RLE Weight Bearing: Weight bearing as tolerated    Mobility  Bed Mobility Overal bed mobility: Needs Assistance Bed Mobility: Sit to Supine       Sit to supine: Min assist   General bed mobility comments: MinA for LE management return to supine  Transfers Overall transfer level: Needs assistance Equipment used: Rolling walker (2 wheeled) Transfers: Sit to/from Stand Sit to Stand: Supervision         General transfer comment: Multiple sit<>stands from recliner and chair to RW, supervision for safety  Ambulation/Gait Ambulation/Gait assistance: Min guard;Supervision Gait Distance (Feet): 300 Feet Assistive device: Rolling walker (2 wheeled) Gait Pattern/deviations: Step-through pattern;Decreased stride length;Antalgic;Trunk flexed Gait velocity: Decreased   General Gait Details: Slow, fatigued gait with RW and intermittent min guard for  balance; multiple standing rest breaks to lean on walker and move sore BUEs   Stairs             Wheelchair Mobility    Modified Rankin (Stroke Patients Only)       Balance Overall balance assessment: Needs assistance Sitting-balance support: No upper extremity supported;Feet supported Sitting balance-Leahy Scale: Fair     Standing balance support: Single extremity supported;Bilateral upper extremity supported;During functional activity Standing balance-Leahy Scale: Fair Standing balance comment: fair static standing while dressing, use of UE support for dynamic tasks                            Cognition Arousal/Alertness: Awake/alert Behavior During Therapy: WFL for tasks assessed/performed Overall Cognitive Status: Within Functional Limits for tasks assessed                                 General Comments: WFL for simple tasks; likely decreased attention as pt stops walking/performing mobility ask to talk with difficulty multi-tasking      Exercises      General Comments General comments (skin integrity, edema, etc.): Educated on use of tub bench to maximize safety and ability to complete showering tasks without assistance. Pt reports with her religion that neither female or males should see/assist with tasks where private areas are exposed. Assisted in maintaining dignity and avoiding exposure during dressing tasks      Pertinent Vitals/Pain Pain Assessment: Faces Faces Pain Scale: Hurts a little bit Pain Location: BUEs (soreness) > R knee Pain Descriptors / Indicators: Tiring;Sore Pain Intervention(s): Limited activity within patient's tolerance;Other (comment) (periods of BUE AROM to give arms a "  break" from walker use with ambulation)    Home Living Family/patient expects to be discharged to:: Private residence Living Arrangements: Children Available Help at Discharge: Family;Available 24 hours/day Type of Home: House Home  Access: Stairs to enter Entrance Stairs-Rails: Left Home Layout: One level Home Equipment: Walker - 2 wheels;Bedside commode Additional Comments: Pt reports one son lives in [place] and another in [place] (2 others sons live further away). Sons can assist with IADLs    Prior Function Level of Independence: Independent with assistive device(s)      Comments: occasionally uses RW. Independent with ADLs, IADLs prior to surgery   PT Goals (current goals can now be found in the care plan section) Acute Rehab PT Goals Patient Stated Goal: go home today Progress towards PT goals: Progressing toward goals    Frequency    7X/week      PT Plan      Co-evaluation              AM-PAC PT "6 Clicks" Mobility   Outcome Measure  Help needed turning from your back to your side while in a flat bed without using bedrails?: A Little Help needed moving from lying on your back to sitting on the side of a flat bed without using bedrails?: A Little Help needed moving to and from a bed to a chair (including a wheelchair)?: A Little Help needed standing up from a chair using your arms (e.g., wheelchair or bedside chair)?: None Help needed to walk in hospital room?: A Little Help needed climbing 3-5 steps with a railing? : A Lot 6 Click Score: 18    End of Session   Activity Tolerance: Patient tolerated treatment well Patient left: in bed;with call bell/phone within reach;with bed alarm set Nurse Communication: Mobility status PT Visit Diagnosis: Unsteadiness on feet (R26.81);Muscle weakness (generalized) (M62.81);Other abnormalities of gait and mobility (R26.89);Pain Pain - Right/Left: Right Pain - part of body: Knee     Time: 0254-2706 PT Time Calculation (min) (ACUTE ONLY): 26 min  Charges:  $Gait Training: 8-22 mins $Therapeutic Activity: 8-22 mins                     Mabeline Caras, PT, DPT Acute Rehabilitation Services  Pager [phone] Office [place] 09/15/2020, 9:53 AM

## 2020-09-29 ENCOUNTER — Encounter: Payer: Self-pay | Admitting: Orthopaedic Surgery

## 2020-09-29 ENCOUNTER — Ambulatory Visit (INDEPENDENT_AMBULATORY_CARE_PROVIDER_SITE_OTHER): Payer: Medicare Other | Admitting: Physician Assistant

## 2020-09-29 DIAGNOSIS — Z96651 Presence of right artificial knee joint: Secondary | ICD-10-CM

## 2020-09-29 MED ORDER — OXYCODONE-ACETAMINOPHEN 5-325 MG PO TABS
1.0000 | ORAL_TABLET | Freq: Four times a day (QID) | ORAL | 0 refills | Status: AC | PRN
Start: 2020-09-29 — End: ?

## 2020-09-29 NOTE — Progress Notes (Signed)
Post-Op Visit Note   Patient: Amy Roman           Date of Birth: 08-Dec-1950           MRN: [id number] Visit Date: 09/29/2020 PCP: Lin Landsman, MD   Assessment & Plan:  Chief Complaint:  Chief Complaint  Patient presents with  . Right Knee - Routine Post Op, Follow-up   Visit Diagnoses:  1. History of total knee replacement, right     Plan: Patient is a pleasant 69 year old female who comes in today 2 weeks out right total knee replacement.  She has been doing well.  She has had increased pain at night which is not relieved with oxycodone.  She has been getting home health physical therapy and is ambulating a walker at home.  Examination of her right knee reveals a well-healing surgical incision with nylon sutures in place.  No evidence of infection or cellulitis.  Calf is soft nontender.  She is neurovascular intact distally.  Today, sutures were removed and Steri-Strips applied.  We have sent in a referral for outpatient physical therapy.  Dental prophylaxis reinforced.  I have refilled her Percocet.  She will follow up with [place] in 4 weeks time for repeat evaluation and 2 view x-rays of the right knee.  Follow-Up Instructions: Return in about 4 weeks (around 10/27/2020).   Orders:  Orders Placed This Encounter  Procedures  . Ambulatory referral to Physical Therapy   Meds ordered this encounter  Medications  . oxyCODONE-acetaminophen (PERCOCET) 5-325 MG tablet    Sig: Take 1-2 tablets by mouth every 6 (six) hours as needed.    Dispense:  40 tablet    Refill:  0    Imaging: No new imaging  PMFS History: Patient Active Problem List   Diagnosis Date Noted  . Status post total knee replacement, right 09/14/2020  . Primary osteoarthritis of right knee 09/13/2020  . Iron deficiency anemia 07/23/2018  . Breast cancer metastasized to axillary lymph node, right ([place]) 04/17/2018  . Malignant neoplasm of 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, estrogen receptor  positive ([place]) 03/11/2018  . Post-operative state 03/06/2018  . Uterine hyperplasia 10/11/2017  . Anxiety and depression 10/15/2014  . Vitamin B 12 deficiency 06/02/2014  . Vitamin D deficiency 06/02/2014  . Hereditary and idiopathic peripheral neuropathy 05/14/2014   Past Medical History:  Diagnosis Date  . Anemia   . Anxiety and depression 10/15/2014  . Arthritis    BILATERAL KNEES, HAD CORTISONE INJECTION 03/2017  . Brain cancer ([place])   . Breast cancer ([place]) 2019   Right Breast Cancer  . Depression   . GERD (gastroesophageal reflux disease)    OCCASIONAL WITH CERTAIN FOOD  . Headache   . Heart murmur   . History of radiation therapy 05/28/18- 07/10/18   Right Breast 25 fractions for a total dose of 50 Gy, Right SCV and PAB 25 fractions for a total dose of 50 Gy, right breast boost, 5 fractions for a total dose of 10 Gy.   . Personal history of radiation therapy    Right Breast Cancer  . Seasonal allergies   . SVD (spontaneous vaginal delivery)    x 4  . Unspecified hereditary and idiopathic peripheral neuropathy 05/14/2014   LEFT KNEE AND FOOT    Family History  Problem Relation Age of Onset  . Diabetes Mother   . Neuropathy Mother   . Neuropathy Sister   . Cancer Sister  unknown type cancer in neck   . Other Father   . Cancer Maternal Aunt        leukemia   . Colon cancer Neg Hx   . Breast cancer Neg Hx     Past Surgical History:  Procedure Laterality Date  . ABDOMINAL HYSTERECTOMY    . AXILLARY LYMPH NODE DISSECTION Right 04/17/2018   Procedure: AXILLARY LYMPH NODE DISSECTION;  Surgeon: Stark Klein, MD;  Location: [facility];  Service: General;  Laterality: Right;  . BREAST EXCISIONAL BIOPSY Right 2007  . BREAST LUMPECTOMY Right 2019  . BREAST LUMPECTOMY WITH RADIOACTIVE SEED AND SENTINEL LYMPH NODE BIOPSY Right 04/05/2018   Procedure: RIGHT BREAST SEED BRACKETED LUMPECTOMY WITH SENTINEL LYMPH NODE BIOPSY;  Surgeon: Stark Klein, MD;   Location: [facility];  Service: General;  Laterality: Right;  . BREAST SURGERY     bx, no cancer   . CATARACT EXTRACTION Bilateral   . DILATATION & CURETTAGE/HYSTEROSCOPY WITH MYOSURE N/A 08/09/2017   Procedure: DILATATION & CURETTAGE/HYSTEROSCOPY WITH MYOSURE;  Surgeon: Emily Filbert, MD;  Location: [facility];  Service: Gynecology;  Laterality: N/A;  . LABIOPLASTY  03/06/2018   Procedure: LABIAPLASTY;  Surgeon: Emily Filbert, MD;  Location: [facility];  Service: Gynecology;;  . TOTAL KNEE ARTHROPLASTY Right 09/14/2020   Procedure: RIGHT TOTAL KNEE ARTHROPLASTY;  Surgeon: Leandrew Koyanagi, MD;  Location: [facility];  Service: Orthopedics;  Laterality: Right;  Marland Kitchen VAGINAL HYSTERECTOMY Bilateral 03/06/2018   Procedure: HYSTERECTOMY VAGINAL WITH BILATERAL SALPINGOOPHORECTOMY;  Surgeon: Emily Filbert, MD;  Location: [facility];  Service: Gynecology;  Laterality: Bilateral;   Social History   Occupational History  . Occupation: house   Tobacco Use  . Smoking status: Never Smoker  . Smokeless tobacco: Never Used  Vaping Use  . Vaping Use: Never used  Substance and Sexual Activity  . Alcohol use: No  . Drug use: No  . Sexual activity: Not on file

## 2020-10-01 ENCOUNTER — Ambulatory Visit: Payer: Medicare Other | Attending: Physician Assistant | Admitting: Physical Therapy

## 2020-10-01 ENCOUNTER — Other Ambulatory Visit: Payer: Self-pay

## 2020-10-01 ENCOUNTER — Encounter: Payer: Self-pay | Admitting: Physical Therapy

## 2020-10-01 DIAGNOSIS — M25661 Stiffness of right knee, not elsewhere classified: Secondary | ICD-10-CM | POA: Insufficient documentation

## 2020-10-01 DIAGNOSIS — M6281 Muscle weakness (generalized): Secondary | ICD-10-CM | POA: Diagnosis present

## 2020-10-01 DIAGNOSIS — M25561 Pain in right knee: Secondary | ICD-10-CM | POA: Diagnosis present

## 2020-10-01 DIAGNOSIS — R262 Difficulty in walking, not elsewhere classified: Secondary | ICD-10-CM | POA: Insufficient documentation

## 2020-10-01 DIAGNOSIS — R600 Localized edema: Secondary | ICD-10-CM | POA: Insufficient documentation

## 2020-10-01 NOTE — Therapy (Signed)
[place], [place], [address] Phone: [phone]   Fax:  [phone]  Physical Therapy Evaluation  Patient Details  Name: Amy Roman MRN: [id number] Date of Birth: [birth year] Referring Provider (PT): Dwana Melena, [place]   Encounter Date: 10/01/2020   PT End of Session - 10/01/20 2013    Visit Number 1    Number of Visits 18    Date for PT Re-Evaluation 11/12/20    Authorization Type Medicare, progress note by visit 10    PT Start Time 1635    PT Stop Time 1717    PT Time Calculation (min) 42 min    Activity Tolerance Other (comment)   unable tolerate supine mat positioning during eval otherwise session well-tolerated   Behavior During Therapy [facility] for tasks assessed/performed           Past Medical History:  Diagnosis Date  . Anemia   . Anxiety and depression 10/15/2014  . Arthritis    BILATERAL KNEES, HAD CORTISONE INJECTION 03/2017  . Brain cancer ([place])   . Breast cancer ([place]) 2019   Right Breast Cancer  . Depression   . GERD (gastroesophageal reflux disease)    OCCASIONAL WITH CERTAIN FOOD  . Headache   . Heart murmur   . History of radiation therapy 05/28/18- 07/10/18   Right Breast 25 fractions for a total dose of 50 Gy, Right SCV and PAB 25 fractions for a total dose of 50 Gy, right breast boost, 5 fractions for a total dose of 10 Gy.   . Personal history of radiation therapy    Right Breast Cancer  . Seasonal allergies   . SVD (spontaneous vaginal delivery)    x 4  . Unspecified hereditary and idiopathic peripheral neuropathy 05/14/2014   LEFT KNEE AND FOOT    Past Surgical History:  Procedure Laterality Date  . ABDOMINAL HYSTERECTOMY    . AXILLARY LYMPH NODE DISSECTION Right 04/17/2018   Procedure: AXILLARY LYMPH NODE DISSECTION;  Surgeon: Stark Klein, MD;  Location: [place];  Service: General;  Laterality: Right;  . BREAST EXCISIONAL BIOPSY Right 2007   . BREAST LUMPECTOMY Right 2019  . BREAST LUMPECTOMY WITH RADIOACTIVE SEED AND SENTINEL LYMPH NODE BIOPSY Right 04/05/2018   Procedure: RIGHT BREAST SEED BRACKETED LUMPECTOMY WITH SENTINEL LYMPH NODE BIOPSY;  Surgeon: Stark Klein, MD;  Location: [place];  Service: General;  Laterality: Right;  . BREAST SURGERY     bx, no cancer   . CATARACT EXTRACTION Bilateral   . DILATATION & CURETTAGE/HYSTEROSCOPY WITH MYOSURE N/A 08/09/2017   Procedure: DILATATION & CURETTAGE/HYSTEROSCOPY WITH MYOSURE;  Surgeon: Emily Filbert, MD;  Location: [facility];  Service: Gynecology;  Laterality: N/A;  . LABIOPLASTY  03/06/2018   Procedure: LABIAPLASTY;  Surgeon: Emily Filbert, MD;  Location: [facility];  Service: Gynecology;;  . TOTAL KNEE ARTHROPLASTY Right 09/14/2020   Procedure: RIGHT TOTAL KNEE ARTHROPLASTY;  Surgeon: Leandrew Koyanagi, MD;  Location: [place];  Service: Orthopedics;  Laterality: Right;  Marland Kitchen VAGINAL HYSTERECTOMY Bilateral 03/06/2018   Procedure: HYSTERECTOMY VAGINAL WITH BILATERAL SALPINGOOPHORECTOMY;  Surgeon: Emily Filbert, MD;  Location: [facility];  Service: Gynecology;  Laterality: Bilateral;    There were no vitals filed for this visit.    Subjective Assessment - 10/01/20 1954    Subjective Pt. is a 69 y/o female referred to PT s/p right TKA 09/14/20. She underwent procedue secondary to 7-8 year history of worsening knee pain  issues secondary to underlying OA. She recently finished home health therapy and is currently ambulating with RW. She also reports issues with left knee pain secondary to OA as well. See pertinent history below.    Patient is accompained by: Interpreter   Arabic interpreter: Bedor Ehegomi   Pertinent History Right breast CA 2019 s/p lumpectomy and radiation, brain CA, chronic bilat. LE edema, depression and anxiety, left knee OA, neuropathy    Limitations Standing;House hold activities;Lifting;Walking    Diagnostic tests X-rays    Patient Stated Goals Get knee better     Currently in Pain? Yes    Pain Score 4     Pain Location Knee    Pain Orientation Right    Pain Descriptors / Indicators Burning;Stabbing    Pain Type Surgical pain    Pain Onset 1 to 4 weeks ago    Pain Frequency Intermittent    Aggravating Factors  bending knee, standing and walking    Pain Relieving Factors ice and medication    Effect of Pain on Daily Activities increased pain walking and standing and difficulty bending knee for ADLs              [facility] PT Assessment - 10/01/20 0001      Assessment   Medical Diagnosis s/p right total knee arthroplasty    Referring Provider (PT) Dwana Melena, PA-C    Onset Date/Surgical Date 09/14/20    Hand Dominance Right    Prior Therapy home health therapy s/p surgery      Precautions   Precaution Comments breast CA history 2019, lymphedema history      Restrictions   Weight Bearing Restrictions No      Balance Screen   Has the patient fallen in the past 6 months Yes    How many times? [place] residence    Living Arrangements Children    Type of [place] to enter    Entrance Stairs-Number of Steps [place] One level    [place] - 2 wheels      Prior Function   Level of [place] with community mobility without device   reports used RW 2-3 months prior to surgery   Vocation Retired      Associate Professor   Overall Cognitive Status Within Functional Limits for tasks assessed      Observation/Other Assessments   Observations right TKA incision appears healing with with no signs of infection, steri strips still in place, pt. has diffuse bilateral LE edema which she reports is chronic/"genetic"    Focus on Therapeutic Outcomes (FOTO)  not tested due to language barrier      Observation/Other Assessments-Edema    Edema --   diffuse bilateral LE edema     AROM   Overall AROM Comments  Limited ability to assess hip ROM as patient could not tolerate supine positioning on mat during eval but reports she has been able to do home health PT exercises in bed    AROM Assessment Site Knee    Right/Left Knee Right;Left    Right Knee Extension 4    Right Knee Flexion 90    Left Knee Extension 5    Left Knee Flexion 92      PROM   Overall PROM Comments right knee PROM consistent with AROM as noted above  PROM Assessment Site Knee      Strength   Strength Assessment Site Knee    Right/Left Knee Right;Left    Right Knee Flexion 5/5    Right Knee Extension 3-/5   based on partial AROM against gravity   Left Knee Flexion 5/5    Left Knee Extension 4-/5      Bed Mobility   Bed Mobility --   pt. unable to tolerate during eval/performed in sitting only     Transfers   Transfers Stand to Sit;Sit to Stand    Sit to Stand 6: Modified independent (Device/Increase time)   with RW   Stand to Sit 6: Modified independent (Device/Increase time)   with RW     Ambulation/Gait   Gait Comments Pt. ambulates in clinic mod I with RW with antalgic gait, forward trunk flexion with posture-provided tennis balls for rear of RW                      Objective measurements completed on examination: See above findings.       [facility] Adult PT Treatment/Exercise - 10/01/20 0001      Exercises   Exercises Knee/Hip      Knee/Hip Exercises: Seated   Other Seated Knee/Hip Exercises HEP instruction/review seated knee flexion and extension stretches, LAQ, sit<>stand, heel raises, reviewed supine bed exercises for quad sets, SAQ, heel slides                  PT Education - 10/01/20 2012    Education Details HEP, POC, importance knee ROM, etiology s/p surgery with expected tissue healing time    Person(s) Educated Patient    Methods Explanation;Demonstration;Verbal cues;Handout    Comprehension Verbalized understanding            PT Short Term Goals - 10/01/20 2023       PT SHORT TERM GOAL #1   Title Independent with initial HEP    Baseline updated from home health therapy at eval    Time 3    Period Weeks    Status New      PT SHORT TERM GOAL #2   Title Increase right knee flexion AROM at least 10 deg to improve ability for stair navigation and tranfers from low seated    Baseline 90 deg    Time 3    Period Weeks    Status New      PT SHORT TERM GOAL #3   Title Pt. to ambulate at least 100 feet mod I with SPC for progression to LTG for independent community level ambulation without AD    Baseline uses RW    Time 3    Period Weeks    Status New             PT Long Term Goals - 10/01/20 2025      PT LONG TERM GOAL #1   Title Increase right knee extension strength to at least 4/5 or greater to improve ability for transfers from low seats and to work towards stair navigation with reciprocal gait    Baseline 3-/5    Time 6    Period Weeks    Status New    Target Date 11/12/20      PT LONG TERM GOAL #2   Title Pt. to be able to perform independent household and short distance community ambulation for distances <500 feet without AD    Baseline uses RW, limited ambulation tolerance for community  mobilty    Time 6    Period Weeks    Status New    Target Date 11/12/20      PT LONG TERM GOAL #3   Title Increase right knee flexion AROM to 105 deg or greater and knee extension within 2 deg of neutral to improve ability for stair navigation, transfers from low seats abd bending knee for chores/cleaning    Baseline 4-90 deg flexion    Time 6    Period Weeks    Status New    Target Date 11/12/20      PT LONG TERM GOAL #4   Title Tolerate standing for activities such as cooking and shopping for periods at least 30-40 min with right knee pain 3/10 or less    Time 6    Period Weeks    Status New    Target Date 11/12/20                  Plan - 10/01/20 2015    Clinical Impression Statement Pt. presents with right knee pain,  stiffness/decreased ROM and muscle weakness just over 2 weeks s/p TKA surgery. Functional limitations include impaired gait with need RW use, increased difficulty with bed mobility and transfers as well as decreased balance s/p surgery. Ability for bed mobility also impacted by bilateral LE edema contributing to difficulty with sit<>supine in particular. Pt. would benefit from PT to help address current deficits to improve safety and functional status for mobility.    Personal Factors and Comorbidities Comorbidity 3+    Comorbidities LE edema, CA history, left knee OA, neuropathy, anxiety/depression    Examination-Activity Limitations Transfers;Carry;Dressing;Sleep;Locomotion Level;Stand;Stairs;Lift;Squat;Bathing;Bed Mobility;Toileting    Examination-Participation Restrictions Shop;Cleaning;Community Activity;Meal Prep    Stability/Clinical Decision Making Evolving/Moderate complexity    Clinical Decision Making Moderate    Rehab Potential Good    PT Frequency 3x / week    PT Duration 6 weeks    PT Treatment/Interventions ADLs/Self Care Home Management;Cryotherapy;Therapeutic activities;Therapeutic exercise;Neuromuscular re-education;Stair training;Gait training;Functional mobility training;Passive range of motion;Vasopneumatic Device;Patient/family education;Balance training    PT Next Visit Plan no estim due to recent CA history,try low mat to see if this improves ability for sit<>supine/tolerance supine positioning, work on knee ROM sitting vs. supine pending positional tolerance, try Airex heel raises and marches, step stretch right knee flexion, hip abd SLR in standing, seated and supine (pending positional tolerance) knee and hip strengthening-quad sets and SAQ, heel slides    PT Home Exercise Plan Access code: [email or web address]    Consulted and Agree with Plan of Care Patient           Patient will benefit from skilled therapeutic intervention in order to improve the following deficits and  impairments:  Abnormal gait,Difficulty walking,Decreased balance,Decreased mobility,Pain,Decreased strength,Decreased activity tolerance,Decreased range of motion  Visit Diagnosis: Right knee pain, unspecified chronicity  Stiffness of right knee, not elsewhere classified  Difficulty in walking, not elsewhere classified  Muscle weakness (generalized)  Localized edema     Problem List Patient Active Problem List   Diagnosis Date Noted  . Status post total knee replacement, right 09/14/2020  . Primary osteoarthritis of right knee 09/13/2020  . Iron deficiency anemia 07/23/2018  . Breast cancer metastasized to axillary lymph node, right ([place]) 04/17/2018  . Malignant neoplasm of 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, estrogen receptor positive ([place]) 03/11/2018  . Post-operative state 03/06/2018  . Uterine hyperplasia 10/11/2017  . Anxiety and depression 10/15/2014  . Vitamin B 12 deficiency 06/02/2014  .  Vitamin D deficiency 06/02/2014  . Hereditary and idiopathic peripheral neuropathy 05/14/2014    Beaulah Dinning, PT, DPT 10/01/20 8:33 PM  [facility] [address] [place], [place], [address] Phone: [phone]   Fax:  [phone]  Name: JANEISHA RYLE MRN: [id number] Date of Birth: 1951-07-29

## 2020-10-08 ENCOUNTER — Other Ambulatory Visit: Payer: Self-pay

## 2020-10-08 ENCOUNTER — Encounter: Payer: Self-pay | Admitting: Physical Therapy

## 2020-10-08 ENCOUNTER — Ambulatory Visit: Payer: Medicare Other | Admitting: Physical Therapy

## 2020-10-08 DIAGNOSIS — M6281 Muscle weakness (generalized): Secondary | ICD-10-CM

## 2020-10-08 DIAGNOSIS — M25561 Pain in right knee: Secondary | ICD-10-CM | POA: Diagnosis not present

## 2020-10-08 DIAGNOSIS — M25661 Stiffness of right knee, not elsewhere classified: Secondary | ICD-10-CM

## 2020-10-08 DIAGNOSIS — R600 Localized edema: Secondary | ICD-10-CM

## 2020-10-08 DIAGNOSIS — R262 Difficulty in walking, not elsewhere classified: Secondary | ICD-10-CM

## 2020-10-08 NOTE — Therapy (Signed)
[place], [place], [address] Phone: [phone]   Fax:  [phone]  Physical Therapy Treatment  Patient Details  Name: Amy Roman MRN: [id number] Date of Birth: 06/15/1951 Referring Provider (PT): Dwana Melena, [place]   Encounter Date: 10/08/2020   PT End of Session - 10/08/20 1724    Visit Number 2    Number of Visits 18    Date for PT Re-Evaluation 11/12/20    Authorization Type Medicare, progress note by visit 10    PT Start Time 1637    PT Stop Time 1715    PT Time Calculation (min) 38 min    Activity Tolerance Patient tolerated treatment well    Behavior During Therapy [facility] for tasks assessed/performed           Past Medical History:  Diagnosis Date  . Anemia   . Anxiety and depression 10/15/2014  . Arthritis    BILATERAL KNEES, HAD CORTISONE INJECTION 03/2017  . Brain cancer ([place])   . Breast cancer ([place]) 2019   Right Breast Cancer  . Depression   . GERD (gastroesophageal reflux disease)    OCCASIONAL WITH CERTAIN FOOD  . Headache   . Heart murmur   . History of radiation therapy 05/28/18- 07/10/18   Right Breast 25 fractions for a total dose of 50 Gy, Right SCV and PAB 25 fractions for a total dose of 50 Gy, right breast boost, 5 fractions for a total dose of 10 Gy.   . Personal history of radiation therapy    Right Breast Cancer  . Seasonal allergies   . SVD (spontaneous vaginal delivery)    x 4  . Unspecified hereditary and idiopathic peripheral neuropathy 05/14/2014   LEFT KNEE AND FOOT    Past Surgical History:  Procedure Laterality Date  . ABDOMINAL HYSTERECTOMY    . AXILLARY LYMPH NODE DISSECTION Right 04/17/2018   Procedure: AXILLARY LYMPH NODE DISSECTION;  Surgeon: Stark Klein, MD;  Location: [place];  Service: General;  Laterality: Right;  . BREAST EXCISIONAL BIOPSY Right 2007  . BREAST LUMPECTOMY Right 2019  . BREAST LUMPECTOMY WITH  RADIOACTIVE SEED AND SENTINEL LYMPH NODE BIOPSY Right 04/05/2018   Procedure: RIGHT BREAST SEED BRACKETED LUMPECTOMY WITH SENTINEL LYMPH NODE BIOPSY;  Surgeon: Stark Klein, MD;  Location: [place];  Service: General;  Laterality: Right;  . BREAST SURGERY     bx, no cancer   . CATARACT EXTRACTION Bilateral   . DILATATION & CURETTAGE/HYSTEROSCOPY WITH MYOSURE N/A 08/09/2017   Procedure: DILATATION & CURETTAGE/HYSTEROSCOPY WITH MYOSURE;  Surgeon: Emily Filbert, MD;  Location: [facility];  Service: Gynecology;  Laterality: N/A;  . LABIOPLASTY  03/06/2018   Procedure: LABIAPLASTY;  Surgeon: Emily Filbert, MD;  Location: [facility];  Service: Gynecology;;  . TOTAL KNEE ARTHROPLASTY Right 09/14/2020   Procedure: RIGHT TOTAL KNEE ARTHROPLASTY;  Surgeon: Leandrew Koyanagi, MD;  Location: [facility];  Service: Orthopedics;  Laterality: Right;  Marland Kitchen VAGINAL HYSTERECTOMY Bilateral 03/06/2018   Procedure: HYSTERECTOMY VAGINAL WITH BILATERAL SALPINGOOPHORECTOMY;  Surgeon: Emily Filbert, MD;  Location: [facility];  Service: Gynecology;  Laterality: Bilateral;    There were no vitals filed for this visit.   Subjective Assessment - 10/08/20 1639    Subjective "Getting better". Pt. reports doing well with HEP, no new complaints or concerns since eval.    Patient is accompained by: --   did not need interpreter-pt. able to speak English fluently enough  for session   Pertinent History Right breast CA 2019 s/p lumpectomy and radiation, brain CA, chronic bilat. LE edema, depression and anxiety, left knee OA, neuropathy    Currently in Pain? Yes    Pain Score 4     Pain Location Knee    Pain Orientation Right    Pain Descriptors / Indicators Burning;Stabbing    Pain Type Surgical pain    Pain Onset 1 to 4 weeks ago    Pain Frequency Intermittent    Aggravating Factors  bending knee, standing and walking    Pain Relieving Factors ice and medication    Effect of Pain on Daily Activities impacts standing and walking  tolerance              [facility] PT Assessment - 10/08/20 0001      AROM   Right Knee Extension 0    Right Knee Flexion 100                         [facility] Adult PT Treatment/Exercise - 10/08/20 0001      Transfers   Comments min assisted needed for sit<>supine to transition to/from supine exercises      Knee/Hip Exercises: Stretches   Other Knee/Hip Stretches step stretch right knee flexion x 10 reps 5 sec holds      Knee/Hip Exercises: Standing   Heel Raises Both;15 reps    Heel Raises Limitations on Airex    Hip Flexion Limitations alternating marches on Airex x 15 reps bilat. at counter    Terminal Knee Extension AROM;Strengthening;Right;1 set;15 reps    Theraband Level (Terminal Knee Extension) Level 3 (Green)    Hip Abduction AROM;Stengthening;Right;10 reps;1 set    Forward Step Up Right;1 set;10 reps;Hand Hold: 2;Step Height: 4"    Functional Squat Limitations partial squat at counter x 15 reps      Knee/Hip Exercises: Seated   Long Arc Quad AAROM;Right;2 sets;10 reps    Hamstring Curl AROM;Strengthening;Right;2 sets;10 reps    Hamstring Limitations green band, AAROM for LAQ with resisted hamstring curl on return      Knee/Hip Exercises: Supine   Short Arc Quad Sets AROM;Strengthening;Right;2 sets;10 reps    Heel Slides AAROM;Right;2 sets;10 reps      Manual Therapy   Manual Therapy Passive ROM;Joint mobilization    Joint Mobilization patellar mobilizations sup/inf glides grade I-III    Passive ROM right knee flexion in sitting                  PT Education - 10/08/20 1723    Education Details progress, exercises    Person(s) Educated Patient    Methods Verbal cues;Demonstration;Explanation    Comprehension Verbalized understanding;Returned demonstration            PT Short Term Goals - 10/01/20 2023      PT SHORT TERM GOAL #1   Title Independent with initial HEP    Baseline updated from home health therapy at eval    Time 3     Period Weeks    Status New      PT SHORT TERM GOAL #2   Title Increase right knee flexion AROM at least 10 deg to improve ability for stair navigation and tranfers from low seated    Baseline 90 deg    Time 3    Period Weeks    Status New      PT SHORT TERM GOAL #3   Title  Pt. to ambulate at least 100 feet mod I with SPC for progression to LTG for independent community level ambulation without AD    Baseline uses RW    Time 3    Period Weeks    Status New             PT Long Term Goals - 10/01/20 2025      PT LONG TERM GOAL #1   Title Increase right knee extension strength to at least 4/5 or greater to improve ability for transfers from low seats and to work towards stair navigation with reciprocal gait    Baseline 3-/5    Time 6    Period Weeks    Status New    Target Date 11/12/20      PT LONG TERM GOAL #2   Title Pt. to be able to perform independent household and short distance community ambulation for distances <500 feet without AD    Baseline uses RW, limited ambulation tolerance for community mobilty    Time 6    Period Weeks    Status New    Target Date 11/12/20      PT LONG TERM GOAL #3   Title Increase right knee flexion AROM to 105 deg or greater and knee extension within 2 deg of neutral to improve ability for stair navigation, transfers from low seats abd bending knee for chores/cleaning    Baseline 4-90 deg flexion    Time 6    Period Weeks    Status New    Target Date 11/12/20      PT LONG TERM GOAL #4   Title Tolerate standing for activities such as cooking and shopping for periods at least 30-40 min with right knee pain 3/10 or less    Time 6    Period Weeks    Status New    Target Date 11/12/20                 Plan - 10/08/20 1726    Clinical Impression Statement Pt. demonstraing improvement today with 10 deg gain in right knee flexion AROM from baseline. Able to add/progress standing, closed chain exercises as noted per flowsheet  with good tolerance. Progressing with gait goals with decreased need AD use at home. Pt. required min assist for sit<>supine due to weight of legs but was able to tolerate supine positoining today.    Personal Factors and Comorbidities Comorbidity 3+    Comorbidities LE edema, CA history, left knee OA, neuropathy, anxiety/depression    Examination-Activity Limitations Transfers;Carry;Dressing;Sleep;Locomotion Level;Stand;Stairs;Lift;Squat;Bathing;Bed Mobility;Toileting    Examination-Participation Restrictions Shop;Cleaning;Community Activity;Meal Prep    Stability/Clinical Decision Making Evolving/Moderate complexity    Clinical Decision Making Moderate    Rehab Potential Good    PT Frequency 2x / week    PT Duration 6 weeks    PT Treatment/Interventions ADLs/Self Care Home Management;Cryotherapy;Therapeutic activities;Therapeutic exercise;Neuromuscular re-education;Stair training;Gait training;Functional mobility training;Passive range of motion;Vasopneumatic Device;Patient/family education;Balance training    PT Next Visit Plan no estim due to CA history, continue/progress knee ROM and closed + open chain strengthening as tolerated    PT Home Exercise Plan Access code: [email or web address]    Consulted and Agree with Plan of Care Patient           Patient will benefit from skilled therapeutic intervention in order to improve the following deficits and impairments:  Abnormal gait,Difficulty walking,Decreased balance,Decreased mobility,Pain,Decreased strength,Decreased activity tolerance,Decreased range of motion  Visit Diagnosis: Right knee pain, unspecified chronicity  Stiffness of  right knee, not elsewhere classified  Difficulty in walking, not elsewhere classified  Muscle weakness (generalized)  Localized edema     Problem List Patient Active Problem List   Diagnosis Date Noted  . Status post total knee replacement, right 09/14/2020  . Primary osteoarthritis of right knee 09/13/2020   . Iron deficiency anemia 07/23/2018  . Breast cancer metastasized to axillary lymph node, right ([place]) 04/17/2018  . 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 Post-operative state 03/06/2018  . Uterine hyperplasia 10/11/2017  . Anxiety and depression 10/15/2014  . Vitamin B 12 deficiency 06/02/2014  . Vitamin D deficiency 06/02/2014  . Hereditary and idiopathic peripheral neuropathy 05/14/2014    Beaulah Dinning, PT, DPT 10/08/20 5:35 PM  [place], [place], [address] Phone: [phone]   Fax:  [phone]  Name: DEAN WONDER MRN: [id number] Date of Birth: 08/06/1951

## 2020-10-21 ENCOUNTER — Other Ambulatory Visit: Payer: Self-pay

## 2020-10-21 ENCOUNTER — Ambulatory Visit: Payer: Medicare Other

## 2020-10-21 DIAGNOSIS — M25561 Pain in right knee: Secondary | ICD-10-CM

## 2020-10-21 DIAGNOSIS — M25661 Stiffness of right knee, not elsewhere classified: Secondary | ICD-10-CM

## 2020-10-21 DIAGNOSIS — R262 Difficulty in walking, not elsewhere classified: Secondary | ICD-10-CM

## 2020-10-21 DIAGNOSIS — R600 Localized edema: Secondary | ICD-10-CM

## 2020-10-21 DIAGNOSIS — M6281 Muscle weakness (generalized): Secondary | ICD-10-CM

## 2020-10-21 NOTE — Therapy (Signed)
[place], [place], [address] Phone: [phone]   Fax:  [phone]  Physical Therapy Treatment  Patient Details  Name: Amy Roman MRN: [id number] Date of Birth: [birth year] Referring Provider (PT): Dwana Melena, [place]   Encounter Date: 10/21/2020   PT End of Session - 10/21/20 1740    Visit Number 3    Number of Visits 18    Date for PT Re-Evaluation 11/12/20    Authorization Type Medicare, progress note by visit 10    PT Start Time 1715    PT Stop Time 1755    PT Time Calculation (min) 40 min    Activity Tolerance Patient tolerated treatment well    Behavior During Therapy [facility] for tasks assessed/performed           Past Medical History:  Diagnosis Date   Anemia    Anxiety and depression 10/15/2014   Arthritis    BILATERAL KNEES, HAD CORTISONE INJECTION 03/2017   Brain cancer ([place])    Breast cancer ([place]) 2019   Right Breast Cancer   Depression    GERD (gastroesophageal reflux disease)    OCCASIONAL WITH CERTAIN FOOD   Headache    Heart murmur    History of radiation therapy 05/28/18- 07/10/18   Right Breast 25 fractions for a total dose of 50 Gy, Right SCV and PAB 25 fractions for a total dose of 50 Gy, right breast boost, 5 fractions for a total dose of 10 Gy.    Personal history of radiation therapy    Right Breast Cancer   Seasonal allergies    SVD (spontaneous vaginal delivery)    x 4   Unspecified hereditary and idiopathic peripheral neuropathy 05/14/2014   LEFT KNEE AND FOOT    Past Surgical History:  Procedure Laterality Date   ABDOMINAL HYSTERECTOMY     AXILLARY LYMPH NODE DISSECTION Right 04/17/2018   Procedure: AXILLARY LYMPH NODE DISSECTION;  Surgeon: Stark Klein, MD;  Location: [place];  Service: General;  Laterality: Right;   BREAST EXCISIONAL BIOPSY Right 2007   BREAST LUMPECTOMY Right 2019   BREAST LUMPECTOMY WITH  RADIOACTIVE SEED AND SENTINEL LYMPH NODE BIOPSY Right 04/05/2018   Procedure: RIGHT BREAST SEED BRACKETED LUMPECTOMY WITH SENTINEL LYMPH NODE BIOPSY;  Surgeon: Stark Klein, MD;  Location: [place];  Service: General;  Laterality: Right;   BREAST SURGERY     bx, no cancer    CATARACT EXTRACTION Bilateral    DILATATION & CURETTAGE/HYSTEROSCOPY WITH MYOSURE N/A 08/09/2017   Procedure: [place];  Surgeon: Emily Filbert, MD;  Location: [facility];  Service: Gynecology;  Laterality: N/A;   LABIOPLASTY  03/06/2018   Procedure: LABIAPLASTY;  Surgeon: Emily Filbert, MD;  Location: [facility];  Service: Gynecology;;   TOTAL KNEE ARTHROPLASTY Right 09/14/2020   Procedure: RIGHT TOTAL KNEE ARTHROPLASTY;  Surgeon: Leandrew Koyanagi, MD;  Location: [place];  Service: Orthopedics;  Laterality: Right;   VAGINAL HYSTERECTOMY Bilateral 03/06/2018   Procedure: HYSTERECTOMY VAGINAL WITH BILATERAL SALPINGOOPHORECTOMY;  Surgeon: Emily Filbert, MD;  Location: [facility];  Service: Gynecology;  Laterality: Bilateral;    There were no vitals filed for this visit.   Subjective Assessment - 10/21/20 1714    Subjective I've been doing my exercises everyday and my knee is feeling much better. She reports the most pain when laying in bed at night.    Patient is accompained by: --   did  not need interpreter-pt. able to speak English fluently enough for session   Pertinent History Right breast CA 2019 s/p lumpectomy and radiation, brain CA, chronic bilat. LE edema, depression and anxiety, left knee OA, neuropathy    Currently in Pain? Yes    Pain Score 3     Pain Location Knee    Pain Orientation Right    Pain Descriptors / Indicators Throbbing    Pain Type Surgical pain    Pain Onset 1 to 4 weeks ago              [facility] PT Assessment - 10/21/20 0001      Observation/Other Assessments   Observations Incision site well healed      AROM   Right Knee Extension 0    Right  Knee Flexion 115                         [facility] Adult PT Treatment/Exercise - 10/21/20 0001      Knee/Hip Exercises: Standing   Heel Raises Limitations 2 x 10    Hip Flexion Limitations alternating march 2 x 10    Abduction Limitations 2 x 10    Extension Limitations 2 x 10    Other Standing Knee Exercises semi tandem 2 x 30 sec each; rhomberg EC 3 x 30 sec      Knee/Hip Exercises: Seated   Long Arc Quad Limitations 2 x 10      Knee/Hip Exercises: Supine   Straight Leg Raises Limitations attempted      Manual Therapy   Joint Mobilization patellar mobilizations grade II-III superior and inferior    Passive ROM right knee flexion PROM                    PT Short Term Goals - 10/01/20 2023      PT SHORT TERM GOAL #1   Title Independent with initial HEP    Baseline updated from home health therapy at eval    Time 3    Period Weeks    Status New      PT SHORT TERM GOAL #2   Title Increase right knee flexion AROM at least 10 deg to improve ability for stair navigation and tranfers from low seated    Baseline 90 deg    Time 3    Period Weeks    Status New      PT SHORT TERM GOAL #3   Title Pt. to ambulate at least 100 feet mod I with SPC for progression to LTG for independent community level ambulation without AD    Baseline uses RW    Time 3    Period Weeks    Status New             PT Long Term Goals - 10/01/20 2025      PT LONG TERM GOAL #1   Title Increase right knee extension strength to at least 4/5 or greater to improve ability for transfers from low seats and to work towards stair navigation with reciprocal gait    Baseline 3-/5    Time 6    Period Weeks    Status New    Target Date 11/12/20      PT LONG TERM GOAL #2   Title Pt. to be able to perform independent household and short distance community ambulation for distances <500 feet without AD    Baseline uses RW, limited ambulation tolerance for  community mobilty    Time 6     Period Weeks    Status New    Target Date 11/12/20      PT LONG TERM GOAL #3   Title Increase right knee flexion AROM to 105 deg or greater and knee extension within 2 deg of neutral to improve ability for stair navigation, transfers from low seats abd bending knee for chores/cleaning    Baseline 4-90 deg flexion    Time 6    Period Weeks    Status New    Target Date 11/12/20      PT LONG TERM GOAL #4   Title Tolerate standing for activities such as cooking and shopping for periods at least 30-40 min with right knee pain 3/10 or less    Time 6    Period Weeks    Status New    Target Date 11/12/20                 Plan - 10/21/20 1741    Clinical Impression Statement Patient is making gradual progress in Rt knee flexion AROM achieving 115 degrees during today's session. Patient tolerates standing exercises well, though reports occasional pain in the Lt knee secondary to OA. Patient unable to complete SLR on the RLE, though likely due to weight of leg as opposed to strength deficits. Patient demonstrates significant trunk lean/pelvic drop during SL activity on the RLE signifying continued need to focus on hip strength.    Personal Factors and Comorbidities Comorbidity 3+    Comorbidities LE edema, CA history, left knee OA, neuropathy, anxiety/depression    Examination-Activity Limitations Transfers;Carry;Dressing;Sleep;Locomotion Level;Stand;Stairs;Lift;Squat;Bathing;Bed Mobility;Toileting    Examination-Participation Restrictions Shop;Cleaning;Community Activity;Meal Prep    Stability/Clinical Decision Making Evolving/Moderate complexity    Rehab Potential Good    PT Frequency 2x / week    PT Duration 6 weeks    PT Treatment/Interventions ADLs/Self Care Home Management;Cryotherapy;Therapeutic activities;Therapeutic exercise;Neuromuscular re-education;Stair training;Gait training;Functional mobility training;Passive range of motion;Vasopneumatic Device;Patient/family  education;Balance training    PT Next Visit Plan no estim due to CA history, continue knee ROM. Consider Nustep. Progress standing activity as tolerated. Progress static balance. Gait training. Update HEP.    PT Home Exercise Plan Access code: [email or web address]    Consulted and Agree with Plan of Care Patient           Patient will benefit from skilled therapeutic intervention in order to improve the following deficits and impairments:  Abnormal gait,Difficulty walking,Decreased balance,Decreased mobility,Pain,Decreased strength,Decreased activity tolerance,Decreased range of motion  Visit Diagnosis: Right knee pain, unspecified chronicity  Stiffness of right knee, not elsewhere classified  Difficulty in walking, not elsewhere classified  Muscle weakness (generalized)  Localized edema     Problem List Patient Active Problem List   Diagnosis Date Noted   Status post total knee replacement, right 09/14/2020   Primary osteoarthritis of right knee 09/13/2020   Iron deficiency anemia 07/23/2018   Breast cancer metastasized to axillary lymph node, right (HCC) 04/17/2018   Malignant neoplasm of 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, estrogen receptor positive (HCC) 03/11/2018   Post-operative state 03/06/2018   Uterine hyperplasia 10/11/2017   Anxiety and depression 10/15/2014   Vitamin B 12 deficiency 06/02/2014   Vitamin D deficiency 06/02/2014   Hereditary and idiopathic peripheral neuropathy 05/14/2014   Letitia Libra, PT, DPT, ATC 10/21/20 5:59 PM  [facility] [address] [place], [place], [address] Phone: [phone]   Fax:  [phone]  Name: Neily Gabler  [name]-Hussein MRN: [id number] Date of Birth: 19-Nov-1950

## 2020-10-27 ENCOUNTER — Ambulatory Visit (INDEPENDENT_AMBULATORY_CARE_PROVIDER_SITE_OTHER): Payer: Medicare Other

## 2020-10-27 ENCOUNTER — Other Ambulatory Visit: Payer: Self-pay

## 2020-10-27 ENCOUNTER — Ambulatory Visit (INDEPENDENT_AMBULATORY_CARE_PROVIDER_SITE_OTHER): Payer: Medicare Other | Admitting: Orthopaedic Surgery

## 2020-10-27 ENCOUNTER — Encounter: Payer: Self-pay | Admitting: Orthopaedic Surgery

## 2020-10-27 DIAGNOSIS — Z96651 Presence of right artificial knee joint: Secondary | ICD-10-CM

## 2020-10-27 MED ORDER — PROMETHAZINE HCL 25 MG PO TABS: 25.0000 mg | ORAL_TABLET | Freq: Four times a day (QID) | ORAL | 1 refills | Status: DC | PRN

## 2020-10-27 MED ORDER — GABAPENTIN 300 MG PO CAPS: 300.0000 mg | ORAL_CAPSULE | Freq: Three times a day (TID) | ORAL | 3 refills | Status: AC

## 2020-10-27 NOTE — Progress Notes (Signed)
Post-Op Visit Note   Patient: Amy Roman           Date of Birth: 1951/09/26           MRN: [id number] Visit Date: 10/27/2020 PCP: Lin Landsman, MD   Assessment & Plan:  Chief Complaint:  Chief Complaint  Patient presents with  . Right Knee - Routine Post Op   Visit Diagnoses:  1. History of total knee replacement, right     Plan:   Patient is 6-week status post right total knee replacement.  Overall she is doing well and reports no pain.  She states that she continues to have occasional nausea.  She would like a refill on gabapentin.  She is currently in outpatient PT.  Overall she has no complaints and is pleased.  Surgical scar is fully healed.  Her range of motion has returned back to baseline.  She is ambulating with a normal gait.  Strength is improving.  X-rays demonstrate stable total knee replacement without complications.  Patient is doing well for her 6-week visit.  She will continue physical therapy.  Dental prophylaxis reinforced.  We will see her back in another 6 weeks with two-view x-rays of the right knee.  We will make a referral to health and wellness clinic to help her lose some weight and hopefully improve the subcutaneous tissue problem with her left leg so that can be more conducive to surgery and post as less of a risk for infection and wound problems.  Follow-Up Instructions: Return in about 6 weeks (around 12/08/2020).   Orders:  Orders Placed This Encounter  Procedures  . XR Knee 1-2 Views Right  . Ambulatory referral to [facility] ordered this encounter  Medications  . promethazine (PHENERGAN) 25 MG tablet    Sig: Take 1 tablet (25 mg total) by mouth every 6 (six) hours as needed for nausea.    Dispense:  30 tablet    Refill:  1  . gabapentin (NEURONTIN) 300 MG capsule    Sig: Take 1 capsule (300 mg total) by mouth 3 (three) times daily.    Dispense:  90 capsule    Refill:  3    Imaging: XR Knee 1-2 Views  Right  Result Date: 10/27/2020 Stable total knee replacement in good alignment    PMFS History: Patient Active Problem List   Diagnosis Date Noted  . Status post total knee replacement, right 09/14/2020  . Primary osteoarthritis of right knee 09/13/2020  . Iron deficiency anemia 07/23/2018  . Breast cancer metastasized to axillary lymph node, right ([place]) 04/17/2018  . Malignant neoplasm of 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, estrogen receptor positive ([place]) 03/11/2018  . Post-operative state 03/06/2018  . Uterine hyperplasia 10/11/2017  . Anxiety and depression 10/15/2014  . Vitamin B 12 deficiency 06/02/2014  . Vitamin D deficiency 06/02/2014  . Hereditary and idiopathic peripheral neuropathy 05/14/2014   Past Medical History:  Diagnosis Date  . Anemia   . Anxiety and depression 10/15/2014  . Arthritis    BILATERAL KNEES, HAD CORTISONE INJECTION 03/2017  . Brain cancer ([place])   . Breast cancer ([place]) 2019   Right Breast Cancer  . Depression   . GERD (gastroesophageal reflux disease)    OCCASIONAL WITH CERTAIN FOOD  . Headache   . Heart murmur   . History of radiation therapy 05/28/18- 07/10/18   Right Breast 25 fractions for a total dose of 50 Gy, Right SCV and PAB 25  fractions for a total dose of 50 Gy, right breast boost, 5 fractions for a total dose of 10 Gy.   . Personal history of radiation therapy    Right Breast Cancer  . Seasonal allergies   . SVD (spontaneous vaginal delivery)    x 4  . Unspecified hereditary and idiopathic peripheral neuropathy 05/14/2014   LEFT KNEE AND FOOT    Family History  Problem Relation Age of Onset  . Diabetes Mother   . Neuropathy Mother   . Neuropathy Sister   . Cancer Sister        unknown type cancer in neck   . Other Father   . Cancer Maternal Aunt        leukemia   . Colon cancer Neg Hx   . Breast cancer Neg Hx     Past Surgical History:  Procedure Laterality Date  . ABDOMINAL HYSTERECTOMY    . AXILLARY LYMPH  NODE DISSECTION Right 04/17/2018   Procedure: AXILLARY LYMPH NODE DISSECTION;  Surgeon: Almond Lint, MD;  Location: [facility];  Service: General;  Laterality: Right;  . BREAST EXCISIONAL BIOPSY Right 2007  . BREAST LUMPECTOMY Right 2019  . BREAST LUMPECTOMY WITH RADIOACTIVE SEED AND SENTINEL LYMPH NODE BIOPSY Right 04/05/2018   Procedure: RIGHT BREAST SEED BRACKETED LUMPECTOMY WITH SENTINEL LYMPH NODE BIOPSY;  Surgeon: Almond Lint, MD;  Location: [facility];  Service: General;  Laterality: Right;  . BREAST SURGERY     bx, no cancer   . CATARACT EXTRACTION Bilateral   . DILATATION & CURETTAGE/HYSTEROSCOPY WITH MYOSURE N/A 08/09/2017   Procedure: DILATATION & CURETTAGE/HYSTEROSCOPY WITH MYOSURE;  Surgeon: Allie Bossier, MD;  Location: [facility];  Service: Gynecology;  Laterality: N/A;  . LABIOPLASTY  03/06/2018   Procedure: LABIAPLASTY;  Surgeon: Allie Bossier, MD;  Location: [facility];  Service: Gynecology;;  . TOTAL KNEE ARTHROPLASTY Right 09/14/2020   Procedure: RIGHT TOTAL KNEE ARTHROPLASTY;  Surgeon: Tarry Kos, MD;  [facility];  Service: Orthopedics;  Laterality: Right;  Marland Kitchen VAGINAL HYSTERECTOMY Bilateral 03/06/2018   Procedure: HYSTERECTOMY VAGINAL WITH BILATERAL SALPINGOOPHORECTOMY;  Surgeon: Allie Bossier, MD;  Location: [facility];  Service: Gynecology;  Laterality: Bilateral;   Social History   Occupational History  . Occupation: house   Tobacco Use  . Smoking status: Never Smoker  . Smokeless tobacco: Never Used  Vaping Use  . Vaping Use: Never used  Substance and Sexual Activity  . Alcohol use: No  . Drug use: No  . Sexual activity: Not on file

## 2020-10-28 ENCOUNTER — Ambulatory Visit: Payer: Medicare Other | Attending: Physician Assistant

## 2020-10-28 ENCOUNTER — Telehealth: Payer: Self-pay

## 2020-10-28 DIAGNOSIS — M25561 Pain in right knee: Secondary | ICD-10-CM | POA: Insufficient documentation

## 2020-10-28 DIAGNOSIS — M25661 Stiffness of right knee, not elsewhere classified: Secondary | ICD-10-CM | POA: Insufficient documentation

## 2020-10-28 DIAGNOSIS — M6281 Muscle weakness (generalized): Secondary | ICD-10-CM | POA: Insufficient documentation

## 2020-10-28 DIAGNOSIS — R600 Localized edema: Secondary | ICD-10-CM | POA: Insufficient documentation

## 2020-10-28 DIAGNOSIS — R262 Difficulty in walking, not elsewhere classified: Secondary | ICD-10-CM | POA: Insufficient documentation

## 2020-10-28 NOTE — Telephone Encounter (Signed)
Left voicemail informing patient of no-show appointment and reminded her of next scheduled visit.

## 2020-10-29 ENCOUNTER — Ambulatory Visit: Payer: Medicare Other

## 2020-10-29 ENCOUNTER — Other Ambulatory Visit: Payer: Self-pay

## 2020-10-29 DIAGNOSIS — M25661 Stiffness of right knee, not elsewhere classified: Secondary | ICD-10-CM | POA: Diagnosis present

## 2020-10-29 DIAGNOSIS — R600 Localized edema: Secondary | ICD-10-CM | POA: Diagnosis present

## 2020-10-29 DIAGNOSIS — M6281 Muscle weakness (generalized): Secondary | ICD-10-CM | POA: Diagnosis present

## 2020-10-29 DIAGNOSIS — R262 Difficulty in walking, not elsewhere classified: Secondary | ICD-10-CM

## 2020-10-29 DIAGNOSIS — M25561 Pain in right knee: Secondary | ICD-10-CM

## 2020-10-29 NOTE — Therapy (Signed)
[place], [place], [address] Phone: [phone]   Fax:  [phone]  Physical Therapy Treatment  Patient Details  Name: Amy Roman MRN: [id number] Date of Birth: Mar 07, 1951 Referring Provider (PT): Dwana Melena, [place]   Encounter Date: 10/29/2020   PT End of Session - 10/29/20 1745    Visit Number 4    Number of Visits 18    Date for PT Re-Evaluation 11/12/20    Authorization Type Medicare, progress note by visit 10    PT Start Time 1717    PT Stop Time 1800    PT Time Calculation (min) 43 min    Activity Tolerance Patient tolerated treatment well    Behavior During Therapy [facility] for tasks assessed/performed           Past Medical History:  Diagnosis Date  . Anemia   . Anxiety and depression 10/15/2014  . Arthritis    BILATERAL KNEES, HAD CORTISONE INJECTION 03/2017  . Brain cancer ([place])   . Breast cancer ([place]) 2019   Right Breast Cancer  . Depression   . GERD (gastroesophageal reflux disease)    OCCASIONAL WITH CERTAIN FOOD  . Headache   . Heart murmur   . History of radiation therapy 05/28/18- 07/10/18   Right Breast 25 fractions for a total dose of 50 Gy, Right SCV and PAB 25 fractions for a total dose of 50 Gy, right breast boost, 5 fractions for a total dose of 10 Gy.   . Personal history of radiation therapy    Right Breast Cancer  . Seasonal allergies   . SVD (spontaneous vaginal delivery)    x 4  . Unspecified hereditary and idiopathic peripheral neuropathy 05/14/2014   LEFT KNEE AND FOOT    Past Surgical History:  Procedure Laterality Date  . ABDOMINAL HYSTERECTOMY    . AXILLARY LYMPH NODE DISSECTION Right 04/17/2018   Procedure: AXILLARY LYMPH NODE DISSECTION;  Surgeon: Stark Klein, MD;  Location: [place];  Service: General;  Laterality: Right;  . BREAST EXCISIONAL BIOPSY Right 2007  . BREAST LUMPECTOMY Right 2019  . BREAST LUMPECTOMY WITH RADIOACTIVE  SEED AND SENTINEL LYMPH NODE BIOPSY Right 04/05/2018   Procedure: RIGHT BREAST SEED BRACKETED LUMPECTOMY WITH SENTINEL LYMPH NODE BIOPSY;  Surgeon: Stark Klein, MD;  Location: [place];  Service: General;  Laterality: Right;  . BREAST SURGERY     bx, no cancer   . CATARACT EXTRACTION Bilateral   . DILATATION & CURETTAGE/HYSTEROSCOPY WITH MYOSURE N/A 08/09/2017   Procedure: DILATATION & CURETTAGE/HYSTEROSCOPY WITH MYOSURE;  Surgeon: Emily Filbert, MD;  Location: [facility];  Service: Gynecology;  Laterality: N/A;  . LABIOPLASTY  03/06/2018   Procedure: LABIAPLASTY;  Surgeon: Emily Filbert, MD;  Location: [facility];  Service: Gynecology;;  . TOTAL KNEE ARTHROPLASTY Right 09/14/2020   Procedure: RIGHT TOTAL KNEE ARTHROPLASTY;  Surgeon: Leandrew Koyanagi, MD;  Location: [place];  Service: Orthopedics;  Laterality: Right;  Marland Kitchen VAGINAL HYSTERECTOMY Bilateral 03/06/2018   Procedure: HYSTERECTOMY VAGINAL WITH BILATERAL SALPINGOOPHORECTOMY;  Surgeon: Emily Filbert, MD;  Location: [facility];  Service: Gynecology;  Laterality: Bilateral;    There were no vitals filed for this visit.   Subjective Assessment - 10/29/20 1725    Subjective Patient had f/u with physician yesterday who is pleased with overall progress thus far. She denies any pain currently or soreness after last session. She reports compliance with HEP.    Pertinent History Right  breast CA 2019 s/p lumpectomy and radiation, brain CA, chronic bilat. LE edema, depression and anxiety, left knee OA, neuropathy    Currently in Pain? No/denies              [facility] PT Assessment - 10/29/20 0001      AROM   Right Knee Flexion 123                         [facility] Adult PT Treatment/Exercise - 10/29/20 0001      Knee/Hip Exercises: Aerobic   Nustep level 6 5 minutes UE/LE      Knee/Hip Exercises: Standing   Heel Raises Limitations 2 x 10    Extension Limitations 2 x 10      Knee/Hip Exercises: Seated   Long Arc Quad Limitations  2 x 10; 2lb    Marching Limitations 2 x 20; 2lb      Manual Therapy   Manual therapy comments scar tissue mobilization    Passive ROM rt knee flexion PROM                    PT Short Term Goals - 10/01/20 2023      PT SHORT TERM GOAL #1   Title Independent with initial HEP    Baseline updated from home health therapy at eval    Time 3    Period Weeks    Status New      PT SHORT TERM GOAL #2   Title Increase right knee flexion AROM at least 10 deg to improve ability for stair navigation and tranfers from low seated    Baseline 90 deg    Time 3    Period Weeks    Status New      PT SHORT TERM GOAL #3   Title Pt. to ambulate at least 100 feet mod I with SPC for progression to LTG for independent community level ambulation without AD    Baseline uses RW    Time 3    Period Weeks    Status New             PT Long Term Goals - 10/01/20 2025      PT LONG TERM GOAL #1   Title Increase right knee extension strength to at least 4/5 or greater to improve ability for transfers from low seats and to work towards stair navigation with reciprocal gait    Baseline 3-/5    Time 6    Period Weeks    Status New    Target Date 11/12/20      PT LONG TERM GOAL #2   Title Pt. to be able to perform independent household and short distance community ambulation for distances <500 feet without AD    Baseline uses RW, limited ambulation tolerance for community mobilty    Time 6    Period Weeks    Status New    Target Date 11/12/20      PT LONG TERM GOAL #3   Title Increase right knee flexion AROM to 105 deg or greater and knee extension within 2 deg of neutral to improve ability for stair navigation, transfers from low seats abd bending knee for chores/cleaning    Baseline 4-90 deg flexion    Time 6    Period Weeks    Status New    Target Date 11/12/20      PT LONG TERM GOAL #4   Title Tolerate standing  for activities such as cooking and shopping for periods at least  30-40 min with right knee pain 3/10 or less    Time 6    Period Weeks    Status New    Target Date 11/12/20                 Plan - 10/29/20 1746    Clinical Impression Statement Patient's knee flexion AROM is gradually improving achieving functional ROM at today's session. She is tolerating gradual progression of hip/knee strengthening well without reports of pain, though requires cues for pacing of exercises and decrease compensatory trunk movement with ability to correct once cued.    Personal Factors and Comorbidities Comorbidity 3+    Comorbidities LE edema, CA history, left knee OA, neuropathy, anxiety/depression    Examination-Activity Limitations Transfers;Carry;Dressing;Sleep;Locomotion Level;Stand;Stairs;Lift;Squat;Bathing;Bed Mobility;Toileting    Examination-Participation Restrictions Shop;Cleaning;Community Activity;Meal Prep    Stability/Clinical Decision Making Evolving/Moderate complexity    Rehab Potential Good    PT Frequency 2x / week    PT Duration 6 weeks    PT Treatment/Interventions ADLs/Self Care Home Management;Cryotherapy;Therapeutic activities;Therapeutic exercise;Neuromuscular re-education;Stair training;Gait training;Functional mobility training;Passive range of motion;Vasopneumatic Device;Patient/family education;Balance training    PT Next Visit Plan no estim due to CA history, continue knee ROM. Consider Nustep. Progress standing activity as tolerated. Progress static balance. Gait training. Update HEP.    PT Home Exercise Plan Access code: [email or web address]    Consulted and Agree with Plan of Care Patient           Patient will benefit from skilled therapeutic intervention in order to improve the following deficits and impairments:  Abnormal gait,Difficulty walking,Decreased balance,Decreased mobility,Pain,Decreased strength,Decreased activity tolerance,Decreased range of motion  Visit Diagnosis: Right knee pain, unspecified chronicity  Stiffness of right  knee, not elsewhere classified  Difficulty in walking, not elsewhere classified  Muscle weakness (generalized)     Problem List Patient Active Problem List   Diagnosis Date Noted  . Status post total knee replacement, right 09/14/2020  . Primary osteoarthritis of right knee 09/13/2020  . Iron deficiency anemia 07/23/2018  . Breast cancer metastasized to axillary lymph node, right ([place]) 04/17/2018  . Malignant neoplasm of 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, estrogen receptor positive ([place]) 03/11/2018  . Post-operative state 03/06/2018  . Uterine hyperplasia 10/11/2017  . Anxiety and depression 10/15/2014  . Vitamin B 12 deficiency 06/02/2014  . Vitamin D deficiency 06/02/2014  . Hereditary and idiopathic peripheral neuropathy 05/14/2014   Gwendolyn Grant, PT, DPT, ATC 10/29/20 6:01 PM  [facility] [address] [place], [place], [address] Phone: [phone]   Fax:  [phone]  Name: Amy Roman MRN: [id number] Date of Birth: November 25, 1950

## 2020-11-03 ENCOUNTER — Ambulatory Visit: Payer: Medicare Other

## 2020-11-05 ENCOUNTER — Other Ambulatory Visit: Payer: Self-pay

## 2020-11-05 ENCOUNTER — Ambulatory Visit: Payer: Medicare Other

## 2020-11-05 DIAGNOSIS — M6281 Muscle weakness (generalized): Secondary | ICD-10-CM

## 2020-11-05 DIAGNOSIS — M25561 Pain in right knee: Secondary | ICD-10-CM | POA: Diagnosis not present

## 2020-11-05 DIAGNOSIS — M25661 Stiffness of right knee, not elsewhere classified: Secondary | ICD-10-CM

## 2020-11-05 DIAGNOSIS — R262 Difficulty in walking, not elsewhere classified: Secondary | ICD-10-CM

## 2020-11-05 DIAGNOSIS — R600 Localized edema: Secondary | ICD-10-CM

## 2020-11-05 NOTE — Therapy (Signed)
[place], [place], [address] Phone: [phone]   Fax:  [phone]  Physical Therapy Treatment  Patient Details  Name: Amy Roman MRN: [id number] Date of Birth: 1951-03-27 Referring Provider (PT): Dwana Melena, [place]   Encounter Date: 11/05/2020   PT End of Session - 11/05/20 1723    Visit Number 5    Number of Visits 18    Date for PT Re-Evaluation 11/12/20    Authorization Type Medicare, progress note by visit 10    PT Start Time 1715    PT Stop Time 1758    PT Time Calculation (min) 43 min    Activity Tolerance Patient tolerated treatment well    Behavior During Therapy [facility] for tasks assessed/performed           Past Medical History:  Diagnosis Date  . Anemia   . Anxiety and depression 10/15/2014  . Arthritis    BILATERAL KNEES, HAD CORTISONE INJECTION 03/2017  . Brain cancer ([place])   . Breast cancer ([place]) 2019   Right Breast Cancer  . Depression   . GERD (gastroesophageal reflux disease)    OCCASIONAL WITH CERTAIN FOOD  . Headache   . Heart murmur   . History of radiation therapy 05/28/18- 07/10/18   Right Breast 25 fractions for a total dose of 50 Gy, Right SCV and PAB 25 fractions for a total dose of 50 Gy, right breast boost, 5 fractions for a total dose of 10 Gy.   . Personal history of radiation therapy    Right Breast Cancer  . Seasonal allergies   . SVD (spontaneous vaginal delivery)    x 4  . Unspecified hereditary and idiopathic peripheral neuropathy 05/14/2014   LEFT KNEE AND FOOT    Past Surgical History:  Procedure Laterality Date  . ABDOMINAL HYSTERECTOMY    . AXILLARY LYMPH NODE DISSECTION Right 04/17/2018   Procedure: AXILLARY LYMPH NODE DISSECTION;  Surgeon: Stark Klein, MD;  Location: [facility];  Service: General;  Laterality: Right;  . BREAST EXCISIONAL BIOPSY Right 2007  . BREAST LUMPECTOMY Right 2019  . BREAST LUMPECTOMY WITH RADIOACTIVE  SEED AND SENTINEL LYMPH NODE BIOPSY Right 04/05/2018   Procedure: RIGHT BREAST SEED BRACKETED LUMPECTOMY WITH SENTINEL LYMPH NODE BIOPSY;  Surgeon: Stark Klein, MD;  Location: [facility];  Service: General;  Laterality: Right;  . BREAST SURGERY     bx, no cancer   . CATARACT EXTRACTION Bilateral   . DILATATION & CURETTAGE/HYSTEROSCOPY WITH MYOSURE N/A 08/09/2017   Procedure: DILATATION & CURETTAGE/HYSTEROSCOPY WITH MYOSURE;  Surgeon: Emily Filbert, MD;  Location: [facility];  Service: Gynecology;  Laterality: N/A;  . LABIOPLASTY  03/06/2018   Procedure: LABIAPLASTY;  Surgeon: Emily Filbert, MD;  Location: [facility];  Service: Gynecology;;  . TOTAL KNEE ARTHROPLASTY Right 09/14/2020   Procedure: RIGHT TOTAL KNEE ARTHROPLASTY;  Surgeon: Leandrew Koyanagi, MD;  Location: [facility];  Service: Orthopedics;  Laterality: Right;  Marland Kitchen VAGINAL HYSTERECTOMY Bilateral 03/06/2018   Procedure: HYSTERECTOMY VAGINAL WITH BILATERAL SALPINGOOPHORECTOMY;  Surgeon: Emily Filbert, MD;  Location: [facility];  Service: Gynecology;  Laterality: Bilateral;    There were no vitals filed for this visit.   Subjective Assessment - 11/05/20 1721    Subjective Patient reports her knee has been feeling fine, but sometimes she feels no energy/lack of appetite that has been intermittent ever since her surgery, which she has discussed with her surgeon.    Pertinent  History Right breast CA 2019 s/p lumpectomy and radiation, brain CA, chronic bilat. LE edema, depression and anxiety, left knee OA, neuropathy    Currently in Pain? No/denies              [facility] PT Assessment - 11/05/20 0001      AROM   Right Knee Extension 0    Right Knee Flexion 130                         [facility] Adult PT Treatment/Exercise - 11/05/20 0001      Self-Care   Self-Care Other Self-Care Comments    Other Self-Care Comments  see patient education      Knee/Hip Exercises: Aerobic   Nustep level 6 5 minutes UE/LE      Knee/Hip  Exercises: Standing   Other Standing Knee Exercises lateral step over airex 2 x 10    Other Standing Knee Exercises rhomberg on foam EO 1 x 30 sec, EC 2 x 30 sec      Knee/Hip Exercises: Seated   Long Arc Quad Limitations 2 x 15; 3lb    Marching Limitations 2 x 20; 2lb      Knee/Hip Exercises: Supine   Bridges Limitations 2 x 10      Manual Therapy   Manual therapy comments scar tissue mobilization    Passive ROM rt knee flexion PROM                    PT Short Term Goals - 11/05/20 1826      PT SHORT TERM GOAL #1   Title Independent with initial HEP    Baseline updated from home health therapy at eval    Time 3    Period Weeks    Status Achieved      PT SHORT TERM GOAL #2   Title Increase right knee flexion AROM at least 10 deg to improve ability for stair navigation and tranfers from low seated    Baseline 90 deg    Time 3    Period Weeks    Status Achieved      PT SHORT TERM GOAL #3   Title Pt. to ambulate at least 100 feet mod I with SPC for progression to LTG for independent community level ambulation without AD    Baseline uses RW    Time 3    Period Weeks    Status Achieved             PT Long Term Goals - 10/01/20 2025      PT LONG TERM GOAL #1   Title Increase right knee extension strength to at least 4/5 or greater to improve ability for transfers from low seats and to work towards stair navigation with reciprocal gait    Baseline 3-/5    Time 6    Period Weeks    Status New    Target Date 11/12/20      PT LONG TERM GOAL #2   Title Pt. to be able to perform independent household and short distance community ambulation for distances <500 feet without AD    Baseline uses RW, limited ambulation tolerance for community mobilty    Time 6    Period Weeks    Status New    Target Date 11/12/20      PT LONG TERM GOAL #3   Title Increase right knee flexion AROM to 105 deg or greater and knee extension  within 2 deg of neutral to improve ability  for stair navigation, transfers from low seats abd bending knee for chores/cleaning    Baseline 4-90 deg flexion    Time 6    Period Weeks    Status New    Target Date 11/12/20      PT LONG TERM GOAL #4   Title Tolerate standing for activities such as cooking and shopping for periods at least 30-40 min with right knee pain 3/10 or less    Time 6    Period Weeks    Status New    Target Date 11/12/20                 Plan - 11/05/20 1756    Clinical Impression Statement Patient has achieved full, pain free knee flexion AROM and continues to tolerate progression of strengthening and balance activity well without reports of knee pain. Patient is able to ambulate without use of assistive device, though has significant lateral trunk lean during stance phase bilaterally.    Personal Factors and Comorbidities Comorbidity 3+    Comorbidities LE edema, CA history, left knee OA, neuropathy, anxiety/depression    Examination-Activity Limitations Transfers;Carry;Dressing;Sleep;Locomotion Level;Stand;Stairs;Lift;Squat;Bathing;Bed Mobility;Toileting    Examination-Participation Restrictions Shop;Cleaning;Community Activity;Meal Prep    Stability/Clinical Decision Making Evolving/Moderate complexity    Rehab Potential Good    PT Frequency 2x / week    PT Duration 6 weeks    PT Treatment/Interventions ADLs/Self Care Home Management;Cryotherapy;Therapeutic activities;Therapeutic exercise;Neuromuscular re-education;Stair training;Gait training;Functional mobility training;Passive range of motion;Vasopneumatic Device;Patient/family education;Balance training    PT Next Visit Plan no estim due to CA history, continue knee ROM. Consider Nustep. Progress standing activity as tolerated. Progress static balance. Gait training.    PT Home Exercise Plan Access code: [email or web address]    Consulted and Agree with Plan of Care Patient           Patient will benefit from skilled therapeutic intervention in order to  improve the following deficits and impairments:  Abnormal gait,Difficulty walking,Decreased balance,Decreased mobility,Pain,Decreased strength,Decreased activity tolerance,Decreased range of motion  Visit Diagnosis: Right knee pain, unspecified chronicity  Stiffness of right knee, not elsewhere classified  Difficulty in walking, not elsewhere classified  Muscle weakness (generalized)  Localized edema     Problem List Patient Active Problem List   Diagnosis Date Noted  . Status post total knee replacement, right 09/14/2020  . Primary osteoarthritis of right knee 09/13/2020  . Iron deficiency anemia 07/23/2018  . Breast cancer metastasized to axillary lymph node, right (H[facility]) 04/17/2018  . Malignant neoplasm of 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, estrogen receptor positive (HCC) 03/11/2018  . Post-operative state 03/06/2018  . Uterine hyperplasia 10/11/2017  . Anxiety and depression 10/15/2014  . Vitamin B 12 deficiency 06/02/2014  . Vitamin D deficiency 06/02/2014  . Hereditary and idiopathic peripheral neuropathy 05/14/2014   Letitia Libra, PT, DPT, ATC 11/05/20 6:37 PM  [facility] [address] [place], [place], [address] Phone: [phone]   Fax:  [phone]  Name: DYMOND GUTT MRN: [id number] Date of Birth: [birth year]

## 2020-11-10 ENCOUNTER — Ambulatory Visit: Payer: Medicare Other

## 2020-11-12 ENCOUNTER — Ambulatory Visit: Payer: Medicare Other

## 2020-11-15 NOTE — Progress Notes (Addendum)
Amy Roman   [phone] Fax:([phone]) [phone]   Clinic Follow up Note   Patient Care Team: Lin Landsman, MD as PCP - General (Family Medicine) Truitt Merle, MD as Consulting Physician (Hematology) Stark Klein, MD as Consulting Physician (General Surgery) Eppie Gibson, MD as Attending Physician (Radiation Oncology) Alla Feeling, NP as Nurse Practitioner (Nurse Practitioner) 11/16/2020  CHIEF COMPLAINT: Follow up right breast cancer   SUMMARY OF ONCOLOGIC HISTORY: Oncology History Overview Note  Cancer Staging Malignant neoplasm of 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, estrogen receptor positive ([place]) Staging form: Breast, AJCC 8th Edition - Clinical stage from 02/16/2018: Stage IB (cT2(m), cN0, cM0, G2, ER+, PR+, HER2-) - Signed by Truitt Merle, MD on 03/11/2018    Malignant neoplasm of 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, estrogen receptor positive ([place])  01/30/2018 Mammogram   Screening mammogram FINDINGS: In the right breast, possible distortion warrants further evaluation. The patient has had an excisional biopsy of her right breast upper outer quadrant in 2007, however this distortion is either better seen or new from her prior mammogram, and it does not quite match the surgical site as marked by a skin scar marker. In the left breast, no findings suspicious for malignancy.   02/14/2018 Mammogram   Diagnostic mammogram: In the lateral aspect of the right breast, posterior depth there is a prominent area of distortion at the approximate 9 o'clock location. While this is in the region of the excisional biopsy that the patient had in 2007, the distortion has become significantly more prominent as compared to the tomosynthesis mammogram performed in September of 2016, and appears to be inferior to the surgical scar.   02/14/2018 Breast US   Ultrasound of the right breast at 8:30, 4 cm from the nipple demonstrates an irregular hypoechoic  vascular mass with spiculated margins measuring 2.1 x 2.0 x 1.8 cm. There is a small adjacent mass at 9 o'clock, 4 cm from the nipple, which is 1.2 cm away from the dominant mass. The smaller mass measures 1.1 x 0.7 x 0.7 cm. Together, the 2 masses span 3.6 cm of tissue. Ultrasound of the right axilla demonstrates multiple normal-appearing lymph nodes.  IMPRESSION: 1. There is a highly suspicious mass in the right breast at 830 with a small possible satellite lesion at 9 o'clock. 2.  No evidence of right axillary lymphadenopathy.   02/16/2018 Cancer Staging   Staging form: Breast, AJCC 8th Edition - Clinical stage from 02/16/2018: Stage IB (cT2(m), cN0, cM0, G2, ER+, PR+, HER2-) - Signed by Truitt Merle, MD on 03/11/2018   02/16/2018 Initial Biopsy   Diagnosis 1. Breast, right, needle core biopsy, 8:30 o'clock - INVASIVE DUCTAL CARCINOMA, SEE COMMENT. 2. Breast, right, needle core biopsy, 9 o'clock - INVASIVE DUCTAL CARCINOMA. - LOBULAR NEOPLASIA (ATYPICAL LOBULAR HYPERPLASIA).  ER: 95%, positive, strong staining PR: 80%, positive, moderate staining  Proliferation Marker Ki67: 2%  HER2 - Negative Ratio of HER2/CEP17 signals: 1.80 Average HER2 copy number per cell: 2.97    03/01/2018 Imaging   MR Bilateral breast IMPRESSION: Two areas of known malignancy in the LATERAL portion of the RIGHT breast. No additional areas of concern in either breast. No axillary adenopathy.   03/11/2018 Initial Diagnosis   Malignant neoplasm of 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, estrogen receptor positive ([place])   04/05/2018 Surgery   RIGHT BREAST SEED BRACKETED LUMPECTOMY WITH SENTINEL LYMPH NODE BIOPSY by Dr. Barry Dienes 04/05/18   04/05/2018 Pathology Results   Surgery  Diagnosis 04/05/18  1. Breast, lumpectomy, right - INVASIVE DUCTAL CARCINOMA GRADE I/III, TWO FOCI SPANNING 2.9 CM AND 0.9 CM. - LOBULAR NEOPLASIA (LOBULAR CARCINOMA IN SITU). - DUCTAL CARCINOMA IN SITU, LOW GRADE. -  LYMPHOVASCULAR INVASION IS IDENTIFIED. - THE SURGICAL RESECTION MARGINS ARE NEGATIVE FOR DUCTAL CARCINOMA. - SEE ONCOLOGY TABLE BELOW. 2. Lymph node, sentinel, biopsy, right - THERE IS NO EVIDENCE OF CARCINOMA IN 1 OF 1 LYMPH NODE (0/1). 3. Lymph node, sentinel, biopsy, right - METASTATIC CARCINOMA IN 1 OF 1 LYMPH NODE (1/1) WITH EXTRACAPSULAR EXTENSION. - EXTRACAPSULAR EXTENSION EXTENDS 0.3 CM. 4. Lymph node, sentinel, biopsy, right - DUCTAL CARCINOMA. - PERINEURAL INVASION IS IDENTIFIED. - SEE COMMENT. 5. Lymph node, sentinel, biopsy, right 1 of 4   04/05/2018 Miscellaneous   Mammaprint Low Risk with 10% risk of recurrance with Tamoxifen alone  MPI at +0.240 DMFI at 97.8%   04/12/2018 Cancer Staging   Staging form: Breast, AJCC 8th Edition - Pathologic: Stage IA (pT2, pN1a, cM0, G1, ER+, PR+, HER2-) - Signed by Truitt Merle, MD on 04/12/2018   04/13/2018 Imaging   Whole Body Bone scan 04/13/18 IMPRESSION: No scintigraphic evidence of osseous metastatic disease.   04/16/2018 Imaging   CT CAP W Contrast 04/16/18  IMPRESSION: 1. No definite findings of metastatic disease in the chest. Solitary solid 4 mm right lower lobe pulmonary nodule, for which initial chest CT follow-up is advised in 3 months. 2. Postsurgical changes from right breast lumpectomy and right axillary node dissection. Thin-walled fluid collections in the right breast and right axilla are most compatible with postsurgical seromas. 3. No evidence of metastatic disease in the abdomen or pelvis.    04/17/2018 Surgery   AXILLARY LYMPH NODE DISSECTION by Dr. Barry Dienes 04/17/18     04/17/2018 Pathology Results   Diagnosis 04/17/18  Lymph nodes, regional resection, Right Axillary - BENIGN FIBROADIPOSE AND BREAST TISSUE WITH FAT NECROSIS. - NO LYMPHOID TISSUE PRESENT. - NO MALIGNANCY IDENTIFIED. Microscopic Comment The entire specimen was submitted.   05/28/2018 - 07/10/2018 Radiation Therapy   Radiation treatment dates:    05/28/2018-07/10/2018  Site/dose:    1. Right breast, 2 Gy in 25 fractions for a total dose of 50 Gy 2. Right SCV, 2 Gy in 25 fractions for a total dose of 50 Gy 3. Right breast boost, 2 Gy in 5 fractions for a total dose of 5 Gy           07/24/2018 -  Anti-estrogen oral therapy   Letrozole 2.76m daily starting 07/24/18    Survivorship   Per LCira Rue NP      CURRENT THERAPY: Letrozole 2.5 mg daily starting 07/2018  INTERVAL HISTORY: Amy Roman for follow up as scheduled. She was last seen 04/2020. Mammogram in 05/2020 was negative/normal. She is followed by Dr. VMickeal Skinnerfor hereditary and idiopathic neuropathy which is stable on B12 and gabapentin. She had right knee replacement 2 months ago, recovered well and needs the other one. She has normal intermittent nausea anywhere from 1-4 months after surgeries. Denies decreased appetite or weight loss. Denies abdominal pain or change in bowel habits. She has chronic low back pain that hurts to touch, but mild and stable. Denies new bone/joint pain. Denies concerns in her breast, fever, chills, cough, chest pain, dyspnea, leg edema, bleeding, hot flash, or new concerns from baseline.    MEDICAL HISTORY:  Past Medical History:  Diagnosis Date  . Anemia   . Anxiety and depression 10/15/2014  . Arthritis  BILATERAL KNEES, HAD CORTISONE INJECTION 03/2017  . Brain cancer ([place])   . Breast cancer ([place]) 2019   Right Breast Cancer  . Depression   . GERD (gastroesophageal reflux disease)    OCCASIONAL WITH CERTAIN FOOD  . Headache   . Heart murmur   . History of radiation therapy 05/28/18- 07/10/18   Right Breast 25 fractions for a total dose of 50 Gy, Right SCV and PAB 25 fractions for a total dose of 50 Gy, right breast boost, 5 fractions for a total dose of 10 Gy.   . Personal history of radiation therapy    Right Breast Cancer  . Seasonal allergies   . SVD (spontaneous vaginal delivery)    x 4  . Unspecified hereditary and  idiopathic peripheral neuropathy 05/14/2014   LEFT KNEE AND FOOT    SURGICAL HISTORY: Past Surgical History:  Procedure Laterality Date  . ABDOMINAL HYSTERECTOMY    . AXILLARY LYMPH NODE DISSECTION Right 04/17/2018   Procedure: AXILLARY LYMPH NODE DISSECTION;  Surgeon: Stark Klein, MD;  Location: [facility];  Service: General;  Laterality: Right;  . BREAST EXCISIONAL BIOPSY Right 2007  . BREAST LUMPECTOMY Right 2019  . BREAST LUMPECTOMY WITH RADIOACTIVE SEED AND SENTINEL LYMPH NODE BIOPSY Right 04/05/2018   Procedure: RIGHT BREAST SEED BRACKETED LUMPECTOMY WITH SENTINEL LYMPH NODE BIOPSY;  Surgeon: Stark Klein, MD;  Location: [place];  Service: General;  Laterality: Right;  . BREAST SURGERY     bx, no cancer   . CATARACT EXTRACTION Bilateral   . DILATATION & CURETTAGE/HYSTEROSCOPY WITH MYOSURE N/A 08/09/2017   Procedure: DILATATION & CURETTAGE/HYSTEROSCOPY WITH MYOSURE;  Surgeon: Emily Filbert, MD;  Location: [facility];  Service: Gynecology;  Laterality: N/A;  . LABIOPLASTY  03/06/2018   Procedure: LABIAPLASTY;  Surgeon: Emily Filbert, MD;  Location: [facility];  Service: Gynecology;;  . TOTAL KNEE ARTHROPLASTY Right 09/14/2020   Procedure: RIGHT TOTAL KNEE ARTHROPLASTY;  Surgeon: Leandrew Koyanagi, MD;  Location: [facility];  Service: Orthopedics;  Laterality: Right;  Marland Kitchen VAGINAL HYSTERECTOMY Bilateral 03/06/2018   Procedure: HYSTERECTOMY VAGINAL WITH BILATERAL SALPINGOOPHORECTOMY;  Surgeon: Emily Filbert, MD;  Location: [facility];  Service: Gynecology;  Laterality: Bilateral;    I have reviewed the social history and family history with the patient and they are unchanged from previous note.  ALLERGIES:  has No Known Allergies.  MEDICATIONS:  Current Outpatient Medications  Medication Sig Dispense Refill  . amitriptyline (ELAVIL) 25 MG tablet TAKE 2 TABLETS(50 MG) BY MOUTH AT BEDTIME. MAY INCREASE TO 75 MG AT BEDTIME AFTER 2 WEEKS (Patient not taking: Reported on  09/09/2020) 60 tablet 3  . aspirin EC 81 MG tablet Take 1 tablet (81 mg total) by mouth 2 (two) times daily. To be taken after surgery to prevent blood clots 84 tablet 0  . b complex vitamins tablet Take 1 tablet by mouth daily.    . cephALEXin (KEFLEX) 500 MG capsule Take 1 capsule (500 mg total) by mouth 4 (four) times daily. 40 capsule 0  . cholecalciferol (VITAMIN D3) 25 MCG (1000 UNIT) tablet Take 1,000 Units by mouth daily.    Marland Kitchen docusate sodium (COLACE) 100 MG capsule Take 1 capsule (100 mg total) by mouth daily as needed. 30 capsule 2  . ferrous sulfate 325 (65 FE) MG EC tablet Take 325 mg by mouth 3 (three) times daily with meals. (Patient not taking: Reported on 09/09/2020)    . FLUoxetine (PROZAC) 40 MG capsule Take  40 mg by mouth every morning.    . gabapentin (NEURONTIN) 300 MG capsule Take 1 capsule (300 mg total) by mouth 3 (three) times daily. 90 capsule 3  . hydrOXYzine (ATARAX/VISTARIL) 10 MG tablet Take 10 mg by mouth 3 (three) times daily as needed for anxiety.     Marland Kitchen letrozole (FEMARA) 2.5 MG tablet Take 1 tablet (2.5 mg total) by mouth daily. 90 tablet 3  . methocarbamol (ROBAXIN) 500 MG tablet Take 1 tablet (500 mg total) by mouth 2 (two) times daily as needed. To be taken after surgery as needed 20 tablet 0  . omeprazole (PRILOSEC) 40 MG capsule TAKE 1 CAPSULE(40 MG) BY MOUTH TWICE DAILY FOR 10 DAYS (Patient not taking: Reported on 09/09/2020) 20 capsule 0  . ondansetron (ZOFRAN) 4 MG tablet Take 1 tablet (4 mg total) by mouth every 8 (eight) hours as needed for nausea or vomiting. 40 tablet 0  . oxyCODONE-acetaminophen (PERCOCET) 5-325 MG tablet Take 1-2 tablets by mouth every 6 (six) hours as needed. 40 tablet 0  . promethazine (PHENERGAN) 25 MG tablet Take 1 tablet (25 mg total) by mouth every 6 (six) hours as needed for nausea. 30 tablet 1  . traZODone (DESYREL) 100 MG tablet Take 100 mg by mouth at bedtime.    . vitamin B-12 (CYANOCOBALAMIN) 500 MCG tablet Take 500 mcg by  mouth daily.     No current facility-administered medications for this visit.    PHYSICAL EXAMINATION: ECOG PERFORMANCE STATUS: 1 - Symptomatic but completely ambulatory  Vitals:   11/16/20 1450  BP: 136/63  Pulse: 65  Resp: 18  Temp: 98.2 F (36.8 C)  SpO2: 100%   Filed Weights   11/16/20 1450  Weight: 203 lb 6.4 oz (92.3 kg)    GENERAL:alert, no distress and comfortable SKIN: no rash  EYES: sclera clear NECK: without mass LYMPH:  no palpable cervical or supraclavicular lymphadenopathy  LUNGS:  normal breathing effort ABDOMEN:abdomen soft, non-tender and normal bowel sounds. No hepatomegaly or mass Musculoskeletal: TTP at coccyx  NEURO: alert & oriented x 3 with fluent speech Breast: s/p right lumpectomy. Incisions well healed with scar tissue. No palpable mass in either breast or axilla that I could appreciate    LABORATORY DATA:  I have reviewed the data as listed CBC Latest Ref Rng & Units 11/16/2020 09/15/2020 09/10/2020  WBC 4.0 - 10.5 K/uL 2.9(L) 10.7(H) 4.0  Hemoglobin 12.0 - 15.0 g/dL 10.8(L) 10.5(L) 13.2  Hematocrit 36.0 - 46.0 % 34.9(L) 32.9(L) 40.9  Platelets 150 - 400 K/uL 203 211 257     CMP Latest Ref Rng & Units 09/15/2020 09/10/2020 11/29/2019  Glucose 70 - 99 mg/dL 117(H) 92 75  BUN 8 - 23 mg/dL 17 25(H) 22  Creatinine 0.44 - 1.00 mg/dL 1.03(H) 1.43(H) 0.97  Sodium 135 - 145 mmol/L 139 139 141  Potassium 3.5 - 5.1 mmol/L 5.6(H) 4.6 4.5  Chloride 98 - 111 mmol/L 104 102 104  CO2 22 - 32 mmol/L '25 26 29  ' Calcium 8.9 - 10.3 mg/dL 9.3 10.1 9.3  Total Protein 6.5 - 8.1 g/dL - 7.1 6.8  Total Bilirubin 0.3 - 1.2 mg/dL - 0.5 0.2(L)  Alkaline Phos 38 - 126 U/L - 102 110  AST 15 - 41 U/L - 26 27  ALT 0 - 44 U/L - 18 20      RADIOGRAPHIC STUDIES: I have personally reviewed the radiological images as listed and agreed with the findings in the report. No results found.  ASSESSMENT & PLAN: Amy Roman a 70 y.o.African femalewith a  history of Arthritis and neuropathy  1. 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 oncotype showed low risk, adjuvant chemo was not recommended.  -began adjuvant AI with letrozole in 07/2018, tolerating well. Goal is 5-10 years -mammograms in August -continue surveillance   2. Lung nodules -Found on 04/16/18 CT scan, stable on 02/2020 repeat CT chest -will continue monitoring   3. Bone Health  -baseline DEXA 10/29/19 showed osteopenia T score -2 at [facility]. Low frax score at hip with 1% fracture risk in 10 years -repeat 10/2021  4. Osteoarthritis, neuropathy  -She had significant arthritis in her b/l knees; s/p right knee replacement in 08/2020 -followed by Dr. Mickeal Skinner for neuropathy, stable on oral B12 and gabapentin -B12 level pending today   5. Anxiety, depression -stable on fluoxetine, trazadone   6. Iron and B12 deficiency anemia -she developed mild anemia Hg 10.3 in 02/2018, worsened to 9.2 in 06/2018 with work up showing iron 22, TIBC 505, 4% sat, and ferritin 9, normal B12, MMA, and folate. consistent with IDA -She did have B12 196 which is low range/normal about 6 years ago. On oral B12 500 mcg daily  -colonoscopy 09/2018 (Nandigam) showed diverticulosis and non bleeding internal hemorrhoids. She source of her IDA has not been confirmed.  -she did not tolerate oral iron and declined RBC transfusion -she received IV Feraheme in the past, responded well.   Disposition:  Amy Roman is clinically doing well, tolerating letrozole. Breast exam is unremarkable. Labs show WBC 2.9, Hg 10.8, Ferritin 29. CMP, B12, and CA 27.29 are pending. Mammogram from 05/2020 is negative. There is no clinical concern for recurrence.   Iron is low range normal, I recommend to start multivitamin with Iron. If B12 is normal, her  mild anemia may be from recent knee surgery.   She will continue surveillance and letrozole. Next routine f/up in 6 months. Mammograms in August. We reviewed s/sx of recurrence/metastasis. She will call sooner if she develops new bone pain, abdominal pain, juandice, weight loss, severe fatigue.    Orders Placed This Encounter  Procedures  . MM DIAG BREAST TOMO BILATERAL    Standing Status:   Future    Standing Expiration Date:   11/16/2021    Order Specific Question:   Reason for Exam (SYMPTOM  OR DIAGNOSIS REQUIRED)    Answer:   h/o right breast cancer 2019, lumpectomy and RT. on letrozole    Order Specific Question:   Preferred imaging location?    Answer:   [facility]  . Vitamin B12    Standing Status:   Future    Number of Occurrences:   1    Standing Expiration Date:   11/16/2021  . CMP ([place] only)    Standing Status:   Standing    Number of Occurrences:   20    Standing Expiration Date:   11/16/2021   All questions were answered. The patient knows to call the clinic with any problems, questions or concerns. No barriers to learning were detected with Arabic speaking interpreter. Total encounter time was 30 minutes.     Alla Feeling, NP 11/16/20

## 2020-11-16 ENCOUNTER — Encounter: Payer: Self-pay | Admitting: Nurse Practitioner

## 2020-11-16 ENCOUNTER — Inpatient Hospital Stay: Payer: Medicare Other

## 2020-11-16 ENCOUNTER — Inpatient Hospital Stay: Payer: Medicare Other | Attending: Internal Medicine | Admitting: Nurse Practitioner

## 2020-11-16 ENCOUNTER — Other Ambulatory Visit: Payer: Self-pay

## 2020-11-16 VITALS — BP 136/63 | HR 65 | Temp 98.2°F | Resp 18 | Ht 61.0 in | Wt 203.4 lb

## 2020-11-16 DIAGNOSIS — F419 Anxiety disorder, unspecified: Secondary | ICD-10-CM | POA: Diagnosis not present

## 2020-11-16 DIAGNOSIS — F32A Depression, unspecified: Secondary | ICD-10-CM | POA: Insufficient documentation

## 2020-11-16 DIAGNOSIS — Z7982 Long term (current) use of aspirin: Secondary | ICD-10-CM | POA: Diagnosis not present

## 2020-11-16 DIAGNOSIS — E538 Deficiency of other specified B group vitamins: Secondary | ICD-10-CM

## 2020-11-16 DIAGNOSIS — Z170 Estrogen receptor positive status [ER+]: Secondary | ICD-10-CM

## 2020-11-16 DIAGNOSIS — Z923 Personal history of irradiation: Secondary | ICD-10-CM | POA: Diagnosis not present

## 2020-11-16 DIAGNOSIS — G629 Polyneuropathy, unspecified: Secondary | ICD-10-CM | POA: Insufficient documentation

## 2020-11-16 DIAGNOSIS — M1990 Unspecified osteoarthritis, unspecified site: Secondary | ICD-10-CM | POA: Insufficient documentation

## 2020-11-16 DIAGNOSIS — D509 Iron deficiency anemia, unspecified: Secondary | ICD-10-CM

## 2020-11-16 DIAGNOSIS — M8580 Other specified disorders of bone density and structure, unspecified site: Secondary | ICD-10-CM | POA: Insufficient documentation

## 2020-11-16 DIAGNOSIS — Z79811 Long term (current) use of aromatase inhibitors: Secondary | ICD-10-CM | POA: Diagnosis not present

## 2020-11-16 DIAGNOSIS — Z79899 Other long term (current) drug therapy: Secondary | ICD-10-CM | POA: Insufficient documentation

## 2020-11-16 DIAGNOSIS — K219 Gastro-esophageal reflux disease without esophagitis: Secondary | ICD-10-CM | POA: Insufficient documentation

## 2020-11-16 DIAGNOSIS — R011 Cardiac murmur, unspecified: Secondary | ICD-10-CM | POA: Insufficient documentation

## 2020-11-16 DIAGNOSIS — M170 Bilateral primary osteoarthritis of knee: Secondary | ICD-10-CM | POA: Insufficient documentation

## 2020-11-16 DIAGNOSIS — C50511 Malignant neoplasm of lower-outer quadrant of right female breast: Secondary | ICD-10-CM

## 2020-11-16 DIAGNOSIS — D519 Vitamin B12 deficiency anemia, unspecified: Secondary | ICD-10-CM | POA: Diagnosis not present

## 2020-11-16 LAB — CMP (CANCER CENTER ONLY)
ALT: 10 U/L (ref 0–44)
AST: 18 U/L (ref 15–41)
Albumin: 3.6 g/dL (ref 3.5–5.0)
Alkaline Phosphatase: 110 U/L (ref 38–126)
Anion gap: 10 (ref 5–15)
BUN: 20 mg/dL (ref 8–23)
CO2: 24 mmol/L (ref 22–32)
Calcium: 9.2 mg/dL (ref 8.9–10.3)
Chloride: 107 mmol/L (ref 98–111)
Creatinine: 1.01 mg/dL — ABNORMAL HIGH (ref 0.44–1.00)
GFR, Estimated: >60 mL/min (ref >60)
Glucose, Bld: 83 mg/dL (ref 70–99)
Potassium: 4.1 mmol/L (ref 3.5–5.1)
Sodium: 141 mmol/L (ref 135–145)
Total Bilirubin: 0.2 mg/dL — ABNORMAL LOW (ref 0.3–1.2)
Total Protein: 6.5 g/dL (ref 6.5–8.1)

## 2020-11-16 LAB — CBC WITH DIFFERENTIAL (CANCER CENTER ONLY)
Abs Immature Granulocytes: 0.01 10*3/uL (ref 0.00–0.07)
Basophils Absolute: 0.1 10*3/uL (ref 0.0–0.1)
Basophils Relative: 2 %
Eosinophils Absolute: 0.1 10*3/uL (ref 0.0–0.5)
Eosinophils Relative: 3 %
HCT: 34.9 % — ABNORMAL LOW (ref 36.0–46.0)
Hemoglobin: 10.8 g/dL — ABNORMAL LOW (ref 12.0–15.0)
Immature Granulocytes: 0 %
Lymphocytes Relative: 28 %
Lymphs Abs: 0.8 10*3/uL (ref 0.7–4.0)
MCH: 29.4 pg (ref 26.0–34.0)
MCHC: 30.9 g/dL (ref 30.0–36.0)
MCV: 95.1 fL (ref 80.0–100.0)
Monocytes Absolute: 0.3 10*3/uL (ref 0.1–1.0)
Monocytes Relative: 10 %
Neutro Abs: 1.7 10*3/uL (ref 1.7–7.7)
Neutrophils Relative %: 57 %
Platelet Count: 203 10*3/uL (ref 150–400)
RBC: 3.67 MIL/uL — ABNORMAL LOW (ref 3.87–5.11)
RDW: 13.2 % (ref 11.5–15.5)
WBC Count: 2.9 10*3/uL — ABNORMAL LOW (ref 4.0–10.5)
nRBC: 0 % (ref 0.0–0.2)

## 2020-11-16 LAB — IRON AND TIBC
Iron: 57 ug/dL (ref 41–142)
Saturation Ratios: 13 % — ABNORMAL LOW (ref 21–57)
TIBC: 439 ug/dL (ref 236–444)
UIBC: 382 ug/dL (ref 120–384)

## 2020-11-16 LAB — VITAMIN B12: Vitamin B-12: 1055 pg/mL — ABNORMAL HIGH (ref 180–914)

## 2020-11-16 LAB — FERRITIN: Ferritin: 29 ng/mL (ref 11–307)

## 2020-11-16 MED ORDER — LETROZOLE 2.5 MG PO TABS: 2.5000 mg | ORAL_TABLET | Freq: Every day | ORAL | 3 refills | Status: DC

## 2020-11-17 ENCOUNTER — Telehealth: Payer: Self-pay

## 2020-11-17 ENCOUNTER — Ambulatory Visit: Payer: Medicare Other

## 2020-11-17 LAB — CANCER ANTIGEN 27.29: CA 27.29: 27.5 U/mL (ref 0.0–38.6)

## 2020-11-17 NOTE — Telephone Encounter (Signed)
-----   Message from Alla Feeling, NP sent at 11/16/2020  4:10 PM EST ----- Please use interpreter to let her know iron is low range/normal. I recommend to start multivitamin with Iron (such as prenatal vitamin) if she cannot tolerate regular ferrous sulfate once daily. Will follow up on B12 and other labs from today.   Thanks, Regan Rakers, NP

## 2020-11-17 NOTE — Telephone Encounter (Signed)
Pt called using [employer] transl[employer] [place] operator [phone] message left stating this nurse will call tomorrow to review labs nothing concerning

## 2020-11-19 ENCOUNTER — Telehealth: Payer: Self-pay

## 2020-11-19 ENCOUNTER — Other Ambulatory Visit: Payer: Self-pay

## 2020-11-19 ENCOUNTER — Ambulatory Visit: Payer: Medicare Other

## 2020-11-19 DIAGNOSIS — M6281 Muscle weakness (generalized): Secondary | ICD-10-CM

## 2020-11-19 DIAGNOSIS — M25561 Pain in right knee: Secondary | ICD-10-CM | POA: Diagnosis not present

## 2020-11-19 DIAGNOSIS — R262 Difficulty in walking, not elsewhere classified: Secondary | ICD-10-CM

## 2020-11-19 DIAGNOSIS — M25661 Stiffness of right knee, not elsewhere classified: Secondary | ICD-10-CM

## 2020-11-19 NOTE — Telephone Encounter (Signed)
-----   Message from Lacie K Burton, NP sent at 11/16/2020  4:10 PM EST ----- Please use interpreter to let her know iron is low range/normal. I recommend to start multivitamin with Iron (such as prenatal vitamin) if she cannot tolerate regular ferrous sulfate once daily. Will follow up on B12 and other labs from today.   Thanks, Lacie, NP  

## 2020-11-19 NOTE — Telephone Encounter (Signed)
Several calls placed to pt unable to reach voice mail left using interpretor asking for a return call to center for lab results attempts to contact continue

## 2020-11-19 NOTE — Therapy (Signed)
[place], [place], [address] Phone: [phone]   Fax:  [phone]  Physical Therapy Treatment  Patient Details  Name: Amy Roman MRN: [id number] Date of Birth: [birth year] Referring Provider (PT): Dwana Melena, [place]   Encounter Date: 11/19/2020   PT End of Session - 11/19/20 1749    Visit Number 6    Number of Visits 18    Date for PT Re-Evaluation 11/12/20    Authorization Type Medicare, progress note by visit 10    PT Start Time 1730   patient late   PT Stop Time 1800    PT Time Calculation (min) 30 min    Activity Tolerance Patient tolerated treatment well    Behavior During Therapy [facility] for tasks assessed/performed           Past Medical History:  Diagnosis Date  . Anemia   . Anxiety and depression 10/15/2014  . Arthritis    BILATERAL KNEES, HAD CORTISONE INJECTION 03/2017  . Brain cancer ([place])   . Breast cancer ([place]) 2019   Right Breast Cancer  . Depression   . GERD (gastroesophageal reflux disease)    OCCASIONAL WITH CERTAIN FOOD  . Headache   . Heart murmur   . History of radiation therapy 05/28/18- 07/10/18   Right Breast 25 fractions for a total dose of 50 Gy, Right SCV and PAB 25 fractions for a total dose of 50 Gy, right breast boost, 5 fractions for a total dose of 10 Gy.   . Personal history of radiation therapy    Right Breast Cancer  . Seasonal allergies   . SVD (spontaneous vaginal delivery)    x 4  . Unspecified hereditary and idiopathic peripheral neuropathy 05/14/2014   LEFT KNEE AND FOOT    Past Surgical History:  Procedure Laterality Date  . ABDOMINAL HYSTERECTOMY    . AXILLARY LYMPH NODE DISSECTION Right 04/17/2018   Procedure: AXILLARY LYMPH NODE DISSECTION;  Surgeon: Stark Klein, MD;  Location: [facility];  Service: General;  Laterality: Right;  . BREAST EXCISIONAL BIOPSY Right 2007  . BREAST LUMPECTOMY Right 2019  . BREAST LUMPECTOMY  WITH RADIOACTIVE SEED AND SENTINEL LYMPH NODE BIOPSY Right 04/05/2018   Procedure: RIGHT BREAST SEED BRACKETED LUMPECTOMY WITH SENTINEL LYMPH NODE BIOPSY;  Surgeon: Stark Klein, MD;  Location: [facility];  Service: General;  Laterality: Right;  . BREAST SURGERY     bx, no cancer   . CATARACT EXTRACTION Bilateral   . DILATATION & CURETTAGE/HYSTEROSCOPY WITH MYOSURE N/A 08/09/2017   Procedure: DILATATION & CURETTAGE/HYSTEROSCOPY WITH MYOSURE;  Surgeon: Emily Filbert, MD;  Location: [facility];  Service: Gynecology;  Laterality: N/A;  . LABIOPLASTY  03/06/2018   Procedure: LABIAPLASTY;  Surgeon: Emily Filbert, MD;  Location: [facility];  Service: Gynecology;;  . TOTAL KNEE ARTHROPLASTY Right 09/14/2020   Procedure: RIGHT TOTAL KNEE ARTHROPLASTY;  Surgeon: Leandrew Koyanagi, MD;  Location: [facility];  Service: Orthopedics;  Laterality: Right;  Marland Kitchen VAGINAL HYSTERECTOMY Bilateral 03/06/2018   Procedure: HYSTERECTOMY VAGINAL WITH BILATERAL SALPINGOOPHORECTOMY;  Surgeon: Emily Filbert, MD;  Location: [facility];  Service: Gynecology;  Laterality: Bilateral;    There were no vitals filed for this visit.   Subjective Assessment - 11/19/20 1747    Subjective Patient reports her knee is feeling better and she gets occasional pain in the Rt knee. Her Lt knee is bothering her more, but she has surgery scheduled in May for a  knee replacement. She reports compliance with HEP.    Pertinent History Right breast CA 2019 s/p lumpectomy and radiation, brain CA, chronic bilat. LE edema, depression and anxiety, left knee OA, neuropathy    How long can you stand comfortably? I can do my cleaning,housework for 30 minutes then I have to sit and rest    How long can you walk comfortably? I can do my cleaning,housework for 30 minutes then I have to sit and rest    Currently in Pain? No/denies              [facility] PT Assessment - 11/19/20 0001      AROM   Right Knee Extension 0    Right Knee Flexion 130      Strength    Right Knee Flexion 5/5    Right Knee Extension 5/5    Left Knee Flexion 5/5    Left Knee Extension 5/5                         [facility] Adult PT Treatment/Exercise - 11/19/20 0001      Knee/Hip Exercises: Aerobic   Nustep level 6 5 minutes      Knee/Hip Exercises: Standing   Abduction Limitations 2 x 10    Extension Limitations 2 x 10    Other Standing Knee Exercises march on airex 2  x10    Other Standing Knee Exercises calf raise 2 x 10                  PT Education - 11/19/20 1859    Education Details discharge education. review of HEP.    Person(s) Educated Patient    Methods Explanation    Comprehension Verbalized understanding            PT Short Term Goals - 11/05/20 1826      PT SHORT TERM GOAL #1   Title Independent with initial HEP    Baseline updated from home health therapy at eval    Time 3    Period Weeks    Status Achieved      PT SHORT TERM GOAL #2   Title Increase right knee flexion AROM at least 10 deg to improve ability for stair navigation and tranfers from low seated    Baseline 90 deg    Time 3    Period Weeks    Status Achieved      PT SHORT TERM GOAL #3   Title Pt. to ambulate at least 100 feet mod I with SPC for progression to LTG for independent community level ambulation without AD    Baseline uses RW    Time 3    Period Weeks    Status Achieved             PT Long Term Goals - 11/19/20 1802      PT LONG TERM GOAL #1   Title Increase right knee extension strength to at least 4/5 or greater to improve ability for transfers from low seats and to work towards stair navigation with reciprocal gait    Baseline 3-/5    Time 6    Period Weeks    Status Achieved      PT LONG TERM GOAL #2   Title Pt. to be able to perform independent household and short distance community ambulation for distances <500 feet without AD    Baseline uses RW, limited ambulation tolerance for community mobilty  Time 6    Period  Weeks    Status Achieved      PT LONG TERM GOAL #3   Title Increase right knee flexion AROM to 105 deg or greater and knee extension within 2 deg of neutral to improve ability for stair navigation, transfers from low seats abd bending knee for chores/cleaning    Baseline 4-90 deg flexion    Time 6    Period Weeks    Status Achieved      PT LONG TERM GOAL #4   Title Tolerate standing for activities such as cooking and shopping for periods at least 30-40 min with right knee pain 3/10 or less    Time 6    Period Weeks    Status Achieved                 Plan - 11/19/20 1833    Clinical Impression Statement Patient has progressed well throughout her duration of care having met all established short term and long term functional goals. She reports mild occasional Rt knee pain, though it does not interfere with her ability to complete daily activity.She is therefore appropriate for discharge at this time to home exercise program.    Personal Factors and Comorbidities Comorbidity 3+    Comorbidities LE edema, CA history, left knee OA, neuropathy, anxiety/depression    Examination-Activity Limitations Transfers;Carry;Dressing;Sleep;Locomotion Level;Stand;Stairs;Lift;Squat;Bathing;Bed Mobility;Toileting    Examination-Participation Restrictions Shop;Cleaning;Community Activity;Meal Prep    Stability/Clinical Decision Making Evolving/Moderate complexity    Rehab Potential Good    PT Frequency --    PT Duration --    PT Treatment/Interventions ADLs/Self Care Home Management;Cryotherapy;Therapeutic activities;Therapeutic exercise;Neuromuscular re-education;Stair training;Gait training;Functional mobility training;Passive range of motion;Vasopneumatic Device;Patient/family education;Balance training    PT Next Visit Plan no estim due to CA history,    PT Home Exercise Plan Access code: [facility]    Consulted and Agree with Plan of Care Patient           Patient will benefit from skilled  therapeutic intervention in order to improve the following deficits and impairments:  Abnormal gait,Difficulty walking,Decreased balance,Decreased mobility,Pain,Decreased strength,Decreased activity tolerance,Decreased range of motion  Visit Diagnosis: Right knee pain, unspecified chronicity  Stiffness of right knee, not elsewhere classified  Difficulty in walking, not elsewhere classified  Muscle weakness (generalized)     Problem List Patient Active Problem List   Diagnosis Date Noted  . Status post total knee replacement, right 09/14/2020  . Primary osteoarthritis of right knee 09/13/2020  . Iron deficiency anemia 07/23/2018  . Breast cancer metastasized to axillary lymph node, right ([place]) 04/17/2018  . Malignant neoplasm of 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, estrogen receptor positive ([place]) 03/11/2018  . Post-operative state 03/06/2018  . Uterine hyperplasia 10/11/2017  . Anxiety and depression 10/15/2014  . Vitamin B 12 deficiency 06/02/2014  . Vitamin D deficiency 06/02/2014  . Hereditary and idiopathic peripheral neuropathy 05/14/2014   PHYSICAL THERAPY DISCHARGE SUMMARY  Visits from Start of Care: 6  Current functional level related to goals / functional outcomes: See above   Remaining deficits: See above   Education / Equipment: See above  Plan: Patient agrees to discharge.  Patient goals were met. Patient is being discharged due to meeting the stated rehab goals.  ?????         Gwendolyn Grant, PT, DPT, ATC 11/19/20 7:26 PM  [facility] [address] [place], [place], [address] Phone: [phone]   Fax:  [phone]  Name: Amy  Vickii Roman MRN: [id number] Date of Birth: 1951/06/27

## 2020-12-08 ENCOUNTER — Ambulatory Visit: Payer: Medicare Other | Admitting: Orthopaedic Surgery

## 2020-12-15 ENCOUNTER — Ambulatory Visit: Payer: Medicare Other | Admitting: Orthopaedic Surgery

## 2020-12-22 ENCOUNTER — Ambulatory Visit (INDEPENDENT_AMBULATORY_CARE_PROVIDER_SITE_OTHER): Payer: Medicare Other

## 2020-12-22 ENCOUNTER — Other Ambulatory Visit: Payer: Self-pay

## 2020-12-22 ENCOUNTER — Encounter: Payer: Self-pay | Admitting: Orthopaedic Surgery

## 2020-12-22 ENCOUNTER — Ambulatory Visit (INDEPENDENT_AMBULATORY_CARE_PROVIDER_SITE_OTHER): Payer: Medicare Other | Admitting: Orthopaedic Surgery

## 2020-12-22 DIAGNOSIS — Z96651 Presence of right artificial knee joint: Secondary | ICD-10-CM | POA: Diagnosis not present

## 2020-12-22 NOTE — Progress Notes (Signed)
Post-Op Visit Note   Patient: Amy Roman           Date of Birth: December 23, 1950           MRN: [id number] Visit Date: 12/22/2020 PCP: Lin Landsman, MD   Assessment & Plan:  Chief Complaint:  Chief Complaint  Patient presents with  . Right Knee - Pain   Visit Diagnoses:  1. History of total knee replacement, right     Plan:   Patient is 39-month status post right total knee replacement.  She reports no pain and overall doing well.  She is very happy with her outcome so far.  She is walking so much better now.  She is looking forward to the left knee replacement.  Right knee shows a fully healed surgical scar with excellent range of motion without pain.  No swelling.  Impression is 3 months status post right total knee replacement with excellent outcome so far.  She will follow-up in about 2 months to schedule the left knee replacement and to get a letter for her brother to come help her in the [place] from [place].  Follow-Up Instructions: Return in about 2 months (around 02/21/2021).   Orders:  Orders Placed This Encounter  Procedures  . XR Knee 1-2 Views Right   No orders of the defined types were placed in this encounter.   Imaging: XR Knee 1-2 Views Right  Result Date: 12/22/2020 Stable total knee replacement in good alignment    PMFS History: Patient Active Problem List   Diagnosis Date Noted  . Status post total knee replacement, right 09/14/2020  . Primary osteoarthritis of right knee 09/13/2020  . Iron deficiency anemia 07/23/2018  . Breast cancer metastasized to axillary lymph node, right ([place]) 04/17/2018  . Malignant neoplasm of 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, estrogen receptor positive ([place]) 03/11/2018  . Post-operative state 03/06/2018  . Uterine hyperplasia 10/11/2017  . Anxiety and depression 10/15/2014  . Vitamin B 12 deficiency 06/02/2014  . Vitamin D deficiency 06/02/2014  . Hereditary and idiopathic peripheral  neuropathy 05/14/2014   Past Medical History:  Diagnosis Date  . Anemia   . Anxiety and depression 10/15/2014  . Arthritis    BILATERAL KNEES, HAD CORTISONE INJECTION 03/2017  . Brain cancer ([place])   . Breast cancer ([place]) 2019   Right Breast Cancer  . Depression   . GERD (gastroesophageal reflux disease)    OCCASIONAL WITH CERTAIN FOOD  . Headache   . Heart murmur   . History of radiation therapy 05/28/18- 07/10/18   Right Breast 25 fractions for a total dose of 50 Gy, Right SCV and PAB 25 fractions for a total dose of 50 Gy, right breast boost, 5 fractions for a total dose of 10 Gy.   . Personal history of radiation therapy    Right Breast Cancer  . Seasonal allergies   . SVD (spontaneous vaginal delivery)    x 4  . Unspecified hereditary and idiopathic peripheral neuropathy 05/14/2014   LEFT KNEE AND FOOT    Family History  Problem Relation Age of Onset  . Diabetes Mother   . Neuropathy Mother   . Neuropathy Sister   . Cancer Sister        unknown type cancer in neck   . Other Father   . Cancer Maternal Aunt        leukemia   . Colon cancer Neg Hx   . Breast cancer Neg Hx  Past Surgical History:  Procedure Laterality Date  . ABDOMINAL HYSTERECTOMY    . AXILLARY LYMPH NODE DISSECTION Right 04/17/2018   Procedure: AXILLARY LYMPH NODE DISSECTION;  Surgeon: Stark Klein, MD;  Location: [facility];  Service: General;  Laterality: Right;  . BREAST EXCISIONAL BIOPSY Right 2007  . BREAST LUMPECTOMY Right 2019  . BREAST LUMPECTOMY WITH RADIOACTIVE SEED AND SENTINEL LYMPH NODE BIOPSY Right 04/05/2018   Procedure: RIGHT BREAST SEED BRACKETED LUMPECTOMY WITH SENTINEL LYMPH NODE BIOPSY;  Surgeon: Stark Klein, MD;  Location: [facility];  Service: General;  Laterality: Right;  . BREAST SURGERY     bx, no cancer   . CATARACT EXTRACTION Bilateral   . DILATATION & CURETTAGE/HYSTEROSCOPY WITH MYOSURE N/A 08/09/2017   Procedure: DILATATION &  CURETTAGE/HYSTEROSCOPY WITH MYOSURE;  Surgeon: Emily Filbert, MD;  Location: [facility];  Service: Gynecology;  Laterality: N/A;  . LABIOPLASTY  03/06/2018   Procedure: LABIAPLASTY;  Surgeon: Emily Filbert, MD;  Location: [facility];  Service: Gynecology;;  . TOTAL KNEE ARTHROPLASTY Right 09/14/2020   Procedure: RIGHT TOTAL KNEE ARTHROPLASTY;  Surgeon: Leandrew Koyanagi, MD;  Location: [facility];  Service: Orthopedics;  Laterality: Right;  Marland Kitchen VAGINAL HYSTERECTOMY Bilateral 03/06/2018   Procedure: HYSTERECTOMY VAGINAL WITH BILATERAL SALPINGOOPHORECTOMY;  Surgeon: Emily Filbert, MD;  Location: [facility];  Service: Gynecology;  Laterality: Bilateral;   Social History   Occupational History  . Occupation: house   Tobacco Use  . Smoking status: Never Smoker  . Smokeless tobacco: Never Used  Vaping Use  . Vaping Use: Never used  Substance and Sexual Activity  . Alcohol use: No  . Drug use: No  . Sexual activity: Not on file

## 2021-02-18 ENCOUNTER — Other Ambulatory Visit: Payer: Self-pay | Admitting: Hematology

## 2021-02-18 DIAGNOSIS — Z170 Estrogen receptor positive status [ER+]: Secondary | ICD-10-CM

## 2021-02-22 ENCOUNTER — Other Ambulatory Visit: Payer: Self-pay

## 2021-02-22 DIAGNOSIS — Z170 Estrogen receptor positive status [ER+]: Secondary | ICD-10-CM

## 2021-02-22 DIAGNOSIS — C50511 Malignant neoplasm of lower-outer quadrant of right female breast: Secondary | ICD-10-CM

## 2021-02-22 MED ORDER — LETROZOLE 2.5 MG PO TABS: 2.5000 mg | ORAL_TABLET | Freq: Every day | ORAL | 3 refills | Status: DC

## 2021-03-29 DIAGNOSIS — G629 Polyneuropathy, unspecified: Secondary | ICD-10-CM | POA: Insufficient documentation

## 2021-03-30 ENCOUNTER — Ambulatory Visit: Payer: Medicare Other | Admitting: Orthopaedic Surgery

## 2021-04-13 ENCOUNTER — Telehealth: Payer: Self-pay | Admitting: Orthopaedic Surgery

## 2021-04-13 NOTE — Telephone Encounter (Signed)
Patient's son called to say his mother is ready to schedule her left total knee surgery. It is starting to give her a lot pain. He states patient had right knee done in March and is just waiting for Dr. Erlinda Hong to tell her when the other can be done.   Please provide surgery sheet if surgery is in order.     Pt's cb  [phone]

## 2021-04-15 NOTE — Telephone Encounter (Signed)
She can have surgery at this time if she's ready.  Can she come in for visit.  Thanks.

## 2021-04-16 NOTE — Telephone Encounter (Signed)
Lvm for son to cb to schedule appt.

## 2021-04-16 NOTE — Telephone Encounter (Signed)
Next available?

## 2021-04-16 NOTE — Telephone Encounter (Signed)
Did you want me to work her in next week or next available?

## 2021-04-21 ENCOUNTER — Other Ambulatory Visit: Payer: Self-pay

## 2021-04-21 ENCOUNTER — Ambulatory Visit (INDEPENDENT_AMBULATORY_CARE_PROVIDER_SITE_OTHER): Payer: Medicare Other | Admitting: Orthopaedic Surgery

## 2021-04-21 ENCOUNTER — Ambulatory Visit (INDEPENDENT_AMBULATORY_CARE_PROVIDER_SITE_OTHER): Payer: Medicare Other

## 2021-04-21 ENCOUNTER — Encounter: Payer: Self-pay | Admitting: Orthopaedic Surgery

## 2021-04-21 ENCOUNTER — Ambulatory Visit: Payer: Self-pay

## 2021-04-21 DIAGNOSIS — M25561 Pain in right knee: Secondary | ICD-10-CM

## 2021-04-21 DIAGNOSIS — M1712 Unilateral primary osteoarthritis, left knee: Secondary | ICD-10-CM | POA: Insufficient documentation

## 2021-04-21 DIAGNOSIS — Z96651 Presence of right artificial knee joint: Secondary | ICD-10-CM

## 2021-04-21 NOTE — Progress Notes (Signed)
Office Visit Note   Patient: Amy Roman           Date of Birth: 14-Jun-1951           MRN: [id number] Visit Date: 04/21/2021              Requested by: Lin Landsman, MD [address],  [place] [address] PCP: Lin Landsman, MD   Assessment & Plan: Visit Diagnoses:  1. Status post total knee replacement, right   2. Primary osteoarthritis of left knee     Plan: In terms of the right knee replacement she is approximately 7 months from this and has done very well and she is very pleased.  For the left knee she has failed all conservative treatments and would like to proceed with a left total knee replacement in the near future.  Again we talked about the risk benefits rehab recovery time.  All this was discussed through the interpreter.  All questions answered to her satisfaction.  Follow-Up Instructions: Return for postop.   Orders:  Orders Placed This Encounter  Procedures   XR KNEE 3 VIEW LEFT   XR KNEE 3 VIEW RIGHT   No orders of the defined types were placed in this encounter.     Procedures: No procedures performed   Clinical Data: No additional findings.   Subjective: Chief Complaint  Patient presents with   Left Knee - Pain    Patient is a 70 year old [place] female who is status post right total knee replacement in November 2021.  She did well from the surgery and was very pleased and has had a good recovery and outcome so far.  She is here for follow-up of this knee as well as chronic severe DJD of the left knee.  She is open to have a knee replacement for the left knee in the near future.  She has had significant difficulty with ADLs.  She has fallen several times due to the left knee giving way.   Review of Systems  Constitutional: Negative.   HENT: Negative.    Eyes: Negative.   Respiratory: Negative.    Cardiovascular: Negative.   Endocrine: Negative.   Musculoskeletal: Negative.   Neurological: Negative.   Hematological:  Negative.   Psychiatric/Behavioral: Negative.    All other systems reviewed and are negative.   Objective: Vital Signs: There were no vitals taken for this visit.  Physical Exam Vitals and nursing note reviewed.  Constitutional:      Appearance: She is well-developed.  Pulmonary:     Effort: Pulmonary effort is normal.  Skin:    General: Skin is warm.     Capillary Refill: Capillary refill takes less than 2 seconds.  Neurological:     Mental Status: She is alert and oriented to person, place, and time.  Psychiatric:        Behavior: Behavior normal.        Thought Content: Thought content normal.        Judgment: Judgment normal.    Ortho Exam Right knee shows a fully healed surgical scar.  Range of motion is back to baseline.  Stable to varus valgus stress. Left knee shows a varus deformity.  Severe pain and crepitus with range of motion.  Collaterals and cruciates are grossly intact. Specialty Comments:  No specialty comments available.  Imaging: XR KNEE 3 VIEW LEFT  Result Date: 04/21/2021 Severe tricompartmental DJD with lateral subluxation of the tibia  XR KNEE 3 VIEW RIGHT  Result Date: 04/21/2021 Stable total knee replacement without complication.    PMFS History: Patient Active Problem List   Diagnosis Date Noted   Primary osteoarthritis of left knee 04/21/2021   Status post total knee replacement, right 09/14/2020   Primary osteoarthritis of right knee 09/13/2020   Iron deficiency anemia 07/23/2018   Breast cancer metastasized to axillary lymph node, right ([place]) 04/17/2018   Malignant neoplasm of 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, estrogen receptor positive ([place]) 03/11/2018   Post-operative state 03/06/2018   Uterine hyperplasia 10/11/2017   Anxiety and depression 10/15/2014   Vitamin B 12 deficiency 06/02/2014   Vitamin D deficiency 06/02/2014   Hereditary and idiopathic peripheral neuropathy 05/14/2014   Past Medical History:   Diagnosis Date   Anemia    Anxiety and depression 10/15/2014   Arthritis    BILATERAL KNEES, HAD CORTISONE INJECTION 03/2017   Brain cancer ([place])    Breast cancer ([place]) 2019   Right Breast Cancer   Depression    GERD (gastroesophageal reflux disease)    OCCASIONAL WITH CERTAIN FOOD   Headache    Heart murmur    History of radiation therapy 05/28/18- 07/10/18   Right Breast 25 fractions for a total dose of 50 Gy, Right SCV and PAB 25 fractions for a total dose of 50 Gy, right breast boost, 5 fractions for a total dose of 10 Gy.    Personal history of radiation therapy    Right Breast Cancer   Seasonal allergies    SVD (spontaneous vaginal delivery)    x 4   Unspecified hereditary and idiopathic peripheral neuropathy 05/14/2014   LEFT KNEE AND FOOT    Family History  Problem Relation Age of Onset   Diabetes Mother    Neuropathy Mother    Neuropathy Sister    Cancer Sister        unknown type cancer in neck    Other Father    Cancer Maternal Aunt        leukemia    Colon cancer Neg Hx    Breast cancer Neg Hx     Past Surgical History:  Procedure Laterality Date   ABDOMINAL HYSTERECTOMY     AXILLARY LYMPH NODE DISSECTION Right 04/17/2018   Procedure: AXILLARY LYMPH NODE DISSECTION;  Surgeon: Stark Klein, MD;  Location: [facility];  Service: General;  Laterality: Right;   BREAST EXCISIONAL BIOPSY Right 2007   BREAST LUMPECTOMY Right 2019   BREAST LUMPECTOMY WITH RADIOACTIVE SEED AND SENTINEL LYMPH NODE BIOPSY Right 04/05/2018   Procedure: RIGHT BREAST SEED BRACKETED LUMPECTOMY WITH SENTINEL LYMPH NODE BIOPSY;  Surgeon: Stark Klein, MD;  Location: [facility];  Service: General;  Laterality: Right;   BREAST SURGERY     bx, no cancer    CATARACT EXTRACTION Bilateral    DILATATION & CURETTAGE/HYSTEROSCOPY WITH MYOSURE N/A 08/09/2017   Procedure: DILATATION & CURETTAGE/HYSTEROSCOPY WITH MYOSURE;  Surgeon: Emily Filbert, MD;  Location: [facility];   Service: Gynecology;  Laterality: N/A;   LABIOPLASTY  03/06/2018   Procedure: LABIAPLASTY;  Surgeon: Emily Filbert, MD;  Location: [facility];  Service: Gynecology;;   TOTAL KNEE ARTHROPLASTY Right 09/14/2020   Procedure: RIGHT TOTAL KNEE ARTHROPLASTY;  Surgeon: Leandrew Koyanagi, MD;  Location: [facility];  Service: Orthopedics;  Laterality: Right;   VAGINAL HYSTERECTOMY Bilateral 03/06/2018   Procedure: HYSTERECTOMY VAGINAL WITH BILATERAL SALPINGOOPHORECTOMY;  Surgeon: Emily Filbert, MD;  Location: [facility];  Service: Gynecology;  Laterality: Bilateral;  Social History   Occupational History   Occupation: house   Tobacco Use   Smoking status: Never   Smokeless tobacco: Never  Vaping Use   Vaping Use: Never used  Substance and Sexual Activity   Alcohol use: No   Drug use: No   Sexual activity: Not on file

## 2021-04-27 ENCOUNTER — Other Ambulatory Visit: Payer: Self-pay | Admitting: Hematology

## 2021-04-27 DIAGNOSIS — C50511 Malignant neoplasm of lower-outer quadrant of right female breast: Secondary | ICD-10-CM

## 2021-05-17 ENCOUNTER — Other Ambulatory Visit: Payer: Self-pay

## 2021-05-17 ENCOUNTER — Inpatient Hospital Stay: Payer: Medicare Other

## 2021-05-17 ENCOUNTER — Inpatient Hospital Stay: Payer: Medicare Other | Attending: Hematology | Admitting: Hematology

## 2021-05-17 ENCOUNTER — Encounter: Payer: Self-pay | Admitting: Hematology

## 2021-05-17 VITALS — BP 133/73 | HR 81 | Temp 98.3°F | Resp 20 | Wt 219.6 lb

## 2021-05-17 DIAGNOSIS — G620 Drug-induced polyneuropathy: Secondary | ICD-10-CM | POA: Insufficient documentation

## 2021-05-17 DIAGNOSIS — Z170 Estrogen receptor positive status [ER+]: Secondary | ICD-10-CM | POA: Diagnosis not present

## 2021-05-17 DIAGNOSIS — C50511 Malignant neoplasm of lower-outer quadrant of right female breast: Secondary | ICD-10-CM

## 2021-05-17 DIAGNOSIS — E2839 Other primary ovarian failure: Secondary | ICD-10-CM

## 2021-05-17 DIAGNOSIS — E538 Deficiency of other specified B group vitamins: Secondary | ICD-10-CM

## 2021-05-17 DIAGNOSIS — F32A Depression, unspecified: Secondary | ICD-10-CM | POA: Insufficient documentation

## 2021-05-17 DIAGNOSIS — Z79811 Long term (current) use of aromatase inhibitors: Secondary | ICD-10-CM | POA: Diagnosis not present

## 2021-05-17 DIAGNOSIS — Z7982 Long term (current) use of aspirin: Secondary | ICD-10-CM | POA: Diagnosis not present

## 2021-05-17 DIAGNOSIS — Z923 Personal history of irradiation: Secondary | ICD-10-CM | POA: Insufficient documentation

## 2021-05-17 DIAGNOSIS — Z79899 Other long term (current) drug therapy: Secondary | ICD-10-CM | POA: Insufficient documentation

## 2021-05-17 DIAGNOSIS — F419 Anxiety disorder, unspecified: Secondary | ICD-10-CM | POA: Insufficient documentation

## 2021-05-17 DIAGNOSIS — R918 Other nonspecific abnormal finding of lung field: Secondary | ICD-10-CM | POA: Diagnosis not present

## 2021-05-17 DIAGNOSIS — D509 Iron deficiency anemia, unspecified: Secondary | ICD-10-CM

## 2021-05-17 LAB — CBC WITH DIFFERENTIAL (CANCER CENTER ONLY)
Abs Immature Granulocytes: 0.01 10*3/uL (ref 0.00–0.07)
Basophils Absolute: 0 10*3/uL (ref 0.0–0.1)
Basophils Relative: 0 %
Eosinophils Absolute: 0.1 10*3/uL (ref 0.0–0.5)
Eosinophils Relative: 2 %
HCT: 36.6 % (ref 36.0–46.0)
Hemoglobin: 11.9 g/dL — ABNORMAL LOW (ref 12.0–15.0)
Immature Granulocytes: 0 %
Lymphocytes Relative: 22 %
Lymphs Abs: 1.2 10*3/uL (ref 0.7–4.0)
MCH: 29.2 pg (ref 26.0–34.0)
MCHC: 32.5 g/dL (ref 30.0–36.0)
MCV: 89.9 fL (ref 80.0–100.0)
Monocytes Absolute: 0.7 10*3/uL (ref 0.1–1.0)
Monocytes Relative: 12 %
Neutro Abs: 3.5 10*3/uL (ref 1.7–7.7)
Neutrophils Relative %: 64 %
Platelet Count: 266 10*3/uL (ref 150–400)
RBC: 4.07 MIL/uL (ref 3.87–5.11)
RDW: 14.5 % (ref 11.5–15.5)
WBC Count: 5.5 10*3/uL (ref 4.0–10.5)
nRBC: 0 % (ref 0.0–0.2)

## 2021-05-17 LAB — CMP (CANCER CENTER ONLY)
ALT: 16 U/L (ref 0–44)
AST: 21 U/L (ref 15–41)
Albumin: 3.3 g/dL — ABNORMAL LOW (ref 3.5–5.0)
Alkaline Phosphatase: 148 U/L — ABNORMAL HIGH (ref 38–126)
Anion gap: 9 (ref 5–15)
BUN: 31 mg/dL — ABNORMAL HIGH (ref 8–23)
CO2: 22 mmol/L (ref 22–32)
Calcium: 9.7 mg/dL (ref 8.9–10.3)
Chloride: 109 mmol/L (ref 98–111)
Creatinine: 1.12 mg/dL — ABNORMAL HIGH (ref 0.44–1.00)
GFR, Estimated: 53 mL/min — ABNORMAL LOW (ref >60)
Glucose, Bld: 124 mg/dL — ABNORMAL HIGH (ref 70–99)
Potassium: 4.9 mmol/L (ref 3.5–5.1)
Sodium: 140 mmol/L (ref 135–145)
Total Bilirubin: 0.3 mg/dL (ref 0.3–1.2)
Total Protein: 7 g/dL (ref 6.5–8.1)

## 2021-05-17 LAB — IRON AND TIBC
Iron: 113 ug/dL (ref 41–142)
Saturation Ratios: 25 % (ref 21–57)
TIBC: 453 ug/dL — ABNORMAL HIGH (ref 236–444)
UIBC: 340 ug/dL (ref 120–384)

## 2021-05-17 LAB — FERRITIN: Ferritin: 28 ng/mL (ref 11–307)

## 2021-05-17 NOTE — Progress Notes (Signed)
[place]   [phone] [phone]   Clinic Follow up Note   Patient Care Team: Lin Landsman, MD as PCP - General (Family Medicine) Truitt Merle, MD as Consulting Physician (Hematology) Stark Klein, MD as Consulting Physician (General Surgery) Eppie Gibson, MD as Attending Physician (Radiation Oncology) Alla Feeling, NP as Nurse Practitioner (Nurse Practitioner)  Date of Service:  05/17/2021  CHIEF COMPLAINT: f/u of right breast cancer  SUMMARY OF ONCOLOGIC HISTORY: Oncology History Overview Note  Cancer Staging Malignant neoplasm of 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, estrogen receptor positive ([place]) Staging form: Breast, AJCC 8th Edition - Clinical stage from 02/16/2018: Stage IB (cT2(m), cN0, cM0, G2, ER+, PR+, HER2-) - Signed by Truitt Merle, MD on 03/11/2018     Malignant neoplasm of 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, estrogen receptor positive ([place])  01/30/2018 Mammogram   Screening mammogram FINDINGS: In the right breast, possible distortion warrants further evaluation. The patient has had an excisional biopsy of her right breast upper outer quadrant in 2007, however this distortion is either better seen or new from her prior mammogram, and it does not quite match the surgical site as marked by a skin scar marker. In the left breast, no findings suspicious for malignancy.    02/14/2018 Mammogram   Diagnostic mammogram: In the lateral aspect of the right breast, posterior depth there is a prominent area of distortion at the approximate 9 o'clock location. While this is in the region of the excisional biopsy that the patient had in 2007, the distortion has become significantly more prominent as compared to the tomosynthesis mammogram performed in September of 2016, and appears to be inferior to the surgical scar.    02/14/2018 Breast US   Ultrasound of the right breast at 8:30, 4 cm from the nipple demonstrates an  irregular hypoechoic vascular mass with spiculated margins measuring 2.1 x 2.0 x 1.8 cm. There is a small adjacent mass at 9 o'clock, 4 cm from the nipple, which is 1.2 cm away from the dominant mass. The smaller mass measures 1.1 x 0.7 x 0.7 cm. Together, the 2 masses span 3.6 cm of tissue. Ultrasound of the right axilla demonstrates multiple normal-appearing lymph nodes.   IMPRESSION: 1. There is a highly suspicious mass in the right breast at 830 with a small possible satellite lesion at 9 o'clock. 2.  No evidence of right axillary lymphadenopathy.    02/16/2018 Cancer Staging   Staging form: Breast, AJCC 8th Edition - Clinical stage from 02/16/2018: Stage IB (cT2(m), cN0, cM0, G2, ER+, PR+, HER2-) - Signed by Truitt Merle, MD on 03/11/2018    02/16/2018 Initial Biopsy   Diagnosis 1. Breast, right, needle core biopsy, 8:30 o'clock - INVASIVE DUCTAL CARCINOMA, SEE COMMENT. 2. Breast, right, needle core biopsy, 9 o'clock - INVASIVE DUCTAL CARCINOMA. - LOBULAR NEOPLASIA (ATYPICAL LOBULAR HYPERPLASIA).  ER: 95%, positive, strong staining PR: 80%, positive, moderate staining  Proliferation Marker Ki67: 2%  HER2 - Negative Ratio of HER2/CEP17 signals: 1.80 Average HER2 copy number per cell: 2.97     03/01/2018 Imaging   MR Bilateral breast IMPRESSION: Two areas of known malignancy in the LATERAL portion of the RIGHT breast. No additional areas of concern in either breast. No axillary adenopathy.   03/11/2018 Initial Diagnosis   Malignant neoplasm of 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, estrogen receptor positive ([place])    04/05/2018 Surgery   RIGHT BREAST SEED BRACKETED LUMPECTOMY WITH SENTINEL LYMPH NODE BIOPSY by  Dr. Barry Dienes 04/05/18    04/05/2018 Pathology Results   Surgery  Diagnosis 04/05/18  1. Breast, lumpectomy, right - INVASIVE DUCTAL CARCINOMA GRADE I/III, TWO FOCI SPANNING 2.9 CM AND 0.9 CM. - LOBULAR NEOPLASIA (LOBULAR CARCINOMA IN SITU). - DUCTAL CARCINOMA IN  SITU, LOW GRADE. - LYMPHOVASCULAR INVASION IS IDENTIFIED. - THE SURGICAL RESECTION MARGINS ARE NEGATIVE FOR DUCTAL CARCINOMA. - SEE ONCOLOGY TABLE BELOW. 2. Lymph node, sentinel, biopsy, right - THERE IS NO EVIDENCE OF CARCINOMA IN 1 OF 1 LYMPH NODE (0/1). 3. Lymph node, sentinel, biopsy, right - METASTATIC CARCINOMA IN 1 OF 1 LYMPH NODE (1/1) WITH EXTRACAPSULAR EXTENSION. - EXTRACAPSULAR EXTENSION EXTENDS 0.3 CM. 4. Lymph node, sentinel, biopsy, right - DUCTAL CARCINOMA. - PERINEURAL INVASION IS IDENTIFIED. - SEE COMMENT. 5. Lymph node, sentinel, biopsy, right 1 of 4    04/05/2018 Miscellaneous   Mammaprint Low Risk with 10% risk of recurrance with Tamoxifen alone  MPI at +0.240 DMFI at 97.8%    04/12/2018 Cancer Staging   Staging form: Breast, AJCC 8th Edition - Pathologic: Stage IA (pT2, pN1a, cM0, G1, ER+, PR+, HER2-) - Signed by Truitt Merle, MD on 04/12/2018    04/13/2018 Imaging   Whole Body Bone scan 04/13/18 IMPRESSION: No scintigraphic evidence of osseous metastatic disease.    04/16/2018 Imaging   CT CAP W Contrast 04/16/18  IMPRESSION: 1. No definite findings of metastatic disease in the chest. Solitary solid 4 mm right lower lobe pulmonary nodule, for which initial chest CT follow-up is advised in 3 months. 2. Postsurgical changes from right breast lumpectomy and right axillary node dissection. Thin-walled fluid collections in the right breast and right axilla are most compatible with postsurgical seromas. 3. No evidence of metastatic disease in the abdomen or pelvis.     04/17/2018 Surgery   AXILLARY LYMPH NODE DISSECTION by Dr. Barry Dienes 04/17/18      04/17/2018 Pathology Results   Diagnosis 04/17/18  Lymph nodes, regional resection, Right Axillary - BENIGN FIBROADIPOSE AND BREAST TISSUE WITH FAT NECROSIS. - NO LYMPHOID TISSUE PRESENT. - NO MALIGNANCY IDENTIFIED. Microscopic Comment The entire specimen was submitted.    05/28/2018 - 07/10/2018 Radiation  Therapy   Radiation treatment dates:   05/28/2018-07/10/2018   Site/dose:    1. Right breast, 2 Gy in 25 fractions for a total dose of 50 Gy 2. Right SCV, 2 Gy in 25 fractions for a total dose of 50 Gy 3. Right breast boost, 2 Gy in 5 fractions for a total dose of 5 Gy            07/24/2018 -  Anti-estrogen oral therapy   Letrozole 2.36m daily starting 07/24/18     Survivorship   Per LCira Rue NP       CURRENT THERAPY:  Letrozole 2.5 mg daily starting 07/2018  INTERVAL HISTORY:  Vernecia A Roman is here for a follow up of breast cancer. She was last seen by NP Lacie on 11/16/20. She presents to the clinic accompanied by an interpreter. She denies any pain to her breast. She notes hot flashes from the letrozole, that do not interfere with her sleep. She reports neuropathy at baseline, for the last 7-8 years. This mostly affects her fingers at night. She denies pain to her neck. She notes the neuropathy is intermittent in her feet. She reports low back pain.   All other systems were reviewed with the patient and are negative.  MEDICAL HISTORY:  Past Medical History:  Diagnosis Date   Anemia  Anxiety and depression 10/15/2014   Arthritis    BILATERAL KNEES, HAD CORTISONE INJECTION 03/2017   Brain cancer ([place])    Breast cancer ([place]) 2019   Right Breast Cancer   Depression    GERD (gastroesophageal reflux disease)    OCCASIONAL WITH CERTAIN FOOD   Headache    Heart murmur    History of radiation therapy 05/28/18- 07/10/18   Right Breast 25 fractions for a total dose of 50 Gy, Right SCV and PAB 25 fractions for a total dose of 50 Gy, right breast boost, 5 fractions for a total dose of 10 Gy.    Personal history of radiation therapy    Right Breast Cancer   Seasonal allergies    SVD (spontaneous vaginal delivery)    x 4   Unspecified hereditary and idiopathic peripheral neuropathy 05/14/2014   LEFT KNEE AND FOOT    SURGICAL HISTORY: Past Surgical History:  Procedure  Laterality Date   ABDOMINAL HYSTERECTOMY     AXILLARY LYMPH NODE DISSECTION Right 04/17/2018   Procedure: AXILLARY LYMPH NODE DISSECTION;  Surgeon: Stark Klein, MD;  Location: [facility];  Service: General;  Laterality: Right;   BREAST EXCISIONAL BIOPSY Right 2007   BREAST LUMPECTOMY Right 2019   BREAST LUMPECTOMY WITH RADIOACTIVE SEED AND SENTINEL LYMPH NODE BIOPSY Right 04/05/2018   Procedure: RIGHT BREAST SEED BRACKETED LUMPECTOMY WITH SENTINEL LYMPH NODE BIOPSY;  Surgeon: Stark Klein, MD;  Location: [facility];  Service: General;  Laterality: Right;   BREAST SURGERY     bx, no cancer    CATARACT EXTRACTION Bilateral    DILATATION & CURETTAGE/HYSTEROSCOPY WITH MYOSURE N/A 08/09/2017   Procedure: [place];  Surgeon: Emily Filbert, MD;  Location: [facility];  Service: Gynecology;  Laterality: N/A;   LABIOPLASTY  03/06/2018   Procedure: LABIAPLASTY;  Surgeon: Emily Filbert, MD;  Location: [facility];  Service: Gynecology;;   TOTAL KNEE ARTHROPLASTY Right 09/14/2020   Procedure: RIGHT TOTAL KNEE ARTHROPLASTY;  Surgeon: Leandrew Koyanagi, MD;  Location: [facility];  Service: Orthopedics;  Laterality: Right;   VAGINAL HYSTERECTOMY Bilateral 03/06/2018   Procedure: HYSTERECTOMY VAGINAL WITH BILATERAL SALPINGOOPHORECTOMY;  Surgeon: Emily Filbert, MD;  Location: [facility];  Service: Gynecology;  Laterality: Bilateral;    I have reviewed the social history and family history with the patient and they are unchanged from previous note.  ALLERGIES:  has No Known Allergies.  MEDICATIONS:  Current Outpatient Medications  Medication Sig Dispense Refill   amitriptyline (ELAVIL) 25 MG tablet TAKE 2 TABLETS(50 MG) BY MOUTH AT BEDTIME. MAY INCREASE TO 75 MG AT BEDTIME AFTER 2 WEEKS (Patient not taking: Reported on 09/09/2020) 60 tablet 3   aspirin EC 81 MG tablet Take 1 tablet (81 mg total) by mouth 2 (two) times daily. To be taken after surgery to prevent  blood clots 84 tablet 0   b complex vitamins tablet Take 1 tablet by mouth daily.     cephALEXin (KEFLEX) 500 MG capsule Take 1 capsule (500 mg total) by mouth 4 (four) times daily. 40 capsule 0   cholecalciferol (VITAMIN D3) 25 MCG (1000 UNIT) tablet Take 1,000 Units by mouth daily.     docusate sodium (COLACE) 100 MG capsule Take 1 capsule (100 mg total) by mouth daily as needed. 30 capsule 2   ferrous sulfate 325 (65 FE) MG EC tablet Take 325 mg by mouth 3 (three) times daily with meals. (Patient not taking: Reported on 09/09/2020)  FLUoxetine (PROZAC) 40 MG capsule Take 40 mg by mouth every morning.     gabapentin (NEURONTIN) 300 MG capsule Take 1 capsule (300 mg total) by mouth 3 (three) times daily. 90 capsule 3   hydrOXYzine (ATARAX/VISTARIL) 10 MG tablet Take 10 mg by mouth 3 (three) times daily as needed for anxiety.      letrozole (FEMARA) 2.5 MG tablet TAKE 1 TABLET BY MOUTH DAILY 90 tablet 3   methocarbamol (ROBAXIN) 500 MG tablet Take 1 tablet (500 mg total) by mouth 2 (two) times daily as needed. To be taken after surgery as needed 20 tablet 0   omeprazole (PRILOSEC) 40 MG capsule TAKE 1 CAPSULE(40 MG) BY MOUTH TWICE DAILY FOR 10 DAYS (Patient not taking: Reported on 09/09/2020) 20 capsule 0   ondansetron (ZOFRAN) 4 MG tablet Take 1 tablet (4 mg total) by mouth every 8 (eight) hours as needed for nausea or vomiting. 40 tablet 0   oxyCODONE-acetaminophen (PERCOCET) 5-325 MG tablet Take 1-2 tablets by mouth every 6 (six) hours as needed. 40 tablet 0   promethazine (PHENERGAN) 25 MG tablet Take 1 tablet (25 mg total) by mouth every 6 (six) hours as needed for nausea. 30 tablet 1   traZODone (DESYREL) 100 MG tablet Take 100 mg by mouth at bedtime.     vitamin B-12 (CYANOCOBALAMIN) 500 MCG tablet Take 500 mcg by mouth daily.     No current facility-administered medications for this visit.    PHYSICAL EXAMINATION: ECOG PERFORMANCE STATUS: 2 - Symptomatic, <50% confined to  bed  Vitals:   05/17/21 1430  BP: 133/73  Pulse: 81  Resp: 20  Temp: 98.3 F (36.8 C)  SpO2: 100%   Filed Weights   05/17/21 1430  Weight: 219 lb 9.6 oz (99.6 kg)    GENERAL:alert, no distress and comfortable SKIN: skin color, texture, turgor are normal, no rashes or significant lesions EYES: normal, Conjunctiva are pink and non-injected, sclera clear  Musculoskeletal:no cyanosis of digits and no clubbing  NEURO: alert & oriented x 3 with fluent speech, no focal motor/sensory deficits BREAST: palpable scar tissue from surgery, no concerns; No palpable mass, nodules or adenopathy bilaterally. Breast exam benign.   LABORATORY DATA:  I have reviewed the data as listed CBC Latest Ref Rng & Units 05/17/2021 11/16/2020 09/15/2020  WBC 4.0 - 10.5 K/uL 5.5 2.9(L) 10.7(H)  Hemoglobin 12.0 - 15.0 g/dL 11.9(L) 10.8(L) 10.5(L)  Hematocrit 36.0 - 46.0 % 36.6 34.9(L) 32.9(L)  Platelets 150 - 400 K/uL 266 203 211     CMP Latest Ref Rng & Units 05/17/2021 11/16/2020 09/15/2020  Glucose 70 - 99 mg/dL 124(H) 83 117(H)  BUN 8 - 23 mg/dL 31(H) 20 17  Creatinine 0.44 - 1.00 mg/dL 1.12(H) 1.01(H) 1.03(H)  Sodium 135 - 145 mmol/L 140 141 139  Potassium 3.5 - 5.1 mmol/L 4.9 4.1 5.6(H)  Chloride 98 - 111 mmol/L 109 107 104  CO2 22 - 32 mmol/L _0 Calcium 8.9 - 10.3 mg/dL 9.7 9.2 9.3  Total Protein 6.5 - 8.1 g/dL 7.0 6.5 -  Total Bilirubin 0.3 - 1.2 mg/dL 0.3 0.2(L) -  Alkaline Phos 38 - 126 U/L 148(H) 110 -  AST 15 - 41 U/L 21 18 -  ALT 0 - 44 U/L 16 10 -    RADIOGRAPHIC STUDIES: I have personally reviewed the radiological images as listed and agreed with the findings in the report. No results found.   ASSESSMENT & PLAN:  Amy Roman is a  70 y.o. female with   1.  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  oncotype showed low risk, adjuvant chemo was not recommended. -began adjuvant AI with letrozole in 07/2018, tolerating well. Goal is 5-10 years -routine mammogram scheduled for 06/18/21 at [facility] -from a breast cancer standpoint, she is clinically doing well. She has some residual scar tissue on physical exam that is tender to palpation. Labs reviewed, overall stable. -continue surveillance, f/u in 6 months   2. Neuropathy, especially in right hand  -it's been ongoing for 7 to 8 years, we before her breast cancer diagnosis.  She was previously followed by Dr. Mickeal Skinner. Her B12 level was normal   -She feels her neuropathy has worsened recently. -She recently met Dr. Ermalene Postin in neurology at [facility] on 03/29/21 and will f/u in 07/2021. -she is on amitriptyline, gabapentin, and Robaxin   3. Iron and B12 deficiency anemia -IDA diagnosed in 02/2018, worsened in 06/2018. -She is also on oral B12 500 mcg daily -colonoscopy 09/2018 (Nandigam) showed diverticulosis and non bleeding internal hemorrhoids. She source of her IDA has not been confirmed. -she did not tolerate oral iron and declined RBC transfusion -she received IV Feraheme in the past, responded well. -Most recent B12 from here was 1055 on 11/16/20 and from [facility] was >1500 on 03/29/21. -Hgb overall improved, 11.9 today (05/17/21). Ferritin and iron pending. I will call her with the results. If low, I recommend IV Feraheme.  4. Bone Health, osteoarthritis -She had significant arthritis in her b/l knees; s/p right knee replacement in 08/2020 with left knee replacement scheduled for 06/07/21. -baseline DEXA 10/29/19 showed osteopenia T score -2 at [facility]. Low frax score at hip with 1% fracture risk in 10 years. Repeat 10/2021 -She does not take extra vit D or calcium. She notes she takes a multivitamin and B12. I recommended she add these.   5. Anxiety, depression -stable on fluoxetine, trazadone   6. Lung nodules -Found on 04/16/18 CT  scan, stable on 02/2020 repeat CT chest -will continue monitoring    PLAN: -Continue letrozole,  -proceed with mammogram on 06/18/21 -repeat DEXA due 10/2021, I ordered today -labs and f/u with NP Lacie in 6 months   No problem-specific Assessment & Plan notes found for this encounter.   Orders Placed This Encounter  Procedures   DG Bone Density    Standing Status:   Future    Standing Expiration Date:   05/17/2022    Order Specific Question:   Reason for Exam (SYMPTOM  OR DIAGNOSIS REQUIRED)    Answer:   screening    Order Specific Question:   Preferred imaging location?    Answer:   [facility]   All questions were answered. The patient knows to call the clinic with any problems, questions or concerns. No barriers to learning was detected. The total time spent in the appointment was 30 minutes.     Truitt Merle, MD 05/17/2021   I, Wilburn Mylar, am acting as scribe for Truitt Merle, MD.   I have reviewed the above documentation for accuracy and completeness, and I agree with the above.

## 2021-05-18 LAB — CANCER ANTIGEN 27.29: CA 27.29: 37.5 U/mL (ref 0.0–38.6)

## 2021-05-31 ENCOUNTER — Other Ambulatory Visit (HOSPITAL_COMMUNITY): Payer: Medicare Other

## 2021-06-07 ENCOUNTER — Ambulatory Visit: Admit: 2021-06-07 | Payer: Medicare Other | Admitting: Orthopaedic Surgery

## 2021-06-07 SURGERY — ARTHROPLASTY, KNEE, TOTAL
Anesthesia: Spinal | Site: Knee | Laterality: Left

## 2021-06-08 ENCOUNTER — Encounter: Payer: Self-pay | Admitting: Nurse Practitioner

## 2021-06-15 ENCOUNTER — Ambulatory Visit (INDEPENDENT_AMBULATORY_CARE_PROVIDER_SITE_OTHER): Payer: Medicare Other | Admitting: Bariatrics

## 2021-06-22 ENCOUNTER — Encounter: Payer: Medicare Other | Admitting: Orthopaedic Surgery

## 2021-06-27 ENCOUNTER — Other Ambulatory Visit: Payer: Self-pay | Admitting: Internal Medicine

## 2021-06-29 ENCOUNTER — Encounter: Payer: Self-pay | Admitting: Nurse Practitioner

## 2021-06-29 ENCOUNTER — Ambulatory Visit (INDEPENDENT_AMBULATORY_CARE_PROVIDER_SITE_OTHER): Payer: Medicare Other | Admitting: Bariatrics

## 2021-07-02 ENCOUNTER — Ambulatory Visit
Admission: RE | Admit: 2021-07-02 | Discharge: 2021-07-02 | Disposition: A | Discharge status: Home / Self Care | Payer: Medicare Other | Source: Ambulatory Visit | Attending: Nurse Practitioner | Admitting: Nurse Practitioner

## 2021-07-02 ENCOUNTER — Encounter: Payer: Self-pay | Admitting: Nurse Practitioner

## 2021-07-02 ENCOUNTER — Other Ambulatory Visit: Payer: Self-pay

## 2021-07-02 DIAGNOSIS — C50511 Malignant neoplasm of lower-outer quadrant of right female breast: Secondary | ICD-10-CM

## 2021-07-29 ENCOUNTER — Other Ambulatory Visit: Payer: Self-pay

## 2021-07-29 DIAGNOSIS — Z170 Estrogen receptor positive status [ER+]: Secondary | ICD-10-CM

## 2021-07-29 DIAGNOSIS — C50511 Malignant neoplasm of lower-outer quadrant of right female breast: Secondary | ICD-10-CM

## 2021-07-29 MED ORDER — LETROZOLE 2.5 MG PO TABS: 2.5000 mg | ORAL_TABLET | Freq: Every day | ORAL | 3 refills | Status: DC

## 2021-10-06 ENCOUNTER — Telehealth: Payer: Self-pay | Admitting: Nurse Practitioner

## 2021-10-06 NOTE — Telephone Encounter (Signed)
Left message with rescheduled upcoming appointment due to provider's PAL. 

## 2021-10-08 ENCOUNTER — Encounter: Payer: Self-pay | Admitting: Nurse Practitioner

## 2021-10-11 ENCOUNTER — Encounter: Payer: Self-pay | Admitting: Neurology

## 2021-10-12 ENCOUNTER — Other Ambulatory Visit: Payer: Self-pay

## 2021-10-12 ENCOUNTER — Ambulatory Visit (INDEPENDENT_AMBULATORY_CARE_PROVIDER_SITE_OTHER): Payer: Medicare Other | Admitting: Orthopaedic Surgery

## 2021-10-12 ENCOUNTER — Ambulatory Visit: Payer: Self-pay

## 2021-10-12 VITALS — Ht 61.0 in | Wt 212.0 lb

## 2021-10-12 DIAGNOSIS — Z96651 Presence of right artificial knee joint: Secondary | ICD-10-CM | POA: Diagnosis not present

## 2021-10-12 DIAGNOSIS — M1712 Unilateral primary osteoarthritis, left knee: Secondary | ICD-10-CM | POA: Diagnosis not present

## 2021-10-12 NOTE — Progress Notes (Signed)
Office Visit Note   Patient: Amy Roman           Date of Birth: Nov 24, 1950           MRN: [id number] Visit Date: 10/12/2021              Requested by: Lin Landsman, [address] [place],  [place] [id number] PCP: Lin Landsman, MD   Assessment & Plan: Visit Diagnoses:  1. Status post total knee replacement, right   2. Primary osteoarthritis of left knee     Plan: In regards to the right knee she is doing very well and very happy.  Dental prophylaxis reinforced.  Recheck in another year with two-view x-rays of the right knee.  In regards to the left knee she is a candidate for total knee replacement this point since she has attained a BMI of 40.  She will continue to work on losing weight until she has her surgery.  Again we talked about the risk benefits rehab recovery.  All questions answered.  Total face to face encounter time was greater than 25 minutes and over half of this time was spent in counseling and/or coordination of care.  Follow-Up Instructions: No follow-ups on file.   Orders:  Orders Placed This Encounter  Procedures   XR KNEE 3 VIEW LEFT   No orders of the defined types were placed in this encounter.     Procedures: No procedures performed   Clinical Data: No additional findings.   Subjective: Chief Complaint  Patient presents with   Right Knee - Follow-up    HPI  Patient is a 70 year old female who comes in for follow-up of left knee DJD as well as 1 year visit for right total knee replacement.  In regards to the right knee replacement she is very happy and very satisfied.  She has resumed her activities.  In regards to the left knee she continues to be extremely limited by the pain and has no quality of life.  She has worked diligently on losing weight.  Her son is present today to help with the language barrier.  Review of Systems  Constitutional: Negative.   HENT: Negative.    Eyes: Negative.   Respiratory: Negative.     Cardiovascular: Negative.   Endocrine: Negative.   Musculoskeletal: Negative.   Neurological: Negative.   Hematological: Negative.   Psychiatric/Behavioral: Negative.    All other systems reviewed and are negative.   Objective: Vital Signs: There were no vitals taken for this visit.  Physical Exam Vitals and nursing note reviewed.  Constitutional:      Appearance: She is well-developed.  Pulmonary:     Effort: Pulmonary effort is normal.  Skin:    General: Skin is warm.     Capillary Refill: Capillary refill takes less than 2 seconds.  Neurological:     Mental Status: She is alert and oriented to person, place, and time.  Psychiatric:        Behavior: Behavior normal.        Thought Content: Thought content normal.        Judgment: Judgment normal.    Ortho Exam  Left knee exam is unchanged.  2+ patellofemoral crepitus with range of motion.  Collaterals and cruciates are stable.  Difficult to assess presence of effusion due to soft tissue envelope.  Specialty Comments:  No specialty comments available.  Imaging: No results found.   PMFS History: Patient Active Problem List  Diagnosis Date Noted   Primary osteoarthritis of left knee 04/21/2021   Status post total knee replacement, right 09/14/2020   Primary osteoarthritis of right knee 09/13/2020   Iron deficiency anemia 07/23/2018   Breast cancer metastasized to axillary lymph node, right ([place]) 04/17/2018   Malignant neoplasm of 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, estrogen receptor positive ([place]) 03/11/2018   Post-operative state 03/06/2018   Uterine hyperplasia 10/11/2017   Anxiety and depression 10/15/2014   Vitamin B 12 deficiency 06/02/2014   Vitamin D deficiency 06/02/2014   Hereditary and idiopathic peripheral neuropathy 05/14/2014   Past Medical History:  Diagnosis Date   Anemia    Anxiety and depression 10/15/2014   Arthritis    BILATERAL KNEES, HAD CORTISONE INJECTION 03/2017    Brain cancer ([place])    Breast cancer ([place]) 2019   Right Breast Cancer   Depression    GERD (gastroesophageal reflux disease)    OCCASIONAL WITH CERTAIN FOOD   Headache    Heart murmur    History of radiation therapy 05/28/18- 07/10/18   Right Breast 25 fractions for a total dose of 50 Gy, Right SCV and PAB 25 fractions for a total dose of 50 Gy, right breast boost, 5 fractions for a total dose of 10 Gy.    Personal history of radiation therapy    Right Breast Cancer   Seasonal allergies    SVD (spontaneous vaginal delivery)    x 4   Unspecified hereditary and idiopathic peripheral neuropathy 05/14/2014   LEFT KNEE AND FOOT    Family History  Problem Relation Age of Onset   Diabetes Mother    Neuropathy Mother    Neuropathy Sister    Cancer Sister        unknown type cancer in neck    Other Father    Cancer Maternal Aunt        leukemia    Colon cancer Neg Hx    Breast cancer Neg Hx     Past Surgical History:  Procedure Laterality Date   ABDOMINAL HYSTERECTOMY     AXILLARY LYMPH NODE DISSECTION Right 04/17/2018   Procedure: AXILLARY LYMPH NODE DISSECTION;  Surgeon: Stark Klein, MD;  Location: [facility];  Service: General;  Laterality: Right;   BREAST EXCISIONAL BIOPSY Right 2007   BREAST LUMPECTOMY Right 2019   BREAST LUMPECTOMY WITH RADIOACTIVE SEED AND SENTINEL LYMPH NODE BIOPSY Right 04/05/2018   Procedure: RIGHT BREAST SEED BRACKETED LUMPECTOMY WITH SENTINEL LYMPH NODE BIOPSY;  Surgeon: Stark Klein, MD;  Location: [facility];  Service: General;  Laterality: Right;   BREAST SURGERY     bx, no cancer    CATARACT EXTRACTION Bilateral    DILATATION & CURETTAGE/HYSTEROSCOPY WITH MYOSURE N/A 08/09/2017   Procedure: DILATATION & CURETTAGE/HYSTEROSCOPY WITH MYOSURE;  Surgeon: Emily Filbert, MD;  Location: [facility];  Service: Gynecology;  Laterality: N/A;   LABIOPLASTY  03/06/2018   Procedure: LABIAPLASTY;  Surgeon: Emily Filbert, MD;  Location: [facility];   Service: Gynecology;;   TOTAL KNEE ARTHROPLASTY Right 09/14/2020   Procedure: RIGHT TOTAL KNEE ARTHROPLASTY;  Surgeon: Leandrew Koyanagi, MD;  Location: [facility];  Service: Orthopedics;  Laterality: Right;   VAGINAL HYSTERECTOMY Bilateral 03/06/2018   Procedure: HYSTERECTOMY VAGINAL WITH BILATERAL SALPINGOOPHORECTOMY;  Surgeon: Emily Filbert, MD;  Location: [facility];  Service: Gynecology;  Laterality: Bilateral;   Social History   Occupational History   Occupation: house   Tobacco Use   Smoking status:  Never   Smokeless tobacco: Never  Vaping Use   Vaping Use: Never used  Substance and Sexual Activity   Alcohol use: No   Drug use: No   Sexual activity: Not on file

## 2021-10-24 ENCOUNTER — Encounter: Payer: Self-pay | Admitting: Nurse Practitioner

## 2021-10-27 NOTE — Progress Notes (Deleted)
[facility] Clinic Note - Initial Visit   Date: 10/27/21  Amy Roman MRN: [id number] DOB: 11-Aug-1951   Dear Dr Amy Landsman, MD:  Thank you for your kind referral of Amy Roman for consultation of ***. Although her history is well known to you, please allow [place] to reiterate it for the purpose of our medical record. The patient was accompanied to the clinic by *** who also provides collateral information.     History of Present Illness: Amy Roman is a 71 y.o. right-handed [place] female with history of benign right breast mass resection, vitamin B12 deficiency, lymphedema, and bilateral cataract presenting for evaluation of neuropathy.  She was evaluated by me in 2015 for the same complaints and more recently saw Dr. Ermalene Roman, neurologist at ***.  Starting around 2006, she began having numbness and burning pain in the lower legs and feet.    Starting around 2006, she developed numbness and burning pain especially from the knees down.  She tried using an ibuprofen ointment which helped.  Pain is constant and worse with prolonged standing or walking for greater than 10-15 min.  She has a lot of low back pain with radiating pain into the left leg. She was recently started on gabapentin 300mg  TID which improved pain slightly, but did not completely alleviate.  Although it was recommended to further titrate, patient reluctant to increase the dose and requesting trial of Lyrica instead, reporting that she is very sensitive to medication but denies any specific side effects related to gabapentin.  Of note, part of her neuropathy work-up did show vitamin B12 and vitamin D deficiency for which she is being supplemented.  Out-side paper records, electronic medical record, and images have been reviewed where available and summarized as:  Labs 05/14/2014:  Vitamin B12 196*, HbA1c 5.9, TSH 3.19, ferritin 45, CK 89, vitamin D 14* .  Out-side  paper records, electronic medical record, and images have been reviewed where available and summarized as: *** Lab Results  Component Value Date   HGBA1C 5.6 03/11/2015   Lab Results  Component Value Date   VITAMINB12 1,055 (H) 11/16/2020   Lab Results  Component Value Date   TSH 3.19 05/14/2014   No results found for: ESRSEDRATE, POCTSEDRATE  Past Medical History:  Diagnosis Date   Anemia    Anxiety and depression 10/15/2014   Arthritis    BILATERAL KNEES, HAD CORTISONE INJECTION 03/2017   Brain cancer (Amy Roman)    Breast cancer (Amy Roman) 2019   Right Breast Cancer   Depression    GERD (gastroesophageal reflux disease)    OCCASIONAL WITH CERTAIN FOOD   Headache    Heart murmur    History of radiation therapy 05/28/18- 07/10/18   Right Breast 25 fractions for a total dose of 50 Gy, Right SCV and PAB 25 fractions for a total dose of 50 Gy, right breast boost, 5 fractions for a total dose of 10 Gy.    Personal history of radiation therapy    Right Breast Cancer   Seasonal allergies    SVD (spontaneous vaginal delivery)    x 4   Unspecified hereditary and idiopathic peripheral neuropathy 05/14/2014   LEFT KNEE AND FOOT    Past Surgical History:  Procedure Laterality Date   ABDOMINAL HYSTERECTOMY     AXILLARY LYMPH NODE DISSECTION Right 04/17/2018   Procedure: AXILLARY LYMPH NODE DISSECTION;  Surgeon: Amy Klein, MD;  Location: [facility];  Service: General;  Laterality: Right;   BREAST EXCISIONAL BIOPSY Right 2007   BREAST LUMPECTOMY Right 2019   BREAST LUMPECTOMY WITH RADIOACTIVE SEED AND SENTINEL LYMPH NODE BIOPSY Right 04/05/2018   Procedure: RIGHT BREAST SEED BRACKETED LUMPECTOMY WITH SENTINEL LYMPH NODE BIOPSY;  Surgeon: Amy Klein, MD;  Location: [facility];  Service: General;  Laterality: Right;   BREAST SURGERY     bx, no cancer    CATARACT EXTRACTION Bilateral    DILATATION & CURETTAGE/HYSTEROSCOPY WITH MYOSURE N/A 08/09/2017    Procedure: DILATATION & CURETTAGE/HYSTEROSCOPY WITH MYOSURE;  Surgeon: Amy Filbert, MD;  Location: [facility];  Service: Gynecology;  Laterality: N/A;   LABIOPLASTY  03/06/2018   Procedure: LABIAPLASTY;  Surgeon: Amy Filbert, MD;  Location: [facility];  Service: Gynecology;;   TOTAL KNEE ARTHROPLASTY Right 09/14/2020   Procedure: RIGHT TOTAL KNEE ARTHROPLASTY;  Surgeon: Amy Koyanagi, MD;  Location: [facility];  Service: Orthopedics;  Laterality: Right;   VAGINAL HYSTERECTOMY Bilateral 03/06/2018   Procedure: HYSTERECTOMY VAGINAL WITH BILATERAL SALPINGOOPHORECTOMY;  Surgeon: Amy Filbert, MD;  Location: [facility];  Service: Gynecology;  Laterality: Bilateral;     Medications:  Outpatient Encounter Medications as of 10/29/2021  Medication Sig   amitriptyline (ELAVIL) 25 MG tablet TAKE 2 TABLETS(50 MG) BY MOUTH AT BEDTIME. MAY INCREASE TO 75 MG AT BEDTIME AFTER 2 WEEKS   aspirin EC 81 MG tablet Take 1 tablet (81 mg total) by mouth 2 (two) times daily. To be taken after surgery to prevent blood clots   b complex vitamins tablet Take 1 tablet by mouth daily.   cephALEXin (KEFLEX) 500 MG capsule Take 1 capsule (500 mg total) by mouth 4 (four) times daily.   cholecalciferol (VITAMIN D3) 25 MCG (1000 UNIT) tablet Take 1,000 Units by mouth daily.   ferrous sulfate 325 (65 FE) MG EC tablet Take 325 mg by mouth 3 (three) times daily with meals. (Patient not taking: Reported on 09/09/2020)   FLUoxetine (PROZAC) 40 MG capsule Take 40 mg by mouth every morning.   gabapentin (NEURONTIN) 300 MG capsule Take 1 capsule (300 mg total) by mouth 3 (three) times daily.   hydrOXYzine (ATARAX/VISTARIL) 10 MG tablet Take 10 mg by mouth 3 (three) times daily as needed for anxiety.    letrozole (FEMARA) 2.5 MG tablet Take 1 tablet (2.5 mg total) by mouth daily.   methocarbamol (ROBAXIN) 500 MG tablet Take 1 tablet (500 mg total) by mouth 2 (two) times daily as needed. To be taken after surgery as needed   omeprazole (PRILOSEC) 40 MG  capsule TAKE 1 CAPSULE(40 MG) BY MOUTH TWICE DAILY FOR 10 DAYS (Patient not taking: Reported on 09/09/2020)   ondansetron (ZOFRAN) 4 MG tablet Take 1 tablet (4 mg total) by mouth every 8 (eight) hours as needed for nausea or vomiting.   oxyCODONE-acetaminophen (PERCOCET) 5-325 MG tablet Take 1-2 tablets by mouth every 6 (six) hours as needed.   promethazine (PHENERGAN) 25 MG tablet Take 1 tablet (25 mg total) by mouth every 6 (six) hours as needed for nausea.   traZODone (DESYREL) 100 MG tablet Take 100 mg by mouth at bedtime.   vitamin B-12 (CYANOCOBALAMIN) 500 MCG tablet Take 500 mcg by mouth daily.   No facility-administered encounter medications on file as of 10/29/2021.    Allergies: No Known Allergies  Family History: Family History  Problem Relation Age of Onset   Diabetes Mother    Neuropathy Mother    Neuropathy Sister    Cancer Sister  unknown type cancer in neck    Other Father    Cancer Maternal Aunt        leukemia    Colon cancer Neg Hx    Breast cancer Neg Hx     Social History: Social History   Tobacco Use   Smoking status: Never   Smokeless tobacco: Never  Vaping Use   Vaping Use: Never used  Substance Use Topics   Alcohol use: No   Drug use: No   Social History   Social History Narrative   Original from [place]   Moved to the [place] 2000   Widow    She lives with son.  She had four sons.          Vital Signs:  There were no vitals taken for this visit.   General Medical Exam:  *** General:  Well appearing, comfortable.   Eyes/ENT: see cranial nerve examination.   Neck:   No carotid bruits. Respiratory:  Clear to auscultation, good air entry bilaterally.   Cardiac:  Regular rate and rhythm, no murmur.   Extremities:  No deformities, edema, or skin discoloration.  Skin:  No rashes or lesions.  Neurological Exam: MENTAL STATUS including orientation to time, place, person, recent and remote memory, attention span and concentration,  language, and fund of knowledge is ***normal.  Speech is not dysarthric.  CRANIAL NERVES: II:  No visual field defects.  Unremarkable fundi.   III-IV-VI: Pupils equal round and reactive to light.  Normal conjugate, extra-ocular eye movements in all directions of gaze.  No nystagmus.  No ptosis***.   V:  Normal facial sensation.    VII:  Normal facial symmetry and movements.   VIII:  Normal hearing and vestibular function.   IX-X:  Normal palatal movement.   XI:  Normal shoulder shrug and head rotation.   XII:  Normal tongue strength and range of motion, no deviation or fasciculation.  MOTOR:  No atrophy, fasciculations or abnormal movements.  No pronator drift.   Upper Extremity:  Right  Left  Deltoid  5/5   5/5   Biceps  5/5   5/5   Triceps  5/5   5/5   Infraspinatus 5/5  5/5  Medial pectoralis 5/5  5/5  Wrist extensors  5/5   5/5   Wrist flexors  5/5   5/5   Finger extensors  5/5   5/5   Finger flexors  5/5   5/5   Dorsal interossei  5/5   5/5   Abductor pollicis  5/5   5/5   Tone (Ashworth scale)  0  0   Lower Extremity:  Right  Left  Hip flexors  5/5   5/5   Hip extensors  5/5   5/5   Adductor 5/5  5/5  Abductor 5/5  5/5  Knee flexors  5/5   5/5   Knee extensors  5/5   5/5   Dorsiflexors  5/5   5/5   Plantarflexors  5/5   5/5   Toe extensors  5/5   5/5   Toe flexors  5/5   5/5   Tone (Ashworth scale)  0  0   MSRs:  Right        Left                  brachioradialis 2+  2+  biceps 2+  2+  triceps 2+  2+  patellar 2+  2+  ankle jerk 2+  2+  Hoffman no  no  plantar response down  down   SENSORY:  Normal and symmetric perception of light touch, pinprick, vibration, and proprioception.  Romberg's sign absent.   COORDINATION/GAIT: Normal finger-to- nose-finger and heel-to-shin.  Intact rapid alternating movements bilaterally.  Able to rise from a chair without using arms.  Gait narrow based and stable. Tandem and stressed gait intact.     IMPRESSION: ***  PLAN/RECOMMENDATIONS:  *** Return to clinic in *** months.  Total time spent: ***   Thank you for allowing me to participate in patient's care.  If I can answer any additional questions, I would be pleased to do so.    Sincerely,    Ilissa Rosner K. Posey Pronto, DO

## 2021-10-29 ENCOUNTER — Ambulatory Visit: Payer: Commercial Managed Care - HMO | Admitting: Neurology

## 2021-11-02 ENCOUNTER — Other Ambulatory Visit: Payer: Self-pay

## 2021-11-02 DIAGNOSIS — Z170 Estrogen receptor positive status [ER+]: Secondary | ICD-10-CM

## 2021-11-02 MED ORDER — LETROZOLE 2.5 MG PO TABS: 2.5000 mg | ORAL_TABLET | Freq: Every day | ORAL | 3 refills | Status: DC

## 2021-11-04 ENCOUNTER — Encounter: Payer: Self-pay | Admitting: Nurse Practitioner

## 2021-11-09 ENCOUNTER — Other Ambulatory Visit: Payer: Self-pay

## 2021-11-09 DIAGNOSIS — D509 Iron deficiency anemia, unspecified: Secondary | ICD-10-CM

## 2021-11-10 ENCOUNTER — Inpatient Hospital Stay: Payer: Commercial Managed Care - HMO

## 2021-11-10 ENCOUNTER — Inpatient Hospital Stay: Payer: Commercial Managed Care - HMO | Admitting: Nurse Practitioner

## 2021-11-10 ENCOUNTER — Telehealth: Payer: Self-pay | Admitting: Nurse Practitioner

## 2021-11-10 NOTE — Telephone Encounter (Signed)
Sch per 1/18 inbasket, pt son aware

## 2021-11-10 NOTE — Progress Notes (Deleted)
[place]   Telephone:([phone] [phone]) [phone]   Clinic Follow up Note   Patient Care Team: Lin Landsman, MD as PCP - General (Family Medicine) Truitt Merle, MD as Consulting Physician (Hematology) Stark Klein, MD as Consulting Physician (General Surgery) Eppie Gibson, MD as Attending Physician (Radiation Oncology) Alla Feeling, NP as Nurse Practitioner (Nurse Practitioner) 11/10/2021  CHIEF COMPLAINT: Follow up right breast cancer   SUMMARY OF ONCOLOGIC HISTORY: Oncology History Overview Note  Cancer Staging Malignant neoplasm of 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, estrogen receptor positive ([place]) Staging form: Breast, AJCC 8th Edition - Clinical stage from 02/16/2018: Stage IB (cT2(m), cN0, cM0, G2, ER+, PR+, HER2-) - Signed by Truitt Merle, MD on 03/11/2018    Malignant neoplasm of 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, estrogen receptor positive ([place])  01/30/2018 Mammogram   Screening mammogram FINDINGS: In the right breast, possible distortion warrants further evaluation. The patient has had an excisional biopsy of her right breast upper outer quadrant in 2007, however this distortion is either better seen or new from her prior mammogram, and it does not quite match the surgical site as marked by a skin scar marker. In the left breast, no findings suspicious for malignancy.   02/14/2018 Mammogram   Diagnostic mammogram: In the lateral aspect of the right breast, posterior depth there is a prominent area of distortion at the approximate 9 o'clock location. While this is in the region of the excisional biopsy that the patient had in 2007, the distortion has become significantly more prominent as compared to the tomosynthesis mammogram performed in September of 2016, and appears to be inferior to the surgical scar.   02/14/2018 Breast US   Ultrasound of the right breast at 8:30, 4 cm from the nipple demonstrates an irregular hypoechoic  vascular mass with spiculated margins measuring 2.1 x 2.0 x 1.8 cm. There is a small adjacent mass at 9 o'clock, 4 cm from the nipple, which is 1.2 cm away from the dominant mass. The smaller mass measures 1.1 x 0.7 x 0.7 cm. Together, the 2 masses span 3.6 cm of tissue. Ultrasound of the right axilla demonstrates multiple normal-appearing lymph nodes.   IMPRESSION: 1. There is a highly suspicious mass in the right breast at 830 with a small possible satellite lesion at 9 o'clock. 2.  No evidence of right axillary lymphadenopathy.   02/16/2018 Cancer Staging   Staging form: Breast, AJCC 8th Edition - Clinical stage from 02/16/2018: Stage IB (cT2(m), cN0, cM0, G2, ER+, PR+, HER2-) - Signed by Truitt Merle, MD on 03/11/2018    02/16/2018 Initial Biopsy   Diagnosis 1. Breast, right, needle core biopsy, 8:30 o'clock - INVASIVE DUCTAL CARCINOMA, SEE COMMENT. 2. Breast, right, needle core biopsy, 9 o'clock - INVASIVE DUCTAL CARCINOMA. - LOBULAR NEOPLASIA (ATYPICAL LOBULAR HYPERPLASIA).  ER: 95%, positive, strong staining PR: 80%, positive, moderate staining  Proliferation Marker Ki67: 2%  HER2 - Negative Ratio of HER2/CEP17 signals: 1.80 Average HER2 copy number per cell: 2.97    03/01/2018 Imaging   MR Bilateral breast IMPRESSION: Two areas of known malignancy in the LATERAL portion of the RIGHT breast. No additional areas of concern in either breast. No axillary adenopathy.   03/11/2018 Initial Diagnosis   Malignant neoplasm of 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, estrogen receptor positive ([place])   04/05/2018 Surgery   RIGHT BREAST SEED BRACKETED LUMPECTOMY WITH SENTINEL LYMPH NODE BIOPSY by Dr. Barry Dienes 04/05/18   04/05/2018 Pathology Results  Surgery  Diagnosis 04/05/18  1. Breast, lumpectomy, right - INVASIVE DUCTAL CARCINOMA GRADE I/III, TWO FOCI SPANNING 2.9 CM AND 0.9 CM. - LOBULAR NEOPLASIA (LOBULAR CARCINOMA IN SITU). - DUCTAL CARCINOMA IN SITU, LOW GRADE. -  LYMPHOVASCULAR INVASION IS IDENTIFIED. - THE SURGICAL RESECTION MARGINS ARE NEGATIVE FOR DUCTAL CARCINOMA. - SEE ONCOLOGY TABLE BELOW. 2. Lymph node, sentinel, biopsy, right - THERE IS NO EVIDENCE OF CARCINOMA IN 1 OF 1 LYMPH NODE (0/1). 3. Lymph node, sentinel, biopsy, right - METASTATIC CARCINOMA IN 1 OF 1 LYMPH NODE (1/1) WITH EXTRACAPSULAR EXTENSION. - EXTRACAPSULAR EXTENSION EXTENDS 0.3 CM. 4. Lymph node, sentinel, biopsy, right - DUCTAL CARCINOMA. - PERINEURAL INVASION IS IDENTIFIED. - SEE COMMENT. 5. Lymph node, sentinel, biopsy, right 1 of 4   04/05/2018 Miscellaneous   Mammaprint Low Risk with 10% risk of recurrance with Tamoxifen alone  MPI at +0.240 DMFI at 97.8%   04/12/2018 Cancer Staging   Staging form: Breast, AJCC 8th Edition - Pathologic: Stage IA (pT2, pN1a, cM0, G1, ER+, PR+, HER2-) - Signed by Truitt Merle, MD on 04/12/2018    04/13/2018 Imaging   Whole Body Bone scan 04/13/18 IMPRESSION: No scintigraphic evidence of osseous metastatic disease.   04/16/2018 Imaging   CT CAP W Contrast 04/16/18  IMPRESSION: 1. No definite findings of metastatic disease in the chest. Solitary solid 4 mm right lower lobe pulmonary nodule, for which initial chest CT follow-up is advised in 3 months. 2. Postsurgical changes from right breast lumpectomy and right axillary node dissection. Thin-walled fluid collections in the right breast and right axilla are most compatible with postsurgical seromas. 3. No evidence of metastatic disease in the abdomen or pelvis.    04/17/2018 Surgery   AXILLARY LYMPH NODE DISSECTION by Dr. Barry Dienes 04/17/18     04/17/2018 Pathology Results   Diagnosis 04/17/18  Lymph nodes, regional resection, Right Axillary - BENIGN FIBROADIPOSE AND BREAST TISSUE WITH FAT NECROSIS. - NO LYMPHOID TISSUE PRESENT. - NO MALIGNANCY IDENTIFIED. Microscopic Comment The entire specimen was submitted.   05/28/2018 - 07/10/2018 Radiation Therapy   Radiation treatment  dates:   05/28/2018-07/10/2018   Site/dose:    1. Right breast, 2 Gy in 25 fractions for a total dose of 50 Gy 2. Right SCV, 2 Gy in 25 fractions for a total dose of 50 Gy 3. Right breast boost, 2 Gy in 5 fractions for a total dose of 5 Gy           07/24/2018 -  Anti-estrogen oral therapy   Letrozole 2.8m daily starting 07/24/18    Survivorship   Per LCira Rue NP      CURRENT THERAPY: Letrozole 2.5 mg daily, starting 07/2018  INTERVAL HISTORY: Ms. HLorenda Hatchetreturns for follow up as scheduled. Last seen by Dr. FBurr Medico7/25/22   REVIEW OF SYSTEMS:   Constitutional: Denies fevers, chills or abnormal weight loss Eyes: Denies blurriness of vision Ears, nose, mouth, throat, and face: Denies mucositis or sore throat Respiratory: Denies cough, dyspnea or wheezes Cardiovascular: Denies palpitation, chest discomfort or lower extremity swelling Gastrointestinal:  Denies nausea, heartburn or change in bowel habits Skin: Denies abnormal skin rashes Lymphatics: Denies new lymphadenopathy or easy bruising Neurological:Denies numbness, tingling or new weaknesses Behavioral/Psych: Mood is stable, no new changes  All other systems were reviewed with the patient and are negative.  MEDICAL HISTORY:  Past Medical History:  Diagnosis Date   Anemia    Anxiety and depression 10/15/2014   Arthritis    BILATERAL KNEES, HAD CORTISONE INJECTION  03/2017   Brain cancer ([facility])    Breast cancer ([facility]) 2019   Right Breast Cancer   Depression    GERD (gastroesophageal reflux disease)    OCCASIONAL WITH CERTAIN FOOD   Headache    Heart murmur    History of radiation therapy 05/28/18- 07/10/18   Right Breast 25 fractions for a total dose of 50 Gy, Right SCV and PAB 25 fractions for a total dose of 50 Gy, right breast boost, 5 fractions for a total dose of 10 Gy.    Personal history of radiation therapy    Right Breast Cancer   Seasonal allergies    SVD (spontaneous vaginal delivery)    x 4   Unspecified  hereditary and idiopathic peripheral neuropathy 05/14/2014   LEFT KNEE AND FOOT    SURGICAL HISTORY: Past Surgical History:  Procedure Laterality Date   ABDOMINAL HYSTERECTOMY     AXILLARY LYMPH NODE DISSECTION Right 04/17/2018   Procedure: AXILLARY LYMPH NODE DISSECTION;  Surgeon: Stark Klein, MD;  Location: [facility];  Service: General;  Laterality: Right;   BREAST EXCISIONAL BIOPSY Right 2007   BREAST LUMPECTOMY Right 2019   BREAST LUMPECTOMY WITH RADIOACTIVE SEED AND SENTINEL LYMPH NODE BIOPSY Right 04/05/2018   Procedure: RIGHT BREAST SEED BRACKETED LUMPECTOMY WITH SENTINEL LYMPH NODE BIOPSY;  Surgeon: Stark Klein, MD;  Location: [facility];  Service: General;  Laterality: Right;   BREAST SURGERY     bx, no cancer    CATARACT EXTRACTION Bilateral    DILATATION & CURETTAGE/HYSTEROSCOPY WITH MYOSURE N/A 08/09/2017   Procedure: [place];  Surgeon: Emily Filbert, MD;  Location: [facility];  Service: Gynecology;  Laterality: N/A;   LABIOPLASTY  03/06/2018   Procedure: LABIAPLASTY;  Surgeon: Emily Filbert, MD;  Location: [facility];  Service: Gynecology;;   TOTAL KNEE ARTHROPLASTY Right 09/14/2020   Procedure: RIGHT TOTAL KNEE ARTHROPLASTY;  Surgeon: Leandrew Koyanagi, MD;  Location: [facility];  Service: Orthopedics;  Laterality: Right;   VAGINAL HYSTERECTOMY Bilateral 03/06/2018   Procedure: HYSTERECTOMY VAGINAL WITH BILATERAL SALPINGOOPHORECTOMY;  Surgeon: Emily Filbert, MD;  Location: [facility];  Service: Gynecology;  Laterality: Bilateral;    I have reviewed the social history and family history with the patient and they are unchanged from previous note.  ALLERGIES:  has No Known Allergies.  MEDICATIONS:  Current Outpatient Medications  Medication Sig Dispense Refill   amitriptyline (ELAVIL) 25 MG tablet TAKE 2 TABLETS(50 MG) BY MOUTH AT BEDTIME. MAY INCREASE TO 75 MG AT BEDTIME AFTER 2 WEEKS 60 tablet 3   aspirin EC 81 MG tablet  Take 1 tablet (81 mg total) by mouth 2 (two) times daily. To be taken after surgery to prevent blood clots 84 tablet 0   b complex vitamins tablet Take 1 tablet by mouth daily.     cephALEXin (KEFLEX) 500 MG capsule Take 1 capsule (500 mg total) by mouth 4 (four) times daily. 40 capsule 0   cholecalciferol (VITAMIN D3) 25 MCG (1000 UNIT) tablet Take 1,000 Units by mouth daily.     ferrous sulfate 325 (65 FE) MG EC tablet Take 325 mg by mouth 3 (three) times daily with meals. (Patient not taking: Reported on 09/09/2020)     FLUoxetine (PROZAC) 40 MG capsule Take 40 mg by mouth every morning.     gabapentin (NEURONTIN) 300 MG capsule Take 1 capsule (300 mg total) by mouth 3 (three) times daily. 90 capsule 3   hydrOXYzine (  ATARAX/VISTARIL) 10 MG tablet Take 10 mg by mouth 3 (three) times daily as needed for anxiety.      letrozole (FEMARA) 2.5 MG tablet Take 1 tablet (2.5 mg total) by mouth daily. 90 tablet 3   methocarbamol (ROBAXIN) 500 MG tablet Take 1 tablet (500 mg total) by mouth 2 (two) times daily as needed. To be taken after surgery as needed 20 tablet 0   omeprazole (PRILOSEC) 40 MG capsule TAKE 1 CAPSULE(40 MG) BY MOUTH TWICE DAILY FOR 10 DAYS (Patient not taking: Reported on 09/09/2020) 20 capsule 0   ondansetron (ZOFRAN) 4 MG tablet Take 1 tablet (4 mg total) by mouth every 8 (eight) hours as needed for nausea or vomiting. 40 tablet 0   oxyCODONE-acetaminophen (PERCOCET) 5-325 MG tablet Take 1-2 tablets by mouth every 6 (six) hours as needed. 40 tablet 0   promethazine (PHENERGAN) 25 MG tablet Take 1 tablet (25 mg total) by mouth every 6 (six) hours as needed for nausea. 30 tablet 1   traZODone (DESYREL) 100 MG tablet Take 100 mg by mouth at bedtime.     vitamin B-12 (CYANOCOBALAMIN) 500 MCG tablet Take 500 mcg by mouth daily.     No current facility-administered medications for this visit.    PHYSICAL EXAMINATION: ECOG PERFORMANCE STATUS: {CHL ONC ECOG [phone]}  There were no  vitals filed for this visit. There were no vitals filed for this visit.  GENERAL:alert, no distress and comfortable SKIN: skin color, texture, turgor are normal, no rashes or significant lesions EYES: normal, Conjunctiva are pink and non-injected, sclera clear OROPHARYNX:no exudate, no erythema and lips, buccal mucosa, and tongue normal  NECK: supple, thyroid normal size, non-tender, without nodularity LYMPH:  no palpable lymphadenopathy in the cervical, axillary or inguinal LUNGS: clear to auscultation and percussion with normal breathing effort HEART: regular rate & rhythm and no murmurs and no lower extremity edema ABDOMEN:abdomen soft, non-tender and normal bowel sounds Musculoskeletal:no cyanosis of digits and no clubbing  NEURO: alert & oriented x 3 with fluent speech, no focal motor/sensory deficits  LABORATORY DATA:  I have reviewed the data as listed CBC Latest Ref Rng & Units 05/17/2021 11/16/2020 09/15/2020  WBC 4.0 - 10.5 K/uL 5.5 2.9(L) 10.7(H)  Hemoglobin 12.0 - 15.0 g/dL 11.9(L) 10.8(L) 10.5(L)  Hematocrit 36.0 - 46.0 % 36.6 34.9(L) 32.9(L)  Platelets 150 - 400 K/uL 266 203 211     CMP Latest Ref Rng & Units 05/17/2021 11/16/2020 09/15/2020  Glucose 70 - 99 mg/dL 124(H) 83 117(H)  BUN 8 - 23 mg/dL 31(H) 20 17  Creatinine 0.44 - 1.00 mg/dL 1.12(H) 1.01(H) 1.03(H)  Sodium 135 - 145 mmol/L 140 141 139  Potassium 3.5 - 5.1 mmol/L 4.9 4.1 5.6(H)  Chloride 98 - 111 mmol/L 109 107 104  CO2 22 - 32 mmol/L '22 24 25  ' Calcium 8.9 - 10.3 mg/dL 9.7 9.2 9.3  Total Protein 6.5 - 8.1 g/dL 7.0 6.5 -  Total Bilirubin 0.3 - 1.2 mg/dL 0.3 0.2(L) -  Alkaline Phos 38 - 126 U/L 148(H) 110 -  AST 15 - 41 U/L 21 18 -  ALT 0 - 44 U/L 16 10 -      RADIOGRAPHIC STUDIES: I have personally reviewed the radiological images as listed and agreed with the findings in the report. No results found.   ASSESSMENT & PLAN:  No problem-specific Assessment & Plan notes found for this  encounter.   No orders of the defined types were placed in this encounter.  All  questions were answered. The patient knows to call the clinic with any problems, questions or concerns. No barriers to learning was detected. I spent {CHL ONC TIME VISIT - UOHFG:[id number]} counseling the patient face to face. The total time spent in the appointment was {CHL ONC TIME VISIT - EYEMV:[id number]} and more than 50% was on counseling and review of test results     Alla Feeling, NP 11/10/21

## 2021-11-16 ENCOUNTER — Encounter: Payer: Self-pay | Admitting: Nurse Practitioner

## 2021-11-16 ENCOUNTER — Ambulatory Visit
Admission: RE | Admit: 2021-11-16 | Discharge: 2021-11-16 | Disposition: A | Discharge status: Home / Self Care | Payer: Commercial Managed Care - HMO | Source: Ambulatory Visit | Attending: Hematology | Admitting: Hematology

## 2021-11-16 DIAGNOSIS — E2839 Other primary ovarian failure: Secondary | ICD-10-CM

## 2021-11-17 ENCOUNTER — Ambulatory Visit: Payer: Medicare Other | Admitting: Nurse Practitioner

## 2021-11-17 ENCOUNTER — Other Ambulatory Visit: Payer: Medicare Other

## 2021-11-24 ENCOUNTER — Encounter: Payer: Self-pay | Admitting: Nurse Practitioner

## 2021-11-25 ENCOUNTER — Inpatient Hospital Stay: Payer: Medicare Other | Attending: Nurse Practitioner

## 2021-11-25 ENCOUNTER — Inpatient Hospital Stay: Payer: Medicare Other | Admitting: Nurse Practitioner

## 2021-11-25 DIAGNOSIS — M25562 Pain in left knee: Secondary | ICD-10-CM | POA: Insufficient documentation

## 2021-11-25 DIAGNOSIS — Z79899 Other long term (current) drug therapy: Secondary | ICD-10-CM | POA: Insufficient documentation

## 2021-11-25 DIAGNOSIS — M170 Bilateral primary osteoarthritis of knee: Secondary | ICD-10-CM | POA: Insufficient documentation

## 2021-11-25 DIAGNOSIS — C50511 Malignant neoplasm of lower-outer quadrant of right female breast: Secondary | ICD-10-CM | POA: Insufficient documentation

## 2021-11-25 DIAGNOSIS — F419 Anxiety disorder, unspecified: Secondary | ICD-10-CM | POA: Insufficient documentation

## 2021-11-25 DIAGNOSIS — G629 Polyneuropathy, unspecified: Secondary | ICD-10-CM | POA: Insufficient documentation

## 2021-11-25 DIAGNOSIS — Z170 Estrogen receptor positive status [ER+]: Secondary | ICD-10-CM | POA: Insufficient documentation

## 2021-11-25 DIAGNOSIS — F32A Depression, unspecified: Secondary | ICD-10-CM | POA: Insufficient documentation

## 2021-11-25 DIAGNOSIS — Z79811 Long term (current) use of aromatase inhibitors: Secondary | ICD-10-CM | POA: Insufficient documentation

## 2021-11-25 DIAGNOSIS — D519 Vitamin B12 deficiency anemia, unspecified: Secondary | ICD-10-CM | POA: Insufficient documentation

## 2021-11-25 DIAGNOSIS — M810 Age-related osteoporosis without current pathological fracture: Secondary | ICD-10-CM | POA: Insufficient documentation

## 2021-11-25 DIAGNOSIS — Z923 Personal history of irradiation: Secondary | ICD-10-CM | POA: Insufficient documentation

## 2021-11-25 NOTE — Progress Notes (Deleted)
[name]   Telephone:([phone]) [phone] Fax:(336) [phone]   Clinic Follow up Note   Patient Care Team: Lin Landsman, MD as PCP - General (Family Medicine) Truitt Merle, MD as Consulting Physician (Hematology) Stark Klein, MD as Consulting Physician (General Surgery) Eppie Gibson, MD as Attending Physician (Radiation Oncology) Alla Feeling, NP as Nurse Practitioner (Nurse Practitioner) 11/25/2021  CHIEF COMPLAINT: Follow up right breast cancer   SUMMARY OF ONCOLOGIC HISTORY: Oncology History Overview Note  Cancer Staging Malignant neoplasm of 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, estrogen receptor positive (Amy Roman) Staging form: Breast, AJCC 8th Edition - Clinical stage from 02/16/2018: Stage IB (cT2(m), cN0, cM0, G2, ER+, PR+, HER2-) - Signed by Truitt Merle, MD on 03/11/2018    Malignant neoplasm of 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, estrogen receptor positive ([name])  01/30/2018 Mammogram   Screening mammogram FINDINGS: In the right breast, possible distortion warrants further evaluation. The patient has had an excisional biopsy of her right breast upper outer quadrant in 2007, however this distortion is either better seen or new from her prior mammogram, and it does not quite match the surgical site as marked by a skin scar marker. In the left breast, no findings suspicious for malignancy.   02/14/2018 Mammogram   Diagnostic mammogram: In the lateral aspect of the right breast, posterior depth there is a prominent area of distortion at the approximate 9 o'clock location. While this is in the region of the excisional biopsy that the patient had in 2007, the distortion has become significantly more prominent as compared to the tomosynthesis mammogram performed in September of 2016, and appears to be inferior to the surgical scar.   02/14/2018 Breast US   Ultrasound of the right breast at 8:30, 4 cm from the nipple demonstrates an irregular hypoechoic  vascular mass with spiculated margins measuring 2.1 x 2.0 x 1.8 cm. There is a small adjacent mass at 9 o'clock, 4 cm from the nipple, which is 1.2 cm away from the dominant mass. The smaller mass measures 1.1 x 0.7 x 0.7 cm. Together, the 2 masses span 3.6 cm of tissue. Ultrasound of the right axilla demonstrates multiple normal-appearing lymph nodes.   IMPRESSION: 1. There is a highly suspicious mass in the right breast at 830 with a small possible satellite lesion at 9 o'clock. 2.  No evidence of right axillary lymphadenopathy.   02/16/2018 Cancer Staging   Staging form: Breast, AJCC 8th Edition - Clinical stage from 02/16/2018: Stage IB (cT2(m), cN0, cM0, G2, ER+, PR+, HER2-) - Signed by Truitt Merle, MD on 03/11/2018    02/16/2018 Initial Biopsy   Diagnosis 1. Breast, right, needle core biopsy, 8:30 o'clock - INVASIVE DUCTAL CARCINOMA, SEE COMMENT. 2. Breast, right, needle core biopsy, 9 o'clock - INVASIVE DUCTAL CARCINOMA. - LOBULAR NEOPLASIA (ATYPICAL LOBULAR HYPERPLASIA).  ER: 95%, positive, strong staining PR: 80%, positive, moderate staining  Proliferation Marker Ki67: 2%  HER2 - Negative Ratio of HER2/CEP17 signals: 1.80 Average HER2 copy number per cell: 2.97    03/01/2018 Imaging   MR Bilateral breast IMPRESSION: Two areas of known malignancy in the LATERAL portion of the RIGHT breast. No additional areas of concern in either breast. No axillary adenopathy.   03/11/2018 Initial Diagnosis   Malignant neoplasm of 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, estrogen receptor positive (Amy Roman)   04/05/2018 Surgery   RIGHT BREAST SEED BRACKETED LUMPECTOMY WITH SENTINEL LYMPH NODE BIOPSY by Dr. Barry Dienes 04/05/18   04/05/2018 Pathology Results  Surgery  Diagnosis 04/05/18  1. Breast, lumpectomy, right - INVASIVE DUCTAL CARCINOMA GRADE I/III, TWO FOCI SPANNING 2.9 CM AND 0.9 CM. - LOBULAR NEOPLASIA (LOBULAR CARCINOMA IN SITU). - DUCTAL CARCINOMA IN SITU, LOW GRADE. -  LYMPHOVASCULAR INVASION IS IDENTIFIED. - THE SURGICAL RESECTION MARGINS ARE NEGATIVE FOR DUCTAL CARCINOMA. - SEE ONCOLOGY TABLE BELOW. 2. Lymph node, sentinel, biopsy, right - THERE IS NO EVIDENCE OF CARCINOMA IN 1 OF 1 LYMPH NODE (0/1). 3. Lymph node, sentinel, biopsy, right - METASTATIC CARCINOMA IN 1 OF 1 LYMPH NODE (1/1) WITH EXTRACAPSULAR EXTENSION. - EXTRACAPSULAR EXTENSION EXTENDS 0.3 CM. 4. Lymph node, sentinel, biopsy, right - DUCTAL CARCINOMA. - PERINEURAL INVASION IS IDENTIFIED. - SEE COMMENT. 5. Lymph node, sentinel, biopsy, right 1 of 4   04/05/2018 Miscellaneous   Mammaprint Low Risk with 10% risk of recurrance with Tamoxifen alone  MPI at +0.240 DMFI at 97.8%   04/12/2018 Cancer Staging   Staging form: Breast, AJCC 8th Edition - Pathologic: Stage IA (pT2, pN1a, cM0, G1, ER+, PR+, HER2-) - Signed by Truitt Merle, MD on 04/12/2018    04/13/2018 Imaging   Whole Body Bone scan 04/13/18 IMPRESSION: No scintigraphic evidence of osseous metastatic disease.   04/16/2018 Imaging   CT CAP W Contrast 04/16/18  IMPRESSION: 1. No definite findings of metastatic disease in the chest. Solitary solid 4 mm right lower lobe pulmonary nodule, for which initial chest CT follow-up is advised in 3 months. 2. Postsurgical changes from right breast lumpectomy and right axillary node dissection. Thin-walled fluid collections in the right breast and right axilla are most compatible with postsurgical seromas. 3. No evidence of metastatic disease in the abdomen or pelvis.    04/17/2018 Surgery   AXILLARY LYMPH NODE DISSECTION by Dr. Barry Dienes 04/17/18     04/17/2018 Pathology Results   Diagnosis 04/17/18  Lymph nodes, regional resection, Right Axillary - BENIGN FIBROADIPOSE AND BREAST TISSUE WITH FAT NECROSIS. - NO LYMPHOID TISSUE PRESENT. - NO MALIGNANCY IDENTIFIED. Microscopic Comment The entire specimen was submitted.   05/28/2018 - 07/10/2018 Radiation Therapy   Radiation treatment  dates:   05/28/2018-07/10/2018   Site/dose:    1. Right breast, 2 Gy in 25 fractions for a total dose of 50 Gy 2. Right SCV, 2 Gy in 25 fractions for a total dose of 50 Gy 3. Right breast boost, 2 Gy in 5 fractions for a total dose of 5 Gy           07/24/2018 -  Anti-estrogen oral therapy   Letrozole 2.49m daily starting 07/24/18    Survivorship   Per LCira Rue NP      CURRENT THERAPY: Letrozole 2.5 mg daily, starting 07/2018  INTERVAL HISTORY: Ms. Amy Hatchetreturns for follow up as scheduled. Last seen by Dr. FBurr Medico7/25/22. Mammogram 07/02/21 showed breast density cat C and no evidence of malignancy. DEXA 11/16/21 showed T score -2.5 at the spine, she is considered osteoporotic. Fracture risk score was not measured.    REVIEW OF SYSTEMS:   Constitutional: Denies fevers, chills or abnormal weight loss Eyes: Denies blurriness of vision Ears, nose, mouth, throat, and face: Denies mucositis or sore throat Respiratory: Denies cough, dyspnea or wheezes Cardiovascular: Denies palpitation, chest discomfort or lower extremity swelling Gastrointestinal:  Denies nausea, heartburn or change in bowel habits Skin: Denies abnormal skin rashes Lymphatics: Denies new lymphadenopathy or easy bruising Neurological:Denies numbness, tingling or new weaknesses Behavioral/Psych: Mood is stable, no new changes  All other systems were reviewed with the patient and are  negative.  MEDICAL HISTORY:  Past Medical History:  Diagnosis Date   Anemia    Anxiety and depression 10/15/2014   Arthritis    BILATERAL KNEES, HAD CORTISONE INJECTION 03/2017   Brain cancer ([place])    Breast cancer ([place]) 2019   Right Breast Cancer   Depression    GERD (gastroesophageal reflux disease)    OCCASIONAL WITH CERTAIN FOOD   Headache    Heart murmur    History of radiation therapy 05/28/18- 07/10/18   Right Breast 25 fractions for a total dose of 50 Gy, Right SCV and PAB 25 fractions for a total dose of 50 Gy, right breast  boost, 5 fractions for a total dose of 10 Gy.    Personal history of radiation therapy    Right Breast Cancer   Seasonal allergies    SVD (spontaneous vaginal delivery)    x 4   Unspecified hereditary and idiopathic peripheral neuropathy 05/14/2014   LEFT KNEE AND FOOT    SURGICAL HISTORY: Past Surgical History:  Procedure Laterality Date   ABDOMINAL HYSTERECTOMY     AXILLARY LYMPH NODE DISSECTION Right 04/17/2018   Procedure: AXILLARY LYMPH NODE DISSECTION;  Surgeon: Stark Klein, MD;  Location: [facility];  Service: General;  Laterality: Right;   BREAST EXCISIONAL BIOPSY Right 2007   BREAST LUMPECTOMY Right 2019   BREAST LUMPECTOMY WITH RADIOACTIVE SEED AND SENTINEL LYMPH NODE BIOPSY Right 04/05/2018   Procedure: RIGHT BREAST SEED BRACKETED LUMPECTOMY WITH SENTINEL LYMPH NODE BIOPSY;  Surgeon: Stark Klein, MD;  Location: [facility];  Service: General;  Laterality: Right;   BREAST SURGERY     bx, no cancer    CATARACT EXTRACTION Bilateral    DILATATION & CURETTAGE/HYSTEROSCOPY WITH MYOSURE N/A 08/09/2017   Procedure: [place];  Surgeon: Emily Filbert, MD;  Location: [facility];  Service: Gynecology;  Laterality: N/A;   LABIOPLASTY  03/06/2018   Procedure: LABIAPLASTY;  Surgeon: Emily Filbert, MD;  Location: [facility];  Service: Gynecology;;   TOTAL KNEE ARTHROPLASTY Right 09/14/2020   Procedure: RIGHT TOTAL KNEE ARTHROPLASTY;  Surgeon: Leandrew Koyanagi, MD;  Location: [facility];  Service: Orthopedics;  Laterality: Right;   VAGINAL HYSTERECTOMY Bilateral 03/06/2018   Procedure: HYSTERECTOMY VAGINAL WITH BILATERAL SALPINGOOPHORECTOMY;  Surgeon: Emily Filbert, MD;  Location: [facility];  Service: Gynecology;  Laterality: Bilateral;    I have reviewed the social history and family history with the patient and they are unchanged from previous note.  ALLERGIES:  has No Known Allergies.  MEDICATIONS:  Current Outpatient Medications   Medication Sig Dispense Refill   amitriptyline (ELAVIL) 25 MG tablet TAKE 2 TABLETS(50 MG) BY MOUTH AT BEDTIME. MAY INCREASE TO 75 MG AT BEDTIME AFTER 2 WEEKS 60 tablet 3   aspirin EC 81 MG tablet Take 1 tablet (81 mg total) by mouth 2 (two) times daily. To be taken after surgery to prevent blood clots 84 tablet 0   b complex vitamins tablet Take 1 tablet by mouth daily.     cephALEXin (KEFLEX) 500 MG capsule Take 1 capsule (500 mg total) by mouth 4 (four) times daily. 40 capsule 0   cholecalciferol (VITAMIN D3) 25 MCG (1000 UNIT) tablet Take 1,000 Units by mouth daily.     ferrous sulfate 325 (65 FE) MG EC tablet Take 325 mg by mouth 3 (three) times daily with meals. (Patient not taking: Reported on 09/09/2020)     FLUoxetine (PROZAC) 40 MG capsule Take 40  mg by mouth every morning.     gabapentin (NEURONTIN) 300 MG capsule Take 1 capsule (300 mg total) by mouth 3 (three) times daily. 90 capsule 3   hydrOXYzine (ATARAX/VISTARIL) 10 MG tablet Take 10 mg by mouth 3 (three) times daily as needed for anxiety.      letrozole (FEMARA) 2.5 MG tablet Take 1 tablet (2.5 mg total) by mouth daily. 90 tablet 3   methocarbamol (ROBAXIN) 500 MG tablet Take 1 tablet (500 mg total) by mouth 2 (two) times daily as needed. To be taken after surgery as needed 20 tablet 0   omeprazole (PRILOSEC) 40 MG capsule TAKE 1 CAPSULE(40 MG) BY MOUTH TWICE DAILY FOR 10 DAYS (Patient not taking: Reported on 09/09/2020) 20 capsule 0   ondansetron (ZOFRAN) 4 MG tablet Take 1 tablet (4 mg total) by mouth every 8 (eight) hours as needed for nausea or vomiting. 40 tablet 0   oxyCODONE-acetaminophen (PERCOCET) 5-325 MG tablet Take 1-2 tablets by mouth every 6 (six) hours as needed. 40 tablet 0   promethazine (PHENERGAN) 25 MG tablet Take 1 tablet (25 mg total) by mouth every 6 (six) hours as needed for nausea. 30 tablet 1   traZODone (DESYREL) 100 MG tablet Take 100 mg by mouth at bedtime.     vitamin B-12 (CYANOCOBALAMIN) 500 MCG  tablet Take 500 mcg by mouth daily.     No current facility-administered medications for this visit.    PHYSICAL EXAMINATION: ECOG PERFORMANCE STATUS: {CHL ONC ECOG PS:[id number]}  There were no vitals filed for this visit. There were no vitals filed for this visit.  GENERAL:alert, no distress and comfortable SKIN: skin color, texture, turgor are normal, no rashes or significant lesions EYES: normal, Conjunctiva are pink and non-injected, sclera clear OROPHARYNX:no exudate, no erythema and lips, buccal mucosa, and tongue normal  NECK: supple, thyroid normal size, non-tender, without nodularity LYMPH:  no palpable lymphadenopathy in the cervical, axillary or inguinal LUNGS: clear to auscultation and percussion with normal breathing effort HEART: regular rate & rhythm and no murmurs and no lower extremity edema ABDOMEN:abdomen soft, non-tender and normal bowel sounds Musculoskeletal:no cyanosis of digits and no clubbing  NEURO: alert & oriented x 3 with fluent speech, no focal motor/sensory deficits  LABORATORY DATA:  I have reviewed the data as listed CBC Latest Ref Rng & Units 05/17/2021 11/16/2020 09/15/2020  WBC 4.0 - 10.5 K/uL 5.5 2.9(L) 10.7(H)  Hemoglobin 12.0 - 15.0 g/dL 11.9(L) 10.8(L) 10.5(L)  Hematocrit 36.0 - 46.0 % 36.6 34.9(L) 32.9(L)  Platelets 150 - 400 K/uL 266 203 211     CMP Latest Ref Rng & Units 05/17/2021 11/16/2020 09/15/2020  Glucose 70 - 99 mg/dL 124(H) 83 117(H)  BUN 8 - 23 mg/dL 31(H) 20 17  Creatinine 0.44 - 1.00 mg/dL 1.12(H) 1.01(H) 1.03(H)  Sodium 135 - 145 mmol/L 140 141 139  Potassium 3.5 - 5.1 mmol/L 4.9 4.1 5.6(H)  Chloride 98 - 111 mmol/L 109 107 104  CO2 22 - 32 mmol/L _0 Calcium 8.9 - 10.3 mg/dL 9.7 9.2 9.3  Total Protein 6.5 - 8.1 g/dL 7.0 6.5 -  Total Bilirubin 0.3 - 1.2 mg/dL 0.3 0.2(L) -  Alkaline Phos 38 - 126 U/L 148(H) 110 -  AST 15 - 41 U/L 21 18 -  ALT 0 - 44 U/L 16 10 -      RADIOGRAPHIC STUDIES: I have personally  reviewed the radiological images as listed and agreed with the findings in the report. No results  found.   ASSESSMENT & PLAN:  No problem-specific Assessment & Plan notes found for this encounter.   No orders of the defined types were placed in this encounter.  All questions were answered. The patient knows to call the clinic with any problems, questions or concerns. No barriers to learning was detected. I spent {CHL ONC TIME VISIT - [id number]} counseling the patient face to face. The total time spent in the appointment was {CHL ONC TIME VISIT - ZXAQW:[id number]} and more than 50% was on counseling and review of test results     Alla Feeling, NP 11/25/21

## 2021-11-30 ENCOUNTER — Telehealth: Payer: Self-pay | Admitting: Nurse Practitioner

## 2021-11-30 NOTE — Telephone Encounter (Signed)
Sch per 2/7 inbasket, pt son aware

## 2021-12-07 ENCOUNTER — Other Ambulatory Visit: Payer: Self-pay

## 2021-12-07 ENCOUNTER — Inpatient Hospital Stay: Payer: Medicare Other

## 2021-12-07 ENCOUNTER — Inpatient Hospital Stay (HOSPITAL_BASED_OUTPATIENT_CLINIC_OR_DEPARTMENT_OTHER): Payer: Medicare Other | Admitting: Nurse Practitioner

## 2021-12-07 VITALS — BP 133/56 | HR 67 | Temp 97.8°F | Resp 18 | Ht 61.0 in | Wt 216.7 lb

## 2021-12-07 DIAGNOSIS — F32A Depression, unspecified: Secondary | ICD-10-CM | POA: Diagnosis not present

## 2021-12-07 DIAGNOSIS — G629 Polyneuropathy, unspecified: Secondary | ICD-10-CM | POA: Diagnosis not present

## 2021-12-07 DIAGNOSIS — M170 Bilateral primary osteoarthritis of knee: Secondary | ICD-10-CM | POA: Diagnosis not present

## 2021-12-07 DIAGNOSIS — C50511 Malignant neoplasm of lower-outer quadrant of right female breast: Secondary | ICD-10-CM

## 2021-12-07 DIAGNOSIS — Z79899 Other long term (current) drug therapy: Secondary | ICD-10-CM | POA: Diagnosis not present

## 2021-12-07 DIAGNOSIS — M816 Localized osteoporosis [Lequesne]: Secondary | ICD-10-CM

## 2021-12-07 DIAGNOSIS — M25562 Pain in left knee: Secondary | ICD-10-CM | POA: Diagnosis not present

## 2021-12-07 DIAGNOSIS — Z170 Estrogen receptor positive status [ER+]: Secondary | ICD-10-CM | POA: Diagnosis not present

## 2021-12-07 DIAGNOSIS — D519 Vitamin B12 deficiency anemia, unspecified: Secondary | ICD-10-CM | POA: Diagnosis not present

## 2021-12-07 DIAGNOSIS — D509 Iron deficiency anemia, unspecified: Secondary | ICD-10-CM

## 2021-12-07 DIAGNOSIS — Z923 Personal history of irradiation: Secondary | ICD-10-CM | POA: Diagnosis not present

## 2021-12-07 DIAGNOSIS — F419 Anxiety disorder, unspecified: Secondary | ICD-10-CM | POA: Diagnosis not present

## 2021-12-07 DIAGNOSIS — M810 Age-related osteoporosis without current pathological fracture: Secondary | ICD-10-CM | POA: Diagnosis not present

## 2021-12-07 DIAGNOSIS — Z79811 Long term (current) use of aromatase inhibitors: Secondary | ICD-10-CM | POA: Diagnosis not present

## 2021-12-07 LAB — CBC WITH DIFFERENTIAL (CANCER CENTER ONLY)
Abs Immature Granulocytes: 0.02 10*3/uL (ref 0.00–0.07)
Basophils Absolute: 0 10*3/uL (ref 0.0–0.1)
Basophils Relative: 1 %
Eosinophils Absolute: 0.1 10*3/uL (ref 0.0–0.5)
Eosinophils Relative: 3 %
HCT: 37.7 % (ref 36.0–46.0)
Hemoglobin: 12.4 g/dL (ref 12.0–15.0)
Immature Granulocytes: 0 %
Lymphocytes Relative: 26 %
Lymphs Abs: 1.2 10*3/uL (ref 0.7–4.0)
MCH: 30 pg (ref 26.0–34.0)
MCHC: 32.9 g/dL (ref 30.0–36.0)
MCV: 91.1 fL (ref 80.0–100.0)
Monocytes Absolute: 0.7 10*3/uL (ref 0.1–1.0)
Monocytes Relative: 16 %
Neutro Abs: 2.6 10*3/uL (ref 1.7–7.7)
Neutrophils Relative %: 54 %
Platelet Count: 254 10*3/uL (ref 150–400)
RBC: 4.14 MIL/uL (ref 3.87–5.11)
RDW: 14.9 % (ref 11.5–15.5)
WBC Count: 4.8 10*3/uL (ref 4.0–10.5)
nRBC: 0 % (ref 0.0–0.2)

## 2021-12-07 LAB — CMP (CANCER CENTER ONLY)
ALT: 17 U/L (ref 0–44)
AST: 25 U/L (ref 15–41)
Albumin: 3.6 g/dL (ref 3.5–5.0)
Alkaline Phosphatase: 123 U/L (ref 38–126)
Anion gap: 13 (ref 5–15)
BUN: 27 mg/dL — ABNORMAL HIGH (ref 8–23)
CO2: 21 mmol/L — ABNORMAL LOW (ref 22–32)
Calcium: 9.6 mg/dL (ref 8.9–10.3)
Chloride: 103 mmol/L (ref 98–111)
Creatinine: 1.02 mg/dL — ABNORMAL HIGH (ref 0.44–1.00)
GFR, Estimated: 59 mL/min — ABNORMAL LOW (ref >60)
Glucose, Bld: 89 mg/dL (ref 70–99)
Potassium: 5.3 mmol/L — ABNORMAL HIGH (ref 3.5–5.1)
Sodium: 137 mmol/L (ref 135–145)
Total Bilirubin: 0.2 mg/dL — ABNORMAL LOW (ref 0.3–1.2)
Total Protein: 6.4 g/dL — ABNORMAL LOW (ref 6.5–8.1)

## 2021-12-07 LAB — FERRITIN: Ferritin: 33 ng/mL (ref 11–307)

## 2021-12-07 MED ORDER — TAMOXIFEN CITRATE 20 MG PO TABS: 20.0000 mg | ORAL_TABLET | Freq: Every day | ORAL | 3 refills | Status: DC

## 2021-12-07 NOTE — Progress Notes (Signed)
[name]   Telephone:(336) [phone] Fax:(336) [phone]   Clinic Follow up Note   Patient Care Team: Lin Landsman, MD as PCP - General (Family Medicine) Truitt Merle, MD as Consulting Physician (Hematology) Stark Klein, MD as Consulting Physician (General Surgery) Eppie Gibson, MD as Attending Physician (Radiation Oncology) Alla Feeling, NP as Nurse Practitioner (Nurse Practitioner) Date of Service: 12/07/21  CHIEF COMPLAINT: Follow-up right breast cancer  SUMMARY OF ONCOLOGIC HISTORY: Oncology History Overview Note  Cancer Staging Malignant neoplasm of 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, estrogen receptor positive ([place]) Staging form: Breast, AJCC 8th Edition - Clinical stage from 02/16/2018: Stage IB (cT2(m), cN0, cM0, G2, ER+, PR+, HER2-) - Signed by Truitt Merle, MD on 03/11/2018    Malignant neoplasm of 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, estrogen receptor positive ([place])  01/30/2018 Mammogram   Screening mammogram FINDINGS: In the right breast, possible distortion warrants further evaluation. The patient has had an excisional biopsy of her right breast upper outer quadrant in 2007, however this distortion is either better seen or new from her prior mammogram, and it does not quite match the surgical site as marked by a skin scar marker. In the left breast, no findings suspicious for malignancy.   02/14/2018 Mammogram   Diagnostic mammogram: In the lateral aspect of the right breast, posterior depth there is a prominent area of distortion at the approximate 9 o'clock location. While this is in the region of the excisional biopsy that the patient had in 2007, the distortion has become significantly more prominent as compared to the tomosynthesis mammogram performed in September of 2016, and appears to be inferior to the surgical scar.   02/14/2018 Breast US   Ultrasound of the right breast at 8:30, 4 cm from the nipple demonstrates an irregular  hypoechoic vascular mass with spiculated margins measuring 2.1 x 2.0 x 1.8 cm. There is a small adjacent mass at 9 o'clock, 4 cm from the nipple, which is 1.2 cm away from the dominant mass. The smaller mass measures 1.1 x 0.7 x 0.7 cm. Together, the 2 masses span 3.6 cm of tissue. Ultrasound of the right axilla demonstrates multiple normal-appearing lymph nodes.   IMPRESSION: 1. There is a highly suspicious mass in the right breast at 830 with a small possible satellite lesion at 9 o'clock. 2.  No evidence of right axillary lymphadenopathy.   02/16/2018 Cancer Staging   Staging form: Breast, AJCC 8th Edition - Clinical stage from 02/16/2018: Stage IB (cT2(m), cN0, cM0, G2, ER+, PR+, HER2-) - Signed by Truitt Merle, MD on 03/11/2018    02/16/2018 Initial Biopsy   Diagnosis 1. Breast, right, needle core biopsy, 8:30 o'clock - INVASIVE DUCTAL CARCINOMA, SEE COMMENT. 2. Breast, right, needle core biopsy, 9 o'clock - INVASIVE DUCTAL CARCINOMA. - LOBULAR NEOPLASIA (ATYPICAL LOBULAR HYPERPLASIA).  ER: 95%, positive, strong staining PR: 80%, positive, moderate staining  Proliferation Marker Ki67: 2%  HER2 - Negative Ratio of HER2/CEP17 signals: 1.80 Average HER2 copy number per cell: 2.97    03/01/2018 Imaging   MR Bilateral breast IMPRESSION: Two areas of known malignancy in the LATERAL portion of the RIGHT breast. No additional areas of concern in either breast. No axillary adenopathy.   03/11/2018 Initial Diagnosis   Malignant neoplasm of 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, estrogen receptor positive ([place])   04/05/2018 Surgery   RIGHT BREAST SEED BRACKETED LUMPECTOMY WITH SENTINEL LYMPH NODE BIOPSY by Dr. Barry Dienes 04/05/18   04/05/2018 Pathology Results  Surgery  Diagnosis 04/05/18  1. Breast, lumpectomy, right - INVASIVE DUCTAL CARCINOMA GRADE I/III, TWO FOCI SPANNING 2.9 CM AND 0.9 CM. - LOBULAR NEOPLASIA (LOBULAR CARCINOMA IN SITU). - DUCTAL CARCINOMA IN SITU, LOW GRADE. -  LYMPHOVASCULAR INVASION IS IDENTIFIED. - THE SURGICAL RESECTION MARGINS ARE NEGATIVE FOR DUCTAL CARCINOMA. - SEE ONCOLOGY TABLE BELOW. 2. Lymph node, sentinel, biopsy, right - THERE IS NO EVIDENCE OF CARCINOMA IN 1 OF 1 LYMPH NODE (0/1). 3. Lymph node, sentinel, biopsy, right - METASTATIC CARCINOMA IN 1 OF 1 LYMPH NODE (1/1) WITH EXTRACAPSULAR EXTENSION. - EXTRACAPSULAR EXTENSION EXTENDS 0.3 CM. 4. Lymph node, sentinel, biopsy, right - DUCTAL CARCINOMA. - PERINEURAL INVASION IS IDENTIFIED. - SEE COMMENT. 5. Lymph node, sentinel, biopsy, right 1 of 4   04/05/2018 Miscellaneous   Mammaprint Low Risk with 10% risk of recurrance with Tamoxifen alone  MPI at +0.240 DMFI at 97.8%   04/12/2018 Cancer Staging   Staging form: Breast, AJCC 8th Edition - Pathologic: Stage IA (pT2, pN1a, cM0, G1, ER+, PR+, HER2-) - Signed by Truitt Merle, MD on 04/12/2018    04/13/2018 Imaging   Whole Body Bone scan 04/13/18 IMPRESSION: No scintigraphic evidence of osseous metastatic disease.   04/16/2018 Imaging   CT CAP W Contrast 04/16/18  IMPRESSION: 1. No definite findings of metastatic disease in the chest. Solitary solid 4 mm right lower lobe pulmonary nodule, for which initial chest CT follow-up is advised in 3 months. 2. Postsurgical changes from right breast lumpectomy and right axillary node dissection. Thin-walled fluid collections in the right breast and right axilla are most compatible with postsurgical seromas. 3. No evidence of metastatic disease in the abdomen or pelvis.    04/17/2018 Surgery   AXILLARY LYMPH NODE DISSECTION by Dr. Barry Dienes 04/17/18     04/17/2018 Pathology Results   Diagnosis 04/17/18  Lymph nodes, regional resection, Right Axillary - BENIGN FIBROADIPOSE AND BREAST TISSUE WITH FAT NECROSIS. - NO LYMPHOID TISSUE PRESENT. - NO MALIGNANCY IDENTIFIED. Microscopic Comment The entire specimen was submitted.   05/28/2018 - 07/10/2018 Radiation Therapy   Radiation treatment  dates:   05/28/2018-07/10/2018   Site/dose:    1. Right breast, 2 Gy in 25 fractions for a total dose of 50 Gy 2. Right SCV, 2 Gy in 25 fractions for a total dose of 50 Gy 3. Right breast boost, 2 Gy in 5 fractions for a total dose of 5 Gy           07/24/2018 -  Anti-estrogen oral therapy   Letrozole 2.41m daily starting 07/24/18    Survivorship   Per LCira Rue NP      CURRENT THERAPY: Letrozole starting 07/2018, goal is 5-10 years  INTERVAL HISTORY: Ms. ABernarda Caffeyreturns for follow-up as scheduled, last seen by Dr. FBurr Medico7/25/2022.  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 She does have some back pain but main complaint is chronic left knee pain. She is trying to lose weight and would like liposuction in her legs. She can lose weigh tin her upper body but not her lower. This is concerning her. She is active and independent at home but has reported a recent fall due to left knee pain. Takes calcium and vit D. Denies concerns in her breasts such as new lump/mass, nipple discharge, inversion or skin change. 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 She takes prozac for depression, but she may have stopped  it as she recall someone telling her letrozole treats depression. She has an ill fitting partial denture, scheduled to see her dentist soon.   All other systems were reviewed with the patient and are negative.  MEDICAL HISTORY:  Past Medical History:  Diagnosis Date   Anemia    Anxiety and depression 10/15/2014   Arthritis    BILATERAL KNEES, HAD CORTISONE INJECTION 03/2017   Brain cancer ([place])    Breast cancer ([place]) 2019   Right Breast Cancer   Depression    GERD (gastroesophageal reflux disease)    OCCASIONAL WITH CERTAIN FOOD   Headache    Heart murmur    History of radiation therapy 05/28/18- 07/10/18   Right Breast 25 fractions for a total dose of 50 Gy, Right SCV and PAB 25  fractions for a total dose of 50 Gy, right breast boost, 5 fractions for a total dose of 10 Gy.    Personal history of radiation therapy    Right Breast Cancer   Seasonal allergies    SVD (spontaneous vaginal delivery)    x 4   Unspecified hereditary and idiopathic peripheral neuropathy 05/14/2014   LEFT KNEE AND FOOT    SURGICAL HISTORY: Past Surgical History:  Procedure Laterality Date   ABDOMINAL HYSTERECTOMY     AXILLARY LYMPH NODE DISSECTION Right 04/17/2018   Procedure: AXILLARY LYMPH NODE DISSECTION;  Surgeon: Stark Klein, MD;  Location: [facility];  Service: General;  Laterality: Right;   BREAST EXCISIONAL BIOPSY Right 2007   BREAST LUMPECTOMY Right 2019   BREAST LUMPECTOMY WITH RADIOACTIVE SEED AND SENTINEL LYMPH NODE BIOPSY Right 04/05/2018   Procedure: RIGHT BREAST SEED BRACKETED LUMPECTOMY WITH SENTINEL LYMPH NODE BIOPSY;  Surgeon: Stark Klein, MD;  Location: [facility];  Service: General;  Laterality: Right;   BREAST SURGERY     bx, no cancer    CATARACT EXTRACTION Bilateral    DILATATION & CURETTAGE/HYSTEROSCOPY WITH MYOSURE N/A 08/09/2017   Procedure: [place];  Surgeon: Emily Filbert, MD;  Location: [facility];  Service: Gynecology;  Laterality: N/A;   LABIOPLASTY  03/06/2018   Procedure: LABIAPLASTY;  Surgeon: Emily Filbert, MD;  Location: [facility];  Service: Gynecology;;   TOTAL KNEE ARTHROPLASTY Right 09/14/2020   Procedure: RIGHT TOTAL KNEE ARTHROPLASTY;  Surgeon: Leandrew Koyanagi, MD;  Location: [facility];  Service: Orthopedics;  Laterality: Right;   VAGINAL HYSTERECTOMY Bilateral 03/06/2018   Procedure: HYSTERECTOMY VAGINAL WITH BILATERAL SALPINGOOPHORECTOMY;  Surgeon: Emily Filbert, MD;  Location: [facility];  Service: Gynecology;  Laterality: Bilateral;    I have reviewed the social history and family history with the patient and they are unchanged from previous note.  ALLERGIES:  has No Known  Allergies.  MEDICATIONS:  Current Outpatient Medications  Medication Sig Dispense Refill   tamoxifen (NOLVADEX) 20 MG tablet Take 1 tablet (20 mg total) by mouth daily. 30 tablet 3   amitriptyline (ELAVIL) 25 MG tablet TAKE 2 TABLETS(50 MG) BY MOUTH AT BEDTIME. MAY INCREASE TO 75 MG AT BEDTIME AFTER 2 WEEKS 60 tablet 3   aspirin EC 81 MG tablet Take 1 tablet (81 mg total) by mouth 2 (two) times daily. To be taken after surgery to prevent blood clots 84 tablet 0   b complex vitamins tablet Take 1 tablet by mouth daily.     cephALEXin (KEFLEX) 500 MG capsule Take 1 capsule (500 mg total) by mouth 4 (four) times daily. 40 capsule 0  cholecalciferol (VITAMIN D3) 25 MCG (1000 UNIT) tablet Take 1,000 Units by mouth daily.     ferrous sulfate 325 (65 FE) MG EC tablet Take 325 mg by mouth 3 (three) times daily with meals. (Patient not taking: Reported on 09/09/2020)     FLUoxetine (PROZAC) 40 MG capsule Take 40 mg by mouth every morning.     gabapentin (NEURONTIN) 300 MG capsule Take 1 capsule (300 mg total) by mouth 3 (three) times daily. 90 capsule 3   hydrOXYzine (ATARAX/VISTARIL) 10 MG tablet Take 10 mg by mouth 3 (three) times daily as needed for anxiety.      methocarbamol (ROBAXIN) 500 MG tablet Take 1 tablet (500 mg total) by mouth 2 (two) times daily as needed. To be taken after surgery as needed 20 tablet 0   omeprazole (PRILOSEC) 40 MG capsule TAKE 1 CAPSULE(40 MG) BY MOUTH TWICE DAILY FOR 10 DAYS (Patient not taking: Reported on 09/09/2020) 20 capsule 0   ondansetron (ZOFRAN) 4 MG tablet Take 1 tablet (4 mg total) by mouth every 8 (eight) hours as needed for nausea or vomiting. 40 tablet 0   oxyCODONE-acetaminophen (PERCOCET) 5-325 MG tablet Take 1-2 tablets by mouth every 6 (six) hours as needed. 40 tablet 0   promethazine (PHENERGAN) 25 MG tablet Take 1 tablet (25 mg total) by mouth every 6 (six) hours as needed for nausea. 30 tablet 1   traZODone (DESYREL) 100 MG tablet Take 100 mg by  mouth at bedtime.     vitamin B-12 (CYANOCOBALAMIN) 500 MCG tablet Take 500 mcg by mouth daily.     No current facility-administered medications for this visit.    PHYSICAL EXAMINATION: ECOG PERFORMANCE STATUS: 1 - Symptomatic but completely ambulatory  Vitals:   12/07/21 1006  BP: (!) 133/56  Pulse: 67  Resp: 18  Temp: 97.8 F (36.6 C)  SpO2: 97%   Filed Weights   12/07/21 1006  Weight: 216 lb 11.2 oz (98.3 kg)    GENERAL:alert, no distress and comfortable SKIN: no rash  EYES:  sclera clear NECK: without mass LYMPH:  no palpable cervical or supraclavicular lymphadenopathy LUNGS: clear with normal breathing effort HEART: regular rate & rhythm, large lower extremities not edematous ABDOMEN: abdomen soft, non-tender and normal bowel sounds Musculoskeletal: nonfocal  NEURO: alert & oriented x 3 with fluent speech, motor deficits. Exam performed in chair Breast exam: no bilateral nipple discharge or inversion. S/p right lumpectomy and RT with distorted tissue/scar tissue. No palpable mass in either breast or axilla that I could appreciate   LABORATORY DATA:  I have reviewed the data as listed CBC Latest Ref Rng & Units 12/07/2021 05/17/2021 11/16/2020  WBC 4.0 - 10.5 K/uL 4.8 5.5 2.9(L)  Hemoglobin 12.0 - 15.0 g/dL 12.4 11.9(L) 10.8(L)  Hematocrit 36.0 - 46.0 % 37.7 36.6 34.9(L)  Platelets 150 - 400 K/uL 254 266 203     CMP Latest Ref Rng & Units 12/07/2021 05/17/2021 11/16/2020  Glucose 70 - 99 mg/dL 89 124(H) 83  BUN 8 - 23 mg/dL 27(H) 31(H) 20  Creatinine 0.44 - 1.00 mg/dL 1.02(H) 1.12(H) 1.01(H)  Sodium 135 - 145 mmol/L 137 140 141  Potassium 3.5 - 5.1 mmol/L 5.3(H) 4.9 4.1  Chloride 98 - 111 mmol/L 103 109 107  CO2 22 - 32 mmol/L 21(L) 22 24  Calcium 8.9 - 10.3 mg/dL 9.6 9.7 9.2  Total Protein 6.5 - 8.1 g/dL 6.4(L) 7.0 6.5  Total Bilirubin 0.3 - 1.2 mg/dL 0.2(L) 0.3 0.2(L)  Alkaline Phos 38 -  126 U/L 123 148(H) 110  AST 15 - 41 U/L _0 ALT 0 - 44 U/L _1 RADIOGRAPHIC STUDIES: I have personally reviewed the radiological images as listed and agreed with the findings in the report. No results found.   ASSESSMENT & PLAN: Amy Roman is a 71 y.o. African female with a history of Arthritis and neuropathy   1.  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 oncotype showed low risk, adjuvant chemo was not recommended.  -began adjuvant AI with letrozole in 07/2018, tolerating well with stable joint pain. Goal is 5-10 years -Amy Roman is clinically doing well.  Tolerating letrozole with stable joint pain.  Breast exam is benign, labs are stable.  Mammogram 07/02/2021 was negative.  Overall there is no clinical concern for breast cancer recurrence. -CA 27.29 slightly elevated to 38.8, will repeat when she returns for Zometa -Due to her progression from osteopenia to osteoporosis I recommend to stop letrozole and switch to tamoxifen.  She agrees to wean off Prozac due to DDI. -She is nearing 4 years from initial diagnosis, the recurrence risk has decreased. Continue surveillance   2. Bone Health -baseline DEXA 10/29/19 showed osteopenia T score -2 at AP spine. Low frax score at hip with 1% fracture risk in 10 years -On 11/16/2021 DEXA osteopenia worsened to osteoporosis at the AP spine, T score -2.5.  There was mild worsening bone density at the femur as well.  FRAX score not included -We discussed AI's can worsen bone density, and given her knee pain and recent fall she is at high risk for fracture -I recommend to stop letrozole and begin tamoxifen, potential side effects were reviewed.  S/p hysterectomy the endometrial cancer risk does not apply -She would need to wean off Prozac before starting tamoxifen, she is agreeable, I will ask PCP to assist -I reviewed the  potential risk/benefit, and side effects of IV bisphosphonate such as Zometa every 6 months x2 years, she is interested and agreeable to start after dental clearance (Dr. Jamse Arn) -she has a poorly fitting denture and scheduled to see dentist soon -Serum creatinine has improved, she is a candidate  3. anxiety/depression -Stable on fluoxetine, Elavil, and trazodone -Due to DDI between fluoxetine and tamoxifen I recommend to wean off antidepressant and switch to something else per guidance from PCP.  Patient agrees, I will ask Dr. Hildred Alamin to assist  4. Iron and B12 deficiency anemia -she developed mild anemia Hg 10.3 in 02/2018, worsened to 9.2 in 06/2018 with work up showing iron 22, TIBC 505, 4% sat, and ferritin 9, normal B12, MMA, and folate. consistent with IDA -She did have B12 196 which is low range/normal about 6 years ago. On oral B12 500 mcg daily  -colonoscopy 09/2018 (Nandigam) showed diverticulosis and non bleeding internal hemorrhoids. She source of her IDA has not been confirmed.  -she did not tolerate oral iron and declined RBC transfusion -she received IV Feraheme in the past, responded well.  -Resolved  5. Osteoarthritis, neuropathy  -She had significant arthritis in her b/l knees; s/p right knee replacement in 08/2020 -followed by Dr. Mickeal Skinner for neuropathy, stable on oral B12 and gabapentin -she can't exercise much due to knee pain, she has tried to lose weight on her own without much success.  -I referred her to  medical weight loss management   6. Lung nodules -Found on 04/16/18 CT scan, stable on 02/2020 repeat CT chest    Plan: -Labs, mammogram reviewed -Stop letrozole due to osteoporosis  -Wean off prozac per PCP oversight, to start tamoxifen. Will ask Dr. Hildred Alamin [facility] medical) to assist -Dental clearance for Zometa (Dr. Jamse Arn) -Phone f/up in 1 month to make sure she has weaned off prozac and stable, and will start tamoxifen -Zometa q6 months x2 years, starting  in 4-6 weeks after dental clearance is received -next routine surveillance visit in 7 months with second Zometa  -Referral for medical weight loss management   All questions were answered. The patient knows to call the clinic with any problems, questions or concerns. No barriers to learning was detected with interpreter present. I spent 30 minutes counseling the patient face to face. The total time spent in the appointment was 45 minutes and more than 50% was on counseling and review of test results     Alla Feeling, NP 12/08/21

## 2021-12-08 ENCOUNTER — Encounter: Payer: Self-pay | Admitting: Nurse Practitioner

## 2021-12-08 ENCOUNTER — Telehealth: Payer: Self-pay | Admitting: Hematology

## 2021-12-08 LAB — CANCER ANTIGEN 27.29: CA 27.29: 38.8 U/mL — ABNORMAL HIGH (ref 0.0–38.6)

## 2021-12-08 LAB — IRON AND IRON BINDING CAPACITY (CC-WL,HP ONLY)
Iron: 72 ug/dL (ref 28–170)
Saturation Ratios: 15 % (ref 10.4–31.8)
TIBC: 482 ug/dL — ABNORMAL HIGH (ref 250–450)
UIBC: 410 ug/dL (ref 148–442)

## 2021-12-08 NOTE — Telephone Encounter (Signed)
Left message with follow-up appointments per 2/14 los. °

## 2021-12-09 ENCOUNTER — Telehealth: Payer: Self-pay

## 2021-12-09 NOTE — Telephone Encounter (Signed)
LVM on pt's son's cellphone stating the pt's lab work from 12/08/2021 has resulted.  The pt's potassium is slightly elevated and Cira Rue, NP would like for the pt to stop taking supplemental potassium.  Lacie also recommends that the pt increased her hydration and to monitor her bowel movements.  Lacie would like for the pt to maintain regular bowel movements.  The pt's CA 27.29 is also slightly elevated which has fluctuated up and down in the past eventually normalized. Dr. Burr Medico and Cira Rue, NP will continue to the pt's labs and the pts is scheduled to return next month for Zometa and labs.  Instructed pt's son, to please contact Dr. Ernestina Penna office should he or his mom have further questions or concerns.

## 2021-12-16 ENCOUNTER — Telehealth: Payer: Self-pay

## 2021-12-16 NOTE — Telephone Encounter (Signed)
This nurse reached out to this patient's son related to message he left to know which medication the patient is supposed to stop taking related to abnormal lab levels.  This nurse advised that patient had elevated potassium levels and she is encouraged to stop taking her Potassium pills.  The son request to know if it would be ok for the patient to continue taking Vit E, Vit, D and Vit B-12.  The nurse advised that the provider has not recommended to discontinue taking any of those supplements at this time.  Patient's son request to know if a list of foods that patient should not eat can be mailed to  his home.  This nurse advised that a list will be mailed however we do not encourage to completely remove these food items from the patients diet because she does need Potassium there just needs to be a balance, so we will continue to monitor her levels.  Encouraged to make sure patient stays hydrated and has regular bowel movements.  Son acknowledged understanding.  No further questions at this time.

## 2021-12-28 ENCOUNTER — Encounter: Payer: Self-pay | Admitting: Nurse Practitioner

## 2022-01-04 ENCOUNTER — Encounter: Payer: Self-pay | Admitting: Nurse Practitioner

## 2022-01-04 ENCOUNTER — Telehealth: Payer: Self-pay

## 2022-01-04 ENCOUNTER — Inpatient Hospital Stay: Payer: 59 | Attending: Nurse Practitioner | Admitting: Nurse Practitioner

## 2022-01-04 DIAGNOSIS — M816 Localized osteoporosis [Lequesne]: Secondary | ICD-10-CM | POA: Diagnosis not present

## 2022-01-04 DIAGNOSIS — C50511 Malignant neoplasm of lower-outer quadrant of right female breast: Secondary | ICD-10-CM | POA: Diagnosis not present

## 2022-01-04 DIAGNOSIS — Z170 Estrogen receptor positive status [ER+]: Secondary | ICD-10-CM | POA: Diagnosis not present

## 2022-01-04 NOTE — Telephone Encounter (Signed)
This nurse attempted to reach Dr. Jamse Arn to obtain a dental clearance for this patient to begin Zometa.  Left a message for a return call to the office.  No further questions or concerns at this time.   ?

## 2022-01-04 NOTE — Progress Notes (Signed)
?[place]   ?Telephone:([phone]) [phone] [phone]   ?Clinic Follow up Note  ? ?Patient Care Team: ?Amy Landsman, MD as PCP - General (Family Medicine) ?Amy Merle, MD as Consulting Physician (Hematology) ?Amy Klein, MD as Consulting Physician (General Surgery) ?Amy Gibson, MD as Attending Physician (Radiation Oncology) ?Amy Feeling, NP as Nurse Practitioner (Nurse Practitioner) ?01/04/2022 ? ?I connected with  Amy Roman on 01/04/22 by a video enabled telemedicine application and verified that I am speaking with the correct person using two identifiers. ?  ?I discussed the limitations of evaluation and management by telemedicine. The patient expressed understanding and agreed to proceed.  ? ?Others in attendance: pt's son Amy Roman ? ?Provider's location: [facility] office  ? ?CHIEF COMPLAINT: F/up med check  ? ?SUMMARY OF ONCOLOGIC HISTORY: ?Oncology History Overview Note  ?Cancer Staging ?Malignant neoplasm of 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, estrogen receptor positive ([place]) ?Staging form: Breast, AJCC 8th Edition ?- Clinical stage from 02/16/2018: Stage IB (cT2(m), cN0, cM0, G2, ER+, PR+, HER2-) - Signed by Amy Merle, MD on 03/11/2018 ? ?  ?Malignant neoplasm of 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, estrogen receptor positive ([place])  ?01/30/2018 Mammogram  ? Screening mammogram FINDINGS: ?In the right breast, possible distortion warrants further ?evaluation. The patient has had an excisional biopsy of her right breast upper outer quadrant in 2007, however this distortion is either better seen or new from her prior mammogram, and it does not quite match the surgical site as marked by a skin scar marker. In the left breast, no findings suspicious for malignancy. ?  ?02/14/2018 Mammogram  ? Diagnostic mammogram: ?In the lateral aspect of the right breast, posterior depth there is a prominent area of distortion at the approximate 9 o'clock location. While this is  in the region of the excisional biopsy that the patient had in 2007, the distortion has become significantly more prominent as compared to the tomosynthesis mammogram performed ?in September of 2016, and appears to be inferior to the surgical scar. ?  ?02/14/2018 Breast US  ? Ultrasound of the right breast at 8:30, 4 cm from the nipple demonstrates an irregular hypoechoic vascular mass with spiculated margins measuring 2.1 x 2.0 x 1.8 cm. There is a small adjacent mass at 9 o'clock, 4 cm from the nipple, which is 1.2 cm away from the dominant mass. The smaller mass measures 1.1 x 0.7 x 0.7 cm. Together, the 2 masses span 3.6 cm of tissue. Ultrasound of the right axilla demonstrates multiple normal-appearing lymph nodes. ?  ?IMPRESSION: ?1. There is a highly suspicious mass in the right breast at 830 with a small possible satellite lesion at 9 o'clock. ?2.  No evidence of right axillary lymphadenopathy. ?  ?02/16/2018 Cancer Staging  ? Staging form: Breast, AJCC 8th Edition ?- Clinical stage from 02/16/2018: Stage IB (cT2(m), cN0, cM0, G2, ER+, PR+, HER2-) - Signed by Amy Merle, MD on 03/11/2018 ? ?  ?02/16/2018 Initial Biopsy  ? Diagnosis ?1. Breast, right, needle core biopsy, 8:30 o'clock ?- INVASIVE DUCTAL CARCINOMA, SEE COMMENT. ?2. Breast, right, needle core biopsy, 9 o'clock ?- INVASIVE DUCTAL CARCINOMA. ?- LOBULAR NEOPLASIA (ATYPICAL LOBULAR HYPERPLASIA). ? ?ER: 95%, positive, strong staining ?PR: 80%, positive, moderate staining  ?Proliferation Marker Ki67: 2% ? ?HER2 - Negative ?Ratio of HER2/CEP17 signals: 1.80 ?Average HER2 copy number per cell: 2.97 ? ?  ?03/01/2018 Imaging  ? MR Bilateral breast ?IMPRESSION: ?Two areas of known malignancy in the LATERAL portion of  the RIGHT breast. No additional areas of concern in either breast. No axillary adenopathy. ?  ?03/11/2018 Initial Diagnosis  ? Malignant neoplasm of 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, estrogen receptor positive ([place]) ?  ?04/05/2018 Surgery   ? RIGHT BREAST SEED BRACKETED LUMPECTOMY WITH SENTINEL LYMPH NODE BIOPSY by Dr. Barry Roman ?04/05/18 ?  ?04/05/2018 Pathology Results  ? Surgery  ?Diagnosis 04/05/18  ?1. Breast, lumpectomy, right ?- INVASIVE DUCTAL CARCINOMA GRADE I/III, TWO FOCI SPANNING 2.9 CM AND 0.9 CM. ?- LOBULAR NEOPLASIA (LOBULAR CARCINOMA IN SITU). ?- DUCTAL CARCINOMA IN SITU, LOW GRADE. ?- LYMPHOVASCULAR INVASION IS IDENTIFIED. ?- THE SURGICAL RESECTION MARGINS ARE NEGATIVE FOR DUCTAL CARCINOMA. ?- SEE ONCOLOGY TABLE BELOW. ?2. Lymph node, sentinel, biopsy, right ?- THERE IS NO EVIDENCE OF CARCINOMA IN 1 OF 1 LYMPH NODE (0/1). ?3. Lymph node, sentinel, biopsy, right ?- METASTATIC CARCINOMA IN 1 OF 1 LYMPH NODE (1/1) WITH EXTRACAPSULAR EXTENSION. ?- EXTRACAPSULAR EXTENSION EXTENDS 0.3 CM. ?4. Lymph node, sentinel, biopsy, right ?- DUCTAL CARCINOMA. ?- PERINEURAL INVASION IS IDENTIFIED. ?- SEE COMMENT. ?5. Lymph node, sentinel, biopsy, right ?1 of 4 ?  ?04/05/2018 Miscellaneous  ? Mammaprint ?Low Risk with 10% risk of recurrance with Tamoxifen alone  ?MPI at +0.240 ?DMFI at 97.8% ?  ?04/12/2018 Cancer Staging  ? Staging form: Breast, AJCC 8th Edition ?- Pathologic: Stage IA (pT2, pN1a, cM0, G1, ER+, PR+, HER2-) - Signed by Amy Merle, MD on 04/12/2018 ? ?  ?04/13/2018 Imaging  ? Whole Body Bone scan 04/13/18 ?IMPRESSION: ?No scintigraphic evidence of osseous metastatic disease. ?  ?04/16/2018 Imaging  ? CT CAP W Contrast 04/16/18  ?IMPRESSION: ?1. No definite findings of metastatic disease in the chest. Solitary ?solid 4 mm right lower lobe pulmonary nodule, for which initial ?chest CT follow-up is advised in 3 months. ?2. Postsurgical changes from right breast lumpectomy and right ?axillary node dissection. Thin-walled fluid collections in the right ?breast and right axilla are most compatible with postsurgical ?seromas. ?3. No evidence of metastatic disease in the abdomen or pelvis. ? ?  ?04/17/2018 Surgery  ? AXILLARY LYMPH NODE DISSECTION by Dr. Barry Roman  04/17/18  ? ?  ?04/17/2018 Pathology Results  ? Diagnosis 04/17/18  ?Lymph nodes, regional resection, Right Axillary ?- BENIGN FIBROADIPOSE AND BREAST TISSUE WITH FAT NECROSIS. ?- NO LYMPHOID TISSUE PRESENT. ?- NO MALIGNANCY IDENTIFIED. ?Microscopic Comment ?The entire specimen was submitted. ?  ?05/28/2018 - 07/10/2018 Radiation Therapy  ? Radiation treatment dates:   05/28/2018-07/10/2018 ?  ?Site/dose:    ?1. Right breast, 2 Gy in 25 fractions for a total dose of 50 Gy ?2. Right SCV, 2 Gy in 25 fractions for a total dose of 50 Gy ?3. Right breast boost, 2 Gy in 5 fractions for a total dose of 5 Gy         ?  ?07/24/2018 -  Anti-estrogen oral therapy  ? Letrozole 2.$RemoveBefor'5mg'VzVLLOCSzFIc$  daily starting 07/24/18 ?  ? Survivorship  ? Per Amy Rue, NP  ?  ? ? ?CURRENT THERAPY:  ?Letrozole starting 07/2018, goal is 5-10 years ?Changed to tamoxifen 12/2021 due to osteoporosis  ? ?INTERVAL HISTORY: Amy Roman and her son Amy Roman present for phone f/up as scheduled. She stopped letrozole and started tamoxifen. She is doing fine with it except insomnia. This resolved once she refilled gabapentin and amitriptyline. She had worsening neuropathy in her fingertips when she ran out. She would like to see Dr. Mickeal Roman again. She is still taking prozac and has not seen the dentist yet. She  otherwise has no complaints.   ? ? ?MEDICAL HISTORY:  ?Past Medical History:  ?Diagnosis Date  ? Anemia   ? Anxiety and depression 10/15/2014  ? Arthritis   ? BILATERAL KNEES, HAD CORTISONE INJECTION 03/2017  ? Brain cancer [facility])   ? Breast cancer ([place]) 2019  ? Right Breast Cancer  ? Depression   ? GERD (gastroesophageal reflux disease)   ? OCCASIONAL WITH CERTAIN FOOD  ? Headache   ? Heart murmur   ? History of radiation therapy 05/28/18- 07/10/18  ? Right Breast 25 fractions for a total dose of 50 Gy, Right SCV and PAB 25 fractions for a total dose of 50 Gy, right breast boost, 5 fractions for a total dose of 10 Gy.   ? Personal history of radiation therapy   ? Right Breast  Cancer  ? Seasonal allergies   ? SVD (spontaneous vaginal delivery)   ? x 4  ? Unspecified hereditary and idiopathic peripheral neuropathy 05/14/2014  ? LEFT KNEE AND FOOT  ? ? ?SURGICAL HISTORY: ?[place]

## 2022-01-05 ENCOUNTER — Telehealth: Payer: Self-pay | Admitting: Hematology

## 2022-01-05 ENCOUNTER — Inpatient Hospital Stay: Payer: 59 | Admitting: Hematology

## 2022-01-05 NOTE — Telephone Encounter (Signed)
Left message with rescheduled upcoming appointment per 3/14 los. ?

## 2022-01-18 ENCOUNTER — Inpatient Hospital Stay: Payer: 59

## 2022-02-15 ENCOUNTER — Other Ambulatory Visit: Payer: 59

## 2022-02-15 ENCOUNTER — Inpatient Hospital Stay: Payer: 59

## 2022-02-15 ENCOUNTER — Inpatient Hospital Stay: Payer: 59 | Attending: Nurse Practitioner

## 2022-02-15 ENCOUNTER — Other Ambulatory Visit: Payer: Self-pay

## 2022-02-15 ENCOUNTER — Ambulatory Visit: Payer: 59

## 2022-02-15 DIAGNOSIS — Z923 Personal history of irradiation: Secondary | ICD-10-CM | POA: Insufficient documentation

## 2022-02-15 DIAGNOSIS — C50511 Malignant neoplasm of lower-outer quadrant of right female breast: Secondary | ICD-10-CM | POA: Diagnosis present

## 2022-02-15 DIAGNOSIS — Z79810 Long term (current) use of selective estrogen receptor modulators (SERMs): Secondary | ICD-10-CM | POA: Insufficient documentation

## 2022-02-15 DIAGNOSIS — Z79899 Other long term (current) drug therapy: Secondary | ICD-10-CM | POA: Insufficient documentation

## 2022-02-15 DIAGNOSIS — Z7982 Long term (current) use of aspirin: Secondary | ICD-10-CM | POA: Diagnosis not present

## 2022-02-15 DIAGNOSIS — Z170 Estrogen receptor positive status [ER+]: Secondary | ICD-10-CM | POA: Insufficient documentation

## 2022-02-15 NOTE — Progress Notes (Signed)
This nurse spoke with patients son and explained that patients infusion was cancelled for today due to patient needing a dental clearance to receive Zometa.  Explained that we have been attempting to reach Dr. Jamse Arn for over a month to obtain the clearance with no return call.  Son explained to this nurse that patients dental appointment was cancelled due to patient fasting for 1 month. Son had questions about Zometa, this nurse educated on what the Zometa was treating and why the clearance was needed.  Son acknowledged understanding.  States that he will get the appointment rescheduled today and have them to reach out to our office with the dental clearance.  No further questions or concerns at this time.   ?

## 2022-02-16 LAB — CANCER ANTIGEN 27.29: CA 27.29: 32.3 U/mL (ref 0.0–38.6)

## 2022-03-11 ENCOUNTER — Telehealth: Payer: Self-pay

## 2022-03-11 NOTE — Telephone Encounter (Signed)
Spoke with pt's son [name] regarding pt's Prozac.  [name] stated that his mom is no longer taking the Prozac now since she is taking Tamoxifen.  [name] also stated that he spoke with his mom's dental office and they told him that he would need to contact an oral surgeon for dental clearance for his mom to receive Zometa.  Asked [name] is his mom was having any major dental work done on her teeth since she's having to see an Chief Financial Officer.  [name] stated "NO"; therefore, asked [name] why is the pt being referred to an oral surgeon.  [name] stated for dental clearance so she can receive her Zometa infusion.  Educated [name] on why the pt is receiving Zometa and why dental clearance is needed.  Informed pt that dental clearance is needed by the pt's dentist if the pt is going to have root canals, crowns, fillings placed, issues with her jaw (lock jaw), and etc.  Informed [name] that Zometa if administered during this time could cause more harm than good; therefore, it's usually not given until it is safe to administer to the pt.  [name] verbalized understanding and stated he would contact the pt's dentist and have them to contact Dr. Burr Medico & [facility] office to speak with one of their nurses.  [name] confirmed the telephone to the cancer center [phone]).  [name] had no further questions or concerns at this time.

## 2022-03-22 ENCOUNTER — Telehealth: Payer: Self-pay

## 2022-03-22 NOTE — Telephone Encounter (Signed)
This nurse spoke with patients son [name] and made him aware that dental clearance has been received from patients dentist.    Advised that patient is scheduled for her first Zometa on 9/27.  Son acknowledged understanding.  No further questions or concerns at this time.

## 2022-03-23 ENCOUNTER — Other Ambulatory Visit: Payer: Self-pay

## 2022-03-29 ENCOUNTER — Telehealth: Payer: Self-pay | Admitting: Orthopaedic Surgery

## 2022-03-29 NOTE — Telephone Encounter (Signed)
I recommend a referral to the weight loss clinic.

## 2022-03-29 NOTE — Telephone Encounter (Addendum)
Patient's son Amy Roman calling regarding his mom having left total knee surgery.  He states patient has been to the cancer center at [facility] and the results of the bone density test showed rapid deterioration.  She would like to have the total knee ASAP.  I did explain to Amy Roman the surgery would require a risk and benefit assessment by Dr. Erlinda Hong because patient's BMI is 40+ (patient is 5'1 and weighed 216 lbs in Feb of this year).  Her current weight according to the son is 219 lbs (she has gained).  I asked if patient had been to see a nutritionist and he stated she was given material to read which related to making healthier choices.  Amy Roman is requesting a call back to discuss surgery and what he can do to help his mother.   [place]

## 2022-03-30 ENCOUNTER — Other Ambulatory Visit: Payer: Self-pay

## 2022-03-30 DIAGNOSIS — M1712 Unilateral primary osteoarthritis, left knee: Secondary | ICD-10-CM

## 2022-03-30 NOTE — Telephone Encounter (Signed)
Placed referral to weight loss clinic.

## 2022-04-07 ENCOUNTER — Other Ambulatory Visit: Payer: Self-pay

## 2022-04-07 ENCOUNTER — Encounter (HOSPITAL_COMMUNITY): Payer: Self-pay

## 2022-04-07 ENCOUNTER — Emergency Department (HOSPITAL_COMMUNITY)
Admission: EM | Admit: 2022-04-07 | Discharge: 2022-04-07 | Disposition: A | Discharge status: Home / Self Care | Payer: 59 | Attending: Emergency Medicine | Admitting: Emergency Medicine

## 2022-04-07 DIAGNOSIS — R102 Pelvic and perineal pain: Secondary | ICD-10-CM | POA: Diagnosis present

## 2022-04-07 DIAGNOSIS — Z7982 Long term (current) use of aspirin: Secondary | ICD-10-CM | POA: Diagnosis not present

## 2022-04-07 DIAGNOSIS — Z853 Personal history of malignant neoplasm of breast: Secondary | ICD-10-CM | POA: Diagnosis not present

## 2022-04-07 DIAGNOSIS — N390 Urinary tract infection, site not specified: Secondary | ICD-10-CM | POA: Diagnosis not present

## 2022-04-07 LAB — CBC WITH DIFFERENTIAL/PLATELET
Abs Immature Granulocytes: 0.03 10*3/uL (ref 0.00–0.07)
Basophils Absolute: 0.1 10*3/uL (ref 0.0–0.1)
Basophils Relative: 1 %
Eosinophils Absolute: 0.1 10*3/uL (ref 0.0–0.5)
Eosinophils Relative: 1 %
HCT: 41.8 % (ref 36.0–46.0)
Hemoglobin: 13.4 g/dL (ref 12.0–15.0)
Immature Granulocytes: 0 %
Lymphocytes Relative: 19 %
Lymphs Abs: 1.6 10*3/uL (ref 0.7–4.0)
MCH: 30 pg (ref 26.0–34.0)
MCHC: 32.1 g/dL (ref 30.0–36.0)
MCV: 93.7 fL (ref 80.0–100.0)
Monocytes Absolute: 1 10*3/uL (ref 0.1–1.0)
Monocytes Relative: 12 %
Neutro Abs: 5.6 10*3/uL (ref 1.7–7.7)
Neutrophils Relative %: 67 %
Platelets: 243 10*3/uL (ref 150–400)
RBC: 4.46 MIL/uL (ref 3.87–5.11)
RDW: 14.6 % (ref 11.5–15.5)
WBC: 8.3 10*3/uL (ref 4.0–10.5)
nRBC: 0 % (ref 0.0–0.2)

## 2022-04-07 LAB — URINALYSIS, ROUTINE W REFLEX MICROSCOPIC
Bilirubin Urine: NEGATIVE
Glucose, UA: NEGATIVE mg/dL
Ketones, ur: NEGATIVE mg/dL
Nitrite: POSITIVE — AB
Protein, ur: 100 mg/dL — AB
RBC / HPF: >50 RBC/hpf — ABNORMAL HIGH (ref 0–5)
Specific Gravity, Urine: 1.014 (ref 1.005–1.030)
WBC, UA: >50 WBC/hpf — ABNORMAL HIGH (ref 0–5)
pH: 6 (ref 5.0–8.0)

## 2022-04-07 LAB — COMPREHENSIVE METABOLIC PANEL
ALT: 18 U/L (ref 0–44)
AST: 31 U/L (ref 15–41)
Albumin: 4 g/dL (ref 3.5–5.0)
Alkaline Phosphatase: 96 U/L (ref 38–126)
Anion gap: 5 (ref 5–15)
BUN: 22 mg/dL (ref 8–23)
CO2: 25 mmol/L (ref 22–32)
Calcium: 9.5 mg/dL (ref 8.9–10.3)
Chloride: 111 mmol/L (ref 98–111)
Creatinine, Ser: 0.99 mg/dL (ref 0.44–1.00)
GFR, Estimated: >60 mL/min (ref >60)
Glucose, Bld: 103 mg/dL — ABNORMAL HIGH (ref 70–99)
Potassium: 4.8 mmol/L (ref 3.5–5.1)
Sodium: 141 mmol/L (ref 135–145)
Total Bilirubin: 0.6 mg/dL (ref 0.3–1.2)
Total Protein: 7.8 g/dL (ref 6.5–8.1)

## 2022-04-07 MED ORDER — CEPHALEXIN 500 MG PO CAPS: 500.0000 mg | ORAL_CAPSULE | Freq: Four times a day (QID) | ORAL | 0 refills | Status: DC

## 2022-04-07 MED ORDER — CEPHALEXIN 500 MG PO CAPS
500.0000 mg | ORAL_CAPSULE | Freq: Once | ORAL | Status: AC
  Administered 2022-04-07: 500 mg via ORAL
  Filled 2022-04-07: qty 1

## 2022-04-07 NOTE — ED Triage Notes (Signed)
Patient patient c/o vaginal pain for 3-4 months, but severe pain that started last night. Patient c/o urinary incontinence for years, but worse recently. Patient also c/o burning and pain in the past week. Patient also c/o intermittent low back pain x years.

## 2022-04-07 NOTE — Discharge Instructions (Signed)
It appears that you have a urinary tract infection today but all the other blood work looks normal.  You were given your first dose of antibiotic here and you can take another dose before you go to bed tonight.  Then you will take it 4 times a day.  If you start having fever, vomiting and worsening pain return to the emergency room.  I would expect within the next 24 to 48 hours you should start feeling better and will continue to get better as the antibiotic works.

## 2022-04-07 NOTE — ED Provider Notes (Signed)
[facility] DEPT Provider Note   CSN: [id number] Arrival date & time: 04/07/22  1337     History  Chief Complaint  Patient presents with   Vaginal Pain   Urinary Incontinence    Amy Roman is a 71 y.o. female.  Patient is a 71 year old female with a history of GERD, breast cancer currently on tamoxifen therapy status post hysterectomy who is presenting today with complaint of vaginal pain, urinary incontinence, frequency urgency and suprapubic pain.  She reports she has had issue with urinary incontinence for years and intermittent vaginal pain for years but in the last 10 days her symptoms have gradually worsened.  She is having some suprapubic pain that radiates into her low back.  She reports she is not just leaking all the time but when she has to go to the bathroom to urinate she cannot hold it till she gets to the toilet.  She has not noticed any blood in her urine and she denies any vaginal bleeding.  No fevers nausea or vomiting.  The history is provided by the patient.  Vaginal Pain       Home Medications Prior to Admission medications   Medication Sig Start Date End Date Taking? Authorizing Provider  cephALEXin (KEFLEX) 500 MG capsule Take 1 capsule (500 mg total) by mouth 4 (four) times daily. 04/07/22  Yes Hazelee Harbold, Loree Fee, MD  amitriptyline (ELAVIL) 25 MG tablet TAKE 2 TABLETS(50 MG) BY MOUTH AT BEDTIME. MAY INCREASE TO 75 MG AT BEDTIME AFTER 2 WEEKS 06/29/21   Vaslow, Acey Lav, MD  aspirin EC 81 MG tablet Take 1 tablet (81 mg total) by mouth 2 (two) times daily. To be taken after surgery to prevent blood clots 09/11/20   Aundra Dubin, PA-C  b complex vitamins tablet Take 1 tablet by mouth daily.    [provider]  cholecalciferol (VITAMIN D3) 25 MCG (1000 UNIT) tablet Take 1,000 Units by mouth daily.    [provider]  ferrous sulfate 325 (65 FE) MG EC tablet Take 325 mg by mouth 3 (three) times daily  with meals. Patient not taking: Reported on 09/09/2020    [provider]  FLUoxetine (PROZAC) 40 MG capsule Take 40 mg by mouth every morning. 05/04/20   [provider]  gabapentin (NEURONTIN) 300 MG capsule Take 1 capsule (300 mg total) by mouth 3 (three) times daily. 10/27/20   Leandrew Koyanagi, MD  hydrOXYzine (ATARAX/VISTARIL) 10 MG tablet Take 10 mg by mouth 3 (three) times daily as needed for anxiety.  04/04/20   [provider]  methocarbamol (ROBAXIN) 500 MG tablet Take 1 tablet (500 mg total) by mouth 2 (two) times daily as needed. To be taken after surgery as needed 09/11/20   Aundra Dubin, PA-C  omeprazole (PRILOSEC) 40 MG capsule TAKE 1 CAPSULE(40 MG) BY MOUTH TWICE DAILY FOR 10 DAYS Patient not taking: Reported on 09/09/2020 12/02/19   Mauri Pole, MD  ondansetron (ZOFRAN) 4 MG tablet Take 1 tablet (4 mg total) by mouth every 8 (eight) hours as needed for nausea or vomiting. 09/11/20   Aundra Dubin, PA-C  oxyCODONE-acetaminophen (PERCOCET) 5-325 MG tablet Take 1-2 tablets by mouth every 6 (six) hours as needed. 09/29/20   Aundra Dubin, PA-C  promethazine (PHENERGAN) 25 MG tablet Take 1 tablet (25 mg total) by mouth every 6 (six) hours as needed for nausea. 10/27/20   Leandrew Koyanagi, MD  tamoxifen (NOLVADEX) 20 MG tablet  Take 1 tablet (20 mg total) by mouth daily. 12/07/21   Alla Feeling, NP  traZODone (DESYREL) 100 MG tablet Take 100 mg by mouth at bedtime.    [provider]  vitamin B-12 (CYANOCOBALAMIN) 500 MCG tablet Take 500 mcg by mouth daily.    [provider]      Allergies    Patient has no known allergies.    Review of Systems   Review of Systems  Genitourinary:  Positive for vaginal pain.    Physical Exam Updated Vital Signs BP (!) 142/85   Pulse 77   Temp 98.4 F (36.9 C) (Oral)   Resp 14   Ht '4\' 9"'$  (1.448 m)   Wt 99.3 kg   SpO2 100%   BMI 47.39 kg/m  Physical Exam Vitals and nursing note reviewed.   Constitutional:      General: She is not in acute distress.    Appearance: She is well-developed.  HENT:     Head: Normocephalic and atraumatic.  Eyes:     Pupils: Pupils are equal, round, and reactive to light.  Cardiovascular:     Rate and Rhythm: Normal rate and regular rhythm.     Heart sounds: Murmur heard.     No friction rub.  Pulmonary:     Effort: Pulmonary effort is normal.     Breath sounds: Normal breath sounds. No wheezing or rales.  Abdominal:     General: Bowel sounds are normal. There is no distension.     Palpations: Abdomen is soft.     Tenderness: There is abdominal tenderness in the suprapubic area. There is no right CVA tenderness, left CVA tenderness, guarding or rebound.     Comments: Minimal suprapubic tenderness  Musculoskeletal:        General: No tenderness. Normal range of motion.     Comments: No edema  Skin:    General: Skin is warm and dry.     Findings: No rash.  Neurological:     Mental Status: She is alert and oriented to person, place, and time. Mental status is at baseline.     Cranial Nerves: No cranial nerve deficit.  Psychiatric:        Mood and Affect: Mood normal.        Behavior: Behavior normal.     ED Results / Procedures / Treatments   Labs (all labs ordered are listed, but only abnormal results are displayed) Labs Reviewed  COMPREHENSIVE METABOLIC PANEL - Abnormal; Notable for the following components:      Result Value   Glucose, Bld 103 (*)    All other components within normal limits  URINALYSIS, ROUTINE W REFLEX MICROSCOPIC - Abnormal; Notable for the following components:   Color, Urine AMBER (*)    APPearance CLOUDY (*)    Hgb urine dipstick LARGE (*)    Protein, ur 100 (*)    Nitrite POSITIVE (*)    Leukocytes,Ua LARGE (*)    RBC / HPF >50 (*)    WBC, UA >50 (*)    Bacteria, UA MANY (*)    All other components within normal limits  URINE CULTURE  CBC WITH DIFFERENTIAL/PLATELET    EKG None  Radiology No  results found.  Procedures Procedures    Medications Ordered in ED Medications - No data to display  ED Course/ Medical Decision Making/ A&P  Medical Decision Making Amount and/or Complexity of Data Reviewed Labs: ordered. Decision-making details documented in ED Course.  Risk Prescription drug management.   Pt with multiple medical problems and comorbidities and presenting today with a complaint that caries a high risk for morbidity and mortality.  Presenting today with vaginal pain and urinary complaints.  No signs of pyelonephritis.  Concern for UTI vs vaginal prolapse vs bladder prolapse.  No signs of systemic illness.  3:50 PM I independently interpreted patient's labs today and CBC and CMP are within normal limits.  UA today is consistent with a UTI with nitrite leukocyte positive with greater than 50 red and white blood cells with many bacteria.  Feel that this is the cause of her symptoms today.  Patient deferred pelvic exam.  She has not had a urinary tract infection for a very long time and denies any allergies to antibiotics.  Urine culture was done based on patient's age.  She was started on keflex.        Final Clinical Impression(s) / ED Diagnoses Final diagnoses:  Lower urinary tract infectious disease    Rx / DC Orders ED Discharge Orders          Ordered    cephALEXin (KEFLEX) 500 MG capsule  4 times daily        04/07/22 1550              Blanchie Dessert, MD 04/07/22 1550

## 2022-04-07 NOTE — ED Notes (Signed)
Called son for discharge. Will call when he arrives

## 2022-04-07 NOTE — ED Provider Triage Note (Signed)
Emergency Medicine Provider Triage Evaluation Note  [place] , a 71 y.o. female  was evaluated in triage.  Pt complains of dysuria, urinary frequency, urinary incontinance, and flank pain intermittent for a year but worse in the past few days associated with vaginal irritation. Denies vaginal discharge or bleeding. Without fever.   Review of Systems  Positive: As above  Negative: As above  Physical Exam  BP (!) 156/87 (BP Location: Left Arm)   Pulse 80   Temp 98.4 F (36.9 C) (Oral)   Resp 14   Ht '4\' 9"'$  (1.448 m)   Wt 99.3 kg   SpO2 93%   BMI 47.39 kg/m  Gen:   Awake, no distress   Resp:  Normal effort  MSK:   Moves extremities without difficulty  Other:    Medical Decision Making  Medically screening exam initiated at 1:57 PM.  Appropriate orders placed.  [place] was informed that the remainder of the evaluation will be completed by another provider, this initial triage assessment does not replace that evaluation, and the importance of remaining in the ED until their evaluation is complete.     Evlyn Courier, PA-C 04/07/22 1359

## 2022-04-09 LAB — URINE CULTURE: Culture: >=100000 — AB

## 2022-04-10 ENCOUNTER — Telehealth: Payer: Self-pay | Admitting: Emergency Medicine

## 2022-04-10 NOTE — Progress Notes (Addendum)
ED Antimicrobial Stewardship Positive Culture Follow Up   Amy Roman Amy Roman is an 71 y.o. female who presented to [facility] on 04/07/2022 with a chief complaint of  Chief Complaint  Patient presents with   Vaginal Pain   Urinary Incontinence    Recent Results (from the past 720 hour(s))  Urine Culture     Status: Abnormal   Collection Time: 04/07/22  3:53 PM   Specimen: Urine, Clean Catch  Result Value Ref Range Status   Specimen Description   Final    URINE, CLEAN CATCH Performed at [facility], [place] [address], [place], [place] [address]    Special Requests   Final    NONE Performed at [facility], [place] [address], [place], [place] [address]    Culture >=100,000 COLONIES/mL ESCHERICHIA COLI (A)  Final   Report Status 04/09/2022 FINAL  Final   Organism ID, Bacteria ESCHERICHIA COLI (A)  Final      Susceptibility   Escherichia coli - MIC*    AMPICILLIN >=32 RESISTANT Resistant     CEFAZOLIN 32 INTERMEDIATE Intermediate     CEFEPIME <=0.12 SENSITIVE Sensitive     CEFTRIAXONE 0.5 SENSITIVE Sensitive     CIPROFLOXACIN <=0.25 SENSITIVE Sensitive     GENTAMICIN <=1 SENSITIVE Sensitive     IMIPENEM <=0.25 SENSITIVE Sensitive     NITROFURANTOIN <=16 SENSITIVE Sensitive     TRIMETH/SULFA <=20 SENSITIVE Sensitive     AMPICILLIN/SULBACTAM >=32 RESISTANT Resistant     PIP/TAZO <=4 SENSITIVE Sensitive     * >=100,000 COLONIES/mL ESCHERICHIA COLI    '[x]'$  Prescribed cephalexin '500mg'$  PO QID x 7 days, organism intermediate to prescribed cefazolin.  Recommend to contact patient for evaluation of symptoms. If symptoms (urinary frequency and suprapubic pain) have improved, can continue cephalexin. If symptoms still persist, recommend switching to cefdinir '300mg'$  PO BID for 7 days.  '[]'$  Patient discharged originally without antimicrobial agent and treatment is now indicated  New antibiotic prescription: If symptoms still persist, recommend switching to  cefdinir '300mg'$  PO BID for 7 days.   ED Provider: Gust Brooms, PA-C  Dimple Nanas, PharmD Clinical Pharmacist Monday - Friday phone -  [phone] Saturday - Sunday phone - [phone]

## 2022-04-10 NOTE — Telephone Encounter (Signed)
Post ED Visit - Positive Culture Follow-up: Successful Patient Follow-Up  Culture assessed and recommendations reviewed by:  '[]'$  Elenor Quinones, Pharm.D. '[]'$  Heide Guile, Pharm.D., BCPS AQ-ID '[]'$  Parks Neptune, Pharm.D., BCPS '[]'$  Alycia Rossetti, Pharm.D., BCPS '[]'$  [place], Pharm.D., BCPS, AAHIVP '[]'$  Legrand Como, Pharm.D., BCPS, AAHIVP '[]'$  Salome Arnt, PharmD, BCPS '[]'$  Johnnette Gourd, PharmD, BCPS '[]'$  Hughes Better, PharmD, BCPS '[]'$  Leeroy Cha, PharmD  Positive urine culture  '[]'$  Patient discharged without antimicrobial prescription and treatment is now indicated '[]'$  Organism is resistant to prescribed ED discharge antimicrobial '[]'$  Patient with positive blood cultures  Changes discussed with ED provider: Janeece Fitting [facility] New antibiotic prescription symptom check, if improved continue keflex, if not improved switch to Cefdinir '300mg'$  po q 12 hours to complete 7 day course, son states symptoms are much improved, no new rx called, will contine keflex until done   Spoke with son [name] who speaks [place] and is listed as emergency contact   Hazle Nordmann 04/10/2022, 11:22 AM

## 2022-04-26 ENCOUNTER — Other Ambulatory Visit: Payer: Self-pay | Admitting: Nurse Practitioner

## 2022-06-01 ENCOUNTER — Encounter (INDEPENDENT_AMBULATORY_CARE_PROVIDER_SITE_OTHER): Payer: Self-pay

## 2022-07-05 ENCOUNTER — Other Ambulatory Visit: Payer: Self-pay | Admitting: Family Medicine

## 2022-07-05 DIAGNOSIS — Z1231 Encounter for screening mammogram for malignant neoplasm of breast: Secondary | ICD-10-CM

## 2022-07-07 ENCOUNTER — Encounter: Payer: Self-pay | Admitting: Orthopaedic Surgery

## 2022-07-07 ENCOUNTER — Ambulatory Visit (INDEPENDENT_AMBULATORY_CARE_PROVIDER_SITE_OTHER): Payer: 59 | Admitting: Orthopaedic Surgery

## 2022-07-07 ENCOUNTER — Ambulatory Visit (INDEPENDENT_AMBULATORY_CARE_PROVIDER_SITE_OTHER): Payer: 59

## 2022-07-07 VITALS — Ht 60.0 in | Wt 218.0 lb

## 2022-07-07 DIAGNOSIS — M1712 Unilateral primary osteoarthritis, left knee: Secondary | ICD-10-CM

## 2022-07-07 DIAGNOSIS — Z6841 Body Mass Index (BMI) 40.0 and over, adult: Secondary | ICD-10-CM | POA: Diagnosis not present

## 2022-07-07 NOTE — Progress Notes (Signed)
Office Visit Note   Patient: Amy Roman           Date of Birth: 1951/10/05           MRN: [id number] Visit Date: 07/07/2022              Requested by: Lin Landsman, [address],  [place] [id number] PCP: Lin Landsman, MD   Assessment & Plan: Visit Diagnoses:  1. Primary osteoarthritis of left knee     Plan: Impression is end-stage degenerative joint disease left knee.  Today, we again discussed various treatment options to include repeat cortisone injection versus viscosupplementation injection versus total knee arthroplasty.  She is not interested in cortisone injection as this has not helped in the past.  She does not feel that the gel injection will help either.  Unfortunately, she currently has a BMI of 42.58 and will need to get to a weight of 200 pounds in order to attain a BMI of less than 40 to safely proceed with total knee arthroplasty.  We discussed referral to weight loss clinic for which she was agreeable to.  She will follow-up with [place] as needed.  The patient meets the AMA guidelines for Morbid (severe) obesity with a BMI > 40.0 and I have recommended weight loss.  Follow-Up Instructions: Return if symptoms worsen or fail to improve.   Orders:  Orders Placed This Encounter  Procedures   XR KNEE 3 VIEW LEFT   No orders of the defined types were placed in this encounter.     Procedures: No procedures performed   Clinical Data: No additional findings.   Subjective: Chief Complaint  Patient presents with   Left Knee - Pain    HPI patient is a pleasant 71 year old Arabic female who is here today with interpreter.  She is here with chronic left knee pain.  History of advanced degenerative joint disease.  She is having intermittent pain worse going from seated to standing position as well as when she is moving in bed at night.  She has been taking ibuprofen without significant relief.  She has previously undergone cortisone injections  without relief.  Review of Systems as detailed in HPI.  All others reviewed and are negative.   Objective: Vital Signs: Ht 5' (1.524 m)   Wt 218 lb (98.9 kg)   BMI 42.58 kg/m   Physical Exam well-developed well-nourished female no acute distress.  Alert and oriented x3.  Ortho Exam unchanged left knee exam  Specialty Comments:  No specialty comments available.  Imaging: No results found.   PMFS History: Patient Active Problem List   Diagnosis Date Noted   Primary osteoarthritis of left knee 04/21/2021   Status post total knee replacement, right 09/14/2020   Primary osteoarthritis of right knee 09/13/2020   Iron deficiency anemia 07/23/2018   Breast cancer metastasized to axillary lymph node, right ([place]) 04/17/2018   Malignant neoplasm of 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, estrogen receptor positive ([place]) 03/11/2018   Post-operative state 03/06/2018   Uterine hyperplasia 10/11/2017   Anxiety and depression 10/15/2014   Vitamin B 12 deficiency 06/02/2014   Vitamin D deficiency 06/02/2014   Hereditary and idiopathic peripheral neuropathy 05/14/2014   Past Medical History:  Diagnosis Date   Anemia    Anxiety and depression 10/15/2014   Arthritis    BILATERAL KNEES, HAD CORTISONE INJECTION 03/2017   Brain cancer ([place])    Breast cancer ([place]) 2019   Right Breast  Cancer   Depression    GERD (gastroesophageal reflux disease)    OCCASIONAL WITH CERTAIN FOOD   Headache    Heart murmur    History of radiation therapy 05/28/18- 07/10/18   Right Breast 25 fractions for a total dose of 50 Gy, Right SCV and PAB 25 fractions for a total dose of 50 Gy, right breast boost, 5 fractions for a total dose of 10 Gy.    Personal history of radiation therapy    Right Breast Cancer   Seasonal allergies    SVD (spontaneous vaginal delivery)    x 4   Unspecified hereditary and idiopathic peripheral neuropathy 05/14/2014   LEFT KNEE AND FOOT    Family History  Problem  Relation Age of Onset   Diabetes Mother    Neuropathy Mother    Other Father    Neuropathy Sister    Cancer Sister        unknown type cancer in neck    Cancer Maternal Aunt        leukemia    Colon cancer Neg Hx    Breast cancer Neg Hx     Past Surgical History:  Procedure Laterality Date   ABDOMINAL HYSTERECTOMY     AXILLARY LYMPH NODE DISSECTION Right 04/17/2018   Procedure: AXILLARY LYMPH NODE DISSECTION;  Surgeon: Stark Klein, MD;  Location: [facility];  Service: General;  Laterality: Right;   BREAST EXCISIONAL BIOPSY Right 2007   BREAST LUMPECTOMY Right 2019   BREAST LUMPECTOMY WITH RADIOACTIVE SEED AND SENTINEL LYMPH NODE BIOPSY Right 04/05/2018   Procedure: RIGHT BREAST SEED BRACKETED LUMPECTOMY WITH SENTINEL LYMPH NODE BIOPSY;  Surgeon: Stark Klein, MD;  Location: [facility];  Service: General;  Laterality: Right;   BREAST SURGERY     bx, no cancer    CATARACT EXTRACTION Bilateral    DILATATION & CURETTAGE/HYSTEROSCOPY WITH MYOSURE N/A 08/09/2017   Procedure: DILATATION & CURETTAGE/HYSTEROSCOPY WITH MYOSURE;  Surgeon: Emily Filbert, MD;  Location: [facility];  Service: Gynecology;  Laterality: N/A;   LABIOPLASTY  03/06/2018   Procedure: LABIAPLASTY;  Surgeon: Emily Filbert, MD;  Location: [facility];  Service: Gynecology;;   TOTAL KNEE ARTHROPLASTY Right 09/14/2020   Procedure: RIGHT TOTAL KNEE ARTHROPLASTY;  Surgeon: Leandrew Koyanagi, MD;  Location: [facility];  Service: Orthopedics;  Laterality: Right;   VAGINAL HYSTERECTOMY Bilateral 03/06/2018   Procedure: HYSTERECTOMY VAGINAL WITH BILATERAL SALPINGOOPHORECTOMY;  Surgeon: Emily Filbert, MD;  Location: [facility];  Service: Gynecology;  Laterality: Bilateral;   Social History   Occupational History   Occupation: house   Tobacco Use   Smoking status: Never   Smokeless tobacco: Never  Vaping Use   Vaping Use: Never used  Substance and Sexual Activity   Alcohol use: No   Drug use: No   Sexual activity: Not  on file

## 2022-07-19 NOTE — Progress Notes (Unsigned)
[place]   [phone] [phone]   Clinic Follow up Note   Patient Care Team: Lin Landsman, MD as PCP - General (Family Medicine) Truitt Merle, MD as Consulting Physician (Hematology) Stark Klein, MD as Consulting Physician (General Surgery) Eppie Gibson, MD as Attending Physician (Radiation Oncology) Alla Feeling, NP as Nurse Practitioner (Nurse Practitioner) 07/20/2022  CHIEF COMPLAINT: Follow-up right breast cancer  SUMMARY OF ONCOLOGIC HISTORY: Oncology History Overview Note  Cancer Staging Malignant neoplasm of 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, estrogen receptor positive ([place]) Staging form: Breast, AJCC 8th Edition - Clinical stage from 02/16/2018: Stage IB (cT2(m), cN0, cM0, G2, ER+, PR+, HER2-) - Signed by Truitt Merle, MD on 03/11/2018    Malignant neoplasm of 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, estrogen receptor positive ([place])  01/30/2018 Mammogram   Screening mammogram FINDINGS: In the right breast, possible distortion warrants further evaluation. The patient has had an excisional biopsy of her right breast upper outer quadrant in 2007, however this distortion is either better seen or new from her prior mammogram, and it does not quite match the surgical site as marked by a skin scar marker. In the left breast, no findings suspicious for malignancy.   02/14/2018 Mammogram   Diagnostic mammogram: In the lateral aspect of the right breast, posterior depth there is a prominent area of distortion at the approximate 9 o'clock location. While this is in the region of the excisional biopsy that the patient had in 2007, the distortion has become significantly more prominent as compared to the tomosynthesis mammogram performed in September of 2016, and appears to be inferior to the surgical scar.   02/14/2018 Breast US   Ultrasound of the right breast at 8:30, 4 cm from the nipple demonstrates an irregular hypoechoic  vascular mass with spiculated margins measuring 2.1 x 2.0 x 1.8 cm. There is a small adjacent mass at 9 o'clock, 4 cm from the nipple, which is 1.2 cm away from the dominant mass. The smaller mass measures 1.1 x 0.7 x 0.7 cm. Together, the 2 masses span 3.6 cm of tissue. Ultrasound of the right axilla demonstrates multiple normal-appearing lymph nodes.   IMPRESSION: 1. There is a highly suspicious mass in the right breast at 830 with a small possible satellite lesion at 9 o'clock. 2.  No evidence of right axillary lymphadenopathy.   02/16/2018 Cancer Staging   Staging form: Breast, AJCC 8th Edition - Clinical stage from 02/16/2018: Stage IB (cT2(m), cN0, cM0, G2, ER+, PR+, HER2-) - Signed by Truitt Merle, MD on 03/11/2018   02/16/2018 Initial Biopsy   Diagnosis 1. Breast, right, needle core biopsy, 8:30 o'clock - INVASIVE DUCTAL CARCINOMA, SEE COMMENT. 2. Breast, right, needle core biopsy, 9 o'clock - INVASIVE DUCTAL CARCINOMA. - LOBULAR NEOPLASIA (ATYPICAL LOBULAR HYPERPLASIA).  ER: 95%, positive, strong staining PR: 80%, positive, moderate staining  Proliferation Marker Ki67: 2%  HER2 - Negative Ratio of HER2/CEP17 signals: 1.80 Average HER2 copy number per cell: 2.97    03/01/2018 Imaging   MR Bilateral breast IMPRESSION: Two areas of known malignancy in the LATERAL portion of the RIGHT breast. No additional areas of concern in either breast. No axillary adenopathy.   03/11/2018 Initial Diagnosis   Malignant neoplasm of 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, estrogen receptor positive ([place])   04/05/2018 Surgery   RIGHT BREAST SEED BRACKETED LUMPECTOMY WITH SENTINEL LYMPH NODE BIOPSY by Dr. Barry Dienes 04/05/18   04/05/2018 Pathology Results   Surgery  Diagnosis  04/05/18  1. Breast, lumpectomy, right - INVASIVE DUCTAL CARCINOMA GRADE I/III, TWO FOCI SPANNING 2.9 CM AND 0.9 CM. - LOBULAR NEOPLASIA (LOBULAR CARCINOMA IN SITU). - DUCTAL CARCINOMA IN SITU, LOW GRADE. -  LYMPHOVASCULAR INVASION IS IDENTIFIED. - THE SURGICAL RESECTION MARGINS ARE NEGATIVE FOR DUCTAL CARCINOMA. - SEE ONCOLOGY TABLE BELOW. 2. Lymph node, sentinel, biopsy, right - THERE IS NO EVIDENCE OF CARCINOMA IN 1 OF 1 LYMPH NODE (0/1). 3. Lymph node, sentinel, biopsy, right - METASTATIC CARCINOMA IN 1 OF 1 LYMPH NODE (1/1) WITH EXTRACAPSULAR EXTENSION. - EXTRACAPSULAR EXTENSION EXTENDS 0.3 CM. 4. Lymph node, sentinel, biopsy, right - DUCTAL CARCINOMA. - PERINEURAL INVASION IS IDENTIFIED. - SEE COMMENT. 5. Lymph node, sentinel, biopsy, right 1 of 4   04/05/2018 Miscellaneous   Mammaprint Low Risk with 10% risk of recurrance with Tamoxifen alone  MPI at +0.240 DMFI at 97.8%   04/12/2018 Cancer Staging   Staging form: Breast, AJCC 8th Edition - Pathologic: Stage IA (pT2, pN1a, cM0, G1, ER+, PR+, HER2-) - Signed by Truitt Merle, MD on 04/12/2018   04/13/2018 Imaging   Whole Body Bone scan 04/13/18 IMPRESSION: No scintigraphic evidence of osseous metastatic disease.   04/16/2018 Imaging   CT CAP W Contrast 04/16/18  IMPRESSION: 1. No definite findings of metastatic disease in the chest. Solitary solid 4 mm right lower lobe pulmonary nodule, for which initial chest CT follow-up is advised in 3 months. 2. Postsurgical changes from right breast lumpectomy and right axillary node dissection. Thin-walled fluid collections in the right breast and right axilla are most compatible with postsurgical seromas. 3. No evidence of metastatic disease in the abdomen or pelvis.    04/17/2018 Surgery   AXILLARY LYMPH NODE DISSECTION by Dr. Barry Dienes 04/17/18     04/17/2018 Pathology Results   Diagnosis 04/17/18  Lymph nodes, regional resection, Right Axillary - BENIGN FIBROADIPOSE AND BREAST TISSUE WITH FAT NECROSIS. - NO LYMPHOID TISSUE PRESENT. - NO MALIGNANCY IDENTIFIED. Microscopic Comment The entire specimen was submitted.   05/28/2018 - 07/10/2018 Radiation Therapy   Radiation treatment dates:    05/28/2018-07/10/2018   Site/dose:    1. Right breast, 2 Gy in 25 fractions for a total dose of 50 Gy 2. Right SCV, 2 Gy in 25 fractions for a total dose of 50 Gy 3. Right breast boost, 2 Gy in 5 fractions for a total dose of 5 Gy           07/24/2018 -  Anti-estrogen oral therapy   Letrozole 2.61m daily starting 07/24/18    Survivorship   Per LCira Rue NP      CURRENT THERAPY:  Letrozole starting 07/2018, goal is 5-10 years Changed to tamoxifen 12/2021 due to osteoporosis    INTERVAL HISTORY: Ms. HLorenda Hatchetreturns for follow-up as scheduled, last seen by me 01/04/2022.  The plan was to start Zometa after dental clearance which has been difficult to obtain therefore she has not started. She also tells me she did not wean off prozac as we discussed, takes that plus amitriptyline and trazodone for depression which is helping her. She was told to loose weight for qualify for knee replacement, she plans to start exercising at the gym. Denies breast concerns such as new lump/mass, nipple discharge or inversion, or skins change. The most bothersome symptom at this time is urinary leakage, more at night and upon waking. She was prescribed antibiotic for UTI which has resolved but she has persistent leakage. Her mammogram is scheduled to be done later today.  All other systems were reviewed with the patient and are negative.  MEDICAL HISTORY:  Past Medical History:  Diagnosis Date   Anemia    Anxiety and depression 10/15/2014   Arthritis    BILATERAL KNEES, HAD CORTISONE INJECTION 03/2017   Brain cancer ([place])    Breast cancer ([place]) 2019   Right Breast Cancer   Depression    GERD (gastroesophageal reflux disease)    OCCASIONAL WITH CERTAIN FOOD   Headache    Heart murmur    History of radiation therapy 05/28/18- 07/10/18   Right Breast 25 fractions for a total dose of 50 Gy, Right SCV and PAB 25 fractions for a total dose of 50 Gy, right breast boost, 5 fractions for a total dose of 10 Gy.     Personal history of radiation therapy    Right Breast Cancer   Seasonal allergies    SVD (spontaneous vaginal delivery)    x 4   Unspecified hereditary and idiopathic peripheral neuropathy 05/14/2014   LEFT KNEE AND FOOT    SURGICAL HISTORY: Past Surgical History:  Procedure Laterality Date   ABDOMINAL HYSTERECTOMY     AXILLARY LYMPH NODE DISSECTION Right 04/17/2018   Procedure: AXILLARY LYMPH NODE DISSECTION;  Surgeon: Stark Klein, MD;  Location: [facility];  Service: General;  Laterality: Right;   BREAST EXCISIONAL BIOPSY Right 2007   BREAST LUMPECTOMY Right 2019   BREAST LUMPECTOMY WITH RADIOACTIVE SEED AND SENTINEL LYMPH NODE BIOPSY Right 04/05/2018   Procedure: RIGHT BREAST SEED BRACKETED LUMPECTOMY WITH SENTINEL LYMPH NODE BIOPSY;  Surgeon: Stark Klein, MD;  Location: [place];  Service: General;  Laterality: Right;   BREAST SURGERY     bx, no cancer    CATARACT EXTRACTION Bilateral    DILATATION & CURETTAGE/HYSTEROSCOPY WITH MYOSURE N/A 08/09/2017   Procedure: [place];  Surgeon: Emily Filbert, MD;  Location: [facility];  Service: Gynecology;  Laterality: N/A;   LABIOPLASTY  03/06/2018   Procedure: LABIAPLASTY;  Surgeon: Emily Filbert, MD;  Location: [facility];  Service: Gynecology;;   TOTAL KNEE ARTHROPLASTY Right 09/14/2020   Procedure: RIGHT TOTAL KNEE ARTHROPLASTY;  Surgeon: Leandrew Koyanagi, MD;  Location: [place];  Service: Orthopedics;  Laterality: Right;   VAGINAL HYSTERECTOMY Bilateral 03/06/2018   Procedure: HYSTERECTOMY VAGINAL WITH BILATERAL SALPINGOOPHORECTOMY;  Surgeon: Emily Filbert, MD;  Location: [facility];  Service: Gynecology;  Laterality: Bilateral;    I have reviewed the social history and family history with the patient and they are unchanged from previous note.  ALLERGIES:  has No Known Allergies.  MEDICATIONS:  Current Outpatient Medications  Medication Sig Dispense Refill   amitriptyline  (ELAVIL) 25 MG tablet TAKE 2 TABLETS(50 MG) BY MOUTH AT BEDTIME. MAY INCREASE TO 75 MG AT BEDTIME AFTER 2 WEEKS 60 tablet 3   aspirin EC 81 MG tablet Take 1 tablet (81 mg total) by mouth 2 (two) times daily. To be taken after surgery to prevent blood clots 84 tablet 0   b complex vitamins tablet Take 1 tablet by mouth daily.     cephALEXin (KEFLEX) 500 MG capsule Take 1 capsule (500 mg total) by mouth 4 (four) times daily. 28 capsule 0   cholecalciferol (VITAMIN D3) 25 MCG (1000 UNIT) tablet Take 1,000 Units by mouth daily.     ferrous sulfate 325 (65 FE) MG EC tablet Take 325 mg by mouth 3 (three) times daily with meals.     FLUoxetine (PROZAC) 40  MG capsule Take 40 mg by mouth every morning.     gabapentin (NEURONTIN) 300 MG capsule Take 1 capsule (300 mg total) by mouth 3 (three) times daily. 90 capsule 3   hydrOXYzine (ATARAX/VISTARIL) 10 MG tablet Take 10 mg by mouth 3 (three) times daily as needed for anxiety.      methocarbamol (ROBAXIN) 500 MG tablet Take 1 tablet (500 mg total) by mouth 2 (two) times daily as needed. To be taken after surgery as needed 20 tablet 0   omeprazole (PRILOSEC) 40 MG capsule TAKE 1 CAPSULE(40 MG) BY MOUTH TWICE DAILY FOR 10 DAYS 20 capsule 0   ondansetron (ZOFRAN) 4 MG tablet Take 1 tablet (4 mg total) by mouth every 8 (eight) hours as needed for nausea or vomiting. 40 tablet 0   oxyCODONE-acetaminophen (PERCOCET) 5-325 MG tablet Take 1-2 tablets by mouth every 6 (six) hours as needed. 40 tablet 0   promethazine (PHENERGAN) 25 MG tablet Take 1 tablet (25 mg total) by mouth every 6 (six) hours as needed for nausea. 30 tablet 1   tamoxifen (NOLVADEX) 20 MG tablet TAKE 1 TABLET(20 MG) BY MOUTH DAILY 30 tablet 3   traZODone (DESYREL) 100 MG tablet Take 100 mg by mouth at bedtime.     vitamin B-12 (CYANOCOBALAMIN) 500 MCG tablet Take 500 mcg by mouth daily.     No current facility-administered medications for this visit.    PHYSICAL EXAMINATION: ECOG PERFORMANCE  STATUS: 1 - Symptomatic but completely ambulatory  Vitals:   07/20/22 0956  BP: 138/64  Pulse: 99  Resp: 15  Temp: 98.6 F (37 C)  SpO2: 96%   Filed Weights   07/20/22 0956  Weight: 217 lb (98.4 kg)    GENERAL:alert, no distress and comfortable SKIN: no rash  EYES: sclera clear NECK: Without mass LYMPH:  no palpable cervical or supraclavicular lymphadenopathy  LUNGS: normal breathing effort NEURO: alert & oriented x 3 with fluent speech Breast exam: Breasts are symmetrical without nipple discharge or inversion.  S/p right lumpectomy, incisions completely healed with moderate scar tissue.  No palpable mass or nodularity in either breast or axilla that I could appreciate.  LABORATORY DATA:  I have reviewed the data as listed    Latest Ref Rng & Units 07/20/2022    9:24 AM 04/07/2022    3:01 PM 12/07/2021    9:49 AM  CBC  WBC 4.0 - 10.5 K/uL 4.4  8.3  4.8   Hemoglobin 12.0 - 15.0 g/dL 12.5  13.4  12.4   Hematocrit 36.0 - 46.0 % 38.3  41.8  37.7   Platelets 150 - 400 K/uL 197  243  254         Latest Ref Rng & Units 07/20/2022    9:24 AM 04/07/2022    3:01 PM 12/07/2021   10:56 AM  CMP  Glucose 70 - 99 mg/dL 91  103  89   BUN 8 - 23 mg/dL '25  22  27   ' Creatinine 0.44 - 1.00 mg/dL 1.19  0.99  1.02   Sodium 135 - 145 mmol/L 141  141  137   Potassium 3.5 - 5.1 mmol/L 4.6  4.8  5.3   Chloride 98 - 111 mmol/L 105  111  103   CO2 22 - 32 mmol/L '31  25  21   ' Calcium 8.9 - 10.3 mg/dL 9.3  9.5  9.6   Total Protein 6.5 - 8.1 g/dL 6.9  7.8  6.4   Total Bilirubin  0.3 - 1.2 mg/dL 0.4  0.6  0.2   Alkaline Phos 38 - 126 U/L 73  96  123   AST 15 - 41 U/L '19  31  25   ' ALT 0 - 44 U/L '13  18  17       ' RADIOGRAPHIC STUDIES: I have personally reviewed the radiological images as listed and agreed with the findings in the report. No results found.   ASSESSMENT & PLAN:  Amy Roman is a 71 y.o. African female     1.  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 oncotype showed low risk, adjuvant chemo was not recommended.  -began adjuvant AI with letrozole in 07/2018, tolerating well with stable joint pain.  She switched to tamoxifen 11/2021 due to osteoporosis.  Goal of therapy is a total of 5-10 years -Ms. [name] Lorenda Hatchet is clinically doing well.  Tolerating tamoxifen.  Breast exam is benign, labs are stable.  I encouraged her to hydrate.  No clinical concern for breast cancer recurrence.  Mammogram is scheduled for later today, will follow  -She is 4 years out from definitive treatment, the recurrence risk has decreased.  Continue surveillance and tamoxifen -Follow-up in 6 months, or sooner if needed  2. Bone Health -baseline DEXA 10/29/19 showed osteopenia T score -2 at AP spine. Low frax score at hip with 1% fracture risk in 10 years -On 11/16/2021 DEXA osteopenia worsened to osteoporosis at the AP spine, T score -2.5.  There was mild worsening bone density at the femur as well.  FRAX score not included -We discussed AI's can worsen bone density, and given her knee pain and recent fall she is at high risk for fracture -She stopped letrozole and began tamoxifen in 11/2021, tolerating well  -We have not been able to obtain dental clearance from Dr. Burnard Bunting, and given mild renal dysfunction Zometa is on hold -Continue calcium, vitamin D, and weightbearing exercise as tolerated given severe arthritis  3.  Urinary leakage -Recently seen in ED 04/07/2022 for E. coli UTI and completed antibiotics -He reports persistent urinary leakage, worse at night and upon awakening -Could be from estrogen deprivation -We will refer to female GYN   4. anxiety/depression -Stable on fluoxetine, Elavil, and trazodone -I previously recommended to wean off fluoxetine given the potential DDI with tamoxifen but she has not done  so.   -Given her good tolerance to this regimen for many years I am not making changes at this time.  We will monitor closely given potential DDI with tamoxifen  5. Iron and B12 deficiency anemia -she developed mild anemia Hg 10.3 in 02/2018, worsened to 9.2 in 06/2018 with work up showing iron 22, TIBC 505, 4% sat, and ferritin 9, normal B12, MMA, and folate. consistent with IDA -She did have B12 196 which is low range/normal about 6 years ago. On oral B12 500 mcg daily  -colonoscopy 09/2018 (Nandigam) showed diverticulosis and non bleeding internal hemorrhoids. She source of her IDA has not been confirmed.  -she did not tolerate oral iron and declined RBC transfusion -she received IV Feraheme in the past, responded well.  -Resolved   6. Neuropathy, osteoarthritis -She had significant arthritis in her b/l knees; s/p right knee replacement in 08/2020 -followed by Dr. Mickeal Skinner for neuropathy, stable on oral B12 and gabapentin -she can't exercise much due to knee pain, she  has tried to lose weight on her own without much success.  -I previously referred her to medical weight loss management and Ortho referred her back recently -Refilled amitriptyline   7. Lung nodules -Found on 04/16/18 CT scan, stable on 02/2020 repeat CT chest   Plan: -Labs reviewed -Cancel zometa -Continue breast cancer surveillance and tamoxifen -Refilled amitriptyline -Referral to gyn for urinary leakage -Mammogram today as scheduled -Follow-up in 6 months, or sooner if needed   Orders Placed This Encounter  Procedures   Ambulatory referral to Gynecology    Referral Priority:   Routine    Referral Type:   Consultation    Referral Reason:   Specialty Services Required    Requested Specialty:   Gynecology    Number of Visits Requested:   1   All questions were answered. The patient knows to call the clinic with any problems, questions or concerns. No barriers to learning were detected. I spent 20 minutes  counseling the patient face to face. The total time spent in the appointment was 30 minutes and more than 50% was on counseling and review of test results     Alla Feeling, NP 07/20/22

## 2022-07-20 ENCOUNTER — Other Ambulatory Visit: Payer: Self-pay

## 2022-07-20 ENCOUNTER — Inpatient Hospital Stay: Payer: 59

## 2022-07-20 ENCOUNTER — Inpatient Hospital Stay: Payer: 59 | Attending: Nurse Practitioner | Admitting: Nurse Practitioner

## 2022-07-20 ENCOUNTER — Encounter: Payer: Self-pay | Admitting: Nurse Practitioner

## 2022-07-20 ENCOUNTER — Ambulatory Visit
Admission: RE | Admit: 2022-07-20 | Discharge: 2022-07-20 | Disposition: A | Discharge status: Home / Self Care | Payer: 59 | Source: Ambulatory Visit | Attending: Family Medicine | Admitting: Family Medicine

## 2022-07-20 VITALS — BP 138/64 | HR 99 | Temp 98.6°F | Resp 15 | Wt 217.0 lb

## 2022-07-20 DIAGNOSIS — Z923 Personal history of irradiation: Secondary | ICD-10-CM | POA: Diagnosis not present

## 2022-07-20 DIAGNOSIS — Z170 Estrogen receptor positive status [ER+]: Secondary | ICD-10-CM

## 2022-07-20 DIAGNOSIS — D509 Iron deficiency anemia, unspecified: Secondary | ICD-10-CM

## 2022-07-20 DIAGNOSIS — Z7982 Long term (current) use of aspirin: Secondary | ICD-10-CM | POA: Diagnosis not present

## 2022-07-20 DIAGNOSIS — C50511 Malignant neoplasm of lower-outer quadrant of right female breast: Secondary | ICD-10-CM | POA: Diagnosis present

## 2022-07-20 DIAGNOSIS — R32 Unspecified urinary incontinence: Secondary | ICD-10-CM | POA: Diagnosis not present

## 2022-07-20 DIAGNOSIS — Z79811 Long term (current) use of aromatase inhibitors: Secondary | ICD-10-CM | POA: Diagnosis not present

## 2022-07-20 DIAGNOSIS — Z1231 Encounter for screening mammogram for malignant neoplasm of breast: Secondary | ICD-10-CM

## 2022-07-20 DIAGNOSIS — Z79899 Other long term (current) drug therapy: Secondary | ICD-10-CM | POA: Insufficient documentation

## 2022-07-20 LAB — CMP (CANCER CENTER ONLY)
ALT: 13 U/L (ref 0–44)
AST: 19 U/L (ref 15–41)
Albumin: 3.9 g/dL (ref 3.5–5.0)
Alkaline Phosphatase: 73 U/L (ref 38–126)
Anion gap: 5 (ref 5–15)
BUN: 25 mg/dL — ABNORMAL HIGH (ref 8–23)
CO2: 31 mmol/L (ref 22–32)
Calcium: 9.3 mg/dL (ref 8.9–10.3)
Chloride: 105 mmol/L (ref 98–111)
Creatinine: 1.19 mg/dL — ABNORMAL HIGH (ref 0.44–1.00)
GFR, Estimated: 49 mL/min — ABNORMAL LOW (ref >60)
Glucose, Bld: 91 mg/dL (ref 70–99)
Potassium: 4.6 mmol/L (ref 3.5–5.1)
Sodium: 141 mmol/L (ref 135–145)
Total Bilirubin: 0.4 mg/dL (ref 0.3–1.2)
Total Protein: 6.9 g/dL (ref 6.5–8.1)

## 2022-07-20 LAB — CBC WITH DIFFERENTIAL (CANCER CENTER ONLY)
Abs Immature Granulocytes: 0.01 10*3/uL (ref 0.00–0.07)
Basophils Absolute: 0 10*3/uL (ref 0.0–0.1)
Basophils Relative: 1 %
Eosinophils Absolute: 0.1 10*3/uL (ref 0.0–0.5)
Eosinophils Relative: 2 %
HCT: 38.3 % (ref 36.0–46.0)
Hemoglobin: 12.5 g/dL (ref 12.0–15.0)
Immature Granulocytes: 0 %
Lymphocytes Relative: 32 %
Lymphs Abs: 1.4 10*3/uL (ref 0.7–4.0)
MCH: 30.2 pg (ref 26.0–34.0)
MCHC: 32.6 g/dL (ref 30.0–36.0)
MCV: 92.5 fL (ref 80.0–100.0)
Monocytes Absolute: 0.7 10*3/uL (ref 0.1–1.0)
Monocytes Relative: 16 %
Neutro Abs: 2.2 10*3/uL (ref 1.7–7.7)
Neutrophils Relative %: 49 %
Platelet Count: 197 10*3/uL (ref 150–400)
RBC: 4.14 MIL/uL (ref 3.87–5.11)
RDW: 14.2 % (ref 11.5–15.5)
WBC Count: 4.4 10*3/uL (ref 4.0–10.5)
nRBC: 0 % (ref 0.0–0.2)

## 2022-07-20 LAB — FERRITIN: Ferritin: 25 ng/mL (ref 11–307)

## 2022-07-20 LAB — IRON AND IRON BINDING CAPACITY (CC-WL,HP ONLY)
Iron: 144 ug/dL (ref 28–170)
Saturation Ratios: 29 % (ref 10.4–31.8)
TIBC: 503 ug/dL — ABNORMAL HIGH (ref 250–450)
UIBC: 359 ug/dL (ref 148–442)

## 2022-07-20 MED ORDER — AMITRIPTYLINE HCL 25 MG PO TABS: ORAL_TABLET | ORAL | 3 refills | Status: AC

## 2022-07-21 LAB — CANCER ANTIGEN 27.29: CA 27.29: 29.7 U/mL (ref 0.0–38.6)

## 2022-07-27 ENCOUNTER — Other Ambulatory Visit: Payer: Self-pay | Admitting: Nurse Practitioner

## 2022-08-04 ENCOUNTER — Telehealth: Payer: Self-pay

## 2022-08-04 NOTE — Telephone Encounter (Signed)
Returned the patients son Ahmeds call regarding concerns over patients last mammogram results where it states the breast tissue is dense and it may obscure small masses. This LPN spoke with Cira Rue, NP and she stated the results were negative and everything looks good and the reason it mentions that is due to the fact she has dense breast tissue and she has no concerns over the results. I relayed this to the son [name] and he verbalized understanding and will pass this information on to the patient.

## 2022-08-11 ENCOUNTER — Telehealth: Payer: Self-pay | Admitting: Hematology

## 2022-08-11 NOTE — Telephone Encounter (Signed)
Left message with follow-up appointment per 9/27 los.

## 2022-08-22 ENCOUNTER — Encounter (INDEPENDENT_AMBULATORY_CARE_PROVIDER_SITE_OTHER): Payer: Self-pay

## 2022-12-10 ENCOUNTER — Other Ambulatory Visit: Payer: Self-pay | Admitting: Nurse Practitioner

## 2022-12-24 ENCOUNTER — Encounter (HOSPITAL_COMMUNITY): Payer: Self-pay

## 2022-12-24 ENCOUNTER — Emergency Department (HOSPITAL_COMMUNITY)
Admission: EM | Admit: 2022-12-24 | Discharge: 2022-12-24 | Disposition: A | Discharge status: Home / Self Care | Payer: 59 | Attending: Emergency Medicine | Admitting: Emergency Medicine

## 2022-12-24 ENCOUNTER — Other Ambulatory Visit: Payer: Self-pay

## 2022-12-24 DIAGNOSIS — Z7982 Long term (current) use of aspirin: Secondary | ICD-10-CM | POA: Insufficient documentation

## 2022-12-24 DIAGNOSIS — N3001 Acute cystitis with hematuria: Secondary | ICD-10-CM | POA: Insufficient documentation

## 2022-12-24 DIAGNOSIS — R319 Hematuria, unspecified: Secondary | ICD-10-CM | POA: Diagnosis present

## 2022-12-24 LAB — URINALYSIS, ROUTINE W REFLEX MICROSCOPIC
Bacteria, UA: NONE SEEN
Bilirubin Urine: NEGATIVE
Glucose, UA: NEGATIVE mg/dL
Ketones, ur: 5 mg/dL — AB
Nitrite: NEGATIVE
Protein, ur: 100 mg/dL — AB
RBC / HPF: >50 RBC/hpf (ref 0–5)
Specific Gravity, Urine: 1.019 (ref 1.005–1.030)
WBC, UA: >50 WBC/hpf (ref 0–5)
pH: 5 (ref 5.0–8.0)

## 2022-12-24 MED ORDER — CEPHALEXIN 500 MG PO CAPS: 500.0000 mg | ORAL_CAPSULE | Freq: Four times a day (QID) | ORAL | 0 refills | Status: DC

## 2022-12-24 MED ORDER — CEPHALEXIN 500 MG PO CAPS: 500.0000 mg | ORAL_CAPSULE | Freq: Four times a day (QID) | ORAL | 0 refills | Status: AC

## 2022-12-24 NOTE — ED Provider Notes (Signed)
[facility] Provider Note   CSN: [id number] Arrival date & time: 12/24/22  1719     History  Chief Complaint  Patient presents with   Hematuria    Amy Roman is a 72 y.o. female.  72 year old female with no past medical history presents to the ED with a chief complaint of hematuria, urinary urgency along with frequency that is been ongoing for the past 3 days.  Patient reports she first noted her symptoms with some urgency, states that she tried to make it to the bathroom however could not make it there, feels that she cannot control her bladder.  She has no prior history of UTIs, however reports that in the past there has not been any blood present.  She has not taken any medication for improvement in her symptoms.  She does endorse back pain, however this appears to be more so lumbar spine.  She is not having any nausea, no vomiting, no fevers.  The history is provided by the patient and medical records.  Hematuria Pertinent negatives include no chest pain, no abdominal pain and no shortness of breath.       Home Medications Prior to Admission medications   Medication Sig Start Date End Date Taking? Authorizing Provider  cephALEXin (KEFLEX) 500 MG capsule Take 1 capsule (500 mg total) by mouth 4 (four) times daily for 7 days. 12/24/22 12/31/22 Yes Myrakle Wingler, Beverley Fiedler, PA-C  amitriptyline (ELAVIL) 25 MG tablet TAKE 2 TABLETS(50 MG) BY MOUTH AT BEDTIME. MAY INCREASE TO 75 MG AT BEDTIME AFTER 2 WEEKS 07/20/22   Alla Feeling, NP  aspirin EC 81 MG tablet Take 1 tablet (81 mg total) by mouth 2 (two) times daily. To be taken after surgery to prevent blood clots 09/11/20   Aundra Dubin, PA-C  b complex vitamins tablet Take 1 tablet by mouth daily.    [provider]  cholecalciferol (VITAMIN D3) 25 MCG (1000 UNIT) tablet Take 1,000 Units by mouth daily.    [provider]  ferrous sulfate 325 (65 FE) MG EC tablet Take  325 mg by mouth 3 (three) times daily with meals.    [provider]  FLUoxetine (PROZAC) 40 MG capsule Take 40 mg by mouth every morning. 05/04/20   [provider]  gabapentin (NEURONTIN) 300 MG capsule Take 1 capsule (300 mg total) by mouth 3 (three) times daily. 10/27/20   Leandrew Koyanagi, MD  hydrOXYzine (ATARAX/VISTARIL) 10 MG tablet Take 10 mg by mouth 3 (three) times daily as needed for anxiety.  04/04/20   [provider]  methocarbamol (ROBAXIN) 500 MG tablet Take 1 tablet (500 mg total) by mouth 2 (two) times daily as needed. To be taken after surgery as needed 09/11/20   Aundra Dubin, PA-C  omeprazole (PRILOSEC) 40 MG capsule TAKE 1 CAPSULE(40 MG) BY MOUTH TWICE DAILY FOR 10 DAYS 12/02/19   Nandigam, Venia Minks, MD  ondansetron (ZOFRAN) 4 MG tablet Take 1 tablet (4 mg total) by mouth every 8 (eight) hours as needed for nausea or vomiting. 09/11/20   Aundra Dubin, PA-C  oxyCODONE-acetaminophen (PERCOCET) 5-325 MG tablet Take 1-2 tablets by mouth every 6 (six) hours as needed. 09/29/20   Aundra Dubin, PA-C  promethazine (PHENERGAN) 25 MG tablet Take 1 tablet (25 mg total) by mouth every 6 (six) hours as needed for nausea. 10/27/20   Leandrew Koyanagi, MD  tamoxifen (NOLVADEX) 20 MG tablet TAKE 1  TABLET(20 MG) BY MOUTH DAILY 12/12/22   Alla Feeling, NP  traZODone (DESYREL) 100 MG tablet Take 100 mg by mouth at bedtime.    [provider]  vitamin B-12 (CYANOCOBALAMIN) 500 MCG tablet Take 500 mcg by mouth daily.    [provider]      Allergies    Patient has no known allergies.    Review of Systems   Review of Systems  Constitutional:  Negative for chills and fever.  Respiratory:  Negative for shortness of breath.   Cardiovascular:  Negative for chest pain.  Gastrointestinal:  Negative for abdominal pain.  Genitourinary:  Positive for dysuria, frequency and hematuria. Negative for vaginal bleeding and vaginal discharge.  All other systems  reviewed and are negative.   Physical Exam Updated Vital Signs BP (!) 157/75   Pulse 79   Temp 98.2 F (36.8 C) (Oral)   Resp 20   SpO2 97%  Physical Exam Vitals and nursing note reviewed.  Constitutional:      Appearance: Normal appearance. She is obese.  HENT:     Head: Normocephalic and atraumatic.     Mouth/Throat:     Mouth: Mucous membranes are moist.  Eyes:     Pupils: Pupils are equal, round, and reactive to light.  Cardiovascular:     Rate and Rhythm: Normal rate.  Pulmonary:     Effort: Pulmonary effort is normal.  Chest:     Chest wall: No tenderness.  Abdominal:     General: Abdomen is flat.     Palpations: Abdomen is soft.     Tenderness: There is no right CVA tenderness or left CVA tenderness.     Comments: Abdomen is soft, no guarding, no CVA tenderness bilaterally.  Musculoskeletal:     Cervical back: Normal range of motion and neck supple.  Skin:    General: Skin is warm and dry.  Neurological:     Mental Status: She is alert and oriented to person, place, and time.     ED Results / Procedures / Treatments   Labs (all labs ordered are listed, but only abnormal results are displayed) Labs Reviewed  URINALYSIS, ROUTINE W REFLEX MICROSCOPIC - Abnormal; Notable for the following components:      Result Value   Color, Urine AMBER (*)    APPearance CLOUDY (*)    Hgb urine dipstick LARGE (*)    Ketones, ur 5 (*)    Protein, ur 100 (*)    Leukocytes,Ua LARGE (*)    Non Squamous Epithelial 6-10 (*)    All other components within normal limits    EKG None  Radiology No results found.  Procedures Procedures    Medications Ordered in ED Medications - No data to display  ED Course/ Medical Decision Making/ A&P Clinical Course as of 12/24/22 1932  Sat Dec 24, 2022  [place](!): LARGE [JS]  1930 Hgb urine dipstick(!): LARGE [JS]  1930 Appearance(!): CLOUDY [JS]  1930 Leukocytes,Ua(!): LARGE [JS]  1930 WBC, UA: >50 [JS]     Clinical Course User Index [JS] Janeece Fitting, PA-C                             Medical Decision Making Amount and/or Complexity of Data Reviewed Labs:  Decision-making details documented in ED Course.  Risk Prescription drug management.    Patient presents to the ED with a chief complaint of urinary frequency, dysuria, hematuria this been  ongoing for the past 3 days.  Prior history of urinary tract infections.  No fevers, no nausea, no vomiting, no back pain.  She has not taken anything for improvement in her symptoms.  She is here requesting antibiotics to be prescribed to her.  On evaluation patient is overall nontoxic-appearing, under no discomfort or distress.  Abdomen is soft, nontender to palpation.  She does feel some pressure in her pelvic area that occurs when she urinates.  There is no CVA tenderness bilaterally, no fever.  She does have a prior visit in the month of June 2023 for lower urinary tract infection with the same symptoms.  She is known history of urinary incontinence.  Prior urine culture from June 2023 positive for E. coli.  Currently pending UA.  UA with some large leukocytes, cloudy appearance, large hemoglobin, greater than 50 white blood cell count but no nitrites.  Consistent with urinary tract infection.  No fever, no back pain, no nausea or vomiting to be concerning for pyelonephritis at this time.  Patient will go home with a short course of Keflex to help with symptomatic treatment.  She was also given a referral to [facility] as she reports she has been getting UTIs more recently.  Patient is agreeable to plan treatment, hemodynamically stable for discharge.   Portions of this note were generated with [employer]. Dictation errors may occur despite best attempts at proofreading.   Final Clinical Impression(s) / ED Diagnoses Final diagnoses:  Acute cystitis with hematuria    Rx / DC Orders ED Discharge Orders          Ordered     cephALEXin (KEFLEX) 500 MG capsule  4 times daily        12/24/22 1931              Janeece Fitting, Hershal Coria 12/24/22 1932    Davonna Belling, MD 12/24/22 2303

## 2022-12-24 NOTE — Discharge Instructions (Addendum)
Your urine did show signs consistent with a urinary tract infection.  I have placed an order for antibiotics and or to help treat your symptoms.  Please take 1 tablet 4 times a day for the next 7 days.  You were also given the number to [facility], please schedule an appointment for further evaluation due to ongoing urinary tract infections.

## 2022-12-24 NOTE — ED Triage Notes (Signed)
Pt arrived via POV, c/o blood in urine. States pain with urinating, urgency, frequency and overall trouble controlling bladder.

## 2022-12-24 NOTE — ED Notes (Signed)
Patient given discharge instructions and follow up care. Patient verbalized understanding. Patient ambulatory out of ED.

## 2022-12-27 ENCOUNTER — Telehealth: Payer: Self-pay | Admitting: Family Medicine

## 2022-12-27 NOTE — Telephone Encounter (Signed)
Reached out to patient to reschedule appointment , provider out of office that day. Left voicemail for patient to call back.

## 2023-01-19 ENCOUNTER — Other Ambulatory Visit: Payer: 59

## 2023-01-19 ENCOUNTER — Ambulatory Visit: Payer: 59 | Admitting: Hematology

## 2023-02-01 ENCOUNTER — Inpatient Hospital Stay: Payer: 59 | Attending: Nurse Practitioner

## 2023-02-01 ENCOUNTER — Telehealth: Payer: Self-pay

## 2023-02-01 ENCOUNTER — Inpatient Hospital Stay: Payer: 59 | Admitting: Hematology

## 2023-02-01 NOTE — Progress Notes (Deleted)
[facility]   Telephone:([phone] [phone]   Clinic Follow up Note   Patient Care Team: Leilani Able, MD as PCP - General (Family Medicine) Malachy Mood, MD as Consulting Physician (Hematology) Almond Lint, MD as Consulting Physician (General Surgery) Lonie Peak, MD as Attending Physician (Radiation Oncology) Pollyann Samples, NP as Nurse Practitioner (Nurse Practitioner)  Date of Service:  02/01/2023  CHIEF COMPLAINT: f/u of {diagnosis, copy from prior note}  CURRENT THERAPY:  {Usually copy/paste from prior note, but pay attention to if treatment changes. Or change to Surveillance if they finished treatment}  ASSESSMENT: *** Amy Roman is a 72 y.o. female with   No problem-specific Assessment & Plan notes found for this encounter.  ***   PLAN: {Everything Dr. Mosetta Putt talks to pt about, including reviewing scans and labs. } -{proceed with ***} -{lab with/without flush and f/u when?}   SUMMARY OF ONCOLOGIC HISTORY: Oncology History Overview Note  Cancer Staging Malignant neoplasm of 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, estrogen receptor positive (HCC) Staging form: Breast, AJCC 8th Edition - Clinical stage from 02/16/2018: Stage IB (cT2(m), cN0, cM0, G2, ER+, PR+, HER2-) - Signed by Malachy Mood, MD on 03/11/2018    Malignant neoplasm of 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, estrogen receptor positive  01/30/2018 Mammogram   Screening mammogram FINDINGS: In the right breast, possible distortion warrants further evaluation. The patient has had an excisional biopsy of her right breast upper outer quadrant in 2007, however this distortion is either better seen or new from her prior mammogram, and it does not quite match the surgical site as marked by a skin scar marker. In the left breast, no findings suspicious for malignancy.   02/14/2018 Mammogram   Diagnostic mammogram: In the lateral aspect of the right breast,  posterior depth there is a prominent area of distortion at the approximate 9 o'clock location. While this is in the region of the excisional biopsy that the patient had in 2007, the distortion has become significantly more prominent as compared to the tomosynthesis mammogram performed in September of 2016, and appears to be inferior to the surgical scar.   02/14/2018 Breast US   Ultrasound of the right breast at 8:30, 4 cm from the nipple demonstrates an irregular hypoechoic vascular mass with spiculated margins measuring 2.1 x 2.0 x 1.8 cm. There is a small adjacent mass at 9 o'clock, 4 cm from the nipple, which is 1.2 cm away from the dominant mass. The smaller mass measures 1.1 x 0.7 x 0.7 cm. Together, the 2 masses span 3.6 cm of tissue. Ultrasound of the right axilla demonstrates multiple normal-appearing lymph nodes.   IMPRESSION: 1. There is a highly suspicious mass in the right breast at 830 with a small possible satellite lesion at 9 o'clock. 2.  No evidence of right axillary lymphadenopathy.   02/16/2018 Cancer Staging   Staging form: Breast, AJCC 8th Edition - Clinical stage from 02/16/2018: Stage IB (cT2(m), cN0, cM0, G2, ER+, PR+, HER2-) - Signed by Malachy Mood, MD on 03/11/2018   02/16/2018 Initial Biopsy   Diagnosis 1. Breast, right, needle core biopsy, 8:30 o'clock - INVASIVE DUCTAL CARCINOMA, SEE COMMENT. 2. Breast, right, needle core biopsy, 9 o'clock - INVASIVE DUCTAL CARCINOMA. - LOBULAR NEOPLASIA (ATYPICAL LOBULAR HYPERPLASIA).  ER: 95%, positive, strong staining PR: 80%, positive, moderate staining  Proliferation Marker Ki67: 2%  HER2 - Negative Ratio of HER2/CEP17 signals: 1.80 Average HER2 copy number per cell: 2.97  03/01/2018 Imaging   MR Bilateral breast IMPRESSION: Two areas of known malignancy in the LATERAL portion of the RIGHT breast. No additional areas of concern in either breast. No axillary adenopathy.   03/11/2018 Initial Diagnosis   Malignant neoplasm  of 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, estrogen receptor positive (HCC)   04/05/2018 Surgery   RIGHT BREAST SEED BRACKETED LUMPECTOMY WITH SENTINEL LYMPH NODE BIOPSY by Dr. Donell Beers 04/05/18   04/05/2018 Pathology Results   Surgery  Diagnosis 04/05/18  1. Breast, lumpectomy, right - INVASIVE DUCTAL CARCINOMA GRADE I/III, TWO FOCI SPANNING 2.9 CM AND 0.9 CM. - LOBULAR NEOPLASIA (LOBULAR CARCINOMA IN SITU). - DUCTAL CARCINOMA IN SITU, LOW GRADE. - LYMPHOVASCULAR INVASION IS IDENTIFIED. - THE SURGICAL RESECTION MARGINS ARE NEGATIVE FOR DUCTAL CARCINOMA. - SEE ONCOLOGY TABLE BELOW. 2. Lymph node, sentinel, biopsy, right - THERE IS NO EVIDENCE OF CARCINOMA IN 1 OF 1 LYMPH NODE (0/1). 3. Lymph node, sentinel, biopsy, right - METASTATIC CARCINOMA IN 1 OF 1 LYMPH NODE (1/1) WITH EXTRACAPSULAR EXTENSION. - EXTRACAPSULAR EXTENSION EXTENDS 0.3 CM. 4. Lymph node, sentinel, biopsy, right - DUCTAL CARCINOMA. - PERINEURAL INVASION IS IDENTIFIED. - SEE COMMENT. 5. Lymph node, sentinel, biopsy, right 1 of 4   04/05/2018 Miscellaneous   Mammaprint Low Risk with 10% risk of recurrance with Tamoxifen alone  MPI at +0.240 DMFI at 97.8%   04/12/2018 Cancer Staging   Staging form: Breast, AJCC 8th Edition - Pathologic: Stage IA (pT2, pN1a, cM0, G1, ER+, PR+, HER2-) - Signed by Malachy Mood, MD on 04/12/2018   04/13/2018 Imaging   Whole Body Bone scan 04/13/18 IMPRESSION: No scintigraphic evidence of osseous metastatic disease.   04/16/2018 Imaging   CT CAP W Contrast 04/16/18  IMPRESSION: 1. No definite findings of metastatic disease in the chest. Solitary solid 4 mm right lower lobe pulmonary nodule, for which initial chest CT follow-up is advised in 3 months. 2. Postsurgical changes from right breast lumpectomy and right axillary node dissection. Thin-walled fluid collections in the right breast and right axilla are most compatible with postsurgical seromas. 3. No evidence of metastatic  disease in the abdomen or pelvis.    04/17/2018 Surgery   AXILLARY LYMPH NODE DISSECTION by Dr. Donell Beers 04/17/18     04/17/2018 Pathology Results   Diagnosis 04/17/18  Lymph nodes, regional resection, Right Axillary - BENIGN FIBROADIPOSE AND BREAST TISSUE WITH FAT NECROSIS. - NO LYMPHOID TISSUE PRESENT. - NO MALIGNANCY IDENTIFIED. Microscopic Comment The entire specimen was submitted.   05/28/2018 - 07/10/2018 Radiation Therapy   Radiation treatment dates:   05/28/2018-07/10/2018   Site/dose:    1. Right breast, 2 Gy in 25 fractions for a total dose of 50 Gy 2. Right SCV, 2 Gy in 25 fractions for a total dose of 50 Gy 3. Right breast boost, 2 Gy in 5 fractions for a total dose of 5 Gy           07/24/2018 -  Anti-estrogen oral therapy   Letrozole 2.5mg  daily starting 07/24/18    Survivorship   Per Santiago Glad, NP       INTERVAL HISTORY: *** Sibley A [name] Hussein is here for a follow up of {diagnosis} She was last seen by {provider} on {date} She presents to the clinic {alone/accompanied by}. {Everything the pt says, basically. How they're feeling, complaints and concerns, etc}   All other systems were reviewed with the patient and are negative.  MEDICAL HISTORY:  Past Medical History:  Diagnosis Date   Anemia  Anxiety and depression 10/15/2014   Arthritis    BILATERAL KNEES, HAD CORTISONE INJECTION 03/2017   Brain cancer (HCC)    Breast cancer (HCC) 2019   Right Breast Cancer   Depression    GERD (gastroesophageal reflux disease)    OCCASIONAL WITH CERTAIN FOOD   Headache    Heart murmur    History of radiation therapy 05/28/18- 07/10/18   Right Breast 25 fractions for a total dose of 50 Gy, Right SCV and PAB 25 fractions for a total dose of 50 Gy, right breast boost, 5 fractions for a total dose of 10 Gy.    Personal history of radiation therapy    Right Breast Cancer   Seasonal allergies    SVD (spontaneous vaginal delivery)    x 4   Unspecified hereditary and  idiopathic peripheral neuropathy 05/14/2014   LEFT KNEE AND FOOT    SURGICAL HISTORY: Past Surgical History:  Procedure Laterality Date   ABDOMINAL HYSTERECTOMY     AXILLARY LYMPH NODE DISSECTION Right 04/17/2018   Procedure: AXILLARY LYMPH NODE DISSECTION;  Surgeon: Almond Lint, MD;  Location: [facility];  Service: General;  Laterality: Right;   BREAST EXCISIONAL BIOPSY Right 2007   BREAST LUMPECTOMY Right 2019   BREAST LUMPECTOMY WITH RADIOACTIVE SEED AND SENTINEL LYMPH NODE BIOPSY Right 04/05/2018   Procedure: RIGHT BREAST SEED BRACKETED LUMPECTOMY WITH SENTINEL LYMPH NODE BIOPSY;  Surgeon: Almond Lint, MD;  Location: [facility];  Service: General;  Laterality: Right;   BREAST SURGERY     bx, no cancer    CATARACT EXTRACTION Bilateral    DILATATION & CURETTAGE/HYSTEROSCOPY WITH MYOSURE N/A 08/09/2017   Procedure: DILATATION & CURETTAGE/HYSTEROSCOPY WITH MYOSURE;  Surgeon: Allie Bossier, MD;  Location: [facility];  Service: Gynecology;  Laterality: N/A;   LABIOPLASTY  03/06/2018   Procedure: LABIAPLASTY;  Surgeon: Allie Bossier, MD;  Location: [facility];  Service: Gynecology;;   TOTAL KNEE ARTHROPLASTY Right 09/14/2020   Procedure: RIGHT TOTAL KNEE ARTHROPLASTY;  Surgeon: Tarry Kos, MD;  Location: [facility];  Service: Orthopedics;  Laterality: Right;   VAGINAL HYSTERECTOMY Bilateral 03/06/2018   Procedure: HYSTERECTOMY VAGINAL WITH BILATERAL SALPINGOOPHORECTOMY;  Surgeon: Allie Bossier, MD;  Location: [facility];  Service: Gynecology;  Laterality: Bilateral;    I have reviewed the social history and family history with the patient and they are unchanged from previous note.  ALLERGIES:  has No Known Allergies.  MEDICATIONS:  Current Outpatient Medications  Medication Sig Dispense Refill   amitriptyline (ELAVIL) 25 MG tablet TAKE 2 TABLETS(50 MG) BY MOUTH AT BEDTIME. MAY INCREASE TO 75 MG AT BEDTIME AFTER 2 WEEKS 60 tablet 3   aspirin EC 81 MG tablet Take 1 tablet  (81 mg total) by mouth 2 (two) times daily. To be taken after surgery to prevent blood clots 84 tablet 0   b complex vitamins tablet Take 1 tablet by mouth daily.     cholecalciferol (VITAMIN D3) 25 MCG (1000 UNIT) tablet Take 1,000 Units by mouth daily.     ferrous sulfate 325 (65 FE) MG EC tablet Take 325 mg by mouth 3 (three) times daily with meals.     FLUoxetine (PROZAC) 40 MG capsule Take 40 mg by mouth every morning.     gabapentin (NEURONTIN) 300 MG capsule Take 1 capsule (300 mg total) by mouth 3 (three) times daily. 90 capsule 3   hydrOXYzine (ATARAX/VISTARIL) 10 MG tablet Take 10 mg by mouth 3 (three) times daily  as needed for anxiety.      methocarbamol (ROBAXIN) 500 MG tablet Take 1 tablet (500 mg total) by mouth 2 (two) times daily as needed. To be taken after surgery as needed 20 tablet 0   omeprazole (PRILOSEC) 40 MG capsule TAKE 1 CAPSULE(40 MG) BY MOUTH TWICE DAILY FOR 10 DAYS 20 capsule 0   ondansetron (ZOFRAN) 4 MG tablet Take 1 tablet (4 mg total) by mouth every 8 (eight) hours as needed for nausea or vomiting. 40 tablet 0   oxyCODONE-acetaminophen (PERCOCET) 5-325 MG tablet Take 1-2 tablets by mouth every 6 (six) hours as needed. 40 tablet 0   promethazine (PHENERGAN) 25 MG tablet Take 1 tablet (25 mg total) by mouth every 6 (six) hours as needed for nausea. 30 tablet 1   tamoxifen (NOLVADEX) 20 MG tablet TAKE 1 TABLET(20 MG) BY MOUTH DAILY 30 tablet 3   traZODone (DESYREL) 100 MG tablet Take 100 mg by mouth at bedtime.     vitamin B-12 (CYANOCOBALAMIN) 500 MCG tablet Take 500 mcg by mouth daily.     No current facility-administered medications for this visit.    PHYSICAL EXAMINATION: ECOG PERFORMANCE STATUS: {CHL ONC ECOG [phone]}  There were no vitals filed for this visit. Wt Readings from Last 3 Encounters:  07/20/22 217 lb (98.4 kg)  07/07/22 218 lb (98.9 kg)  04/07/22 219 lb (99.3 kg)    {Only keep what was examined. If exam not performed, can use .CEXAM  } GENERAL:alert, no distress and comfortable SKIN: skin color, texture, turgor are normal, no rashes or significant lesions EYES: normal, Conjunctiva are pink and non-injected, sclera clear {OROPHARYNX:no exudate, no erythema and lips, buccal mucosa, and tongue normal}  NECK: supple, thyroid normal size, non-tender, without nodularity LYMPH:  no palpable lymphadenopathy in the cervical, axillary {or inguinal} LUNGS: clear to auscultation and percussion with normal breathing effort HEART: regular rate & rhythm and no murmurs and no lower extremity edema ABDOMEN:abdomen soft, non-tender and normal bowel sounds Musculoskeletal:no cyanosis of digits and no clubbing  NEURO: alert & oriented x 3 with fluent speech, no focal motor/sensory deficits  LABORATORY DATA:  I have reviewed the data as listed    Latest Ref Rng & Units 07/20/2022    9:24 AM 04/07/2022    3:01 PM 12/07/2021    9:49 AM  CBC  WBC 4.0 - 10.5 K/uL 4.4  8.3  4.8   Hemoglobin 12.0 - 15.0 g/dL 13.0  86.5  78.4   Hematocrit 36.0 - 46.0 % 38.3  41.8  37.7   Platelets 150 - 400 K/uL 197  243  254         Latest Ref Rng & Units 07/20/2022    9:24 AM 04/07/2022    3:01 PM 12/07/2021   10:56 AM  CMP  Glucose 70 - 99 mg/dL 91  696  89   BUN 8 - 23 mg/dL 25  22  27    Creatinine 0.44 - 1.00 mg/dL 2.95  2.84  1.32   Sodium 135 - 145 mmol/L 141  141  137   Potassium 3.5 - 5.1 mmol/L 4.6  4.8  5.3   Chloride 98 - 111 mmol/L 105  111  103   CO2 22 - 32 mmol/L 31  25  21    Calcium 8.9 - 10.3 mg/dL 9.3  9.5  9.6   Total Protein 6.5 - 8.1 g/dL 6.9  7.8  6.4   Total Bilirubin 0.3 - 1.2 mg/dL 0.4  0.6  0.2   Alkaline Phos 38 - 126 U/L 73  96  123   AST 15 - 41 U/L 19  31  25    ALT 0 - 44 U/L 13  18  17        RADIOGRAPHIC STUDIES: I have personally reviewed the radiological images as listed and agreed with the findings in the report. No results found.    No orders of the defined types were placed in this encounter.  All  questions were answered. The patient knows to call the clinic with any problems, questions or concerns. No barriers to learning was detected. The total time spent in the appointment was {CHL ONC TIME VISIT - NFAOZ:[id number]}[phone]}.     Salome HolmesLaChelle N Jaidin Ugarte, CMA 02/01/2023   I, Monica MartinezLaChelle Kearston Putman, CMA, am acting as scribe for Malachy MoodYan Feng, MD.   {Add scribe attestation statement}

## 2023-02-01 NOTE — Telephone Encounter (Signed)
I call the pt and and no answer so I left a voicemail. I left a message for the pt making her aware of her appointment today and that if she has any question to give the office a call at [phone].

## 2023-02-01 NOTE — Assessment & Plan Note (Deleted)
invasive ductal carcinoma, multi-focal, stage IA (pmT2N1aM0), ER+/PR+/HER2-, G1  -diagnosed 01/2018, s/p right lumpectomy and ALND, and adjuvant RT. oncotype showed low risk, adjuvant chemo was not recommended.  -began adjuvant AI with letrozole in 07/2018, tolerating well with stable joint pain.  She switched to tamoxifen 11/2021 due to osteoporosis.  Goal of therapy is a total of 5-10 years -Amy Roman is clinically doing well.  Tolerating tamoxifen.  Breast exam is benign, labs are stable.  I encouraged her to hydrate.  No clinical concern for breast cancer recurrence.  Mammogram is scheduled for later today, will follow  -She is 5 years out from definitive treatment, the recurrence risk has decreased.  Continue surveillance and tamoxifen -Follow-up in 6 months, or sooner if needed

## 2023-02-02 ENCOUNTER — Encounter: Payer: Self-pay | Admitting: Nurse Practitioner

## 2023-02-14 ENCOUNTER — Telehealth: Payer: Self-pay | Admitting: Hematology

## 2023-02-14 NOTE — Telephone Encounter (Signed)
Contacted patient to scheduled appointments. Left message for patient to call back to schedule missed appointments.   

## 2023-03-15 ENCOUNTER — Ambulatory Visit: Payer: 59 | Admitting: Orthopaedic Surgery

## 2023-03-29 ENCOUNTER — Ambulatory Visit: Payer: 59 | Admitting: Orthopaedic Surgery

## 2023-04-04 ENCOUNTER — Ambulatory Visit: Payer: 59 | Admitting: Orthopaedic Surgery

## 2023-04-08 ENCOUNTER — Other Ambulatory Visit: Payer: Self-pay | Admitting: Nurse Practitioner

## 2023-04-11 ENCOUNTER — Telehealth: Payer: Self-pay | Admitting: Hematology

## 2023-04-11 NOTE — Telephone Encounter (Signed)
Contacted patient to scheduled appointments. Left message with appointment details and a call back number if patient had any questions or could not accommodate the time we provided.   

## 2023-04-13 ENCOUNTER — Other Ambulatory Visit: Payer: Self-pay

## 2023-05-18 ENCOUNTER — Inpatient Hospital Stay: Payer: 59 | Attending: Nurse Practitioner

## 2023-05-18 ENCOUNTER — Inpatient Hospital Stay: Payer: 59 | Admitting: Hematology

## 2023-05-18 NOTE — Assessment & Plan Note (Signed)
-  she developed mild anemia Hg 10.3 in 02/2018, worsened to 9.2 in 06/2018 with work up showing iron 22, TIBC 505, 4% sat, and ferritin 9, normal B12, MMA, and folate. consistent with IDA -She did have B12 196 which is low range/normal about 6 years ago. On oral B12 500 mcg daily  -colonoscopy 09/2018 (Nandigam) showed diverticulosis and non bleeding internal hemorrhoids. She source of her IDA has not been confirmed.  -she did not tolerate oral iron and declined RBC transfusion -she received IV Feraheme in the past, responded well.  -anemia resolved

## 2023-05-18 NOTE — Assessment & Plan Note (Signed)
invasive ductal carcinoma, multi-focal, stage IA (pmT2N1aM0), ER+/PR+/HER2-, G1  -diagnosed 01/2018, s/p right lumpectomy and ALND, and adjuvant RT. oncotype showed low risk, adjuvant chemo was not recommended.  -began adjuvant AI with letrozole in 07/2018, tolerating well with stable joint pain.  She switched to tamoxifen 11/2021 due to osteoporosis.  Goal of therapy is a total of 5-10 years -Ms. [name] Rene Paci is clinically doing well.  Tolerating tamoxifen.  Breast exam is benign, labs are stable.  I encouraged her to hydrate.  No clinical concern for breast cancer recurrence.  Mammogram is scheduled for later today, will follow  -She is 5 years out from initial diagnosis, the recurrence risk has decreased.  Continue surveillance and tamoxifen -Follow-up in one year with NP

## 2023-05-24 ENCOUNTER — Encounter: Payer: Self-pay | Admitting: Nurse Practitioner

## 2023-05-24 ENCOUNTER — Other Ambulatory Visit: Payer: Self-pay | Admitting: Hematology

## 2023-05-24 NOTE — Progress Notes (Signed)
This encounter was created in error - please disregard.

## 2023-06-27 ENCOUNTER — Ambulatory Visit (INDEPENDENT_AMBULATORY_CARE_PROVIDER_SITE_OTHER): Payer: 59 | Admitting: Orthopaedic Surgery

## 2023-06-27 ENCOUNTER — Encounter: Payer: Self-pay | Admitting: Orthopaedic Surgery

## 2023-06-27 VITALS — Ht 60.0 in | Wt 212.0 lb

## 2023-06-27 DIAGNOSIS — Z6841 Body Mass Index (BMI) 40.0 and over, adult: Secondary | ICD-10-CM

## 2023-06-27 DIAGNOSIS — M1712 Unilateral primary osteoarthritis, left knee: Secondary | ICD-10-CM

## 2023-06-27 NOTE — Progress Notes (Signed)
Office Visit Note   Patient: Amy Roman           Date of Birth: September 14, 1951           MRN: [id number] Visit Date: 06/27/2023              Requested by: Leilani Able, MD [address] [place],  [place] [address] PCP: Leilani Able, MD   Assessment & Plan: Visit Diagnoses:  1. Primary osteoarthritis of left knee   2. Body mass index 40.0-44.9, adult [facility])     Plan: Patient is a 72 year old female with end-stage left knee DJD.  Unfortunately she has not achieved a BMI of less than 40.  Her goal weight is 200 pounds.  At this point we will make referral to weight loss clinic to help her.  She will follow-up as needed.  Follow-Up Instructions: No follow-ups on file.   Orders:  No orders of the defined types were placed in this encounter.  No orders of the defined types were placed in this encounter.     Procedures: No procedures performed   Clinical Data: No additional findings.   Subjective: Chief Complaint  Patient presents with   Left Knee - Pain    HPI Patient returns today for follow-up of her left knee pain and recheck of her weight. Review of Systems  Constitutional: Negative.   HENT: Negative.    Eyes: Negative.   Respiratory: Negative.    Cardiovascular: Negative.   Endocrine: Negative.   Musculoskeletal: Negative.   Neurological: Negative.   Hematological: Negative.   Psychiatric/Behavioral: Negative.    All other systems reviewed and are negative.    Objective: Vital Signs: Ht 5' (1.524 m)   Wt 212 lb (96.2 kg)   BMI 41.40 kg/m   Physical Exam Vitals and nursing note reviewed.  Constitutional:      Appearance: She is well-developed.  HENT:     Head: Normocephalic and atraumatic.  Pulmonary:     Effort: Pulmonary effort is normal.  Abdominal:     Palpations: Abdomen is soft.  Musculoskeletal:     Cervical back: Neck supple.  Skin:    General: Skin is warm.     Capillary Refill: Capillary refill takes less than 2 seconds.   Neurological:     Mental Status: She is alert and oriented to person, place, and time.  Psychiatric:        Behavior: Behavior normal.        Thought Content: Thought content normal.        Judgment: Judgment normal.     Ortho Exam Examination left knee is unchanged. Specialty Comments:  No specialty comments available.  Imaging: No results found.   PMFS History: Patient Active Problem List   Diagnosis Date Noted   Primary osteoarthritis of left knee 04/21/2021   Status post total knee replacement, right 09/14/2020   Primary osteoarthritis of right knee 09/13/2020   Iron deficiency anemia 07/23/2018   Breast cancer metastasized to axillary lymph node, right (HCC) 04/17/2018   Malignant neoplasm of 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, estrogen receptor positive (HCC) 03/11/2018   Post-operative state 03/06/2018   Uterine hyperplasia 10/11/2017   Anxiety and depression 10/15/2014   Vitamin B 12 deficiency 06/02/2014   Vitamin D deficiency 06/02/2014   Hereditary and idiopathic peripheral neuropathy 05/14/2014   Past Medical History:  Diagnosis Date   Anemia    Anxiety and depression 10/15/2014   Arthritis  BILATERAL KNEES, HAD CORTISONE INJECTION 03/2017   Brain cancer (HCC)    Breast cancer (HCC) 2019   Right Breast Cancer   Depression    GERD (gastroesophageal reflux disease)    OCCASIONAL WITH CERTAIN FOOD   Headache    Heart murmur    History of radiation therapy 05/28/18- 07/10/18   Right Breast 25 fractions for a total dose of 50 Gy, Right SCV and PAB 25 fractions for a total dose of 50 Gy, right breast boost, 5 fractions for a total dose of 10 Gy.    Personal history of radiation therapy    Right Breast Cancer   Seasonal allergies    SVD (spontaneous vaginal delivery)    x 4   Unspecified hereditary and idiopathic peripheral neuropathy 05/14/2014   LEFT KNEE AND FOOT    Family History  Problem Relation Age of Onset   Diabetes Mother     Neuropathy Mother    Other Father    Neuropathy Sister    Cancer Sister        unknown type cancer in neck    Cancer Maternal Aunt        leukemia    Colon cancer Neg Hx    Breast cancer Neg Hx     Past Surgical History:  Procedure Laterality Date   ABDOMINAL HYSTERECTOMY     AXILLARY LYMPH NODE DISSECTION Right 04/17/2018   Procedure: AXILLARY LYMPH NODE DISSECTION;  Surgeon: Almond Lint, MD;  Location: [facility];  Service: General;  Laterality: Right;   BREAST EXCISIONAL BIOPSY Right 2007   BREAST LUMPECTOMY Right 2019   BREAST LUMPECTOMY WITH RADIOACTIVE SEED AND SENTINEL LYMPH NODE BIOPSY Right 04/05/2018   Procedure: RIGHT BREAST SEED BRACKETED LUMPECTOMY WITH SENTINEL LYMPH NODE BIOPSY;  Surgeon: Almond Lint, MD;  Location: [facility];  Service: General;  Laterality: Right;   BREAST SURGERY     bx, no cancer    CATARACT EXTRACTION Bilateral    DILATATION & CURETTAGE/HYSTEROSCOPY WITH MYOSURE N/A 08/09/2017   Procedure: DILATATION & CURETTAGE/HYSTEROSCOPY WITH MYOSURE;  Surgeon: Allie Bossier, MD;  Location: [facility];  Service: Gynecology;  Laterality: N/A;   LABIOPLASTY  03/06/2018   Procedure: LABIAPLASTY;  Surgeon: Allie Bossier, MD;  Location: [facility];  Service: Gynecology;;   TOTAL KNEE ARTHROPLASTY Right 09/14/2020   Procedure: RIGHT TOTAL KNEE ARTHROPLASTY;  Surgeon: Tarry Kos, MD;  Location: [facility];  Service: Orthopedics;  Laterality: Right;   VAGINAL HYSTERECTOMY Bilateral 03/06/2018   Procedure: HYSTERECTOMY VAGINAL WITH BILATERAL SALPINGOOPHORECTOMY;  Surgeon: Allie Bossier, MD;  Location: [facility];  Service: Gynecology;  Laterality: Bilateral;   Social History   Occupational History   Occupation: house   Tobacco Use   Smoking status: Never   Smokeless tobacco: Never  Vaping Use   Vaping status: Never Used  Substance and Sexual Activity   Alcohol use: No   Drug use: No   Sexual activity: Not on file

## 2023-06-27 NOTE — Addendum Note (Signed)
Addended by: Wendi Maya on: 06/27/2023 03:46 PM   Modules accepted: Orders

## 2023-07-07 ENCOUNTER — Telehealth: Payer: Self-pay | Admitting: Hematology

## 2023-07-07 ENCOUNTER — Other Ambulatory Visit: Payer: Self-pay

## 2023-07-07 MED ORDER — TAMOXIFEN CITRATE 20 MG PO TABS: 20.0000 mg | ORAL_TABLET | Freq: Every day | ORAL | 3 refills | Status: DC

## 2023-07-24 ENCOUNTER — Other Ambulatory Visit: Payer: Self-pay | Admitting: Family Medicine

## 2023-07-24 DIAGNOSIS — Z1231 Encounter for screening mammogram for malignant neoplasm of breast: Secondary | ICD-10-CM

## 2023-07-25 ENCOUNTER — Encounter: Payer: Self-pay | Admitting: Nurse Practitioner

## 2023-07-26 ENCOUNTER — Inpatient Hospital Stay (HOSPITAL_BASED_OUTPATIENT_CLINIC_OR_DEPARTMENT_OTHER): Payer: 59 | Admitting: Hematology

## 2023-07-26 ENCOUNTER — Inpatient Hospital Stay: Payer: 59 | Attending: Nurse Practitioner

## 2023-07-26 VITALS — BP 151/73 | HR 66 | Temp 98.1°F | Resp 16 | Ht 60.0 in | Wt 211.4 lb

## 2023-07-26 DIAGNOSIS — G629 Polyneuropathy, unspecified: Secondary | ICD-10-CM | POA: Diagnosis not present

## 2023-07-26 DIAGNOSIS — C50511 Malignant neoplasm of lower-outer quadrant of right female breast: Secondary | ICD-10-CM | POA: Diagnosis present

## 2023-07-26 DIAGNOSIS — E538 Deficiency of other specified B group vitamins: Secondary | ICD-10-CM

## 2023-07-26 DIAGNOSIS — F32A Depression, unspecified: Secondary | ICD-10-CM | POA: Diagnosis not present

## 2023-07-26 DIAGNOSIS — Z7982 Long term (current) use of aspirin: Secondary | ICD-10-CM | POA: Diagnosis not present

## 2023-07-26 DIAGNOSIS — Z90710 Acquired absence of both cervix and uterus: Secondary | ICD-10-CM | POA: Diagnosis not present

## 2023-07-26 DIAGNOSIS — Z79899 Other long term (current) drug therapy: Secondary | ICD-10-CM | POA: Diagnosis not present

## 2023-07-26 DIAGNOSIS — F419 Anxiety disorder, unspecified: Secondary | ICD-10-CM | POA: Diagnosis not present

## 2023-07-26 DIAGNOSIS — Z923 Personal history of irradiation: Secondary | ICD-10-CM | POA: Diagnosis not present

## 2023-07-26 DIAGNOSIS — Z170 Estrogen receptor positive status [ER+]: Secondary | ICD-10-CM

## 2023-07-26 DIAGNOSIS — M810 Age-related osteoporosis without current pathological fracture: Secondary | ICD-10-CM | POA: Diagnosis not present

## 2023-07-26 DIAGNOSIS — Z79810 Long term (current) use of selective estrogen receptor modulators (SERMs): Secondary | ICD-10-CM | POA: Insufficient documentation

## 2023-07-26 DIAGNOSIS — Z90722 Acquired absence of ovaries, bilateral: Secondary | ICD-10-CM | POA: Insufficient documentation

## 2023-07-26 DIAGNOSIS — D509 Iron deficiency anemia, unspecified: Secondary | ICD-10-CM

## 2023-07-26 LAB — COMPREHENSIVE METABOLIC PANEL
ALT: 13 U/L (ref 0–44)
AST: 22 U/L (ref 15–41)
Albumin: 4.2 g/dL (ref 3.5–5.0)
Alkaline Phosphatase: 77 U/L (ref 38–126)
Anion gap: 7 (ref 5–15)
BUN: 21 mg/dL (ref 8–23)
CO2: 28 mmol/L (ref 22–32)
Calcium: 10.1 mg/dL (ref 8.9–10.3)
Chloride: 103 mmol/L (ref 98–111)
Creatinine, Ser: 1.09 mg/dL — ABNORMAL HIGH (ref 0.44–1.00)
GFR, Estimated: 54 mL/min — ABNORMAL LOW (ref >60)
Glucose, Bld: 93 mg/dL (ref 70–99)
Potassium: 4.6 mmol/L (ref 3.5–5.1)
Sodium: 138 mmol/L (ref 135–145)
Total Bilirubin: 0.3 mg/dL (ref 0.3–1.2)
Total Protein: 7.3 g/dL (ref 6.5–8.1)

## 2023-07-26 LAB — VITAMIN B12: Vitamin B-12: 819 pg/mL (ref 180–914)

## 2023-07-26 NOTE — Progress Notes (Signed)
[facility]   [phone] [phone]   Clinic Follow up Note   Patient Care Team: Leilani Able, MD as PCP - General (Family Medicine) Malachy Mood, MD as Consulting Physician (Hematology) Almond Lint, MD as Consulting Physician (General Surgery) Lonie Peak, MD as Attending Physician (Radiation Oncology) Pollyann Samples, NP as Nurse Practitioner (Nurse Practitioner)  Date of Service:  07/26/2023  CHIEF COMPLAINT: f/u of breast cancer  CURRENT THERAPY:  Tamoxifen 20 mg daily  Oncology History   Malignant neoplasm of 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, estrogen receptor positive (HCC) invasive ductal carcinoma, multi-focal, stage IA (pmT2N1aM0), ER+/PR+/HER2-, G1  -diagnosed 01/2018, s/p right lumpectomy and ALND, and adjuvant RT. oncotype showed low risk, adjuvant chemo was not recommended.  -began adjuvant AI with letrozole in 07/2018, tolerating well with stable joint pain.  She switched to tamoxifen 11/2021 due to osteoporosis.  Goal of therapy is a total of 5-10 years -Amy Roman is clinically doing well.  Tolerating tamoxifen.  Breast exam is benign, labs are stable.  I encouraged her to hydrate.  No clinical concern for breast cancer recurrence.  Mammogram is scheduled for later today, will follow  -She is 5 years out from initial diagnosis, the recurrence risk has decreased.  Continue surveillance and tamoxifen -Follow-up anually    Assessment and Plan    Breast Cancer On tamoxifen since February 2023 with tolerable hot flashes. No new concerns or symptoms. Physical exam revealed scar tissue and retraction in the right breast from previous surgery, but no new abnormalities. -Continue tamoxifen for another 2-3 years. -Schedule annual follow-up visits for the next 5 years. -Perform mammogram on August 18, 2023.  Lower Back Pain Intermittent, managed with calcium supplementation. No other pain management  needed. -Continue current management.  Neuropathy Experiencing numbness and pain in hands, particularly at night and in the morning. Managed with amitriptyline and gabapentin. -Continue current management.  Osteoporosis Taking calcium and vitamin D3 supplements. -Not on biphosphonate's due to dental issue -tamoxifen may strengthen her bone  Depression Managed with daily fluoxetine. -Continue current management.  Decreased Kidney Function Patient advised to increase water intake. -Monitor kidney function.  Dental Issues Patient experiencing discomfort with partial dentures and has difficulty eating. -Advise patient to see a dentist.  General Health Maintenance -Order blood tests to check kidney and liver function and blood cell counts. -Schedule next appointment in one year.     Plan -Continue tamoxifen -Lab and follow-up with NP in 1 year    SUMMARY OF ONCOLOGIC HISTORY: Oncology History Overview Note  Cancer Staging Malignant neoplasm of 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, estrogen receptor positive (HCC) Staging form: Breast, AJCC 8th Edition - Clinical stage from 02/16/2018: Stage IB (cT2(m), cN0, cM0, G2, ER+, PR+, HER2-) - Signed by Malachy Mood, MD on 03/11/2018    Malignant neoplasm of 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, estrogen receptor positive (HCC)  01/30/2018 Mammogram   Screening mammogram FINDINGS: In the right breast, possible distortion warrants further evaluation. The patient has had an excisional biopsy of her right breast upper outer quadrant in 2007, however this distortion is either better seen or new from her prior mammogram, and it does not quite match the surgical site as marked by a skin scar marker. In the left breast, no findings suspicious for malignancy.   02/14/2018 Mammogram   Diagnostic mammogram: In the lateral aspect of the right breast, posterior depth there is a prominent area of distortion at  the approximate 9  o'clock location. While this is in the region of the excisional biopsy that the patient had in 2007, the distortion has become significantly more prominent as compared to the tomosynthesis mammogram performed in September of 2016, and appears to be inferior to the surgical scar.   02/14/2018 Breast US   Ultrasound of the right breast at 8:30, 4 cm from the nipple demonstrates an irregular hypoechoic vascular mass with spiculated margins measuring 2.1 x 2.0 x 1.8 cm. There is a small adjacent mass at 9 o'clock, 4 cm from the nipple, which is 1.2 cm away from the dominant mass. The smaller mass measures 1.1 x 0.7 x 0.7 cm. Together, the 2 masses span 3.6 cm of tissue. Ultrasound of the right axilla demonstrates multiple normal-appearing lymph nodes.   IMPRESSION: 1. There is a highly suspicious mass in the right breast at 830 with a small possible satellite lesion at 9 o'clock. 2.  No evidence of right axillary lymphadenopathy.   02/16/2018 Cancer Staging   Staging form: Breast, AJCC 8th Edition - Clinical stage from 02/16/2018: Stage IB (cT2(m), cN0, cM0, G2, ER+, PR+, HER2-) - Signed by Malachy Mood, MD on 03/11/2018   02/16/2018 Initial Biopsy   Diagnosis 1. Breast, right, needle core biopsy, 8:30 o'clock - INVASIVE DUCTAL CARCINOMA, SEE COMMENT. 2. Breast, right, needle core biopsy, 9 o'clock - INVASIVE DUCTAL CARCINOMA. - LOBULAR NEOPLASIA (ATYPICAL LOBULAR HYPERPLASIA).  ER: 95%, positive, strong staining PR: 80%, positive, moderate staining  Proliferation Marker Ki67: 2%  HER2 - Negative Ratio of HER2/CEP17 signals: 1.80 Average HER2 copy number per cell: 2.97    03/01/2018 Imaging   MR Bilateral breast IMPRESSION: Two areas of known malignancy in the LATERAL portion of the RIGHT breast. No additional areas of concern in either breast. No axillary adenopathy.   03/11/2018 Initial Diagnosis   Malignant neoplasm of 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, estrogen receptor  positive (HCC)   04/05/2018 Surgery   RIGHT BREAST SEED BRACKETED LUMPECTOMY WITH SENTINEL LYMPH NODE BIOPSY by Dr. Donell Beers 04/05/18   04/05/2018 Pathology Results   Surgery  Diagnosis 04/05/18  1. Breast, lumpectomy, right - INVASIVE DUCTAL CARCINOMA GRADE I/III, TWO FOCI SPANNING 2.9 CM AND 0.9 CM. - LOBULAR NEOPLASIA (LOBULAR CARCINOMA IN SITU). - DUCTAL CARCINOMA IN SITU, LOW GRADE. - LYMPHOVASCULAR INVASION IS IDENTIFIED. - THE SURGICAL RESECTION MARGINS ARE NEGATIVE FOR DUCTAL CARCINOMA. - SEE ONCOLOGY TABLE BELOW. 2. Lymph node, sentinel, biopsy, right - THERE IS NO EVIDENCE OF CARCINOMA IN 1 OF 1 LYMPH NODE (0/1). 3. Lymph node, sentinel, biopsy, right - METASTATIC CARCINOMA IN 1 OF 1 LYMPH NODE (1/1) WITH EXTRACAPSULAR EXTENSION. - EXTRACAPSULAR EXTENSION EXTENDS 0.3 CM. 4. Lymph node, sentinel, biopsy, right - DUCTAL CARCINOMA. - PERINEURAL INVASION IS IDENTIFIED. - SEE COMMENT. 5. Lymph node, sentinel, biopsy, right 1 of 4   04/05/2018 Miscellaneous   Mammaprint Low Risk with 10% risk of recurrance with Tamoxifen alone  MPI at +0.240 DMFI at 97.8%   04/12/2018 Cancer Staging   Staging form: Breast, AJCC 8th Edition - Pathologic: Stage IA (pT2, pN1a, cM0, G1, ER+, PR+, HER2-) - Signed by Malachy Mood, MD on 04/12/2018   04/13/2018 Imaging   Whole Body Bone scan 04/13/18 IMPRESSION: No scintigraphic evidence of osseous metastatic disease.   04/16/2018 Imaging   CT CAP W Contrast 04/16/18  IMPRESSION: 1. No definite findings of metastatic disease in the chest. Solitary solid 4 mm right lower lobe pulmonary nodule, for which initial chest CT  follow-up is advised in 3 months. 2. Postsurgical changes from right breast lumpectomy and right axillary node dissection. Thin-walled fluid collections in the right breast and right axilla are most compatible with postsurgical seromas. 3. No evidence of metastatic disease in the abdomen or pelvis.    04/17/2018 Surgery   AXILLARY  LYMPH NODE DISSECTION by Dr. Donell Beers 04/17/18     04/17/2018 Pathology Results   Diagnosis 04/17/18  Lymph nodes, regional resection, Right Axillary - BENIGN FIBROADIPOSE AND BREAST TISSUE WITH FAT NECROSIS. - NO LYMPHOID TISSUE PRESENT. - NO MALIGNANCY IDENTIFIED. Microscopic Comment The entire specimen was submitted.   05/28/2018 - 07/10/2018 Radiation Therapy   Radiation treatment dates:   05/28/2018-07/10/2018   Site/dose:    1. Right breast, 2 Gy in 25 fractions for a total dose of 50 Gy 2. Right SCV, 2 Gy in 25 fractions for a total dose of 50 Gy 3. Right breast boost, 2 Gy in 5 fractions for a total dose of 5 Gy           07/24/2018 -  Anti-estrogen oral therapy   Letrozole 2.5mg  daily starting 07/24/18    Survivorship   Per Santiago Glad, NP       Discussed the use of AI scribe software for clinical note transcription with the patient, who gave verbal consent to proceed.  History of Present Illness   The patient, a 72 year old female with a history of breast cancer, presents for a routine follow-up. She reports intermittent lower back pain, which she manages with calcium supplements. She denies the need for pain medication such as Tylenol.  The patient also experiences hot flashes, which she describes as tolerable and transient. She is currently on tamoxifen, which she has been taking for five years.  She also reports neuropathy, characterized by numbness and pain in both hands, particularly at night and in the morning. The symptoms are managed with nightly amitriptyline and gabapentin.  The patient also mentions dental issues, including pain in her gums and difficulty eating due to a partial denture. She expresses a desire to see a dentist for possible denture adjustment or replacement.  She is also on fluoxetine for depression, which she takes daily.  She has been taking calcium and vitamin D supplements for osteoporosis.         All other systems were reviewed with the  patient and are negative.  MEDICAL HISTORY:  Past Medical History:  Diagnosis Date   Anemia    Anxiety and depression 10/15/2014   Arthritis    BILATERAL KNEES, HAD CORTISONE INJECTION 03/2017   Brain cancer (HCC)    Breast cancer (HCC) 2019   Right Breast Cancer   Depression    GERD (gastroesophageal reflux disease)    OCCASIONAL WITH CERTAIN FOOD   Headache    Heart murmur    History of radiation therapy 05/28/18- 07/10/18   Right Breast 25 fractions for a total dose of 50 Gy, Right SCV and PAB 25 fractions for a total dose of 50 Gy, right breast boost, 5 fractions for a total dose of 10 Gy.    Personal history of radiation therapy    Right Breast Cancer   Seasonal allergies    SVD (spontaneous vaginal delivery)    x 4   Unspecified hereditary and idiopathic peripheral neuropathy 05/14/2014   LEFT KNEE AND FOOT    SURGICAL HISTORY: Past Surgical History:  Procedure Laterality Date   ABDOMINAL HYSTERECTOMY     AXILLARY LYMPH NODE DISSECTION Right  04/17/2018   Procedure: AXILLARY LYMPH NODE DISSECTION;  Surgeon: Almond Lint, MD;  Location: [facility];  Service: General;  Laterality: Right;   BREAST EXCISIONAL BIOPSY Right 2007   BREAST LUMPECTOMY Right 2019   BREAST LUMPECTOMY WITH RADIOACTIVE SEED AND SENTINEL LYMPH NODE BIOPSY Right 04/05/2018   Procedure: RIGHT BREAST SEED BRACKETED LUMPECTOMY WITH SENTINEL LYMPH NODE BIOPSY;  Surgeon: Almond Lint, MD;  Location:  SURGERY CENTER;  Service: General;  Laterality: Right;   BREAST SURGERY     bx, no cancer    CATARACT EXTRACTION Bilateral    DILATATION & CURETTAGE/HYSTEROSCOPY WITH MYOSURE N/A 08/09/2017   Procedure: DILATATION & CURETTAGE/HYSTEROSCOPY WITH MYOSURE;  Surgeon: Allie Bossier, MD;  Location: [facility];  Service: Gynecology;  Laterality: N/A;   LABIOPLASTY  03/06/2018   Procedure: LABIAPLASTY;  Surgeon: Allie Bossier, MD;  Location: [facility];  Service: Gynecology;;   TOTAL KNEE ARTHROPLASTY Right  09/14/2020   Procedure: RIGHT TOTAL KNEE ARTHROPLASTY;  Surgeon: Tarry Kos, MD;  Location: [facility];  Service: Orthopedics;  Laterality: Right;   VAGINAL HYSTERECTOMY Bilateral 03/06/2018   Procedure: HYSTERECTOMY VAGINAL WITH BILATERAL SALPINGOOPHORECTOMY;  Surgeon: Allie Bossier, MD;  Location: [facility];  Service: Gynecology;  Laterality: Bilateral;    I have reviewed the social history and family history with the patient and they are unchanged from previous note.  ALLERGIES:  has No Known Allergies.  MEDICATIONS:  Current Outpatient Medications  Medication Sig Dispense Refill   amitriptyline (ELAVIL) 25 MG tablet TAKE 2 TABLETS(50 MG) BY MOUTH AT BEDTIME. MAY INCREASE TO 75 MG AT BEDTIME AFTER 2 WEEKS 60 tablet 3   aspirin EC 81 MG tablet Take 1 tablet (81 mg total) by mouth 2 (two) times daily. To be taken after surgery to prevent blood clots 84 tablet 0   b complex vitamins tablet Take 1 tablet by mouth daily.     cholecalciferol (VITAMIN D3) 25 MCG (1000 UNIT) tablet Take 1,000 Units by mouth daily.     ferrous sulfate 325 (65 FE) MG EC tablet Take 325 mg by mouth 3 (three) times daily with meals.     FLUoxetine (PROZAC) 40 MG capsule Take 40 mg by mouth every morning.     gabapentin (NEURONTIN) 300 MG capsule Take 1 capsule (300 mg total) by mouth 3 (three) times daily. 90 capsule 3   hydrOXYzine (ATARAX/VISTARIL) 10 MG tablet Take 10 mg by mouth 3 (three) times daily as needed for anxiety.      methocarbamol (ROBAXIN) 500 MG tablet Take 1 tablet (500 mg total) by mouth 2 (two) times daily as needed. To be taken after surgery as needed 20 tablet 0   omeprazole (PRILOSEC) 40 MG capsule TAKE 1 CAPSULE(40 MG) BY MOUTH TWICE DAILY FOR 10 DAYS 20 capsule 0   ondansetron (ZOFRAN) 4 MG tablet Take 1 tablet (4 mg total) by mouth every 8 (eight) hours as needed for nausea or vomiting. 40 tablet 0   promethazine (PHENERGAN) 25 MG tablet Take 1 tablet (25 mg total) by mouth every 6 (six) hours as  needed for nausea. 30 tablet 1   tamoxifen (NOLVADEX) 20 MG tablet Take 1 tablet (20 mg total) by mouth daily. 30 tablet 3   traZODone (DESYREL) 100 MG tablet Take 100 mg by mouth at bedtime.     vitamin B-12 (CYANOCOBALAMIN) 500 MCG tablet Take 500 mcg by mouth daily.     No current facility-administered medications for this visit.  PHYSICAL EXAMINATION: ECOG PERFORMANCE STATUS: 1 - Symptomatic but completely ambulatory  Vitals:   07/26/23 1520  BP: (!) 151/73  Pulse: 66  Resp: 16  Temp: 98.1 F (36.7 C)  SpO2: 98%   Wt Readings from Last 3 Encounters:  07/26/23 211 lb 6.4 oz (95.9 kg)  06/27/23 212 lb (96.2 kg)  07/20/22 217 lb (98.4 kg)     GENERAL:alert, no distress and comfortable SKIN: skin color, texture, turgor are normal, no rashes or significant lesions EYES: normal, Conjunctiva are pink and non-injected, sclera clear NECK: supple, thyroid normal size, non-tender, without nodularity LYMPH:  no palpable lymphadenopathy in the cervical, axillary  LUNGS: clear to auscultation and percussion with normal breathing effort HEART: regular rate & rhythm and no murmurs and no lower extremity edema ABDOMEN:abdomen soft, non-tender and normal bowel sounds Musculoskeletal:no cyanosis of digits and no clubbing  NEURO: alert & oriented x 3 with fluent speech, no focal motor/sensory deficits BREAST: Right breast smaller post-surgical changes, scar tissue present, skin retraction noted. Left breast normal.  No palpable adenopathy     LABORATORY DATA:  I have reviewed the data as listed    Latest Ref Rng & Units 07/20/2022    9:24 AM 04/07/2022    3:01 PM 12/07/2021    9:49 AM  CBC  WBC 4.0 - 10.5 K/uL 4.4  8.3  4.8   Hemoglobin 12.0 - 15.0 g/dL 11.9  14.7  82.9   Hematocrit 36.0 - 46.0 % 38.3  41.8  37.7   Platelets 150 - 400 K/uL 197  243  254         Latest Ref Rng & Units 07/20/2022    9:24 AM 04/07/2022    3:01 PM 12/07/2021   10:56 AM  CMP  Glucose 70 - 99 mg/dL  91  562  89   BUN 8 - 23 mg/dL 25  22  27    Creatinine 0.44 - 1.00 mg/dL 1.30  8.65  7.84   Sodium 135 - 145 mmol/L 141  141  137   Potassium 3.5 - 5.1 mmol/L 4.6  4.8  5.3   Chloride 98 - 111 mmol/L 105  111  103   CO2 22 - 32 mmol/L 31  25  21    Calcium 8.9 - 10.3 mg/dL 9.3  9.5  9.6   Total Protein 6.5 - 8.1 g/dL 6.9  7.8  6.4   Total Bilirubin 0.3 - 1.2 mg/dL 0.4  0.6  0.2   Alkaline Phos 38 - 126 U/L 73  96  123   AST 15 - 41 U/L 19  31  25    ALT 0 - 44 U/L 13  18  17        RADIOGRAPHIC STUDIES: I have personally reviewed the radiological images as listed and agreed with the findings in the report. No results found.    Orders Placed This Encounter  Procedures   CBC with Differential/Platelet    Standing Status:   Standing    Number of Occurrences:   50    Standing Expiration Date:   07/25/2024   All questions were answered. The patient knows to call the clinic with any problems, questions or concerns. No barriers to learning was detected. The total time spent in the appointment was 25 minutes.     Malachy Mood, MD 07/26/2023

## 2023-07-26 NOTE — Assessment & Plan Note (Signed)
invasive ductal carcinoma, multi-focal, stage IA (pmT2N1aM0), ER+/PR+/HER2-, G1  -diagnosed 01/2018, s/p right lumpectomy and ALND, and adjuvant RT. oncotype showed low risk, adjuvant chemo was not recommended.  -began adjuvant AI with letrozole in 07/2018, tolerating well with stable joint pain.  She switched to tamoxifen 11/2021 due to osteoporosis.  Goal of therapy is a total of 5-10 years -Amy Roman is clinically doing well.  Tolerating tamoxifen.  Breast exam is benign, labs are stable.  I encouraged her to hydrate.  No clinical concern for breast cancer recurrence.  Mammogram is scheduled for later today, will follow  -She is 5 years out from initial diagnosis, the recurrence risk has decreased.  Continue surveillance and tamoxifen -Follow-up anually

## 2023-07-27 LAB — FERRITIN: Ferritin: 26 ng/mL (ref 11–307)

## 2023-07-27 LAB — CANCER ANTIGEN 27.29: CA 27.29: 33.8 U/mL (ref 0.0–38.6)

## 2023-08-18 ENCOUNTER — Encounter: Payer: Self-pay | Admitting: Nurse Practitioner

## 2023-08-18 ENCOUNTER — Ambulatory Visit
Admission: RE | Admit: 2023-08-18 | Discharge: 2023-08-18 | Disposition: A | Discharge status: Home / Self Care | Payer: 59 | Source: Ambulatory Visit | Attending: Family Medicine | Admitting: Family Medicine

## 2023-08-18 DIAGNOSIS — Z1231 Encounter for screening mammogram for malignant neoplasm of breast: Secondary | ICD-10-CM

## 2023-08-22 ENCOUNTER — Encounter: Payer: Self-pay | Admitting: Nurse Practitioner

## 2023-08-29 ENCOUNTER — Encounter (INDEPENDENT_AMBULATORY_CARE_PROVIDER_SITE_OTHER): Payer: Medicare Other | Admitting: Family Medicine

## 2023-11-15 ENCOUNTER — Other Ambulatory Visit: Payer: Self-pay | Admitting: Nurse Practitioner

## 2023-11-22 ENCOUNTER — Encounter: Payer: Self-pay | Admitting: Nurse Practitioner

## 2023-11-22 ENCOUNTER — Other Ambulatory Visit: Payer: Self-pay

## 2023-11-22 ENCOUNTER — Encounter (HOSPITAL_COMMUNITY): Payer: Self-pay | Admitting: *Deleted

## 2023-11-22 ENCOUNTER — Emergency Department (HOSPITAL_COMMUNITY)
Admission: EM | Admit: 2023-11-22 | Discharge: 2023-11-22 | Discharge status: Against Medical Advice | Payer: 59 | Attending: Emergency Medicine | Admitting: Emergency Medicine

## 2023-11-22 ENCOUNTER — Emergency Department (HOSPITAL_COMMUNITY): Payer: 59

## 2023-11-22 DIAGNOSIS — Z5321 Procedure and treatment not carried out due to patient leaving prior to being seen by health care provider: Secondary | ICD-10-CM | POA: Insufficient documentation

## 2023-11-22 DIAGNOSIS — J101 Influenza due to other identified influenza virus with other respiratory manifestations: Secondary | ICD-10-CM | POA: Insufficient documentation

## 2023-11-22 DIAGNOSIS — Z20822 Contact with and (suspected) exposure to covid-19: Secondary | ICD-10-CM | POA: Insufficient documentation

## 2023-11-22 DIAGNOSIS — R059 Cough, unspecified: Secondary | ICD-10-CM | POA: Diagnosis present

## 2023-11-22 LAB — BASIC METABOLIC PANEL
Anion gap: 9 (ref 5–15)
BUN: 21 mg/dL (ref 8–23)
CO2: 23 mmol/L (ref 22–32)
Calcium: 9 mg/dL (ref 8.9–10.3)
Chloride: 107 mmol/L (ref 98–111)
Creatinine, Ser: 1.18 mg/dL — ABNORMAL HIGH (ref 0.44–1.00)
GFR, Estimated: 49 mL/min — ABNORMAL LOW (ref >60)
Glucose, Bld: 102 mg/dL — ABNORMAL HIGH (ref 70–99)
Potassium: 5 mmol/L (ref 3.5–5.1)
Sodium: 139 mmol/L (ref 135–145)

## 2023-11-22 LAB — CBC WITH DIFFERENTIAL/PLATELET
Abs Immature Granulocytes: 0.01 10*3/uL (ref 0.00–0.07)
Basophils Absolute: 0 10*3/uL (ref 0.0–0.1)
Basophils Relative: 0 %
Eosinophils Absolute: 0 10*3/uL (ref 0.0–0.5)
Eosinophils Relative: 0 %
HCT: 43.8 % (ref 36.0–46.0)
Hemoglobin: 14 g/dL (ref 12.0–15.0)
Immature Granulocytes: 0 %
Lymphocytes Relative: 37 %
Lymphs Abs: 1 10*3/uL (ref 0.7–4.0)
MCH: 30.4 pg (ref 26.0–34.0)
MCHC: 32 g/dL (ref 30.0–36.0)
MCV: 95.2 fL (ref 80.0–100.0)
Monocytes Absolute: 0.4 10*3/uL (ref 0.1–1.0)
Monocytes Relative: 13 %
Neutro Abs: 1.4 10*3/uL — ABNORMAL LOW (ref 1.7–7.7)
Neutrophils Relative %: 50 %
Platelets: 160 10*3/uL (ref 150–400)
RBC: 4.6 MIL/uL (ref 3.87–5.11)
RDW: 13.9 % (ref 11.5–15.5)
WBC: 2.8 10*3/uL — ABNORMAL LOW (ref 4.0–10.5)
nRBC: 0 % (ref 0.0–0.2)

## 2023-11-22 LAB — RESP PANEL BY RT-PCR (RSV, FLU A&B, COVID)  RVPGX2
Influenza A by PCR: POSITIVE — AB
Influenza B by PCR: NEGATIVE
Resp Syncytial Virus by PCR: NEGATIVE
SARS Coronavirus 2 by RT PCR: NEGATIVE

## 2023-11-22 NOTE — ED Notes (Signed)
Called x3 no answer, check pts in lobby and non matching the name

## 2023-11-22 NOTE — ED Triage Notes (Signed)
The pt reports that she has had a cough and  weakness for 2 weeks she feels tired all the time

## 2023-11-22 NOTE — ED Provider Triage Note (Signed)
Emergency Medicine Provider Triage Evaluation Note  Amy Roman , a 73 y.o. female  was evaluated in triage.  Pt complains of cough.  Review of Systems  Positive:  Negative:   Physical Exam  There were no vitals taken for this visit. Gen:   Awake, no distress   Resp:  Normal effort  MSK:   Moves extremities without difficulty  Other:    Medical Decision Making  Medically screening exam initiated at 4:21 PM.  Appropriate orders placed.  Amy Roman was informed that the remainder of the evaluation will be completed by another provider, this initial triage assessment does not replace that evaluation, and the importance of remaining in the ED until their evaluation is complete.  Productive cough x2 weeks. Stating "I need something for cough". Denies fever, nausea, vomiting, diarrhea. Has not been able to eat much and is intermittently dizzy as well. Denies chest pain, SOB.   Valrie Hart F, [place] 11/22/23 1623

## 2024-01-16 ENCOUNTER — Other Ambulatory Visit (INDEPENDENT_AMBULATORY_CARE_PROVIDER_SITE_OTHER)

## 2024-01-16 ENCOUNTER — Encounter: Payer: Self-pay | Admitting: Orthopaedic Surgery

## 2024-01-16 ENCOUNTER — Ambulatory Visit (INDEPENDENT_AMBULATORY_CARE_PROVIDER_SITE_OTHER): Admitting: Orthopaedic Surgery

## 2024-01-16 VITALS — Ht 60.0 in | Wt 202.0 lb

## 2024-01-16 DIAGNOSIS — M1712 Unilateral primary osteoarthritis, left knee: Secondary | ICD-10-CM | POA: Diagnosis not present

## 2024-01-16 NOTE — Progress Notes (Signed)
 Office Visit Note   Patient: Amy Roman           Date of Birth: 10/20/1951           MRN: [id number] Visit Date: 01/16/2024              Requested by: Leilani Able, MD [address] [place],  [place] [address] PCP: Leilani Able, MD   Assessment & Plan: Visit Diagnoses:  1. Primary osteoarthritis of left knee     Plan: At this point the patient has achieved a BMI of less than 40.  We will move forward with scheduling for left total knee replacement.  Risk benefits prognosis reviewed.  She did really well from her right knee replacement.  Questions encouraged and answered.  Impression is severe left knee degenerative joint disease secondary to Osteoarthritis.  Bone on bone joint space narrowing is seen on radiographs with significant varus alignment.  At this point, conservative treatments fail to provide any significant relief and the pain is severely affecting ADLs and quality of life.  Based on treatment options, the patient has elected to move forward with a knee replacement.  We have discussed the surgical risks that include but are not limited to infection, DVT, leg length discrepancy, stiffness, numbness, tingling, incomplete relief of pain.  Recovery and prognosis were also reviewed.    Anticoagulants: aspirin 81 mg Postop anticoagulation: eliquis Diabetic: No  Nickel allergy: No Prior DVT/PE: No Tobacco use: No Clearances needed for surgery: PCP Anticipated discharge dispo: Home   Follow-Up Instructions: No follow-ups on file.   Orders:  Orders Placed This Encounter  Procedures   XR KNEE 3 VIEW LEFT   No orders of the defined types were placed in this encounter.     Procedures: No procedures performed   Clinical Data: No additional findings.   Subjective: Chief Complaint  Patient presents with   Left Knee - Pain    HPI Patient returns today for follow-up evaluation of left knee DJD.  She is severe DJD with varus deformity.  She has been  working on weight loss to achieve a BMI of less than 40. Review of Systems  Constitutional: Negative.   HENT: Negative.    Eyes: Negative.   Respiratory: Negative.    Cardiovascular: Negative.   Endocrine: Negative.   Musculoskeletal: Negative.   Neurological: Negative.   Hematological: Negative.   Psychiatric/Behavioral: Negative.    All other systems reviewed and are negative.    Objective: Vital Signs: Ht 5' (1.524 m)   Wt 202 lb (91.6 kg)   BMI 39.45 kg/m   Physical Exam Vitals and nursing note reviewed.  Constitutional:      Appearance: She is well-developed.  HENT:     Head: Atraumatic.     Nose: Nose normal.  Eyes:     Extraocular Movements: Extraocular movements intact.  Cardiovascular:     Pulses: Normal pulses.  Pulmonary:     Effort: Pulmonary effort is normal.  Abdominal:     Palpations: Abdomen is soft.  Musculoskeletal:     Cervical back: Neck supple.  Skin:    General: Skin is warm.     Capillary Refill: Capillary refill takes less than 2 seconds.  Neurological:     Mental Status: She is alert. Mental status is at baseline.  Psychiatric:        Behavior: Behavior normal.        Thought Content: Thought content normal.  Judgment: Judgment normal.     Ortho Exam Exam of the left knee is unchanged from prior visit. Specialty Comments:  No specialty comments available.  Imaging: XR KNEE 3 VIEW LEFT Result Date: 01/16/2024 X-rays of the left knee show advanced tricompartmental degenerative joint disease.  Bone-on-bone joint space narrowing.  Severe varus deformity.    PMFS History: Patient Active Problem List   Diagnosis Date Noted   Primary osteoarthritis of left knee 04/21/2021   Status post total knee replacement, right 09/14/2020   Primary osteoarthritis of right knee 09/13/2020   Iron deficiency anemia 07/23/2018   Breast cancer metastasized to axillary lymph node, right (HCC) 04/17/2018   Malignant neoplasm of lower-outer  quadrant of right breast of female, estrogen receptor positive (HCC) 03/11/2018   Post-operative state 03/06/2018   Uterine hyperplasia 10/11/2017   Anxiety and depression 10/15/2014   Vitamin B 12 deficiency 06/02/2014   Vitamin D deficiency 06/02/2014   Hereditary and idiopathic peripheral neuropathy 05/14/2014   Past Medical History:  Diagnosis Date   Anemia    Anxiety and depression 10/15/2014   Arthritis    BILATERAL KNEES, HAD CORTISONE INJECTION 03/2017   Brain cancer (HCC)    Breast cancer (HCC) 2019   Right Breast Cancer   Depression    GERD (gastroesophageal reflux disease)    OCCASIONAL WITH CERTAIN FOOD   Headache    Heart murmur    History of radiation therapy 05/28/18- 07/10/18   Right Breast 25 fractions for a total dose of 50 Gy, Right SCV and PAB 25 fractions for a total dose of 50 Gy, right breast boost, 5 fractions for a total dose of 10 Gy.    Personal history of radiation therapy    Right Breast Cancer   Seasonal allergies    SVD (spontaneous vaginal delivery)    x 4   Unspecified hereditary and idiopathic peripheral neuropathy 05/14/2014   LEFT KNEE AND FOOT    Family History  Problem Relation Age of Onset   Diabetes Mother    Neuropathy Mother    Other Father    Neuropathy Sister    Cancer Sister        unknown type cancer in neck    Cancer Maternal Aunt        leukemia    Colon cancer Neg Hx    Breast cancer Neg Hx     Past Surgical History:  Procedure Laterality Date   ABDOMINAL HYSTERECTOMY     AXILLARY LYMPH NODE DISSECTION Right 04/17/2018   Procedure: AXILLARY LYMPH NODE DISSECTION;  Surgeon: Almond Lint, MD;  Location: [facility];  Service: General;  Laterality: Right;   BREAST EXCISIONAL BIOPSY Right 2007   BREAST LUMPECTOMY Right 2019   BREAST LUMPECTOMY WITH RADIOACTIVE SEED AND SENTINEL LYMPH NODE BIOPSY Right 04/05/2018   Procedure: RIGHT BREAST SEED BRACKETED LUMPECTOMY WITH SENTINEL LYMPH NODE BIOPSY;  Surgeon:  Almond Lint, MD;  Location: [facility];  Service: General;  Laterality: Right;   BREAST SURGERY     bx, no cancer    CATARACT EXTRACTION Bilateral    DILATATION & CURETTAGE/HYSTEROSCOPY WITH MYOSURE N/A 08/09/2017   Procedure: DILATATION & CURETTAGE/HYSTEROSCOPY WITH MYOSURE;  Surgeon: Allie Bossier, MD;  Location: [facility];  Service: Gynecology;  Laterality: N/A;   LABIOPLASTY  03/06/2018   Procedure: LABIAPLASTY;  Surgeon: Allie Bossier, MD;  Location: [facility];  Service: Gynecology;;   TOTAL KNEE ARTHROPLASTY Right 09/14/2020   Procedure: RIGHT TOTAL KNEE  ARTHROPLASTY;  Surgeon: Tarry Kos, MD;  Location: [facility];  Service: Orthopedics;  Laterality: Right;   VAGINAL HYSTERECTOMY Bilateral 03/06/2018   Procedure: HYSTERECTOMY VAGINAL WITH BILATERAL SALPINGOOPHORECTOMY;  Surgeon: Allie Bossier, MD;  Location: [facility];  Service: Gynecology;  Laterality: Bilateral;   Social History   Occupational History   Occupation: house   Tobacco Use   Smoking status: Never   Smokeless tobacco: Never  Vaping Use   Vaping status: Never Used  Substance and Sexual Activity   Alcohol use: No   Drug use: No   Sexual activity: Not on file

## 2024-02-01 ENCOUNTER — Other Ambulatory Visit: Payer: Self-pay | Admitting: Nurse Practitioner

## 2024-03-15 ENCOUNTER — Telehealth: Payer: Self-pay | Admitting: Orthopaedic Surgery

## 2024-03-15 NOTE — Telephone Encounter (Signed)
 Patient's son Mo is calling to request a letter to [place] embassy from Dr. Christiane Cowing asking for assistance after total knee surgery scheduled 04-22-24.  Patient's sister can come and help but the letter would be necessary.  Mo states this has been done in the past. If more detail is needed, he can be reached at [phone]

## 2024-03-19 NOTE — Telephone Encounter (Signed)
 Last note was written as below. Do you want the same thing, but different dates of course?  Amy Roman was seen in my clinic on 10/12/2021. She will need Glyn Laser to come to the [place] to help her 1 month before surgery and 3 months after surgery.

## 2024-03-19 NOTE — Telephone Encounter (Signed)
Sure that's fine thanks 

## 2024-03-20 ENCOUNTER — Telehealth: Payer: Self-pay

## 2024-03-20 ENCOUNTER — Telehealth: Payer: Self-pay | Admitting: Orthopaedic Surgery

## 2024-03-20 NOTE — Telephone Encounter (Signed)
 Called Patient son back to advise letter was approved by Dr. Christiane Cowing. Letter was made. He would like this emailed to email on file.  Emailed.   He will call us  back if unable to download from email.

## 2024-03-20 NOTE — Telephone Encounter (Signed)
 Patient's son Mo is requesting the letter for the embassy to be sent to his email because he is unable to access My Chart at this time.  Please call him if there is a problem with this request.  [phone]

## 2024-03-21 NOTE — Telephone Encounter (Signed)
 Letter was emailed yesterday by Paola Bohr.

## 2024-04-09 ENCOUNTER — Other Ambulatory Visit: Payer: Self-pay | Admitting: Physician Assistant

## 2024-04-09 MED ORDER — ONDANSETRON HCL 4 MG PO TABS: 4.0000 mg | ORAL_TABLET | Freq: Three times a day (TID) | ORAL | 0 refills | Status: DC | PRN

## 2024-04-09 MED ORDER — OXYCODONE-ACETAMINOPHEN 5-325 MG PO TABS: 1.0000 | ORAL_TABLET | Freq: Four times a day (QID) | ORAL | 0 refills | Status: DC | PRN

## 2024-04-09 MED ORDER — DOCUSATE SODIUM 100 MG PO CAPS: 100.0000 mg | ORAL_CAPSULE | Freq: Every day | ORAL | 2 refills | Status: DC | PRN

## 2024-04-09 MED ORDER — METHOCARBAMOL 750 MG PO TABS: 750.0000 mg | ORAL_TABLET | Freq: Three times a day (TID) | ORAL | 2 refills | Status: DC | PRN

## 2024-04-09 NOTE — Pre-Procedure Instructions (Signed)
 Surgical Instructions   Your procedure is scheduled on April 22, 2024. Report to [facility] Main Entrance A at 6:15 A.M., then check in with the Admitting office. Any questions or running late day of surgery: call [phone]  Questions prior to your surgery date: call [phone], Monday-Friday, 8am-4pm. If you experience any cold or flu symptoms such as cough, fever, chills, shortness of breath, etc. between now and your scheduled surgery, please notify us  at the above number.     Remember:  Do not eat after midnight the night before your surgery  You may drink clear liquids until 5:45 AM the morning of your surgery.   Clear liquids allowed are: Water , Non-Citrus Juices (without pulp), Carbonated Beverages, Clear Tea (no milk, honey, etc.), Black Coffee Only (NO MILK, CREAM OR POWDERED CREAMER of any kind), and Gatorade.  Patient Instructions  The night before surgery:  No food after midnight. ONLY clear liquids after midnight  The day of surgery (if you do NOT have diabetes):  Drink ONE (1) Pre-Surgery Clear Ensure by 5:45 AM the morning of surgery. Drink in one sitting. Do not sip.  This drink was given to you during your hospital  pre-op appointment visit.  Nothing else to drink after completing the  Pre-Surgery Clear Ensure.         If you have questions, please contact your surgeon's office.   Take these medicines the morning of surgery with A SIP OF WATER : FLUoxetine  (PROZAC )  gabapentin  (NEURONTIN )  omeprazole  (PRILOSEC)  tamoxifen  (NOLVADEX )    May take these medicines IF NEEDED: hydrOXYzine  (ATARAX /VISTARIL )  methocarbamol  (ROBAXIN )    One week prior to surgery, STOP taking any Aspirin  (unless otherwise instructed by your surgeon) Aleve, Naproxen, Ibuprofen , Motrin , Advil , Goody's, BC's, all herbal medications, fish oil, and non-prescription vitamins.                     Do NOT Smoke (Tobacco/Vaping) for 24 hours prior to your procedure.  If you use a  CPAP at night, you may bring your mask/headgear for your overnight stay.   You will be asked to remove any contacts, glasses, piercing's, hearing aid's, dentures/partials prior to surgery. Please bring cases for these items if needed.    Patients discharged the day of surgery will not be allowed to drive home, and someone needs to stay with them for 24 hours.  SURGICAL WAITING ROOM VISITATION Patients may have no more than 2 support people in the waiting area - these visitors may rotate.   Pre-op nurse will coordinate an appropriate time for 1 ADULT support person, who may not rotate, to accompany patient in pre-op.  Children under the age of 73 must have an adult with them who is not the patient and must remain in the main waiting area with an adult.  If the patient needs to stay at the hospital during part of their recovery, the visitor guidelines for inpatient rooms apply.  Please refer to the [facility] website for the visitor guidelines for any additional information.   If you received a COVID test during your pre-op visit  it is requested that you wear a mask when out in public, stay away from anyone that may not be feeling well and notify your surgeon if you develop symptoms. If you have been in contact with anyone that has tested positive in the last 10 days please notify you surgeon.      Pre-operative 5 CHG Bathing Instructions   You can  play a key role in reducing the risk of infection after surgery. Your skin needs to be as free of germs as possible. You can reduce the number of germs on your skin by washing with CHG (chlorhexidine  gluconate) soap before surgery. CHG is an antiseptic soap that kills germs and continues to kill germs even after washing.   DO NOT use if you have an allergy to chlorhexidine /CHG or antibacterial soaps. If your skin becomes reddened or irritated, stop using the CHG and notify one of our RNs at [phone].   Please shower with the CHG soap  starting 4 days before surgery using the following schedule:     Please keep in mind the following:  DO NOT shave, including legs and underarms, starting the day of your first shower.   You may shave your face at any point before/day of surgery.  Place clean sheets on your bed the day you start using CHG soap. Use a clean washcloth (not used since being washed) for each shower. DO NOT sleep with pets once you start using the CHG.   CHG Shower Instructions:  Wash your face and private area with normal soap. If you choose to wash your hair, wash first with your normal shampoo.  After you use shampoo/soap, rinse your hair and body thoroughly to remove shampoo/soap residue.  Turn the water  OFF and apply about 3 tablespoons (45 ml) of CHG soap to a CLEAN washcloth.  Apply CHG soap ONLY FROM YOUR NECK DOWN TO YOUR TOES (washing for 3-5 minutes)  DO NOT use CHG soap on face, private areas, open wounds, or sores.  Pay special attention to the area where your surgery is being performed.  If you are having back surgery, having someone wash your back for you may be helpful. Wait 2 minutes after CHG soap is applied, then you may rinse off the CHG soap.  Pat dry with a clean towel  Put on clean clothes/pajamas   If you choose to wear lotion, please use ONLY the CHG-compatible lotions that are listed below.  Additional instructions for the day of surgery: DO NOT APPLY any lotions, deodorants, cologne, or perfumes.   Do not bring valuables to the hospital. [facility] is not responsible for any belongings/valuables. Do not wear nail polish, gel polish, artificial nails, or any other type of covering on natural nails (fingers and toes) Do not wear jewelry or makeup Put on clean/comfortable clothes.  Please brush your teeth.  Ask your nurse before applying any prescription medications to the skin.     CHG Compatible Lotions   Aveeno Moisturizing lotion  Cetaphil Moisturizing Cream  Cetaphil  Moisturizing Lotion  Clairol Herbal Essence Moisturizing Lotion, Dry Skin  Clairol Herbal Essence Moisturizing Lotion, Extra Dry Skin  Clairol Herbal Essence Moisturizing Lotion, Normal Skin  Curel Age Defying Therapeutic Moisturizing Lotion with Alpha Hydroxy  Curel Extreme Care Body Lotion  Curel Soothing Hands Moisturizing Hand Lotion  Curel Therapeutic Moisturizing Cream, Fragrance-Free  Curel Therapeutic Moisturizing Lotion, Fragrance-Free  Curel Therapeutic Moisturizing Lotion, Original Formula  Eucerin Daily Replenishing Lotion  Eucerin Dry Skin Therapy Plus Alpha Hydroxy Crme  Eucerin Dry Skin Therapy Plus Alpha Hydroxy Lotion  Eucerin Original Crme  Eucerin Original Lotion  Eucerin Plus Crme Eucerin Plus Lotion  Eucerin TriLipid Replenishing Lotion  Keri Anti-Bacterial Hand Lotion  Keri Deep Conditioning Original Lotion Dry Skin Formula Softly Scented  Keri Deep Conditioning Original Lotion, Fragrance Free Sensitive Skin Formula  Keri Lotion Fast Absorbing Fragrance Free  Sensitive Skin Formula  Keri Lotion Fast Absorbing Softly Scented Dry Skin Formula  Keri Original Lotion  Keri Skin Renewal Lotion Keri Silky Smooth Lotion  Keri Silky Smooth Sensitive Skin Lotion  Nivea Body Creamy Conditioning Oil  Nivea Body Extra Enriched Lotion  Nivea Body Original Lotion  Nivea Body Sheer Moisturizing Lotion Nivea Crme  Nivea Skin Firming Lotion  NutraDerm 30 Skin Lotion  NutraDerm Skin Lotion  NutraDerm Therapeutic Skin Cream  NutraDerm Therapeutic Skin Lotion  ProShield Protective Hand Cream  Provon moisturizing lotion  Please read over the following fact sheets that you were given.

## 2024-04-10 ENCOUNTER — Encounter (HOSPITAL_COMMUNITY): Payer: Self-pay

## 2024-04-10 ENCOUNTER — Other Ambulatory Visit: Payer: Self-pay

## 2024-04-10 ENCOUNTER — Encounter (HOSPITAL_COMMUNITY)
Admission: RE | Admit: 2024-04-10 | Discharge: 2024-04-10 | Disposition: A | Discharge status: Home / Self Care | Source: Ambulatory Visit | Attending: Orthopaedic Surgery | Admitting: Orthopaedic Surgery

## 2024-04-10 DIAGNOSIS — M1712 Unilateral primary osteoarthritis, left knee: Secondary | ICD-10-CM | POA: Insufficient documentation

## 2024-04-10 DIAGNOSIS — Z01812 Encounter for preprocedural laboratory examination: Secondary | ICD-10-CM | POA: Insufficient documentation

## 2024-04-10 LAB — CBC
HCT: 39.5 % (ref 36.0–46.0)
Hemoglobin: 12.5 g/dL (ref 12.0–15.0)
MCH: 30.9 pg (ref 26.0–34.0)
MCHC: 31.6 g/dL (ref 30.0–36.0)
MCV: 97.5 fL (ref 80.0–100.0)
Platelets: 222 10*3/uL (ref 150–400)
RBC: 4.05 MIL/uL (ref 3.87–5.11)
RDW: 13.7 % (ref 11.5–15.5)
WBC: 3.9 10*3/uL — ABNORMAL LOW (ref 4.0–10.5)
nRBC: 0 % (ref 0.0–0.2)

## 2024-04-10 LAB — BASIC METABOLIC PANEL WITH GFR
Anion gap: 8 (ref 5–15)
BUN: 14 mg/dL (ref 8–23)
CO2: 27 mmol/L (ref 22–32)
Calcium: 9.5 mg/dL (ref 8.9–10.3)
Chloride: 108 mmol/L (ref 98–111)
Creatinine, Ser: 1.01 mg/dL — ABNORMAL HIGH (ref 0.44–1.00)
GFR, Estimated: 59 mL/min — ABNORMAL LOW (ref >60)
Glucose, Bld: 96 mg/dL (ref 70–99)
Potassium: 5.2 mmol/L — ABNORMAL HIGH (ref 3.5–5.1)
Sodium: 143 mmol/L (ref 135–145)

## 2024-04-10 NOTE — Pre-Procedure Instructions (Addendum)
 Surgical Instructions   Your procedure is scheduled on April 22, 2024. Report to [facility] Main Entrance A at 6:45 A.M., then check in with the Admitting office. Any questions or running late day of surgery: call [phone]  Questions prior to your surgery date: call [phone], Monday-Friday, 8am-4pm. If you experience any cold or flu symptoms such as cough, fever, chills, shortness of breath, etc. between now and your scheduled surgery, please notify us  at the above number.     Remember:  Do not eat after midnight the night before your surgery  You may drink clear liquids until 5:45 AM the morning of your surgery.   Clear liquids allowed are: Water , Non-Citrus Juices (without pulp), Carbonated Beverages, Clear Tea (no milk, honey, etc.), Black Coffee Only (NO MILK, CREAM OR POWDERED CREAMER of any kind), and Gatorade.  Patient Instructions  The night before surgery:  No food after midnight. ONLY clear liquids after midnight  The day of surgery (if you do NOT have diabetes):  Drink ONE (1) Pre-Surgery Clear Ensure by 5:45 AM the morning of surgery. Drink in one sitting. Do not sip.  This drink was given to you during your hospital  pre-op appointment visit.  Nothing else to drink after completing the  Pre-Surgery Clear Ensure.         If you have questions, please contact your surgeon's office.   Take these medicines the morning of surgery with A SIP OF WATER : FLUoxetine  (PROZAC )  gabapentin  (NEURONTIN )  omeprazole  (PRILOSEC)  tamoxifen  (NOLVADEX )    May take these medicines IF NEEDED: hydrOXYzine  (ATARAX /VISTARIL )  methocarbamol  (ROBAXIN )  Pepcid    One week prior to surgery, STOP taking any Aspirin  (unless otherwise instructed by your surgeon) Aleve, Naproxen, Ibuprofen , Motrin , Advil , Goody's, BC's, all herbal medications, fish oil, and non-prescription vitamins.                     Do NOT Smoke (Tobacco/Vaping) for 24 hours prior to your procedure.  If you  use a CPAP at night, you may bring your mask/headgear for your overnight stay.   You will be asked to remove any contacts, glasses, piercing's, hearing aid's, dentures/partials prior to surgery. Please bring cases for these items if needed.    Patients discharged the day of surgery will not be allowed to drive home, and someone needs to stay with them for 24 hours.  SURGICAL WAITING ROOM VISITATION Patients may have no more than 2 support people in the waiting area - these visitors may rotate.   Pre-op nurse will coordinate an appropriate time for 1 ADULT support person, who may not rotate, to accompany patient in pre-op.  Children under the age of 61 must have an adult with them who is not the patient and must remain in the main waiting area with an adult.  If the patient needs to stay at the hospital during part of their recovery, the visitor guidelines for inpatient rooms apply.  Please refer to the [facility] website for the visitor guidelines for any additional information.   If you received a COVID test during your pre-op visit  it is requested that you wear a mask when out in public, stay away from anyone that may not be feeling well and notify your surgeon if you develop symptoms. If you have been in contact with anyone that has tested positive in the last 10 days please notify you surgeon.      Pre-operative 5 CHG Bathing Instructions   You  can play a key role in reducing the risk of infection after surgery. Your skin needs to be as free of germs as possible. You can reduce the number of germs on your skin by washing with CHG (chlorhexidine  gluconate) soap before surgery. CHG is an antiseptic soap that kills germs and continues to kill germs even after washing.   DO NOT use if you have an allergy to chlorhexidine /CHG or antibacterial soaps. If your skin becomes reddened or irritated, stop using the CHG and notify one of our RNs at [phone].   Please shower with the CHG soap  starting 4 days before surgery using the following schedule:     Please keep in mind the following:  DO NOT shave, including legs and underarms, starting the day of your first shower.   You may shave your face at any point before/day of surgery.  Place clean sheets on your bed the day you start using CHG soap. Use a clean washcloth (not used since being washed) for each shower. DO NOT sleep with pets once you start using the CHG.   CHG Shower Instructions:  Wash your face and private area with normal soap. If you choose to wash your hair, wash first with your normal shampoo.  After you use shampoo/soap, rinse your hair and body thoroughly to remove shampoo/soap residue.  Turn the water  OFF and apply about 3 tablespoons (45 ml) of CHG soap to a CLEAN washcloth.  Apply CHG soap ONLY FROM YOUR NECK DOWN TO YOUR TOES (washing for 3-5 minutes)  DO NOT use CHG soap on face, private areas, open wounds, or sores.  Pay special attention to the area where your surgery is being performed.  If you are having back surgery, having someone wash your back for you may be helpful. Wait 2 minutes after CHG soap is applied, then you may rinse off the CHG soap.  Pat dry with a clean towel  Put on clean clothes/pajamas   If you choose to wear lotion, please use ONLY the CHG-compatible lotions that are listed below.  Additional instructions for the day of surgery: DO NOT APPLY any lotions, deodorants, cologne, or perfumes.   Do not bring valuables to the hospital. [facility] is not responsible for any belongings/valuables. Do not wear nail polish, gel polish, artificial nails, or any other type of covering on natural nails (fingers and toes) Do not wear jewelry or makeup Put on clean/comfortable clothes.  Please brush your teeth.  Ask your nurse before applying any prescription medications to the skin.     CHG Compatible Lotions   Aveeno Moisturizing lotion  Cetaphil Moisturizing Cream  Cetaphil  Moisturizing Lotion  Clairol Herbal Essence Moisturizing Lotion, Dry Skin  Clairol Herbal Essence Moisturizing Lotion, Extra Dry Skin  Clairol Herbal Essence Moisturizing Lotion, Normal Skin  Curel Age Defying Therapeutic Moisturizing Lotion with Alpha Hydroxy  Curel Extreme Care Body Lotion  Curel Soothing Hands Moisturizing Hand Lotion  Curel Therapeutic Moisturizing Cream, Fragrance-Free  Curel Therapeutic Moisturizing Lotion, Fragrance-Free  Curel Therapeutic Moisturizing Lotion, Original Formula  Eucerin Daily Replenishing Lotion  Eucerin Dry Skin Therapy Plus Alpha Hydroxy Crme  Eucerin Dry Skin Therapy Plus Alpha Hydroxy Lotion  Eucerin Original Crme  Eucerin Original Lotion  Eucerin Plus Crme Eucerin Plus Lotion  Eucerin TriLipid Replenishing Lotion  Keri Anti-Bacterial Hand Lotion  Keri Deep Conditioning Original Lotion Dry Skin Formula Softly Scented  Keri Deep Conditioning Original Lotion, Fragrance Free Sensitive Skin Formula  Keri Lotion Fast Absorbing Fragrance  Free Sensitive Skin Formula  Keri Lotion Fast Absorbing Softly Scented Dry Skin Formula  Keri Original Lotion  Keri Skin Renewal Lotion Keri Silky Smooth Lotion  Keri Silky Smooth Sensitive Skin Lotion  Nivea Body Creamy Conditioning Oil  Nivea Body Extra Enriched Lotion  Nivea Body Original Lotion  Nivea Body Sheer Moisturizing Lotion Nivea Crme  Nivea Skin Firming Lotion  NutraDerm 30 Skin Lotion  NutraDerm Skin Lotion  NutraDerm Therapeutic Skin Cream  NutraDerm Therapeutic Skin Lotion  ProShield Protective Hand Cream  Provon moisturizing lotion  Please read over the following fact sheets that you were given.

## 2024-04-10 NOTE — Progress Notes (Signed)
 PCP -  Dr Danella Dunn Cardiologist - none  Chest x-ray - 11/22/23 EKG - n/a Stress Test - n/a ECHO - n/a Cardiac Cath - n/a  ICD Pacemaker/Loop - n/a  Sleep Study -  n/a CPAP - none  Diabetes - n/a  Blood Thinner Instructions:  n/a  Aspirin  Instructions: to be taken after surgery.  ERAS -clear liquids til 5:45 AM DOS PRE-SURGERY Ensure given with instructions.  Anesthesia review: no  STOP now taking any Aspirin  (unless otherwise instructed by your surgeon), Aleve, Naproxen, Ibuprofen , Motrin , Advil , Goody's, BC's, all herbal medications, fish oil, and all vitamins.   Coronavirus Screening Do you have any of the following symptoms:  Cough yes/no: No Fever (>100.54F)  yes/no: No Runny nose yes/no: No Sore throat yes/no: No Difficulty breathing/shortness of breath  yes/no: No  Have you traveled in the last 14 days and where? yes/no: No  Patient verbalized understanding of instructions that were given to them at the PAT appointment via [facility] [place].

## 2024-04-19 MED ORDER — TRANEXAMIC ACID 1000 MG/10ML IV SOLN
2000.0000 mg | INTRAVENOUS | Status: DC
  Filled 2024-04-19: qty 20

## 2024-04-21 NOTE — Anesthesia Preprocedure Evaluation (Signed)
 Anesthesia Evaluation  Patient identified by MRN, date of birth, ID band Patient awake    Reviewed: Allergy & Precautions, NPO status , Patient's Chart, lab work & pertinent test results  Airway Mallampati: III  TM Distance: >3 FB Neck ROM: Full    Dental  (+) Edentulous Upper, Missing   Pulmonary neg pulmonary ROS   Pulmonary exam normal breath sounds clear to auscultation       Cardiovascular negative cardio ROS Normal cardiovascular exam Rhythm:Regular Rate:Normal  ECG: NSR, rate 79   Neuro/Psych  Headaches PSYCHIATRIC DISORDERS Anxiety Depression    Unspecified hereditary and idiopathic peripheral neuropathy  Neuromuscular disease    GI/Hepatic Neg liver ROS,GERD  Medicated and Controlled,,  Endo/Other  negative endocrine ROS    Renal/GU negative Renal ROS     Musculoskeletal  (+) Arthritis ,    Abdominal  (+) + obese  Peds  Hematology negative hematology ROS (+)   Anesthesia Other Findings   Reproductive/Obstetrics                              Anesthesia Physical Anesthesia Plan  ASA: 3  Anesthesia Plan: Spinal and Regional   Post-op Pain Management: Regional block*, Minimal or no pain anticipated, Tylenol  PO (pre-op)* and Celebrex  PO (pre-op)*   Induction: Intravenous  PONV Risk Score and Plan: 2 and Propofol  infusion, Ondansetron , Dexamethasone , Treatment may vary due to age or medical condition and Midazolam   Airway Management Planned: Natural Airway and Nasal Cannula  Additional Equipment: None  Intra-op Plan:   Post-operative Plan:   Informed Consent: I have reviewed the patients History and Physical, chart, labs and discussed the procedure including the risks, benefits and alternatives for the proposed anesthesia with the patient or authorized representative who has indicated his/her understanding and acceptance.     Dental advisory given and Interpreter used for  interveiw  Plan Discussed with: CRNA and Anesthesiologist  Anesthesia Plan Comments:          Anesthesia Quick Evaluation

## 2024-04-22 ENCOUNTER — Observation Stay (HOSPITAL_COMMUNITY)

## 2024-04-22 ENCOUNTER — Encounter (HOSPITAL_COMMUNITY)
Admission: RE | Disposition: A | Discharge status: Home / Self Care | Payer: Self-pay | Source: Ambulatory Visit | Attending: Orthopaedic Surgery

## 2024-04-22 ENCOUNTER — Other Ambulatory Visit: Payer: Self-pay | Admitting: Physician Assistant

## 2024-04-22 ENCOUNTER — Encounter: Payer: Self-pay | Admitting: Nurse Practitioner

## 2024-04-22 ENCOUNTER — Other Ambulatory Visit (HOSPITAL_COMMUNITY): Payer: Self-pay

## 2024-04-22 ENCOUNTER — Ambulatory Visit (HOSPITAL_COMMUNITY): Admitting: Registered Nurse

## 2024-04-22 ENCOUNTER — Observation Stay (HOSPITAL_COMMUNITY)
Admission: RE | Admit: 2024-04-22 | Discharge: 2024-04-24 | Disposition: A | Discharge status: Home / Self Care | Source: Ambulatory Visit | Attending: Orthopaedic Surgery | Admitting: Orthopaedic Surgery

## 2024-04-22 ENCOUNTER — Other Ambulatory Visit: Payer: Self-pay

## 2024-04-22 DIAGNOSIS — Z7901 Long term (current) use of anticoagulants: Secondary | ICD-10-CM | POA: Insufficient documentation

## 2024-04-22 DIAGNOSIS — M1712 Unilateral primary osteoarthritis, left knee: Principal | ICD-10-CM | POA: Insufficient documentation

## 2024-04-22 DIAGNOSIS — F418 Other specified anxiety disorders: Secondary | ICD-10-CM | POA: Diagnosis not present

## 2024-04-22 DIAGNOSIS — Z96652 Presence of left artificial knee joint: Secondary | ICD-10-CM | POA: Insufficient documentation

## 2024-04-22 HISTORY — PX: TOTAL KNEE ARTHROPLASTY: SHX125

## 2024-04-22 SURGERY — ARTHROPLASTY, KNEE, TOTAL
Anesthesia: Regional | Site: Knee | Laterality: Left

## 2024-04-22 MED ORDER — EPHEDRINE SULFATE-NACL 50-0.9 MG/10ML-% IV SOSY
PREFILLED_SYRINGE | INTRAVENOUS | Status: DC | PRN
  Administered 2024-04-22: 5 mg via INTRAVENOUS
  Administered 2024-04-22: 10 mg via INTRAVENOUS

## 2024-04-22 MED ORDER — OXYCODONE HCL 5 MG PO TABS
ORAL_TABLET | ORAL | Status: AC
  Filled 2024-04-22: qty 1

## 2024-04-22 MED ORDER — PROPOFOL 10 MG/ML IV BOLUS
INTRAVENOUS | Status: AC
  Filled 2024-04-22: qty 20

## 2024-04-22 MED ORDER — METOCLOPRAMIDE HCL 5 MG/ML IJ SOLN: 5.0000 mg | Freq: Three times a day (TID) | INTRAMUSCULAR | Status: DC | PRN

## 2024-04-22 MED ORDER — CEFAZOLIN SODIUM-DEXTROSE 2-4 GM/100ML-% IV SOLN
INTRAVENOUS | Status: AC
  Filled 2024-04-22: qty 100

## 2024-04-22 MED ORDER — LIDOCAINE 2% (20 MG/ML) 5 ML SYRINGE
INTRAMUSCULAR | Status: DC | PRN
  Administered 2024-04-22: 20 mg via INTRAVENOUS

## 2024-04-22 MED ORDER — CELECOXIB 200 MG PO CAPS
200.0000 mg | ORAL_CAPSULE | Freq: Once | ORAL | Status: AC
  Administered 2024-04-22: 200 mg via ORAL
  Filled 2024-04-22: qty 1

## 2024-04-22 MED ORDER — CHLORHEXIDINE GLUCONATE 0.12 % MT SOLN
15.0000 mL | Freq: Once | OROMUCOSAL | Status: AC
  Administered 2024-04-22: 15 mL via OROMUCOSAL
  Filled 2024-04-22: qty 15

## 2024-04-22 MED ORDER — OXYCODONE HCL 5 MG PO TABS
10.0000 mg | ORAL_TABLET | ORAL | Status: DC | PRN
  Administered 2024-04-23 (×2): 10 mg via ORAL
  Filled 2024-04-22: qty 2

## 2024-04-22 MED ORDER — METOCLOPRAMIDE HCL 5 MG PO TABS: 5.0000 mg | ORAL_TABLET | Freq: Three times a day (TID) | ORAL | Status: DC | PRN

## 2024-04-22 MED ORDER — PROPOFOL 500 MG/50ML IV EMUL
INTRAVENOUS | Status: DC | PRN
  Administered 2024-04-22 (×3): 10 mg via INTRAVENOUS
  Administered 2024-04-22: 75 ug/kg/min via INTRAVENOUS

## 2024-04-22 MED ORDER — ALBUMIN HUMAN 5 % IV SOLN: INTRAVENOUS | Status: DC | PRN

## 2024-04-22 MED ORDER — FENTANYL CITRATE (PF) 100 MCG/2ML IJ SOLN
INTRAMUSCULAR | Status: AC
  Filled 2024-04-22: qty 2

## 2024-04-22 MED ORDER — DOXYCYCLINE HYCLATE 100 MG PO TABS
100.0000 mg | ORAL_TABLET | Freq: Two times a day (BID) | ORAL | 0 refills | Status: DC
  Filled 2024-04-22: qty 20, 10d supply, fill #0

## 2024-04-22 MED ORDER — TRAZODONE HCL 100 MG PO TABS
100.0000 mg | ORAL_TABLET | Freq: Every day | ORAL | Status: DC
  Administered 2024-04-22 – 2024-04-23 (×2): 100 mg via ORAL
  Filled 2024-04-22 (×2): qty 1

## 2024-04-22 MED ORDER — OXYCODONE HCL ER 10 MG PO T12A
10.0000 mg | EXTENDED_RELEASE_TABLET | Freq: Two times a day (BID) | ORAL | Status: DC
  Administered 2024-04-22 – 2024-04-24 (×4): 10 mg via ORAL
  Filled 2024-04-22 (×4): qty 1

## 2024-04-22 MED ORDER — ACETAMINOPHEN 325 MG PO TABS
325.0000 mg | ORAL_TABLET | Freq: Four times a day (QID) | ORAL | Status: DC | PRN
  Administered 2024-04-24: 650 mg via ORAL
  Filled 2024-04-22: qty 2

## 2024-04-22 MED ORDER — METHOCARBAMOL 1000 MG/10ML IJ SOLN
500.0000 mg | Freq: Four times a day (QID) | INTRAMUSCULAR | Status: DC | PRN
  Administered 2024-04-22: 500 mg via INTRAVENOUS

## 2024-04-22 MED ORDER — METHOCARBAMOL 500 MG PO TABS
500.0000 mg | ORAL_TABLET | Freq: Four times a day (QID) | ORAL | Status: DC | PRN
  Administered 2024-04-23: 500 mg via ORAL
  Filled 2024-04-22: qty 1

## 2024-04-22 MED ORDER — TRANEXAMIC ACID 1000 MG/10ML IV SOLN
INTRAVENOUS | Status: DC | PRN
  Administered 2024-04-22: 2000 mg via TOPICAL

## 2024-04-22 MED ORDER — APIXABAN 2.5 MG PO TABS
2.5000 mg | ORAL_TABLET | Freq: Two times a day (BID) | ORAL | 0 refills | Status: DC
  Filled 2024-04-22: qty 60, 30d supply, fill #0

## 2024-04-22 MED ORDER — LIDOCAINE 2% (20 MG/ML) 5 ML SYRINGE
INTRAMUSCULAR | Status: AC
  Filled 2024-04-22: qty 5

## 2024-04-22 MED ORDER — DEXAMETHASONE SODIUM PHOSPHATE 10 MG/ML IJ SOLN
INTRAMUSCULAR | Status: DC | PRN
  Administered 2024-04-22: 5 mg via INTRAVENOUS

## 2024-04-22 MED ORDER — FENTANYL CITRATE (PF) 100 MCG/2ML IJ SOLN: 100.0000 ug | Freq: Once | INTRAMUSCULAR | Status: AC

## 2024-04-22 MED ORDER — AMITRIPTYLINE HCL 50 MG PO TABS
25.0000 mg | ORAL_TABLET | Freq: Every day | ORAL | Status: DC
  Administered 2024-04-22 – 2024-04-23 (×2): 25 mg via ORAL
  Filled 2024-04-22 (×2): qty 1

## 2024-04-22 MED ORDER — MEPERIDINE HCL 25 MG/ML IJ SOLN: 6.2500 mg | INTRAMUSCULAR | Status: DC | PRN

## 2024-04-22 MED ORDER — BUPIVACAINE-EPINEPHRINE (PF) 0.5% -1:200000 IJ SOLN
INTRAMUSCULAR | Status: DC | PRN
Start: 2024-04-22 — End: 2024-04-22
  Administered 2024-04-22: 20 mL via PERINEURAL

## 2024-04-22 MED ORDER — LACTATED RINGERS IV SOLN: INTRAVENOUS | Status: DC

## 2024-04-22 MED ORDER — 0.9 % SODIUM CHLORIDE (POUR BTL) OPTIME
TOPICAL | Status: DC | PRN
Start: 2024-04-22 — End: 2024-04-22
  Administered 2024-04-22: 1000 mL

## 2024-04-22 MED ORDER — KETOROLAC TROMETHAMINE 15 MG/ML IJ SOLN
7.5000 mg | Freq: Four times a day (QID) | INTRAMUSCULAR | Status: AC
  Administered 2024-04-22 – 2024-04-23 (×4): 7.5 mg via INTRAVENOUS
  Filled 2024-04-22 (×3): qty 1

## 2024-04-22 MED ORDER — FENTANYL CITRATE (PF) 100 MCG/2ML IJ SOLN
25.0000 ug | INTRAMUSCULAR | Status: DC | PRN
  Administered 2024-04-22 (×3): 25 ug via INTRAVENOUS

## 2024-04-22 MED ORDER — METHOCARBAMOL 1000 MG/10ML IJ SOLN
INTRAMUSCULAR | Status: AC
  Filled 2024-04-22: qty 10

## 2024-04-22 MED ORDER — PRONTOSAN WOUND IRRIGATION OPTIME
TOPICAL | Status: DC | PRN
Start: 2024-04-22 — End: 2024-04-22
  Administered 2024-04-22: 350 mL

## 2024-04-22 MED ORDER — DOCUSATE SODIUM 100 MG PO CAPS
100.0000 mg | ORAL_CAPSULE | Freq: Two times a day (BID) | ORAL | Status: DC
  Administered 2024-04-22 – 2024-04-24 (×3): 100 mg via ORAL
  Filled 2024-04-22 (×4): qty 1

## 2024-04-22 MED ORDER — VANCOMYCIN HCL 1000 MG IV SOLR
INTRAVENOUS | Status: DC | PRN
  Administered 2024-04-22: 1000 mg via TOPICAL

## 2024-04-22 MED ORDER — HYDROMORPHONE HCL 1 MG/ML IJ SOLN: 0.5000 mg | INTRAMUSCULAR | Status: DC | PRN

## 2024-04-22 MED ORDER — MENTHOL 3 MG MT LOZG: 1.0000 | LOZENGE | OROMUCOSAL | Status: DC | PRN

## 2024-04-22 MED ORDER — SODIUM CHLORIDE 0.9 % IR SOLN
Status: DC | PRN
  Administered 2024-04-22: 1000 mL

## 2024-04-22 MED ORDER — ACETAMINOPHEN 500 MG PO TABS
1000.0000 mg | ORAL_TABLET | Freq: Four times a day (QID) | ORAL | Status: AC
  Administered 2024-04-22 – 2024-04-23 (×4): 1000 mg via ORAL
  Filled 2024-04-22 (×4): qty 2

## 2024-04-22 MED ORDER — CEFAZOLIN SODIUM-DEXTROSE 2-4 GM/100ML-% IV SOLN
2.0000 g | Freq: Four times a day (QID) | INTRAVENOUS | Status: AC
  Administered 2024-04-22 (×2): 2 g via INTRAVENOUS
  Filled 2024-04-22: qty 100

## 2024-04-22 MED ORDER — ONDANSETRON HCL 4 MG/2ML IJ SOLN
INTRAMUSCULAR | Status: AC
  Filled 2024-04-22: qty 2

## 2024-04-22 MED ORDER — ONDANSETRON HCL 4 MG PO TABS: 4.0000 mg | ORAL_TABLET | Freq: Four times a day (QID) | ORAL | Status: DC | PRN

## 2024-04-22 MED ORDER — FENTANYL CITRATE (PF) 100 MCG/2ML IJ SOLN
INTRAMUSCULAR | Status: AC
  Administered 2024-04-22: 100 ug via INTRAVENOUS
  Filled 2024-04-22: qty 2

## 2024-04-22 MED ORDER — PHENYLEPHRINE HCL-NACL 20-0.9 MG/250ML-% IV SOLN
INTRAVENOUS | Status: DC | PRN
  Administered 2024-04-22: 40 ug/min via INTRAVENOUS

## 2024-04-22 MED ORDER — ORAL CARE MOUTH RINSE: 15.0000 mL | Freq: Once | OROMUCOSAL | Status: AC

## 2024-04-22 MED ORDER — BUPIVACAINE-MELOXICAM ER 400-12 MG/14ML IJ SOLN
INTRAMUSCULAR | Status: AC
  Filled 2024-04-22: qty 1

## 2024-04-22 MED ORDER — CEFAZOLIN SODIUM-DEXTROSE 2-4 GM/100ML-% IV SOLN
2.0000 g | INTRAVENOUS | Status: AC
  Administered 2024-04-22: 2 g via INTRAVENOUS
  Filled 2024-04-22: qty 100

## 2024-04-22 MED ORDER — ACETAMINOPHEN 500 MG PO TABS
1000.0000 mg | ORAL_TABLET | Freq: Once | ORAL | Status: AC
  Administered 2024-04-22: 1000 mg via ORAL
  Filled 2024-04-22: qty 2

## 2024-04-22 MED ORDER — KETOROLAC TROMETHAMINE 15 MG/ML IJ SOLN
INTRAMUSCULAR | Status: AC
  Filled 2024-04-22: qty 1

## 2024-04-22 MED ORDER — APIXABAN 2.5 MG PO TABS
2.5000 mg | ORAL_TABLET | Freq: Two times a day (BID) | ORAL | Status: DC
  Administered 2024-04-23 – 2024-04-24 (×3): 2.5 mg via ORAL
  Filled 2024-04-22 (×3): qty 1

## 2024-04-22 MED ORDER — PHENOL 1.4 % MT LIQD: 1.0000 | OROMUCOSAL | Status: DC | PRN

## 2024-04-22 MED ORDER — PHENYLEPHRINE 80 MCG/ML (10ML) SYRINGE FOR IV PUSH (FOR BLOOD PRESSURE SUPPORT)
PREFILLED_SYRINGE | INTRAVENOUS | Status: DC | PRN
  Administered 2024-04-22 (×2): 80 ug via INTRAVENOUS
  Administered 2024-04-22 (×3): 160 ug via INTRAVENOUS
  Administered 2024-04-22 (×2): 80 ug via INTRAVENOUS

## 2024-04-22 MED ORDER — BUPIVACAINE IN DEXTROSE 0.75-8.25 % IT SOLN
INTRATHECAL | Status: DC | PRN
Start: 2024-04-22 — End: 2024-04-22
  Administered 2024-04-22: 1.8 mL via INTRATHECAL

## 2024-04-22 MED ORDER — BUPIVACAINE-MELOXICAM ER 400-12 MG/14ML IJ SOLN
INTRAMUSCULAR | Status: DC | PRN
  Administered 2024-04-22: 400 mg

## 2024-04-22 MED ORDER — TRANEXAMIC ACID-NACL 1000-0.7 MG/100ML-% IV SOLN
1000.0000 mg | INTRAVENOUS | Status: AC
  Administered 2024-04-22: 1000 mg via INTRAVENOUS
  Filled 2024-04-22: qty 100

## 2024-04-22 MED ORDER — OXYCODONE HCL 5 MG/5ML PO SOLN: 5.0000 mg | Freq: Once | ORAL | Status: AC | PRN

## 2024-04-22 MED ORDER — TRANEXAMIC ACID-NACL 1000-0.7 MG/100ML-% IV SOLN
1000.0000 mg | Freq: Once | INTRAVENOUS | Status: AC
  Administered 2024-04-22: 1000 mg via INTRAVENOUS

## 2024-04-22 MED ORDER — TRANEXAMIC ACID-NACL 1000-0.7 MG/100ML-% IV SOLN
INTRAVENOUS | Status: AC
  Filled 2024-04-22: qty 100

## 2024-04-22 MED ORDER — POVIDONE-IODINE 10 % EX SWAB
2.0000 | Freq: Once | CUTANEOUS | Status: AC
  Administered 2024-04-22: 2 via TOPICAL

## 2024-04-22 MED ORDER — OXYCODONE HCL 5 MG PO TABS
5.0000 mg | ORAL_TABLET | ORAL | Status: DC | PRN
  Administered 2024-04-24: 5 mg via ORAL
  Filled 2024-04-22: qty 2
  Filled 2024-04-22: qty 1

## 2024-04-22 MED ORDER — DEXAMETHASONE SODIUM PHOSPHATE 10 MG/ML IJ SOLN
INTRAMUSCULAR | Status: AC
  Filled 2024-04-22: qty 1

## 2024-04-22 MED ORDER — OXYCODONE HCL 5 MG PO TABS
5.0000 mg | ORAL_TABLET | Freq: Once | ORAL | Status: AC | PRN
  Administered 2024-04-22: 5 mg via ORAL

## 2024-04-22 MED ORDER — FLUOXETINE HCL 20 MG PO CAPS
40.0000 mg | ORAL_CAPSULE | Freq: Every morning | ORAL | Status: DC
  Administered 2024-04-23 – 2024-04-24 (×2): 40 mg via ORAL
  Filled 2024-04-22 (×2): qty 2

## 2024-04-22 MED ORDER — GABAPENTIN 300 MG PO CAPS
300.0000 mg | ORAL_CAPSULE | Freq: Every day | ORAL | Status: DC
  Administered 2024-04-22 – 2024-04-24 (×3): 300 mg via ORAL
  Filled 2024-04-22 (×3): qty 1

## 2024-04-22 MED ORDER — PHENYLEPHRINE 80 MCG/ML (10ML) SYRINGE FOR IV PUSH (FOR BLOOD PRESSURE SUPPORT)
PREFILLED_SYRINGE | INTRAVENOUS | Status: AC
  Filled 2024-04-22: qty 10

## 2024-04-22 MED ORDER — DEXAMETHASONE SODIUM PHOSPHATE 10 MG/ML IJ SOLN
10.0000 mg | Freq: Once | INTRAMUSCULAR | Status: AC
  Administered 2024-04-23: 10 mg via INTRAVENOUS
  Filled 2024-04-22: qty 1

## 2024-04-22 MED ORDER — VANCOMYCIN HCL 1000 MG IV SOLR
INTRAVENOUS | Status: AC
  Filled 2024-04-22: qty 20

## 2024-04-22 MED ORDER — ONDANSETRON HCL 4 MG/2ML IJ SOLN: 4.0000 mg | Freq: Four times a day (QID) | INTRAMUSCULAR | Status: DC | PRN

## 2024-04-22 MED ORDER — ONDANSETRON HCL 4 MG/2ML IJ SOLN
INTRAMUSCULAR | Status: DC | PRN
  Administered 2024-04-22: 4 mg via INTRAVENOUS

## 2024-04-22 MED ORDER — ONDANSETRON HCL 4 MG/2ML IJ SOLN: 4.0000 mg | Freq: Once | INTRAMUSCULAR | Status: DC | PRN

## 2024-04-22 SURGICAL SUPPLY — 72 items
ALCOHOL 70% 16 OZ (MISCELLANEOUS) ×1 IMPLANT
BAG DECANTER FOR FLEXI CONT (MISCELLANEOUS) ×1 IMPLANT
BANDAGE ESMARK 6X9 LF (GAUZE/BANDAGES/DRESSINGS) IMPLANT
BLADE SAG 18X100X1.27 (BLADE) ×1 IMPLANT
BLADE SAW SAG 90X13X1.27 (BLADE) IMPLANT
BLADE SAW SGTL 73X25 THK (BLADE) ×1 IMPLANT
BNDG ELASTIC 6INX 5YD STR LF (GAUZE/BANDAGES/DRESSINGS) IMPLANT
BOWL SMART MIX CTS (DISPOSABLE) ×1 IMPLANT
CANISTER WOUNDNEG PRESSURE 500 (CANNISTER) IMPLANT
CEMENT BONE REFOBACIN R1X40 US (Cement) IMPLANT
COMPONENT FEM CMT PERS SZ4 LT (Joint) IMPLANT
COOLER ICEMAN CLASSIC (MISCELLANEOUS) ×1 IMPLANT
COVER SURGICAL LIGHT HANDLE (MISCELLANEOUS) ×1 IMPLANT
CUFF TRNQT CYL 34X4.125X (TOURNIQUET CUFF) ×1 IMPLANT
DRAPE EXTREMITY T 121X128X90 (DISPOSABLE) ×1 IMPLANT
DRAPE HALF SHEET 40X57 (DRAPES) ×1 IMPLANT
DRAPE INCISE IOBAN 66X45 STRL (DRAPES) ×1 IMPLANT
DRAPE POUCH INSTRU U-SHP 10X18 (DRAPES) ×1 IMPLANT
DRAPE SURG ORHT 6 SPLT 77X108 (DRAPES) IMPLANT
DRAPE U-SHAPE 47X51 STRL (DRAPES) ×2 IMPLANT
DRESSING PEEL AND PLAC PRVNA20 (GAUZE/BANDAGES/DRESSINGS) IMPLANT
DRIVER FEM NEXGEN T-HANDLE 2 (ORTHOPEDIC DISPOSABLE SUPPLIES) IMPLANT
DURAPREP 26ML APPLICATOR (WOUND CARE) ×3 IMPLANT
ELECT CAUTERY BLADE 6.4 (BLADE) ×1 IMPLANT
ELECT PENCIL ROCKER SW 15FT (MISCELLANEOUS) ×1 IMPLANT
ELECTRODE REM PT RTRN 9FT ADLT (ELECTROSURGICAL) ×1 IMPLANT
GLOVE BIOGEL PI IND STRL 7.0 (GLOVE) ×2 IMPLANT
GLOVE BIOGEL PI IND STRL 7.5 (GLOVE) ×5 IMPLANT
GLOVE ECLIPSE 7.0 STRL STRAW (GLOVE) ×3 IMPLANT
GLOVE INDICATOR 7.0 STRL GRN (GLOVE) ×1 IMPLANT
GLOVE INDICATOR 7.5 STRL GRN (GLOVE) ×1 IMPLANT
GLOVE SURG SYN 7.5 E (GLOVE) ×2 IMPLANT
GLOVE SURG SYN 7.5 PF PI (GLOVE) ×2 IMPLANT
GLOVE SURG UNDER LTX SZ7.5 (GLOVE) ×2 IMPLANT
GLOVE SURG UNDER POLY LF SZ7 (GLOVE) ×2 IMPLANT
GOWN STRL REUS W/ TWL LRG LVL3 (GOWN DISPOSABLE) ×1 IMPLANT
GOWN STRL SURGICAL XL XLNG (GOWN DISPOSABLE) ×1 IMPLANT
GOWN TOGA ZIPPER T7+ PEEL AWAY (MISCELLANEOUS) ×2 IMPLANT
HOOD PEEL AWAY T7 (MISCELLANEOUS) ×1 IMPLANT
INSERT ARTISURF PERS SZ 4-5 LT (Insert) IMPLANT
INSERT PAT PSN TIB 26M 7.5 THK (Miscellaneous) IMPLANT
KIT BASIN OR (CUSTOM PROCEDURE TRAY) ×1 IMPLANT
KIT TURNOVER KIT B (KITS) ×1 IMPLANT
MANIFOLD NEPTUNE II (INSTRUMENTS) ×1 IMPLANT
MARKER SKIN DUAL TIP RULER LAB (MISCELLANEOUS) ×2 IMPLANT
NDL SPNL 18GX3.5 QUINCKE PK (NEEDLE) ×1 IMPLANT
NEEDLE SPNL 18GX3.5 QUINCKE PK (NEEDLE) ×1 IMPLANT
NS IRRIG 1000ML POUR BTL (IV SOLUTION) ×1 IMPLANT
PACK TOTAL JOINT (CUSTOM PROCEDURE TRAY) ×1 IMPLANT
PAD ARMBOARD POSITIONER FOAM (MISCELLANEOUS) ×2 IMPLANT
PAD COLD SHLDR WRAP-ON (PAD) ×1 IMPLANT
PIN DRILL HDLS TROCAR 75 4PK (PIN) IMPLANT
SCREW FEMALE HEX FIX 25X2.5 (ORTHOPEDIC DISPOSABLE SUPPLIES) IMPLANT
SET HNDPC FAN SPRY TIP SCT (DISPOSABLE) ×1 IMPLANT
SOLUTION PRONTOSAN WOUND 350ML (IRRIGATION / IRRIGATOR) ×1 IMPLANT
STAPLER SKIN PROX 35W (STAPLE) IMPLANT
STEM TIB ST PERS 14+30 (Stem) IMPLANT
STEM TIBIA 5 DEG SZ D L KNEE (Knees) IMPLANT
SUCTION TUBE FRAZIER 10FR DISP (SUCTIONS) ×1 IMPLANT
SUT ETHILON 2 0 FS 18 (SUTURE) IMPLANT
SUT MNCRL AB 3-0 PS2 27 (SUTURE) IMPLANT
SUT STRATAFIX PDS+ 0 24IN (SUTURE) IMPLANT
SUT VIC AB 0 CT1 27XBRD ANBCTR (SUTURE) ×2 IMPLANT
SUT VIC AB 1 CTX 27 (SUTURE) ×3 IMPLANT
SUT VIC AB 2-0 CT1 TAPERPNT 27 (SUTURE) ×4 IMPLANT
SYR 50ML LL SCALE MARK (SYRINGE) ×2 IMPLANT
TOWEL GREEN STERILE (TOWEL DISPOSABLE) ×1 IMPLANT
TOWEL GREEN STERILE FF (TOWEL DISPOSABLE) ×1 IMPLANT
TRAY CATH INTERMITTENT SS 16FR (CATHETERS) IMPLANT
TUBE SUCT ARGYLE STRL (TUBING) ×1 IMPLANT
UNDERPAD 30X36 HEAVY ABSORB (UNDERPADS AND DIAPERS) ×1 IMPLANT
YANKAUER SUCT BULB TIP NO VENT (SUCTIONS) ×2 IMPLANT

## 2024-04-22 NOTE — Evaluation (Signed)
 Physical Therapy Evaluation Patient Details Name: Amy Roman MRN: [id number] DOB: [birth year] Today's Date: 04/22/2024  History of Present Illness  Pt is 73 year old presented [facility]  04/22/24 for lt TKR. PMH - breast   CA, rt TKR, peripheral neuropathy, arthritis, obesity, anxiety, depression  Clinical Impression  Pt presents to PT s/p lt TKR. Today she was unable to come to standing with 1 person assist. Expect she will do better tomorrow. She lives alone and has some aides but if she goes straight home she is going to need more assistance. Will continue to work toward incr mobility and independence.         If plan is discharge home, recommend the following: A lot of help with walking and/or transfers;A lot of help with bathing/dressing/bathroom;Assist for transportation;Assistance with cooking/housework;Help with stairs or ramp for entrance   Can travel by private vehicle        Equipment Recommendations None recommended by PT  Recommendations for Other Services       Functional Status Assessment Patient has had a recent decline in their functional status and demonstrates the ability to make significant improvements in function in a reasonable and predictable amount of time.     Precautions / Restrictions Precautions Precautions: Fall;Knee Restrictions Weight Bearing Restrictions Per Provider Order: Yes LLE Weight Bearing Per Provider Order: Weight bearing as tolerated      Mobility  Bed Mobility Overal bed mobility: Needs Assistance Bed Mobility: Supine to Sit, Sit to Supine     Supine to sit: Mod assist, HOB elevated Sit to supine: Mod assist   General bed mobility comments: Assist to bring legs off of bed and bring hips to EOB. Assist to bring LE's back up into bed.    Transfers                   General transfer comment: Attempted to stand from EOB but unable to fully stand with 1 person assist.    Ambulation/Gait                   Stairs            Wheelchair Mobility     Tilt Bed    Modified Rankin (Stroke Patients Only)       Balance Overall balance assessment: Needs assistance Sitting-balance support: No upper extremity supported, Feet supported Sitting balance-Leahy Scale: Good                                       Pertinent Vitals/Pain Pain Assessment Pain Assessment: Faces Faces Pain Scale: Hurts little more Pain Location: lt knee Pain Descriptors / Indicators: Grimacing, Guarding Pain Intervention(s): Limited activity within patient's tolerance, Monitored during session, Repositioned, Ice applied    Home Living Family/patient expects to be discharged to:: Private residence Living Arrangements: Alone Available Help at Discharge: Family;Available PRN/intermittently;Personal care attendant Type of Home: House Home Access: Stairs to enter Entrance Stairs-Rails: Left Entrance Stairs-Number of Steps: 4   Home Layout: One level Home Equipment: Agricultural consultant (2 wheels)      Prior Function Prior Level of Function : Needs assist             Mobility Comments: amb with rolling walker       Extremity/Trunk Assessment   Upper Extremity Assessment Upper Extremity Assessment: Defer to OT evaluation    Lower Extremity Assessment  Lower Extremity Assessment: Generalized weakness;LLE deficits/detail LLE Deficits / Details: Limited by post op pain and body habitus    Cervical / Trunk Assessment Cervical / Trunk Assessment: Other exceptions (body habitus)  Communication   Communication Communication: Other (comment) (English is her second language but she is functional with [place].)    Cognition Arousal: Alert Behavior During Therapy: WFL for tasks assessed/performed   PT - Cognitive impairments: No apparent impairments                         Following commands: Intact       Cueing Cueing Techniques: Verbal cues, Tactile cues     General  Comments      Exercises Total Joint Exercises Quad Sets: AROM, Left, 5 reps   Assessment/Plan    PT Assessment Patient needs continued PT services  PT Problem List         PT Treatment Interventions DME instruction;Gait training;Stair training;Functional mobility training;Therapeutic activities;Therapeutic exercise;Balance training;Patient/family education    PT Goals (Current goals can be found in the Care Plan section)  Acute Rehab PT Goals Patient Stated Goal: go home PT Goal Formulation: With patient Time For Goal Achievement: 05/06/24 Potential to Achieve Goals: Good    Frequency 7X/week     Co-evaluation               AM-PAC PT 6 Clicks Mobility  Outcome Measure Help needed turning from your back to your side while in a flat bed without using bedrails?: A Little Help needed moving from lying on your back to sitting on the side of a flat bed without using bedrails?: A Lot Help needed moving to and from a bed to a chair (including a wheelchair)?: Total Help needed standing up from a chair using your arms (e.g., wheelchair or bedside chair)?: Total Help needed to walk in hospital room?: Total Help needed climbing 3-5 steps with a railing? : Total 6 Click Score: 9    End of Session   Activity Tolerance: Patient tolerated treatment well Patient left: in bed;with call bell/phone within reach;with family/visitor present Nurse Communication: Mobility status PT Visit Diagnosis: Other abnormalities of gait and mobility (R26.89);Muscle weakness (generalized) (M62.81);Difficulty in walking, not elsewhere classified (R26.2);Pain Pain - Right/Left: Left Pain - part of body: Knee    Time: 1720-1751 PT Time Calculation (min) (ACUTE ONLY): 31 min   Charges:   PT Evaluation $PT Eval Moderate Complexity: 1 Mod PT Treatments $Therapeutic Activity: 8-22 mins PT General Charges $$ ACUTE PT VISIT: 1 Visit         [facility] PT Acute Rehabilitation  Services Office [phone]   Rodgers ORN [facility] 04/22/2024, 6:12 PM

## 2024-04-22 NOTE — Discharge Instructions (Signed)

## 2024-04-22 NOTE — Op Note (Signed)
 Total Knee Arthroplasty Procedure Note  Preoperative diagnosis: Left knee osteoarthritis  Postoperative diagnosis:same  Operative findings: Complete loss articular cartilage from all surfaces Varus deformity Osteoporotic bone Lymphedema  Operative procedure:  Left total knee arthroplasty. CPT [phone] Application of incisional VAC  Surgeon: N. Ozell Cummins, MD  Assist: Ronal Morna Grave, PA-C; necessary for the timely completion of procedure and due to complexity of procedure.  Anesthesia: Spinal, regional, local  Tourniquet time: see anesthesia record  Implants used: Zimmer persona Femur: CR 4 Tibia: D with 30 mm cemented stem Patella: 26 mm Polyethylene: 14 mm medial congruent  Indication: Amy Roman is a 73 y.o. year old female with a history of knee pain. Having failed conservative management, the patient elected to proceed with a total knee arthroplasty.  We have reviewed the risk and benefits of the surgery and they elected to proceed after voicing understanding.  Procedure:  After informed consent was obtained and understanding of the risk were voiced including but not limited to bleeding, infection, damage to surrounding structures including nerves and vessels, blood clots, leg length inequality and the failure to achieve desired results, the operative extremity was marked with verbal confirmation of the patient in the holding area.   The patient was then brought to the operating room and transported to the operating room table in the supine position.  A tourniquet was applied to the operative extremity around the upper thigh. The operative limb was then prepped and draped in the usual sterile fashion and preoperative antibiotics were administered.  A time out was performed prior to the start of surgery confirming the correct extremity, preoperative antibiotic administration, as well as team members, implants and instruments available for the case. Correct  surgical site was also confirmed with preoperative radiographs. The limb was then elevated for exsanguination and the tourniquet was inflated. A midline incision was made and a standard medial parapatellar approach was performed.  The infrapatellar fat pad was removed.  Suprapatellar synovium was removed to reveal the anterior distal femoral cortex.  A medial peel was performed to release the capsule and the deep MCL off of the medial tibial plateau back to the semimembranosus.  The patella was then everted which showed complete loss of articular cartilage and was resected down to 12 mm and sized to a 26 mm.  A cover was placed on the patella for protection from retractors.  The knee was then brought into flexion and we then turned our attention to the femur.  The ACL was sacrificed.  Start site was drilled in the femur and the intramedullary distal femoral cutting guide was placed, set at 3 degrees valgus due to severe varus deformity, taking 10 mm of distal resection. The distal cut was made. Osteophytes were then removed.  Next, the proximal tibial cutting guide was placed with appropriate slope, varus/valgus alignment and depth of resection.  The drop rod was attached to confirm that it was aimed at the second metatarsal.  The proximal tibial cut was made taking 2 mm off the low side. Gap blocks were then used to assess the extension gap and alignment, and appropriate soft tissue releases were performed. Attention was turned back to the femur, which was sized using the sizing guide to a size 4. Appropriate rotation of the femoral component was determined using epicondylar axis, Whiteside's line, and assessing the flexion gap under ligament tension. The appropriate size 4-in-1 cutting block was placed and checked with an angel wing and cuts were made.  Posterior femoral osteophytes and uncapped bone were then removed with the curved osteotome.  The menisci were removed.  Trial components were placed, and  stability was checked in full extension, mid-flexion, and deep flexion.  PCL was resected to balance the flexion space. Proper tibial rotation was determined and marked.  The patella tracked well without a lateral release.  The femoral lugs were then drilled. Trial components were then removed and tibial preparation performed.  The trial tibia was pointed to the medial third of the tibial tubercle.  The tibia was sized for a size D component and prepared.  Trial components were removed.    The bony surfaces were irrigated with a pulse lavage and then dried. Bone cement was vacuum mixed on the back table, and the final components sized above were cemented into place.  Antibiotic irrigation was placed in the knee joint and soft tissues while the cement cured.  After cement had finished curing, excess cement was removed. The stability of the construct was re-evaluated throughout a range of motion and found to be acceptable. The trial liner was removed, the knee was copiously irrigated, and the knee was re-evaluated for any excess bone debris. The real polyethylene liner, 14 mm thick, was inserted and checked to ensure the locking mechanism had engaged appropriately. The tourniquet was deflated and hemostasis was achieved. The wound was irrigated with normal saline.  One gram of vancomycin  powder was placed in the surgical bed.  Topical mixture of 0.25% bupivacaine  and meloxicam was placed in the joint for postoperative pain.  Capsular closure was performed with a #1 stratafix in flexion, subcutaneous fat closed with a 0 vicryl suture, then subcutaneous tissue closed with interrupted 2.0 vicryl suture. The skin was then closed with a 2.0 nylon and incisional VAC due to increased adipose tissue and lymphedema. A sterile dressing was applied.  The patient was awakened in the operating room and taken to recovery in stable condition. All sponge, needle, and instrument counts were correct at the end of the case.  Morna Grave was necessary for opening, closing, retracting, limb positioning and overall facilitation and completion of the surgery.  Position: supine  Complications: none.  Time Out: performed   Drains/Packing: none Estimated blood loss: minimal Returned to Recovery Room: in good condition.   Mechanical VTE (DVT) Prophylaxis: sequential compression devices, TED thigh-high  Chemical VTE (DVT) Prophylaxis: eliquis  Fluid Replacement  Crystalloid: see anesthesia record Blood: none  FFP: none   Specimens Removed: 1 to pathology  Sponge and Instrument Count Correct? yes  PACU: portable radiograph - knee AP and Lateral  Plan/RTC: Return in 2 weeks for suture removal.  Weight Bearing/Load Lower Extremity: full   Implant Name Type Inv. Item Serial No. Manufacturer Lot No. LRB No. Used Action  CEMENT BONE REFOBACIN [id number] US  - [id number] Cement CEMENT BONE REFOBACIN [id number] US   ZIMMER RECON(ORTH,TRAU,BIO,SG) [id number] Left 3 Implanted  COMPONENT FEM CMT PERS SZ4 LT - [id number] Joint COMPONENT FEM CMT PERS SZ4 LT  ZIMMER RECON(ORTH,TRAU,BIO,SG) [id number] Left 1 Implanted  INSERT PAT PSN TIB 65M 7.5 THK - [id number] Miscellaneous INSERT PAT PSN TIB 65M 7.5 THK  ZIMMER RECON(ORTH,TRAU,BIO,SG) [id number] Left 1 Implanted    N. Ozell Cummins, MD [facility] 10:45 AM

## 2024-04-22 NOTE — Anesthesia Procedure Notes (Signed)
 Anesthesia Regional Block: Adductor canal block   Pre-Anesthetic Checklist: , timeout performed,  Correct Patient, Correct Site, Correct Laterality,  Correct Procedure, Correct Position, site marked,  Risks and benefits discussed,  Surgical consent,  Pre-op evaluation,  At surgeon's request and post-op pain management  Laterality: Left  Prep: chloraprep       Needles:  Injection technique: Single-shot  Needle Type: Echogenic Stimulator Needle     Needle Length: 5cm  Needle Gauge: 22     Additional Needles:   Procedures:,,,, ultrasound used (permanent image in chart),,    Narrative:  Start time: 04/22/2024 8:00 AM End time: 04/22/2024 8:05 AM Injection made incrementally with aspirations every 5 mL.  Performed by: Personally  Anesthesiologist: Mallory Manus, MD  Additional Notes: Functioning IV was confirmed and monitors were applied.  A 50mm 22ga Arrow echogenic stimulator needle was used. Sterile prep and drape,hand hygiene and sterile gloves were used. Ultrasound guidance: relevant anatomy identified, needle position confirmed, local anesthetic spread visualized around nerve(s)., vascular puncture avoided.  Image printed for medical record. Negative aspiration and negative test dose prior to incremental administration of local anesthetic. The patient tolerated the procedure well.

## 2024-04-22 NOTE — Progress Notes (Signed)
 Orthopedic Tech Progress Note Patient Details:  LAURALEI CLOUSE [facility] 10-09-1951 [id number]  Ortho Devices Type of Ortho Device: Bone foam zero knee Ortho Device/Splint Location: LLE Ortho Device/Splint Interventions: Ordered, Application, Adjustment   Post Interventions Patient Tolerated: Well Instructions Provided: Care of device  Delanna LITTIE Pac 04/22/2024, 12:25 PM

## 2024-04-22 NOTE — H&P (Signed)
 PREOPERATIVE H&P  Chief Complaint: left knee osteoarthritis  HPI: Amy Roman is a 73 y.o. female who presents for surgical treatment of left knee osteoarthritis.  She denies any changes in medical history.  Past Surgical History:  Procedure Laterality Date  . ABDOMINAL HYSTERECTOMY    . AXILLARY LYMPH NODE DISSECTION Right 04/17/2018   Procedure: AXILLARY LYMPH NODE DISSECTION;  Surgeon: Aron Shoulders, MD;  Location: [facility];  Service: General;  Laterality: Right;  . BREAST EXCISIONAL BIOPSY Right 2007  . BREAST LUMPECTOMY Right 2019  . BREAST LUMPECTOMY WITH RADIOACTIVE SEED AND SENTINEL LYMPH NODE BIOPSY Right 04/05/2018   Procedure: RIGHT BREAST SEED BRACKETED LUMPECTOMY WITH SENTINEL LYMPH NODE BIOPSY;  Surgeon: Aron Shoulders, MD;  Location: [facility];  Service: General;  Laterality: Right;  . BREAST SURGERY     bx, no cancer   . CATARACT EXTRACTION Bilateral   . COLONOSCOPY  09/2018  . DILATATION & CURETTAGE/HYSTEROSCOPY WITH MYOSURE N/A 08/09/2017   Procedure: DILATATION & CURETTAGE/HYSTEROSCOPY WITH MYOSURE;  Surgeon: Starla Harland BROCKS, MD;  Location: [facility];  Service: Gynecology;  Laterality: N/A;  . LABIOPLASTY  03/06/2018   Procedure: LABIAPLASTY;  Surgeon: Starla Harland BROCKS, MD;  Location: [facility];  Service: Gynecology;;  . TOTAL KNEE ARTHROPLASTY Right 09/14/2020   Procedure: RIGHT TOTAL KNEE ARTHROPLASTY;  Surgeon: Jerri Kay HERO, MD;  Location: [facility];  Service: Orthopedics;  Laterality: Right;  . UPPER GI ENDOSCOPY  09/2018  . VAGINAL HYSTERECTOMY Bilateral 03/06/2018   Procedure: HYSTERECTOMY VAGINAL WITH BILATERAL SALPINGOOPHORECTOMY;  Surgeon: Starla Harland BROCKS, MD;  Location: [facility];  Service: Gynecology;  Laterality: Bilateral;   Social History   Socioeconomic History  . Marital status: Widowed    Spouse name: Not on file  . Number of children: 4  . Years of education: Not on file  . Highest education level: Not on file   Occupational History  . Occupation: house   Tobacco Use  . Smoking status: Never  . Smokeless tobacco: Never  Vaping Use  . Vaping status: Never Used  Substance and Sexual Activity  . Alcohol use: No  . Drug use: No  . Sexual activity: Not Currently    Birth control/protection: Surgical    Comment: Hysterectomy  Other Topics Concern  . Not on file  Social History Narrative   Original from [place]   [place]oved to [place]  2000   Widow    She lives with son.  She had four sons.         Social Drivers of Corporate investment banker Strain: Not on file  Food Insecurity: Not on file  Transportation Needs: Not on file  Physical Activity: Not on file  Stress: Not on file  Social Connections: Not on file   Family History  Problem Relation Age of Onset  . Diabetes Mother   . Neuropathy Mother   . Other Father   . Neuropathy Sister   . Cancer Sister        unknown type cancer in neck   . Cancer Maternal Aunt        leukemia   . Colon cancer Neg Hx   . Breast cancer Neg Hx    No Known Allergies Prior to Admission medications   Medication Sig Start Date End Date Taking? Authorizing Provider  amitriptyline  (ELAVIL ) 25 MG tablet TAKE 2 TABLETS(50 MG) BY MOUTH AT BEDTIME. MAY INCREASE TO 75 MG AT BEDTIME AFTER 2  WEEKS Patient taking differently: Take 25 mg by mouth at bedtime. 07/20/22  Yes Burton, Lacie K, NP  b complex vitamins tablet Take 1 tablet by mouth daily.   Yes [provider]  famotidine  (PEPCID ) 20 MG tablet Take 20 mg by mouth daily as needed for heartburn or indigestion.   Yes [provider]  FLUoxetine  (PROZAC ) 40 MG capsule Take 40 mg by mouth every morning. 05/04/20  Yes [provider]  gabapentin  (NEURONTIN ) 300 MG capsule Take 1 capsule (300 mg total) by mouth 3 (three) times daily. Patient taking differently: Take 300 mg by mouth daily. 10/27/20  Yes Jerri Kay HERO, MD  ondansetron  (ZOFRAN ) 4 MG tablet Take 1 tablet (4 mg total)  by mouth every 8 (eight) hours as needed for nausea or vomiting. Patient not taking: Reported on 04/10/2024 04/09/24   Jule Ronal CROME, PA-C  oxyCODONE -acetaminophen  (PERCOCET) 5-325 MG tablet Take 1-2 tablets by mouth every 6 (six) hours as needed. To be taken after surgery Patient not taking: Reported on 04/10/2024 04/09/24   Jule Ronal CROME, PA-C  tamoxifen  (NOLVADEX ) 20 MG tablet TAKE 1 TABLET(20 MG) BY MOUTH DAILY Patient taking differently: Take 20 mg by mouth daily. 02/02/24  Yes Lanny Callander, MD  traZODone  (DESYREL ) 100 MG tablet Take 100 mg by mouth at bedtime.   Yes [provider]  trospium (SANCTURA) 20 MG tablet Take 20 mg by mouth at bedtime. 04/07/24  Yes [provider]  vitamin B-12 (CYANOCOBALAMIN ) 500 MCG tablet Take 500 mcg by mouth daily.   Yes [provider]  aspirin  EC 81 MG tablet Take 1 tablet (81 mg total) by mouth 2 (two) times daily. To be taken after surgery to prevent blood clots Patient not taking: Reported on 04/10/2024 09/11/20   Jule Ronal CROME, PA-C  cholecalciferol (VITAMIN D3) 25 MCG (1000 UNIT) tablet Take 1,000 Units by mouth daily. Patient not taking: Reported on 04/10/2024    [provider]  docusate sodium  (COLACE) 100 MG capsule Take 1 capsule (100 mg total) by mouth daily as needed. Patient not taking: Reported on 04/10/2024 04/09/24 04/09/25  Jule Ronal CROME, PA-C  ferrous sulfate 325 (65 FE) MG EC tablet Take 325 mg by mouth 3 (three) times daily with meals. Patient not taking: Reported on 04/10/2024    [provider]  methocarbamol  (ROBAXIN ) 750 MG tablet Take 1 tablet (750 mg total) by mouth 3 (three) times daily as needed. Patient not taking: Reported on 04/10/2024 04/09/24   Jule Ronal CROME, PA-C     Positive ROS: All other systems have been reviewed and were otherwise negative with the exception of those mentioned in the HPI and as above.  Physical Exam: General: Alert, no acute distress Cardiovascular:  No pedal edema Respiratory: No cyanosis, no use of accessory musculature GI: abdomen soft Skin: No lesions in the area of chief complaint Neurologic: Sensation intact distally Psychiatric: Patient is competent for consent with normal mood and affect Lymphatic: no lymphedema  MUSCULOSKELETAL: exam stable  Assessment: left knee osteoarthritis  Plan: Plan for Procedure(s): ARTHROPLASTY, KNEE, TOTAL  The risks benefits and alternatives were discussed with the patient including but not limited to the risks of nonoperative treatment, versus surgical intervention including infection, bleeding, nerve injury,  blood clots, cardiopulmonary complications, morbidity, mortality, among others, and they were willing to proceed.   Ozell Jerri, MD 04/22/2024 6:46 AM

## 2024-04-22 NOTE — Transfer of Care (Signed)
 Immediate Anesthesia Transfer of Care Note  Patient: Amy Roman  Procedure(s) Performed: ARTHROPLASTY, KNEE, TOTAL (Left: Knee)  Patient Location: PACU  Anesthesia Type:Spinal  Level of Consciousness: awake, alert , and oriented  Airway & Oxygen Therapy: Patient Spontanous Breathing  Post-op Assessment: Report given to RN and Post -op Vital signs reviewed and stable  Post vital signs: Reviewed and stable  Last Vitals:  Vitals Value Taken Time  BP 147/67 04/22/24 11:45  Temp    Pulse 73 04/22/24 11:46  Resp 11 04/22/24 11:46  SpO2 95 % 04/22/24 11:46  Vitals shown include unfiled device data.  Last Pain:  Vitals:   04/22/24 0745  TempSrc:   PainSc: 0-No pain         Complications: There were no known notable events for this encounter.

## 2024-04-22 NOTE — Anesthesia Postprocedure Evaluation (Signed)
 Anesthesia Post Note  Patient: Amy Roman  Procedure(s) Performed: ARTHROPLASTY, KNEE, TOTAL (Left: Knee)     Patient location during evaluation: PACU Anesthesia Type: Regional Level of consciousness: awake and alert Pain management: pain level controlled Vital Signs Assessment: post-procedure vital signs reviewed and stable Respiratory status: spontaneous breathing, nonlabored ventilation, respiratory function stable and patient connected to nasal cannula oxygen Cardiovascular status: blood pressure returned to baseline and stable Postop Assessment: no apparent nausea or vomiting Anesthetic complications: no   There were no known notable events for this encounter.  Last Vitals:  Vitals:   04/22/24 1300 04/22/24 1315  BP: 139/71 131/80  Pulse: 74 81  Resp: (!) 9 16  Temp: (!) 34.9 C (!) 35.1 C  SpO2: 98% 99%    Last Pain:  Vitals:   04/22/24 1315  TempSrc:   PainSc: Asleep                 Carena Stream

## 2024-04-22 NOTE — TOC Initial Note (Signed)
 Transition of Care (TOC) - Initial/Assessment Note   Spoke to patient and multiple family members at bedside.   Patient from home alone but has PCS , family plans to call PCS to see if they can get hours increased .   Dr Jerri office arranged HHPT with [facility] . NCM left message with [facility] with [facility].   Patient has rolling walker at home already , but does not have 3 in 1 . NCM ordered 3 in1 with Jermaine with [employer]  Patient Details  Name: Amy Roman MRN: [id number] Date of Birth: 03/10/1951  Transition of Care [facility]) CM/SW Contact:    Stephane Powell Jansky, RN Phone Number: 04/22/2024, 4:00 PM  Clinical Narrative:                   Expected Discharge Plan: Home w Home Health Services Barriers to Discharge: Continued Medical Work up   Patient Goals and CMS Choice Patient states their goals for this hospitalization and ongoing recovery are:: to return to home CMS Medicare.gov Compare Post Acute Care list provided to:: Patient        Expected Discharge Plan and Services   Discharge Planning Services: CM Consult Post Acute Care Choice: Home Health, Durable Medical Equipment Living arrangements for the past 2 months: Single Family Home                 DME Arranged: 3-N-1 DME Agency: [employer] Date DME Agency Contacted: 04/22/24 Time DME Agency Contacted: [phone] Representative spoke with at DME Agency: jermaine HH Arranged: PT HH Agency: [facility] ([facility]) Date HH Agency Contacted: 04/22/24 Time HH Agency Contacted: [phone] Representative spoke with at [facility] Agency: Shylise left message  Prior Living Arrangements/Services Living arrangements for the past 2 months: Single Family Home Lives with:: Self Patient language and need for interpreter reviewed:: Yes Do you feel safe going back to the place where you live?: Yes      Need for Family Participation in Patient Care: Yes (Comment) Care giver support system in place?: Yes  (comment) Current home services: DME Criminal Activity/Legal Involvement Pertinent to Current Situation/Hospitalization: No - Comment as needed  Activities of Daily Living      Permission Sought/Granted   Permission granted to share information with : Yes, Verbal Permission Granted  Share Information with NAME: family at bedside  Permission granted to share info w AGENCY: [facility], [facility]        Emotional Assessment Appearance:: Appears stated age Attitude/Demeanor/Rapport: Engaged Affect (typically observed): Appropriate Orientation: : Oriented to Place, Oriented to Self, Oriented to  Time, Oriented to Situation Alcohol / Substance Use: Not Applicable Psych Involvement: No (comment)  Admission diagnosis:  Primary osteoarthritis of left knee [M17.12] Status post total left knee replacement [Z96.652] Patient Active Problem List   Diagnosis Date Noted   Status post total left knee replacement 04/22/2024   Primary osteoarthritis of left knee 04/21/2021   Status post total knee replacement, right 09/14/2020   Primary osteoarthritis of right knee 09/13/2020   Iron deficiency anemia 07/23/2018   Breast cancer metastasized to axillary lymph node, right (HCC) 04/17/2018   Malignant neoplasm of 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, estrogen receptor positive (HCC) 03/11/2018   Post-operative state 03/06/2018   Uterine hyperplasia 10/11/2017   Anxiety and depression 10/15/2014   Vitamin B 12 deficiency 06/02/2014   Vitamin D  deficiency 06/02/2014   Hereditary and idiopathic peripheral neuropathy 05/14/2014   PCP:  Ilah Crigler, MD Pharmacy:  [id number] [place], [place] - [address] AT [facility] OF [facility] & [address] [place] [address] Phone: [phone] Fax: [phone]  [id number] [place], [place] - [address] AT [facility] OF [address] & [address] [place]  [address] Phone: [phone] Fax: [phone]  [facility] [address] [place] [address] Phone: [phone] Fax: [phone]     Social Drivers of Health (SDOH) Social History: SDOH Screenings   Tobacco Use: Low Risk  (04/10/2024)   SDOH Interventions:     Readmission Risk Interventions     No data to display

## 2024-04-22 NOTE — Anesthesia Procedure Notes (Signed)
 Spinal  Patient location during procedure: OR Start time: 04/22/2024 9:12 AM End time: 04/22/2024 9:18 AM Reason for block: surgical anesthesia Staffing Anesthesiologist: Mallory Manus, MD Performed by: Mallory Manus, MD Authorized by: Mallory Manus, MD   Preanesthetic Checklist Completed: patient identified, IV checked, site marked, risks and benefits discussed, surgical consent, monitors and equipment checked, pre-op evaluation and timeout performed Spinal Block Patient position: sitting Prep: DuraPrep Patient monitoring: heart rate, cardiac monitor, continuous pulse ox and blood pressure Approach: midline Location: L3-4 Injection technique: single-shot Needle Needle type: Sprotte  Needle gauge: 24 G Needle length: 9 cm Assessment Sensory level: T4 Events: CSF return

## 2024-04-23 ENCOUNTER — Other Ambulatory Visit: Payer: Self-pay | Admitting: Physician Assistant

## 2024-04-23 ENCOUNTER — Other Ambulatory Visit: Payer: Self-pay

## 2024-04-23 ENCOUNTER — Encounter (HOSPITAL_COMMUNITY): Payer: Self-pay | Admitting: Orthopaedic Surgery

## 2024-04-23 DIAGNOSIS — M1712 Unilateral primary osteoarthritis, left knee: Secondary | ICD-10-CM | POA: Diagnosis not present

## 2024-04-23 NOTE — Evaluation (Signed)
 Occupational Therapy Evaluation Patient Details Name: Amy Roman MRN: [id number] DOB: Mar 24, 1951 Today's Date: 04/23/2024   History of Present Illness   73 y/o femal s/p L TKR on 04/22/24. PMH: OA L knee, peripheral neuropathy, Vit D deficiency, Vit B12 deficiency, anxiety, depression, uterine hyperplasia, R breast cancer, OA R knee, R knee replacement.     Clinical Impressions Patient is s/p L TKR surgery resulting in functional limitations due to the deficits listed below (see OT Problem List). Prior to admit, pt reports that she as living alone at home and independent with all ADL tasks. She does receive assistance every day for 2 hours from PCA who typically assists with laundry. Patient will benefit from acute skilled OT to increase their safety and independence with ADL and functional mobility for ADL to allow facilitate discharge. Recommend use of TTB and BSC at discharge. OT to practice tub transfer at next session. Pt would benefit from home health OT services when discharged.       If plan is discharge home, recommend the following:   A little help with walking and/or transfers;A little help with bathing/dressing/bathroom;Help with stairs or ramp for entrance;Assistance with cooking/housework;Assist for transportation     Functional Status Assessment   Patient has had a recent decline in their functional status and demonstrates the ability to make significant improvements in function in a reasonable and predictable amount of time.     Equipment Recommendations   Tub/shower seat;BSC/3in1      Precautions/Restrictions   Precautions Precautions: Fall;Knee Recall of Precautions/Restrictions: Intact Precaution/Restrictions Comments: wound vac Restrictions Weight Bearing Restrictions Per Provider Order: Yes LLE Weight Bearing Per Provider Order: Weight bearing as tolerated     Mobility Bed Mobility Overal bed mobility:  (See PT note for bed mobility)        Transfers Overall transfer level:  (See PT note for transfer)           ADL either performed or assessed with clinical judgement   ADL         General ADL Comments: Set-up for UB bathing/dressing seated, Max A LB bathing/dressing sit to stand at EOB. SBA- grooming     Vision Baseline Vision/History: 0 No visual deficits Ability to See in Adequate Light: 0 Adequate Patient Visual Report: No change from baseline Vision Assessment?: No apparent visual deficits     Perception Perception: Not tested       Praxis Praxis: Not tested       Pertinent Vitals/Pain Pain Assessment Pain Assessment: Faces Faces Pain Scale: Hurts little more Pain Location: L knee Pain Descriptors / Indicators: Grimacing, Discomfort Pain Intervention(s): Monitored during session, Limited activity within patient's tolerance, Ice applied     Extremity/Trunk Assessment Upper Extremity Assessment Upper Extremity Assessment: Overall WFL for tasks assessed   Lower Extremity Assessment Lower Extremity Assessment: Defer to PT evaluation (Pt reports that large size of BLE is heriditary)   Cervical / Trunk Assessment Cervical / Trunk Assessment: Other exceptions Cervical / Trunk Exceptions: body habitus   Communication Communication Communication: Other (comment) (English is second language)   Cognition Arousal: Alert Behavior During Therapy: WFL for tasks assessed/performed Cognition: No apparent impairments    Following commands: Intact       Cueing  General Comments   Cueing Techniques: Verbal cues  Pt educated on ice man machine use, set-up, and time frame for use. Large print instructions provided.           Home Living Family/patient expects to  be discharged to:: Private residence Living Arrangements: Alone Available Help at Discharge: Family;Available PRN/intermittently;Personal care attendant (PCA: 2 hours every day. Typically assists with cleaning) Type of Home:  House Home Access: Stairs to enter [employer] of Steps: 4 Entrance Stairs-Rails: Left Home Layout: One level     Bathroom Shower/Tub: Chief Strategy Officer: Standard     Home Equipment: Agricultural consultant (2 wheels)          Prior Functioning/Environment Prior Level of Function : Needs assist       Physical Assist : ADLs (physical)   ADLs (physical): IADLs Mobility Comments: Utilized RW ADLs Comments: PCA assists with cleaning    OT Problem List: Decreased strength;Pain;Increased edema;Decreased activity tolerance;Impaired balance (sitting and/or standing);Decreased knowledge of use of DME or AE   OT Treatment/Interventions: Self-care/ADL training;Therapeutic exercise;Therapeutic activities;DME and/or AE instruction;Patient/family education;Manual therapy;Balance training      OT Goals(Current goals can be found in the care plan section)   Acute Rehab OT Goals Patient Stated Goal: to heal OT Goal Formulation: With patient Time For Goal Achievement: 05/07/24 Potential to Achieve Goals: Good   OT Frequency:  Min 2X/week       AM-PAC OT 6 Clicks Daily Activity     Outcome Measure Help from another person eating meals?: None Help from another person taking care of personal grooming?: None Help from another person toileting, which includes using toliet, bedpan, or urinal?: A Lot Help from another person bathing (including washing, rinsing, drying)?: A Lot Help from another person to put on and taking off regular upper body clothing?: A Little Help from another person to put on and taking off regular lower body clothing?: Total 6 Click Score: 16   End of Session    Activity Tolerance: Patient tolerated treatment well Patient left: in bed;with call bell/phone within reach;with bed alarm set  OT Visit Diagnosis: Unsteadiness on feet (R26.81);Muscle weakness (generalized) (M62.81);Pain Pain - Right/Left: Left Pain - part of body: Leg                 Time: 8653-8571 OT Time Calculation (min): 42 min Charges:  OT General Charges $OT Visit: 1 Visit OT Evaluation $OT Eval Moderate Complexity: 1 Mod OT Treatments $Self Care/Home Management : 23-37 mins Amy Roman, OTR/L,CBIS  Supplemental OT - [facility] and [facility] Secure Chat Preferred    Amy Roman, Amy BIRCH 04/23/2024, 3:10 PM

## 2024-04-23 NOTE — Plan of Care (Signed)

## 2024-04-23 NOTE — TOC Progression Note (Addendum)
 Transition of Care (TOC) - Progression Note   Confirmed with Shylise with [facility] they have accepted home health referral. Shylise updated discharge to home likely tomorrow   On day of discharge nurse to change hospital VAC pump to home incisional VAC pump (prevena) Patient Details  Name: Amy Roman MRN: [id number] Date of Birth: 1951-03-06  Transition of Care [facility]) CM/SW Contact  Stephane Powell Jansky, RN Phone Number: 04/23/2024, 8:57 AM  Clinical Narrative:       Expected Discharge Plan: Home w Home Health Services Barriers to Discharge: Continued Medical Work up  Expected Discharge Plan and Services   Discharge Planning Services: CM Consult Post Acute Care Choice: Home Health, Durable Medical Equipment Living arrangements for the past 2 months: Single Family Home                 DME Arranged: 3-N-1 DME Agency: [employer] Date DME Agency Contacted: 04/22/24 Time DME Agency Contacted: [phone] Representative spoke with at DME Agency: jermaine HH Arranged: PT HH Agency: [facility] ([facility]) Date HH Agency Contacted: 04/22/24 Time HH Agency Contacted: 1559 Representative spoke with at [facility] Agency: Shylise left message   Social Determinants of Health (SDOH) Interventions SDOH Screenings   Food Insecurity: No Food Insecurity (04/23/2024)  Housing: Low Risk  (04/23/2024)  Transportation Needs: No Transportation Needs (04/23/2024)  Utilities: Not At Risk (04/23/2024)  Social Connections: Moderately Integrated (04/23/2024)  Tobacco Use: Low Risk  (04/23/2024)    Readmission Risk Interventions     No data to display

## 2024-04-23 NOTE — Progress Notes (Signed)
 Subjective: 1 Day Post-Op Procedure(s) (LRB): ARTHROPLASTY, KNEE, TOTAL (Left) Patient reports pain as mild.    Objective: Vital signs in last 24 hours: Temp:  [94.5 F (34.7 C)-98.6 F (37 C)] 97.9 F (36.6 C) (07/01 0759) Pulse Rate:  [63-83] 78 (07/01 0759) Resp:  [9-22] 18 (07/01 0759) BP: (104-159)/(51-87) 104/75 (07/01 0759) SpO2:  [91 %-100 %] 94 % (07/01 0759)  Intake/Output from previous day: 06/30 0701 - 07/01 0700 In: 2150 [I.V.:1700; IV Piggyback:450] Out: 1350 [Urine:1300; Blood:50] Intake/Output this shift: No intake/output data recorded.  No results for input(s): HGB in the last 72 hours. No results for input(s): WBC, RBC, HCT, PLT in the last 72 hours. No results for input(s): NA, K, CL, CO2, BUN, CREATININE, GLUCOSE, CALCIUM in the last 72 hours. No results for input(s): LABPT, INR in the last 72 hours.  Neurologically intact Neurovascular intact Sensation intact distally Intact pulses distally Dorsiflexion/Plantar flexion intact Incision: dressing C/D/I No cellulitis present Compartment soft Ivac in place with good seal.  No fluid in canister    Assessment/Plan: 1 Day Post-Op Procedure(s) (LRB): ARTHROPLASTY, KNEE, TOTAL (Left) Advance diet Up with therapy D/C IV fluids Discharge home with home health likely tomorrow WBAT LLE Continue ivac- please swap out hospital unit DAY OF DISCHARGE        Ronal LITTIE Grave 04/23/2024, 8:26 AM

## 2024-04-23 NOTE — Progress Notes (Signed)
 Physical Therapy Treatment Patient Details Name: Amy Roman MRN: [id number] DOB: 09/28/1951 Today's Date: 04/23/2024   History of Present Illness Pt is 73 year old presented to [facility] on  04/22/24 for lt TKR. PMH - breast   CA, rt TKR, peripheral neuropathy, arthritis, obesity, anxiety, depression    PT Comments  Pt resting in bed on arrival and agreeable to session with great progress towards acute goals. Pt able to progress gait with RW for support and grossly CGA for safety ~95'. Pt continues to require increased assist, up to mod A, to complete bed mobility as pt with difficulty mobilizing Les off/on bed due to weakness. Plan to initiate stair training in PM session. Pt continues to benefit from skilled PT services to progress toward functional mobility goals.     If plan is discharge home, recommend the following: A lot of help with walking and/or transfers;A lot of help with bathing/dressing/bathroom;Assist for transportation;Assistance with cooking/housework;Help with stairs or ramp for entrance   Can travel by private vehicle        Equipment Recommendations  None recommended by PT    Recommendations for Other Services       Precautions / Restrictions Precautions Precautions: Fall;Knee Restrictions Weight Bearing Restrictions Per Provider Order: Yes LLE Weight Bearing Per Provider Order: Weight bearing as tolerated     Mobility  Bed Mobility Overal bed mobility: Needs Assistance Bed Mobility: Supine to Sit, Sit to Supine     Supine to sit: Mod assist, HOB elevated Sit to supine: Mod assist   General bed mobility comments: Assist to bring legs off of bed and return to bed at end of session    Transfers Overall transfer level: Needs assistance Equipment used: Rolling walker (2 wheels) Transfers: Sit to/from Stand Sit to Stand: Contact guard assist           General transfer comment: CGA for safety, good hand placement and steady rise     Ambulation/Gait Ambulation/Gait assistance: Contact guard assist Gait Distance (Feet): 95 Feet Assistive device: Rolling walker (2 wheels) Gait Pattern/deviations: Step-to pattern, Decreased stance time - right, Decreased stride length, Decreased step length - right Gait velocity: decr     General Gait Details: short slow step-to pattern with RW for support, no overt LOB, light cues for closer RW proximity   Stairs             Wheelchair Mobility     Tilt Bed    Modified Rankin (Stroke Patients Only)       Balance Overall balance assessment: Needs assistance Sitting-balance support: No upper extremity supported, Feet supported Sitting balance-Leahy Scale: Good Sitting balance - Comments: able to maintain without UE support   Standing balance support: Bilateral upper extremity supported, During functional activity Standing balance-Leahy Scale: Poor Standing balance comment: reliant on UE support                            Communication Communication Communication: Other (comment) (English is her second language but she is functional with [place].)  Cognition Arousal: Alert Behavior During Therapy: WFL for tasks assessed/performed   PT - Cognitive impairments: No apparent impairments                         Following commands: Intact      Cueing Cueing Techniques: Verbal cues, Tactile cues  Exercises      General Comments  Pertinent Vitals/Pain Pain Assessment Pain Assessment: Faces Faces Pain Scale: Hurts little more Pain Location: lt knee Pain Descriptors / Indicators: Grimacing, Guarding Pain Intervention(s): Premedicated before session, Monitored during session, Limited activity within patient's tolerance    Home Living                          Prior Function            PT Goals (current goals can now be found in the care plan section) Acute Rehab PT Goals Patient Stated Goal: go home PT Goal  Formulation: With patient Time For Goal Achievement: 05/06/24 Progress towards PT goals: Progressing toward goals    Frequency    7X/week      PT Plan      Co-evaluation              AM-PAC PT 6 Clicks Mobility   Outcome Measure  Help needed turning from your back to your side while in a flat bed without using bedrails?: A Little Help needed moving from lying on your back to sitting on the side of a flat bed without using bedrails?: A Lot Help needed moving to and from a bed to a chair (including a wheelchair)?: A Little Help needed standing up from a chair using your arms (e.g., wheelchair or bedside chair)?: A Little Help needed to walk in hospital room?: A Little Help needed climbing 3-5 steps with a railing? : A Lot 6 Click Score: 16    End of Session   Activity Tolerance: Patient tolerated treatment well Patient left: in bed;with call bell/phone within reach Nurse Communication: Mobility status PT Visit Diagnosis: Other abnormalities of gait and mobility (R26.89);Muscle weakness (generalized) (M62.81);Difficulty in walking, not elsewhere classified (R26.2);Pain Pain - Right/Left: Left Pain - part of body: Knee     Time: 9094-9067 PT Time Calculation (min) (ACUTE ONLY): 27 min  Charges:    $Gait Training: 8-22 mins $Therapeutic Activity: 8-22 mins PT General Charges $$ ACUTE PT VISIT: 1 Visit                     Leota Maka R. PTA Acute Rehabilitation Services Office: [phone]   [name] 04/23/2024, 9:50 AM

## 2024-04-23 NOTE — Progress Notes (Signed)
 Physical Therapy Treatment Patient Details Name: Amy Roman MRN: [id number] DOB: [birth year] Today's Date: 04/23/2024   History of Present Illness 73 y/o femal s/p L TKR on 04/22/24. PMH: OA L knee, peripheral neuropathy, Vit D deficiency, Vit B12 deficiency, anxiety, depression, uterine hyperplasia, R breast cancer, OA R knee, R knee replacement.    PT Comments  Pt resting in bed on arrival, pleasant and agreeable to PM session with good progress towards acute goals. Pt able to progress gait distance with RW for support and CGA for safety with increased cadence. Pt requiring grossly CGA for stair negotiation with pt able to ascend/descend 2 steps in stairwell with single rail support with good recall for LE sequencing from prior R knee surgery. Pt continues to require increased assist to return Les to bed at end of session due to weakness and body habitus. Pt continues to benefit from skilled PT services to progress toward functional mobility goals.     If plan is discharge home, recommend the following: A lot of help with walking and/or transfers;A lot of help with bathing/dressing/bathroom;Assist for transportation;Assistance with cooking/housework;Help with stairs or ramp for entrance   Can travel by private vehicle        Equipment Recommendations  None recommended by PT    Recommendations for Other Services       Precautions / Restrictions Precautions Precautions: Fall;Knee Recall of Precautions/Restrictions: Intact Precaution/Restrictions Comments: wound vac Restrictions Weight Bearing Restrictions Per Provider Order: Yes LLE Weight Bearing Per Provider Order: Weight bearing as tolerated     Mobility  Bed Mobility Overal bed mobility: Needs Assistance Bed Mobility: Supine to Sit, Sit to Supine     Supine to sit: HOB elevated, Min assist Sit to supine: Mod assist   General bed mobility comments: min A to bring LEs to and off EOB, with increased time pt able to  sefl scoot out to EOB to place feet on floor, mod A to bring LEs into bed    Transfers Overall transfer level: Needs assistance Equipment used: Rolling walker (2 wheels) Transfers: Sit to/from Stand Sit to Stand: Contact guard assist           General transfer comment: CGA for safety, good hand placement and steady rise    Ambulation/Gait Ambulation/Gait assistance: Contact guard assist Gait Distance (Feet): 135 Feet Assistive device: Rolling walker (2 wheels) Gait Pattern/deviations: Step-to pattern, Decreased stance time - right, Decreased stride length, Decreased step length - right Gait velocity: decr     General Gait Details: short slow step-to pattern with RW for support, no overt LOB, light cues for closer RW proximity   Stairs Stairs: Yes Stairs assistance: Contact guard assist Stair Management: One rail Right, Step to pattern, Forwards Number of Stairs: 2 General stair comments: up/down x2 steps with CGA for safety, good recall for LE sequencing with pt stepping up with R and down with L   Wheelchair Mobility     Tilt Bed    Modified Rankin (Stroke Patients Only)       Balance Overall balance assessment: Needs assistance Sitting-balance support: No upper extremity supported, Feet supported Sitting balance-Leahy Scale: Good Sitting balance - Comments: able to maintain without UE support   Standing balance support: Bilateral upper extremity supported, During functional activity Standing balance-Leahy Scale: Poor Standing balance comment: reliant on UE support  Communication Communication Communication: Other (comment) (English is second language)  Cognition Arousal: Alert Behavior During Therapy: WFL for tasks assessed/performed   PT - Cognitive impairments: No apparent impairments                         Following commands: Intact      Cueing Cueing Techniques: Verbal cues  Exercises Total  Joint Exercises Long Arc Quad: AROM, Left, 10 reps, Seated    General Comments General comments (skin integrity, edema, etc.): Pt educated on ice man machine use, set-up, and time frame for use. Large print instructions provided.      Pertinent Vitals/Pain Pain Assessment Pain Assessment: Faces Faces Pain Scale: Hurts little more Pain Location: L knee Pain Descriptors / Indicators: Grimacing, Discomfort Pain Intervention(s): Monitored during session, Limited activity within patient's tolerance    Home Living Family/patient expects to be discharged to:: Private residence Living Arrangements: Alone Available Help at Discharge: Family;Available PRN/intermittently;Personal care attendant (PCA: 2 hours every day. Typically assists with cleaning) Type of Home: House Home Access: Stairs to enter Entrance Stairs-Rails: Left Entrance Stairs-Number of Steps: 4   Home Layout: One level Home Equipment: Agricultural consultant (2 wheels)      Prior Function            PT Goals (current goals can now be found in the care plan section) Acute Rehab PT Goals Patient Stated Goal: go home PT Goal Formulation: With patient Time For Goal Achievement: 05/06/24 Progress towards PT goals: Progressing toward goals    Frequency    7X/week      PT Plan      Co-evaluation              AM-PAC PT 6 Clicks Mobility   Outcome Measure  Help needed turning from your back to your side while in a flat bed without using bedrails?: A Little Help needed moving from lying on your back to sitting on the side of a flat bed without using bedrails?: A Lot Help needed moving to and from a bed to a chair (including a wheelchair)?: A Little Help needed standing up from a chair using your arms (e.g., wheelchair or bedside chair)?: A Little Help needed to walk in hospital room?: A Little Help needed climbing 3-5 steps with a railing? : A Little 6 Click Score: 17    End of Session   Activity Tolerance:  Patient tolerated treatment well Patient left: in bed;with call bell/phone within reach;with family/visitor present Nurse Communication: Mobility status PT Visit Diagnosis: Other abnormalities of gait and mobility (R26.89);Muscle weakness (generalized) (M62.81);Difficulty in walking, not elsewhere classified (R26.2);Pain Pain - Right/Left: Left Pain - part of body: Knee     Time: 8481-8458 PT Time Calculation (min) (ACUTE ONLY): 23 min  Charges:    $Gait Training: 23-37 mins PT General Charges $$ ACUTE PT VISIT: 1 Visit                     Mysha Peeler R. PTA Acute Rehabilitation Services Office: [phone]   [name] 04/23/2024, 3:54 PM

## 2024-04-24 ENCOUNTER — Other Ambulatory Visit (HOSPITAL_COMMUNITY): Payer: Self-pay

## 2024-04-24 DIAGNOSIS — M1712 Unilateral primary osteoarthritis, left knee: Secondary | ICD-10-CM | POA: Diagnosis not present

## 2024-04-24 NOTE — TOC Progression Note (Signed)
 Transition of Care (TOC) - Progression Note   Amy Roman with [facility] aware discharge today.   3 in 1 was ordered yesterday.   Bedside nurse to connect [facility]  Patient Details   Name: Amy Roman MRN: [id number] Date of Birth: 05-06-1951  Transition of Care [facility]) CM/SW Contact  Abbegail Matuska, Powell Jansky, RN Phone Number: 04/24/2024, 9:56 AM  Clinical Narrative:       Expected Discharge Plan: Home w Home Health Services Barriers to Discharge: Continued Medical Work up  Expected Discharge Plan and Services   Discharge Planning Services: CM Consult Post Acute Care Choice: Home Health, Durable Medical Equipment Living arrangements for the past 2 months: Single Family Home Expected Discharge Date: 04/24/24               DME Arranged: 3-N-1 DME Agency: [employer] Date DME Agency Contacted: 04/22/24 Time DME Agency Contacted: [phone] Representative spoke with at DME Agency: jermaine HH Arranged: PT HH Agency: [facility] Date HH Agency Contacted: 04/22/24 Time HH Agency Contacted: [phone] Representative spoke with at [facility] Agency: Amy Roman left message   Social Determinants of Health (SDOH) Interventions SDOH Screenings   Food Insecurity: No Food Insecurity (04/23/2024)  Housing: Low Risk  (04/23/2024)  Transportation Needs: No Transportation Needs (04/23/2024)  Utilities: Not At Risk (04/23/2024)  Social Connections: Moderately Integrated (04/23/2024)  Tobacco Use: Low Risk  (04/23/2024)    Readmission Risk Interventions     No data to display

## 2024-04-24 NOTE — Progress Notes (Signed)
 Subjective: 2 Days Post-Op Procedure(s) (LRB): ARTHROPLASTY, KNEE, TOTAL (Left) Patient reports pain as mild.  No complaints this am.  Ready to go home  Objective: Vital signs in last 24 hours: Temp:  [97.7 F (36.5 C)-98.6 F (37 C)] 98 F (36.7 C) (07/02 0733) Pulse Rate:  [77-89] 89 (07/02 0733) Resp:  [16-18] 16 (07/02 0733) BP: (121-143)/(53-79) 130/79 (07/02 0733) SpO2:  [91 %-100 %] 100 % (07/02 0733)  Intake/Output from previous day: 07/01 0701 - 07/02 0700 In: 480 [P.O.:480] Out: 600 [Urine:600] Intake/Output this shift: No intake/output data recorded.  No results for input(s): HGB in the last 72 hours. No results for input(s): WBC, RBC, HCT, PLT in the last 72 hours. No results for input(s): NA, K, CL, CO2, BUN, CREATININE, GLUCOSE, CALCIUM in the last 72 hours. No results for input(s): LABPT, INR in the last 72 hours.  Neurologically intact Neurovascular intact Sensation intact distally Intact pulses distally Dorsiflexion/Plantar flexion intact Incision: dressing C/D/I No cellulitis present Compartment soft Ivac in place with good seal  Assessment/Plan: 2 Days Post-Op Procedure(s) (LRB): ARTHROPLASTY, KNEE, TOTAL (Left) Advance diet Up with therapy D/C IV fluids Discharge home with home health once cleared by PT WBAT LLE Continue ivac- please swap out hospital unit for [facility] prior to discharge      Ronal LITTIE Grave 04/24/2024, 8:20 AM

## 2024-04-24 NOTE — Care Management Obs Status (Signed)
 MEDICARE OBSERVATION STATUS NOTIFICATION   Patient Details  Name: Amy Roman MRN: [id number] Date of Birth: 1950/11/13   Medicare Observation Status Notification Given:  Yes    Jon Cruel 04/24/2024, 9:36 AM

## 2024-04-24 NOTE — Progress Notes (Signed)
 Physical Therapy Treatment Patient Details Name: Amy Roman MRN: [id number] DOB: 05-02-1951 Today's Date: 04/24/2024   History of Present Illness 73 y/o femal s/p L TKR on 04/22/24. PMH: OA L knee, peripheral neuropathy, Vit D deficiency, Vit B12 deficiency, anxiety, depression, uterine hyperplasia, R breast cancer, OA R knee, R knee replacement.    PT Comments  Pt resting in bed on arrival, pleasant and agreeable to session. Pt demonstrating slow but steady progress towards acute goals this session, able to come to sitting EOB with CGA for safety with increased time, but no physical assist needed. Pt demonstrating x3 short household distance gait bouts with grossly CGA for safety with RW for support. Pt continues to be limited in safe mobility by decreased activity tolerance, poor balance/postural reactions and weakness. Pt continues to benefit from skilled PT services to progress toward functional mobility goals.      If plan is discharge home, recommend the following: A lot of help with walking and/or transfers;A lot of help with bathing/dressing/bathroom;Assist for transportation;Assistance with cooking/housework;Help with stairs or ramp for entrance   Can travel by private vehicle        Equipment Recommendations  None recommended by PT    Recommendations for Other Services       Precautions / Restrictions Precautions Precautions: Fall;Knee Recall of Precautions/Restrictions: Intact Precaution/Restrictions Comments: wound vac Restrictions Weight Bearing Restrictions Per Provider Order: Yes LLE Weight Bearing Per Provider Order: Weight bearing as tolerated     Mobility  Bed Mobility Overal bed mobility: Needs Assistance Bed Mobility: Supine to Sit, Sit to Supine     Supine to sit: HOB elevated, Contact guard     General bed mobility comments: increased time, pt able to self mobilize LEs to EOB and scoot out to place feet on floor,    Transfers Overall transfer  level: Needs assistance Equipment used: Rolling walker (2 wheels) Transfers: Sit to/from Stand, Bed to chair/wheelchair/BSC Sit to Stand: Contact guard assist   Step pivot transfers: Contact guard assist       General transfer comment: CGA for safety, good hand placement and steady rise    Ambulation/Gait Ambulation/Gait assistance: Contact guard assist Gait Distance (Feet): 15 Feet (x3) Assistive device: Rolling walker (2 wheels) Gait Pattern/deviations: Step-to pattern, Decreased stance time - right, Decreased stride length, Decreased step length - right Gait velocity: decr     General Gait Details: pt ambulating household distances in room to complete toileting, washup and dressing   Stairs             Wheelchair Mobility     Tilt Bed    Modified Rankin (Stroke Patients Only)       Balance Overall balance assessment: Needs assistance Sitting-balance support: No upper extremity supported, Feet supported Sitting balance-Leahy Scale: Good Sitting balance - Comments: able to maintain without UE support   Standing balance support: Bilateral upper extremity supported, During functional activity Standing balance-Leahy Scale: Poor Standing balance comment: reliant on UE support                            Communication Communication Communication: Other (comment) (English is second language)  Cognition Arousal: Alert Behavior During Therapy: WFL for tasks assessed/performed   PT - Cognitive impairments: No apparent impairments                         Following commands: Intact  Cueing Cueing Techniques: Verbal cues  Exercises Total Joint Exercises Long Arc Quad: AROM, Seated, Left, 5 reps    General Comments General comments (skin integrity, edema, etc.): pt able to donn clothes with set up      Pertinent Vitals/Pain Pain Assessment Pain Assessment: Faces Faces Pain Scale: Hurts even more Pain Location: L knee Pain  Descriptors / Indicators: Grimacing, Discomfort Pain Intervention(s): Monitored during session, Limited activity within patient's tolerance, Premedicated before session    Home Living                          Prior Function            PT Goals (current goals can now be found in the care plan section) Acute Rehab PT Goals Patient Stated Goal: go home PT Goal Formulation: With patient Time For Goal Achievement: 05/06/24 Progress towards PT goals: Progressing toward goals    Frequency    7X/week      PT Plan      Co-evaluation              AM-PAC PT 6 Clicks Mobility   Outcome Measure  Help needed turning from your back to your side while in a flat bed without using bedrails?: A Little Help needed moving from lying on your back to sitting on the side of a flat bed without using bedrails?: A Lot Help needed moving to and from a bed to a chair (including a wheelchair)?: A Little Help needed standing up from a chair using your arms (e.g., wheelchair or bedside chair)?: A Little Help needed to walk in hospital room?: A Little Help needed climbing 3-5 steps with a railing? : A Little 6 Click Score: 17    End of Session   Activity Tolerance: Patient tolerated treatment well;Patient limited by pain Patient left: with call bell/phone within reach;with family/visitor present;in chair (seated up in standard chair ready for transport) Nurse Communication: Mobility status PT Visit Diagnosis: Other abnormalities of gait and mobility (R26.89);Muscle weakness (generalized) (M62.81);Difficulty in walking, not elsewhere classified (R26.2);Pain Pain - Right/Left: Left Pain - part of body: Knee     Time: 9064-8984 PT Time Calculation (min) (ACUTE ONLY): 40 min  Charges:    $Gait Training: 8-22 mins $Therapeutic Activity: 23-37 mins PT General Charges $$ ACUTE PT VISIT: 1 Visit                     Amy Roman R. PTA Acute Rehabilitation Services Office:  [phone]   Amy Roman 04/24/2024, 10:38 AM

## 2024-04-24 NOTE — Plan of Care (Signed)
   Problem: Education: Goal: Knowledge of General Education information will improve Description: Including pain rating scale, medication(s)/side effects and non-pharmacologic comfort measures Outcome: Progressing   Problem: Clinical Measurements: Goal: Ability to maintain clinical measurements within normal limits will improve Outcome: Progressing

## 2024-04-24 NOTE — Discharge Summary (Signed)
 Patient ID: Amy Roman MRN: [id number] DOB/AGE: 73/11/1950 73 y.o.  Admit date: 04/22/2024 Discharge date: 04/24/2024  Admission Diagnoses:  Principal Problem:   Primary osteoarthritis of left knee Active Problems:   Status post total left knee replacement   Discharge Diagnoses:  Same  Past Medical History:  Diagnosis Date   Anemia    Anxiety and depression 10/15/2014   Arthritis    BILATERAL KNEES, HAD CORTISONE INJECTION 03/2017   Breast cancer (HCC) 2019   Right Breast Cancer   Depression    GERD (gastroesophageal reflux disease)    OCCASIONAL WITH CERTAIN FOOD   Headache    patient denies this dx - occasional HA   Heart murmur 2021   pt denies this dx; in 2021, no mumur noted by Dr Patrisha   History of blood transfusion 02/2018   History of radiation therapy 05/28/18- 07/10/18   Right Breast 25 fractions for a total dose of 50 Gy, Right SCV and PAB 25 fractions for a total dose of 50 Gy, right breast boost, 5 fractions for a total dose of 10 Gy.    Personal history of radiation therapy    Right Breast Cancer   Seasonal allergies    SVD (spontaneous vaginal delivery)    x 4   Unspecified hereditary and idiopathic peripheral neuropathy 05/14/2014   LEFT KNEE AND FOOT/HANDS    Surgeries: Procedure(s): ARTHROPLASTY, KNEE, TOTAL on 04/22/2024   Consultants:   Discharged Condition: Improved  Hospital Course: Amy Roman is an 73 y.o. female who was admitted 04/22/2024 for operative treatment ofPrimary osteoarthritis of left knee. Patient has severe unremitting pain that affects sleep, daily activities, and work/hobbies. After pre-op clearance the patient was taken to the operating room on 04/22/2024 and underwent  Procedure(s): ARTHROPLASTY, KNEE, TOTAL.    Patient was given perioperative antibiotics:  Anti-infectives (From admission, onward)    Start     Dose/Rate Route Frequency Ordered Stop   04/22/24 1215  ceFAZolin  (ANCEF ) IVPB 2g/100 mL premix         2 g 200 mL/hr over 30 Minutes Intravenous Every 6 hours 04/22/24 1213 04/22/24 1930   04/22/24 1045  vancomycin  (VANCOCIN ) powder  Status:  Discontinued          As needed 04/22/24 1109 04/22/24 1142   04/22/24 0715  ceFAZolin  (ANCEF ) IVPB 2g/100 mL premix        2 g 200 mL/hr over 30 Minutes Intravenous On call to O.R. 04/22/24 0700 04/22/24 0950        Patient was given sequential compression devices, early ambulation, and chemoprophylaxis to prevent DVT.  Patient benefited maximally from hospital stay and there were no complications.    Recent vital signs: Patient Vitals for the past 24 hrs:  BP Temp Temp src Pulse Resp SpO2  04/24/24 0733 130/79 98 F (36.7 C) Oral 89 16 100 %  04/24/24 0524 (!) 143/53 98.6 F (37 C) Oral 84 18 93 %  04/23/24 2021 (!) 140/69 97.7 F (36.5 C) Oral 87 18 94 %  04/23/24 1502 135/61 98.2 F (36.8 C) Oral 79 18 97 %  04/23/24 1127 121/65 97.8 F (36.6 C) Oral 77 18 91 %     Recent laboratory studies: No results for input(s): WBC, HGB, HCT, PLT, NA, K, CL, CO2, BUN, CREATININE, GLUCOSE, INR, CALCIUM in the last 72 hours.  Invalid input(s): PT, 2   Discharge Medications:   Allergies as of 04/24/2024   No Known Allergies  Medication List     STOP taking these medications    aspirin  EC 81 MG tablet       TAKE these medications    amitriptyline  25 MG tablet Commonly known as: ELAVIL  TAKE 2 TABLETS(50 MG) BY MOUTH AT BEDTIME. MAY INCREASE TO 75 MG AT BEDTIME AFTER 2 WEEKS What changed:  how much to take how to take this when to take this additional instructions   b complex vitamins tablet Take 1 tablet by mouth daily.   cholecalciferol 25 MCG (1000 UNIT) tablet Commonly known as: VITAMIN D3 Take 1,000 Units by mouth daily.   docusate sodium  100 MG capsule Commonly known as: Colace Take 1 capsule (100 mg total) by mouth daily as needed.   doxycycline  100 MG tablet Commonly known  as: VIBRA -TABS Take 1 tablet (100 mg total) by mouth 2 (two) times daily.   Eliquis 2.5 MG Tabs tablet Generic drug: apixaban Take 1 tablet (2.5 mg total) by mouth 2 (two) times daily for 30 days.   famotidine  20 MG tablet Commonly known as: PEPCID  Take 20 mg by mouth daily as needed for heartburn or indigestion.   ferrous sulfate 325 (65 FE) MG EC tablet Take 325 mg by mouth 3 (three) times daily with meals.   FLUoxetine  40 MG capsule Commonly known as: PROZAC  Take 40 mg by mouth every morning.   gabapentin  300 MG capsule Commonly known as: NEURONTIN  Take 1 capsule (300 mg total) by mouth 3 (three) times daily. What changed: when to take this   methocarbamol  750 MG tablet Commonly known as: ROBAXIN  Take 1 tablet (750 mg total) by mouth 3 (three) times daily as needed.   ondansetron  4 MG tablet Commonly known as: Zofran  Take 1 tablet (4 mg total) by mouth every 8 (eight) hours as needed for nausea or vomiting.   oxyCODONE -acetaminophen  5-325 MG tablet Commonly known as: Percocet Take 1-2 tablets by mouth every 6 (six) hours as needed. To be taken after surgery   tamoxifen  20 MG tablet Commonly known as: NOLVADEX  TAKE 1 TABLET(20 MG) BY MOUTH DAILY What changed: See the new instructions.   traZODone  100 MG tablet Commonly known as: DESYREL  Take 100 mg by mouth at bedtime.   trospium 20 MG tablet Commonly known as: SANCTURA Take 20 mg by mouth at bedtime.   vitamin B-12 500 MCG tablet Commonly known as: CYANOCOBALAMIN  Take 500 mcg by mouth daily.               Durable Medical Equipment  (From admission, onward)           Start     Ordered   04/22/24 1538  DME Walker rolling  Once       Question Answer Comment  Walker: With 5 Inch Wheels   Patient needs a walker to treat with the following condition Status post left partial knee replacement      04/22/24 1537   04/22/24 1538  DME 3 n 1  Once        04/22/24 1537   04/22/24 1538  DME Bedside  commode  Once       Question:  Patient needs a bedside commode to treat with the following condition  Answer:  Status post left partial knee replacement   04/22/24 1537            Diagnostic Studies: DG Knee Left Port Result Date: 04/22/2024 CLINICAL DATA:  Postoperative. EXAM: PORTABLE LEFT KNEE - 1-2 VIEW COMPARISON:  Left knee radiographs  01/16/2024 FINDINGS: Interval total left knee arthroplasty. No perihardware lucency is seen to indicate hardware failure or loosening. Expected postoperative changes including intra-articular and subcutaneous air. Smalljoint effusion. Anterior surgical skin staples. No acute fracture or dislocation. A wound VAC overlies the anteromedial left calf. IMPRESSION: Interval total left knee arthroplasty without evidence of hardware failure. Electronically Signed   By: Tanda Lyons M.D.   On: 04/22/2024 13:57    Disposition: Discharge disposition: 01-Home or Self Care          Follow-up Information     Jule Ronal CROME, PA-C. Schedule an appointment as soon as possible for a visit in 1 week(s).   Specialty: Orthopedic Surgery Contact information: [address] [place] [address] [phone]                  Signed: Ronal CROME Jule 04/24/2024, 8:21 AM

## 2024-05-02 ENCOUNTER — Telehealth: Payer: Self-pay

## 2024-05-02 NOTE — Telephone Encounter (Signed)
 Patients son called stating that patient is having a lot of pain and wanted to know if patient could get a Rx for pain.  Patient had left knee surgery on 04/22/2024.  Cb# [phone].  Please advise.  Thank you.

## 2024-05-03 MED ORDER — OXYCODONE-ACETAMINOPHEN 5-325 MG PO TABS: 1.0000 | ORAL_TABLET | Freq: Every day | ORAL | 0 refills | Status: DC | PRN

## 2024-05-03 NOTE — Telephone Encounter (Signed)
 done

## 2024-05-03 NOTE — Telephone Encounter (Signed)
Tried to call. No answer. Mail box is full 

## 2024-05-03 NOTE — Addendum Note (Signed)
 Addended by: JERRI LOISE SHARPER on: 05/03/2024 07:40 AM   Modules accepted: Orders

## 2024-05-07 ENCOUNTER — Encounter: Payer: Self-pay | Admitting: Nurse Practitioner

## 2024-05-07 ENCOUNTER — Ambulatory Visit (INDEPENDENT_AMBULATORY_CARE_PROVIDER_SITE_OTHER): Admitting: Physician Assistant

## 2024-05-07 ENCOUNTER — Encounter: Payer: Self-pay | Admitting: Physician Assistant

## 2024-05-07 DIAGNOSIS — Z96652 Presence of left artificial knee joint: Secondary | ICD-10-CM

## 2024-05-07 MED ORDER — OXYCODONE-ACETAMINOPHEN 5-325 MG PO TABS: 1.0000 | ORAL_TABLET | Freq: Three times a day (TID) | ORAL | 0 refills | Status: DC | PRN

## 2024-05-07 MED ORDER — METHOCARBAMOL 750 MG PO TABS: 750.0000 mg | ORAL_TABLET | Freq: Three times a day (TID) | ORAL | 2 refills | Status: DC | PRN

## 2024-05-07 NOTE — Progress Notes (Signed)
 Post-Op Visit Note   Patient: Amy Roman           Date of Birth: 11/08/1950           MRN: [id number] Visit Date: 05/07/2024 PCP: Ilah Crigler, MD   Assessment & Plan:  Chief Complaint:  Chief Complaint  Patient presents with   Left Knee - Follow-up    Left TKA 04/22/2024   Visit Diagnoses:  1. Status post total left knee replacement     Plan: Patient is a pleasant 73 year old female who comes in today 2 weeks status post left total knee replacement 04/22/2024.  She has been doing relatively well.  She is at home getting home health PT.  She has been taking oxycodone  and Robaxin  for pain.  She has been taking Eliquis  for DVT prophylaxis.  Examination of left knee reveals well-healing surgical incision with nylon sutures in place.  No evidence of infection or cellulitis.  Calves are soft nontender.  She is neurovascularly intact distally.  Today, sutures were removed and Steri-Strips applied.  Outpatient PT referral has been made.  I have refilled her oxycodone  and Robaxin .  She will continue taking her Eliquis  for another 2 weeks and then transition to a baby aspirin  twice daily for 2 weeks.  Follow-up in 4 weeks for repeat evaluation and 2 view x-rays of the left knee.  Call with concerns or questions.  Follow-Up Instructions: Return in about 4 weeks (around 06/04/2024).   Orders:  Orders Placed This Encounter  Procedures   Ambulatory referral to Physical Therapy   Meds ordered this encounter  Medications   methocarbamol  (ROBAXIN ) 750 MG tablet    Sig: Take 1 tablet (750 mg total) by mouth 3 (three) times daily as needed.    Dispense:  30 tablet    Refill:  2   oxyCODONE -acetaminophen  (PERCOCET) 5-325 MG tablet    Sig: Take 1-2 tablets by mouth every 8 (eight) hours as needed. To be taken after surgery    Dispense:  40 tablet    Refill:  0    Imaging: No new imaging  PMFS History: Patient Active Problem List   Diagnosis Date Noted   Status post total left  knee replacement 04/22/2024   Primary osteoarthritis of left knee 04/21/2021   Status post total knee replacement, right 09/14/2020   Primary osteoarthritis of right knee 09/13/2020   Iron deficiency anemia 07/23/2018   Breast cancer metastasized to axillary lymph node, right (HCC) 04/17/2018   Malignant neoplasm of 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, estrogen receptor positive (HCC) 03/11/2018   Post-operative state 03/06/2018   Uterine hyperplasia 10/11/2017   Anxiety and depression 10/15/2014   Vitamin B 12 deficiency 06/02/2014   Vitamin D  deficiency 06/02/2014   Hereditary and idiopathic peripheral neuropathy 05/14/2014   Past Medical History:  Diagnosis Date   Anemia    Anxiety and depression 10/15/2014   Arthritis    BILATERAL KNEES, HAD CORTISONE INJECTION 03/2017   Breast cancer (HCC) 2019   Right Breast Cancer   Depression    GERD (gastroesophageal reflux disease)    OCCASIONAL WITH CERTAIN FOOD   Headache    patient denies this dx - occasional HA   Heart murmur 2021   pt denies this dx; in 2021, no mumur noted by Dr Patrisha   History of blood transfusion 02/2018   History of radiation therapy 05/28/18- 07/10/18   Right Breast 25 fractions for a total dose of 50 Gy, Right  SCV and PAB 25 fractions for a total dose of 50 Gy, right breast boost, 5 fractions for a total dose of 10 Gy.    Personal history of radiation therapy    Right Breast Cancer   Seasonal allergies    SVD (spontaneous vaginal delivery)    x 4   Unspecified hereditary and idiopathic peripheral neuropathy 05/14/2014   LEFT KNEE AND FOOT/HANDS    Family History  Problem Relation Age of Onset   Diabetes Mother    Neuropathy Mother    Other Father    Neuropathy Sister    Cancer Sister        unknown type cancer in neck    Cancer Maternal Aunt        leukemia    Colon cancer Neg Hx    Breast cancer Neg Hx     Past Surgical History:  Procedure Laterality Date   ABDOMINAL HYSTERECTOMY      AXILLARY LYMPH NODE DISSECTION Right 04/17/2018   Procedure: AXILLARY LYMPH NODE DISSECTION;  Surgeon: Aron Shoulders, MD;  Location: [facility];  Service: General;  Laterality: Right;   BREAST EXCISIONAL BIOPSY Right 2007   BREAST LUMPECTOMY Right 2019   BREAST LUMPECTOMY WITH RADIOACTIVE SEED AND SENTINEL LYMPH NODE BIOPSY Right 04/05/2018   Procedure: RIGHT BREAST SEED BRACKETED LUMPECTOMY WITH SENTINEL LYMPH NODE BIOPSY;  Surgeon: Aron Shoulders, MD;  Location: [facility];  Service: General;  Laterality: Right;   BREAST SURGERY     bx, no cancer    CATARACT EXTRACTION Bilateral    COLONOSCOPY  09/2018   DILATATION & CURETTAGE/HYSTEROSCOPY WITH MYOSURE N/A 08/09/2017   Procedure: DILATATION & CURETTAGE/HYSTEROSCOPY WITH MYOSURE;  Surgeon: Starla Harland BROCKS, MD;  Location: [facility];  Service: Gynecology;  Laterality: N/A;   LABIOPLASTY  03/06/2018   Procedure: LABIAPLASTY;  Surgeon: Starla Harland BROCKS, MD;  Location: [facility];  Service: Gynecology;;   TOTAL KNEE ARTHROPLASTY Right 09/14/2020   Procedure: RIGHT TOTAL KNEE ARTHROPLASTY;  Surgeon: Jerri Kay HERO, MD;  Location: [facility];  Service: Orthopedics;  Laterality: Right;   TOTAL KNEE ARTHROPLASTY Left 04/22/2024   Procedure: ARTHROPLASTY, KNEE, TOTAL;  Surgeon: Jerri Kay HERO, MD;  Location: [facility];  Service: Orthopedics;  Laterality: Left;   UPPER GI ENDOSCOPY  09/2018   VAGINAL HYSTERECTOMY Bilateral 03/06/2018   Procedure: HYSTERECTOMY VAGINAL WITH BILATERAL SALPINGOOPHORECTOMY;  Surgeon: Starla Harland BROCKS, MD;  Location: [facility];  Service: Gynecology;  Laterality: Bilateral;   Social History   Occupational History   Occupation: house   Tobacco Use   Smoking status: Never   Smokeless tobacco: Never  Vaping Use   Vaping status: Never Used  Substance and Sexual Activity   Alcohol use: No   Drug use: No   Sexual activity: Not Currently    Birth control/protection: Surgical    Comment: Hysterectomy

## 2024-05-08 ENCOUNTER — Ambulatory Visit (HOSPITAL_BASED_OUTPATIENT_CLINIC_OR_DEPARTMENT_OTHER): Admitting: Family Medicine

## 2024-05-10 ENCOUNTER — Other Ambulatory Visit: Payer: Self-pay | Admitting: Hematology

## 2024-05-16 ENCOUNTER — Telehealth: Payer: Self-pay | Admitting: Orthopaedic Surgery

## 2024-05-16 ENCOUNTER — Other Ambulatory Visit: Payer: Self-pay | Admitting: Physician Assistant

## 2024-05-16 MED ORDER — TRAMADOL HCL 50 MG PO TABS: 50.0000 mg | ORAL_TABLET | Freq: Three times a day (TID) | ORAL | 2 refills | Status: DC | PRN

## 2024-05-16 NOTE — Telephone Encounter (Signed)
Called and notified patient's son

## 2024-05-16 NOTE — Telephone Encounter (Signed)
 Patient son called and said she can't do the Oxycodone . Can you send in Tramadol . She is still in pain. CB#[phone]

## 2024-05-16 NOTE — Telephone Encounter (Signed)
 sent

## 2024-05-28 NOTE — Therapy (Incomplete)
 OUTPATIENT PHYSICAL THERAPY LOWER EXTREMITY EVALUATION   Patient Name: Amy Roman MRN: [id number] DOB:[birth year], 73 y.o., female Today's Date: 05/28/2024  END OF SESSION:   Past Medical History:  Diagnosis Date   Anemia    Anxiety and depression 10/15/2014   Arthritis    BILATERAL KNEES, HAD CORTISONE INJECTION 03/2017   Breast cancer (HCC) 2019   Right Breast Cancer   Depression    GERD (gastroesophageal reflux disease)    OCCASIONAL WITH CERTAIN FOOD   Headache    patient denies this dx - occasional HA   Heart murmur 2021   pt denies this dx; in 2021, no mumur noted by Dr Patrisha   History of blood transfusion 02/2018   History of radiation therapy 05/28/18- 07/10/18   Right Breast 25 fractions for a total dose of 50 Gy, Right SCV and PAB 25 fractions for a total dose of 50 Gy, right breast boost, 5 fractions for a total dose of 10 Gy.    Personal history of radiation therapy    Right Breast Cancer   Seasonal allergies    SVD (spontaneous vaginal delivery)    x 4   Unspecified hereditary and idiopathic peripheral neuropathy 05/14/2014   LEFT KNEE AND FOOT/HANDS   Past Surgical History:  Procedure Laterality Date   ABDOMINAL HYSTERECTOMY     AXILLARY LYMPH NODE DISSECTION Right 04/17/2018   Procedure: AXILLARY LYMPH NODE DISSECTION;  Surgeon: Aron Shoulders, MD;  Location: [facility];  Service: General;  Laterality: Right;   BREAST EXCISIONAL BIOPSY Right 2007   BREAST LUMPECTOMY Right 2019   BREAST LUMPECTOMY WITH RADIOACTIVE SEED AND SENTINEL LYMPH NODE BIOPSY Right 04/05/2018   Procedure: RIGHT BREAST SEED BRACKETED LUMPECTOMY WITH SENTINEL LYMPH NODE BIOPSY;  Surgeon: Aron Shoulders, MD;  Location: [facility];  Service: General;  Laterality: Right;   BREAST SURGERY     bx, no cancer    CATARACT EXTRACTION Bilateral    COLONOSCOPY  09/2018   DILATATION & CURETTAGE/HYSTEROSCOPY WITH MYOSURE N/A 08/09/2017   Procedure: DILATATION  & CURETTAGE/HYSTEROSCOPY WITH MYOSURE;  Surgeon: Starla Harland BROCKS, MD;  Location: [facility];  Service: Gynecology;  Laterality: N/A;   LABIOPLASTY  03/06/2018   Procedure: LABIAPLASTY;  Surgeon: Starla Harland BROCKS, MD;  Location: [facility];  Service: Gynecology;;   TOTAL KNEE ARTHROPLASTY Right 09/14/2020   Procedure: RIGHT TOTAL KNEE ARTHROPLASTY;  Surgeon: Jerri Kay HERO, MD;  Location: [facility];  Service: Orthopedics;  Laterality: Right;   TOTAL KNEE ARTHROPLASTY Left 04/22/2024   Procedure: ARTHROPLASTY, KNEE, TOTAL;  Surgeon: Jerri Kay HERO, MD;  Location: [facility];  Service: Orthopedics;  Laterality: Left;   UPPER GI ENDOSCOPY  09/2018   VAGINAL HYSTERECTOMY Bilateral 03/06/2018   Procedure: HYSTERECTOMY VAGINAL WITH BILATERAL SALPINGOOPHORECTOMY;  Surgeon: Starla Harland BROCKS, MD;  Location: [facility];  Service: Gynecology;  Laterality: Bilateral;   Patient Active Problem List   Diagnosis Date Noted   Status post total left knee replacement 04/22/2024   Primary osteoarthritis of left knee 04/21/2021   Status post total knee replacement, right 09/14/2020   Primary osteoarthritis of right knee 09/13/2020   Iron deficiency anemia 07/23/2018   Breast cancer metastasized to axillary lymph node, right (HCC) 04/17/2018   Malignant neoplasm of 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, estrogen receptor positive (HCC) 03/11/2018   Post-operative state 03/06/2018   Uterine hyperplasia 10/11/2017   Anxiety and depression 10/15/2014   Vitamin B 12 deficiency 06/02/2014   Vitamin  D deficiency 06/02/2014   Hereditary and idiopathic peripheral neuropathy 05/14/2014    PCP: Ilah Crigler, MD   REFERRING PROVIDER: Jule Ronal LITTIE DEVONNA  REFERRING DIAG: [phone] (ICD-10-CM) - Status post total left knee replacement   THERAPY DIAG:  No diagnosis found.  Rationale for Evaluation and Treatment: Rehabilitation  ONSET DATE: 04/22/24, L TKR  SUBJECTIVE:   SUBJECTIVE STATEMENT: ***  PERTINENT HISTORY: *** PAIN:   Are you having pain? Yes: NPRS scale: *** Pain location: *** Pain description: *** Aggravating factors: *** Relieving factors: ***  PRECAUTIONS: {Therapy precautions:24002}  RED FLAGS: {PT Red Flags:29287}   WEIGHT BEARING RESTRICTIONS: {Yes ***/No:24003}  FALLS:  Has patient fallen in last 6 months? {fallsyesno:27318}  LIVING ENVIRONMENT: Lives with: {[facility] lives with:25569::lives with their family} Lives in: {Lives in:25570} Stairs: {opstairs:27293} Has following equipment at home: {Assistive devices:23999}  OCCUPATION: ***  PLOF: {PLOF:24004}  PATIENT GOALS: ***  NEXT MD VISIT: ***  OBJECTIVE:  Note: Objective measures were completed at Evaluation unless otherwise noted.  DIAGNOSTIC FINDINGS: ***  PATIENT SURVEYS:  {rehab surveys:24030}  COGNITION: Overall cognitive status: {cognition:24006}     SENSATION: {sensation:27233}  EDEMA:  {edema:24020}  MUSCLE LENGTH: Hamstrings: Right *** deg; Left *** deg Debby test: Right *** deg; Left *** deg  POSTURE: {posture:25561}  PALPATION: ***  LOWER EXTREMITY ROM:  {AROM/PROM:27142} ROM Right eval Left eval  Hip flexion    Hip extension    Hip abduction    Hip adduction    Hip internal rotation    Hip external rotation    Knee flexion    Knee extension    Ankle dorsiflexion    Ankle plantarflexion    Ankle inversion    Ankle eversion     (Blank rows = not tested)  LOWER EXTREMITY MMT:  MMT Right eval Left eval  Hip flexion    Hip extension    Hip abduction    Hip adduction    Hip internal rotation    Hip external rotation    Knee flexion    Knee extension    Ankle dorsiflexion    Ankle plantarflexion    Ankle inversion    Ankle eversion     (Blank rows = not tested)  LOWER EXTREMITY SPECIAL TESTS:  {LEspecialtests:26242}  FUNCTIONAL TESTS:  {Functional tests:24029}  GAIT: Distance walked: *** Assistive device utilized: {Assistive devices:23999} Level of assistance:  {Levels of assistance:24026} Comments: ***                                                                                                                                TREATMENT DATE:  [facility] Adult PT Treatment:                                                DATE: 05/30/24 Therapeutic Exercise: ***  Manual Therapy: *** Neuromuscular re-ed: *** Therapeutic Activity: *** Modalities: *** Self Care: ***     PATIENT EDUCATION:  Education details: *** Person educated: {Person educated:25204} Education method: {Education Method:25205} Education comprehension: {Education Comprehension:25206}  HOME EXERCISE PROGRAM: ***  ASSESSMENT:  CLINICAL IMPRESSION: Patient is a 73 y.o. female who was seen today for physical therapy evaluation and treatment for Z96.652 (ICD-10-CM) - Status post total left knee replacement .   OBJECTIVE IMPAIRMENTS: {opptimpairments:25111}.   ACTIVITY LIMITATIONS: {activitylimitations:27494}  PARTICIPATION LIMITATIONS: {participationrestrictions:25113}  PERSONAL FACTORS: {Personal factors:25162} are also affecting patient's functional outcome.   REHAB POTENTIAL: {rehabpotential:25112}  CLINICAL DECISION MAKING: {clinical decision making:25114}  EVALUATION COMPLEXITY: {Evaluation complexity:25115}   GOALS:  SHORT TERM GOALS: Target date: *** *** Baseline: Goal status: INITIAL  2.  *** Baseline:  Goal status: INITIAL  3.  *** Baseline:  Goal status: INITIAL  4.  *** Baseline:  Goal status: INITIAL  5.  *** Baseline:  Goal status: INITIAL  6.  *** Baseline:  Goal status: INITIAL  LONG TERM GOALS: Target date: ***  *** Baseline:  Goal status: INITIAL  2.  *** Baseline:  Goal status: INITIAL  3.  *** Baseline:  Goal status: INITIAL  4.  *** Baseline:  Goal status: INITIAL  5.  *** Baseline:  Goal status: INITIAL  6.  *** Baseline:  Goal status: INITIAL   PLAN:  PT FREQUENCY: {rehab frequency:25116}  PT  DURATION: {rehab duration:25117}  PLANNED INTERVENTIONS: {rehab planned interventions:25118::97110-Therapeutic exercises,97530- Therapeutic [phone]- Neuromuscular re-education,97535- Self Rjmz,02859- Manual therapy}  PLAN FOR NEXT SESSION: ***   Kimari Lienhard, PT 05/28/2024, 11:46 AM

## 2024-05-30 ENCOUNTER — Ambulatory Visit

## 2024-06-04 ENCOUNTER — Encounter: Admitting: Physician Assistant

## 2024-06-12 ENCOUNTER — Ambulatory Visit: Attending: Physician Assistant

## 2024-06-12 DIAGNOSIS — Z96652 Presence of left artificial knee joint: Secondary | ICD-10-CM | POA: Diagnosis not present

## 2024-06-12 DIAGNOSIS — M25562 Pain in left knee: Secondary | ICD-10-CM | POA: Insufficient documentation

## 2024-06-12 DIAGNOSIS — M6281 Muscle weakness (generalized): Secondary | ICD-10-CM | POA: Diagnosis present

## 2024-06-12 DIAGNOSIS — G8929 Other chronic pain: Secondary | ICD-10-CM | POA: Insufficient documentation

## 2024-06-12 DIAGNOSIS — R262 Difficulty in walking, not elsewhere classified: Secondary | ICD-10-CM | POA: Diagnosis present

## 2024-06-12 NOTE — Therapy (Signed)
 OUTPATIENT PHYSICAL THERAPY LOWER EXTREMITY EVALUATION   Patient Name: Amy Roman MRN: [id number] DOB:[birth year], 73 y.o., female Today's Date: 06/13/2024  END OF SESSION:  PT End of Session - 06/13/24 0628     Visit Number 1    Number of Visits 13    Date for PT Re-Evaluation 08/01/24    Authorization Type [employer] D[employer]PLETE    Authorization - Visit Number 1    Authorization - Number of Visits 27    PT Start Time 1500    PT Stop Time 1545    PT Time Calculation (min) 45 min          Past Medical History:  Diagnosis Date   Anemia    Anxiety and depression 10/15/2014   Arthritis    BILATERAL KNEES, HAD CORTISONE INJECTION 03/2017   Breast cancer (HCC) 2019   Right Breast Cancer   Depression    GERD (gastroesophageal reflux disease)    OCCASIONAL WITH CERTAIN FOOD   Headache    patient denies this dx - occasional HA   Heart murmur 2021   pt denies this dx; in 2021, no mumur noted by Dr Patrisha   History of blood transfusion 02/2018   History of radiation therapy 05/28/18- 07/10/18   Right Breast 25 fractions for a total dose of 50 Gy, Right SCV and PAB 25 fractions for a total dose of 50 Gy, right breast boost, 5 fractions for a total dose of 10 Gy.    Personal history of radiation therapy    Right Breast Cancer   Seasonal allergies    SVD (spontaneous vaginal delivery)    x 4   Unspecified hereditary and idiopathic peripheral neuropathy 05/14/2014   LEFT KNEE AND FOOT/HANDS   Past Surgical History:  Procedure Laterality Date   ABDOMINAL HYSTERECTOMY     AXILLARY LYMPH NODE DISSECTION Right 04/17/2018   Procedure: AXILLARY LYMPH NODE DISSECTION;  Surgeon: Aron Shoulders, MD;  Location: [facility];  Service: General;  Laterality: Right;   BREAST EXCISIONAL BIOPSY Right 2007   BREAST LUMPECTOMY Right 2019   BREAST LUMPECTOMY WITH RADIOACTIVE SEED AND SENTINEL LYMPH NODE BIOPSY Right 04/05/2018   Procedure: RIGHT BREAST SEED  BRACKETED LUMPECTOMY WITH SENTINEL LYMPH NODE BIOPSY;  Surgeon: Aron Shoulders, MD;  Location: [facility];  Service: General;  Laterality: Right;   BREAST SURGERY     bx, no cancer    CATARACT EXTRACTION Bilateral    COLONOSCOPY  09/2018   DILATATION & CURETTAGE/HYSTEROSCOPY WITH MYOSURE N/A 08/09/2017   Procedure: DILATATION & CURETTAGE/HYSTEROSCOPY WITH MYOSURE;  Surgeon: Starla Harland BROCKS, MD;  Location: [facility];  Service: Gynecology;  Laterality: N/A;   LABIOPLASTY  03/06/2018   Procedure: LABIAPLASTY;  Surgeon: Starla Harland BROCKS, MD;  Location: [facility];  Service: Gynecology;;   TOTAL KNEE ARTHROPLASTY Right 09/14/2020   Procedure: RIGHT TOTAL KNEE ARTHROPLASTY;  Surgeon: Jerri Kay HERO, MD;  Location: [facility];  Service: Orthopedics;  Laterality: Right;   TOTAL KNEE ARTHROPLASTY Left 04/22/2024   Procedure: ARTHROPLASTY, KNEE, TOTAL;  Surgeon: Jerri Kay HERO, MD;  Location: [facility];  Service: Orthopedics;  Laterality: Left;   UPPER GI ENDOSCOPY  09/2018   VAGINAL HYSTERECTOMY Bilateral 03/06/2018   Procedure: HYSTERECTOMY VAGINAL WITH BILATERAL SALPINGOOPHORECTOMY;  Surgeon: Starla Harland BROCKS, MD;  Location: [facility];  Service: Gynecology;  Laterality: Bilateral;   Patient Active Problem List   Diagnosis Date Noted   Status post total left knee replacement 04/22/2024  Primary osteoarthritis of left knee 04/21/2021   Status post total knee replacement, right 09/14/2020   Primary osteoarthritis of right knee 09/13/2020   Iron deficiency anemia 07/23/2018   Breast cancer metastasized to axillary lymph node, right (HCC) 04/17/2018   Malignant neoplasm of 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, estrogen receptor positive (HCC) 03/11/2018   Post-operative state 03/06/2018   Uterine hyperplasia 10/11/2017   Anxiety and depression 10/15/2014   Vitamin B 12 deficiency 06/02/2014   Vitamin D  deficiency 06/02/2014   Hereditary and idiopathic peripheral neuropathy 05/14/2014    PCP: Ilah Crigler, MD   REFERRING PROVIDER: Jule Ronal LITTIE DEVONNA  REFERRING DIAG: [phone] (ICD-10-CM) - Status post total left knee replacement   THERAPY DIAG:  Chronic pain of left knee  Muscle weakness (generalized)  Difficulty in walking, not elsewhere classified  Rationale for Evaluation and Treatment: Rehabilitation  ONSET DATE: 04/22/24, L TKR  SUBJECTIVE:   SUBJECTIVE STATEMENT: Pt reports her L knee is improving. Pt states she received HHPT for 5 visits initially after her surgery. Pt reports the edema of her legs is genetic.  PERTINENT HISTORY: Marked edema both LEs  PAIN:  Are you having pain? Yes: NPRS scale: 7/10 Pain location: L knee Pain description: ache Aggravating factors: Increased time on feet or activity level Relieving factors: Rest  PRECAUTIONS: None  RED FLAGS: None   WEIGHT BEARING RESTRICTIONS: No  FALLS:  Has patient fallen in last 6 months? No  LIVING ENVIRONMENT: Lives with: lives alone Lives in: House/apartment Able to access home which has 3 steps and a handrail  OCCUPATION: Retired  PLOF: Independent  PATIENT GOALS: Decreased pain, increased strength, to have better use of my L leg  NEXT MD VISIT: 06/21/24  OBJECTIVE:  Note: Objective measures were completed at Evaluation unless otherwise noted.  PATIENT SURVEYS:  LEFS: 22/80=28%  COGNITION: Overall cognitive status: Within functional limits for tasks assessed     SENSATION: WFL  EDEMA:  Marked edema both legs  POSTURE: No Significant postural limitations  PALPATION: TTP L peri-knee  LOWER EXTREMITY ROM:  Active ROM Right eval Left eval  Hip flexion    Hip extension    Hip abduction    Hip adduction    Hip internal rotation    Hip external rotation    Knee flexion  100  Knee extension  0  Ankle dorsiflexion    Ankle plantarflexion    Ankle inversion    Ankle eversion     (Blank rows = not tested)  LOWER EXTREMITY MMT:  MMT Right eval Left eval  Hip  flexion Completes SLR Unable to compete a SLR  Hip extension    Hip abduction    Hip adduction    Hip internal rotation    Hip external rotation    Knee flexion completes Needs strap assist to complete  Knee extension    Ankle dorsiflexion    Ankle plantarflexion    Ankle inversion    Ankle eversion     (Blank rows = not tested)  FUNCTIONAL TESTS:  5 times sit to stand: TBA 2 minute walk test: TBA  GAIT: Distance walked: 100' Assistive device utilized: None Level of assistance: Complete Independence Comments: Decreased pace and lateral tilting gait pattern due to marked edema of both LEs  TREATMENT DATE:  [facility] Adult PT Treatment:                                                DATE: 05/30/24 Therapeutic Exercise: Developed, instructed in, and pt completed therex as noted in HEP  Self Care: Use of cold pack and elevation for 10-15 mins as needed for pain and swelling   PATIENT EDUCATION:  Education details: Eval findings, POC, HEP, self care  Person educated: Patient Education method: Explanation, Demonstration, Tactile cues, Verbal cues, and Handouts Education comprehension: verbalized understanding, returned demonstration, verbal cues required, and tactile cues required  HOME EXERCISE PROGRAM: Access Code: [email or web address] URL: [email or web address] Date: 06/12/2024 Prepared by: Dasie Daft  Exercises - Active Straight Leg Raise with Quad Set  - 2 x daily - 7 x weekly - 1 sets - 10 reps - Supine Heel Slide with Strap  - 2 x daily - 7 x weekly - 1 sets - 10 reps - 10 hold - Seated Long Arc Quad  - 2 x daily - 7 x weekly - 3 sets - 10 reps - 3 hold - Standing Hip Abduction with Counter Support  - 2 x daily - 7 x weekly - 3 sets - 10 reps - 3 hold  ASSESSMENT:  CLINICAL IMPRESSION: Patient is a 73 y.o. female who was seen today for physical therapy  evaluation and treatment for Z96.652 (ICD-10-CM) - Status post total left knee replacement.   OBJECTIVE IMPAIRMENTS: decreased activity tolerance, decreased balance, decreased coordination, difficulty walking, decreased ROM, decreased strength, increased edema, and pain.   ACTIVITY LIMITATIONS: carrying, lifting, bending, standing, squatting, stairs, bed mobility, bathing, dressing, hygiene/grooming, and locomotion level  PARTICIPATION LIMITATIONS: meal prep, cleaning, laundry, shopping, and community activity  PERSONAL FACTORS: Past/current experiences, Time since onset of injury/illness/exacerbation, and 1 comorbidity: marked edema both LEs are also affecting patient's functional outcome.   REHAB POTENTIAL: Good  CLINICAL DECISION MAKING: Evolving/moderate complexity  EVALUATION COMPLEXITY: Moderate   GOALS:  SHORT TERM GOALS = LTGs  LONG TERM GOALS: Target date: 08/01/24  Pt will be Ind in a final HEP to maintain achieved LOF  Baseline: started Goal status: INITIAL  2.  Increase L knee flexion AROM to 110d for improved knee function with sit to stand, steps, and ambulation Baseline: 100d Goal status: INITIAL  3.  Increase L knee/hip strength to pt being able to complete a SLR for improved function with ambulation, asc/dsc steps, for lifting her L LE into bed Baseline: Unable to complete Goal status: INITIAL  4.  Pt will report improvement in L knee pain for improved function and QOL Baseline: 7/10 Goal status: INITIAL  5.  Improve 5xSTS by MCID of 5 and by MCID of 81ft as indication of improved functional mobility  Baseline: TBA Goal status: INITIAL  6.  Pt's FOTO score will improve by the MCID to 43% as indication of improved function  Baseline: 28% Goal status: INITIAL   PLAN:  PT FREQUENCY: 2x/week  PT DURATION: 6 weeks  PLANNED INTERVENTIONS: 97164- PT Re-evaluation, 97110-Therapeutic exercises, 97530- Therapeutic activity, 97112- Neuromuscular  re-education, 97535- Self Care, 02859- Manual therapy, [phone]- Gait training, Patient/Family education, Balance training, Stair training, Taping, Joint mobilization, Scar mobilization, Cryotherapy, and Moist heat  PLAN FOR NEXT SESSION: Assess 5xSTS and ; assess response to HEP; progress therex as indicated; use of modalities, manual  therapy; and TPDN as indicated.   Hailley Byers MS, PT 06/14/24 1:10 PM

## 2024-06-13 ENCOUNTER — Other Ambulatory Visit: Payer: Self-pay

## 2024-06-17 ENCOUNTER — Encounter: Payer: Self-pay | Admitting: Physical Therapy

## 2024-06-17 ENCOUNTER — Ambulatory Visit: Admitting: Physical Therapy

## 2024-06-17 DIAGNOSIS — G8929 Other chronic pain: Secondary | ICD-10-CM

## 2024-06-17 DIAGNOSIS — M25562 Pain in left knee: Secondary | ICD-10-CM | POA: Diagnosis not present

## 2024-06-17 DIAGNOSIS — M6281 Muscle weakness (generalized): Secondary | ICD-10-CM

## 2024-06-17 DIAGNOSIS — R262 Difficulty in walking, not elsewhere classified: Secondary | ICD-10-CM

## 2024-06-17 NOTE — Therapy (Signed)
 OUTPATIENT PHYSICAL THERAPY LOWER EXTREMITY TREATMENT   Patient Name: Amy Roman MRN: [id number] DOB:1951-10-19, 73 y.o., female Today's Date: 06/17/2024  END OF SESSION:  PT End of Session - 06/17/24 1327     Visit Number 2    Number of Visits 13    Date for PT Re-Evaluation 08/01/24    Authorization Type [employer] D[employer]PLETE    Authorization - Visit Number 2    Authorization - Number of Visits 27    PT Start Time 1325    PT Stop Time 1403    PT Time Calculation (min) 38 min          Past Medical History:  Diagnosis Date   Anemia    Anxiety and depression 10/15/2014   Arthritis    BILATERAL KNEES, HAD CORTISONE INJECTION 03/2017   Breast cancer (HCC) 2019   Right Breast Cancer   Depression    GERD (gastroesophageal reflux disease)    OCCASIONAL WITH CERTAIN FOOD   Headache    patient denies this dx - occasional HA   Heart murmur 2021   pt denies this dx; in 2021, no mumur noted by Dr Patrisha   History of blood transfusion 02/2018   History of radiation therapy 05/28/18- 07/10/18   Right Breast 25 fractions for a total dose of 50 Gy, Right SCV and PAB 25 fractions for a total dose of 50 Gy, right breast boost, 5 fractions for a total dose of 10 Gy.    Personal history of radiation therapy    Right Breast Cancer   Seasonal allergies    SVD (spontaneous vaginal delivery)    x 4   Unspecified hereditary and idiopathic peripheral neuropathy 05/14/2014   LEFT KNEE AND FOOT/HANDS   Past Surgical History:  Procedure Laterality Date   ABDOMINAL HYSTERECTOMY     AXILLARY LYMPH NODE DISSECTION Right 04/17/2018   Procedure: AXILLARY LYMPH NODE DISSECTION;  Surgeon: Aron Shoulders, MD;  Location: [facility];  Service: General;  Laterality: Right;   BREAST EXCISIONAL BIOPSY Right 2007   BREAST LUMPECTOMY Right 2019   BREAST LUMPECTOMY WITH RADIOACTIVE SEED AND SENTINEL LYMPH NODE BIOPSY Right 04/05/2018   Procedure: RIGHT BREAST SEED  BRACKETED LUMPECTOMY WITH SENTINEL LYMPH NODE BIOPSY;  Surgeon: Aron Shoulders, MD;  Location: [facility];  Service: General;  Laterality: Right;   BREAST SURGERY     bx, no cancer    CATARACT EXTRACTION Bilateral    COLONOSCOPY  09/2018   DILATATION & CURETTAGE/HYSTEROSCOPY WITH MYOSURE N/A 08/09/2017   Procedure: DILATATION & CURETTAGE/HYSTEROSCOPY WITH MYOSURE;  Surgeon: Starla Harland BROCKS, MD;  Location: [facility];  Service: Gynecology;  Laterality: N/A;   LABIOPLASTY  03/06/2018   Procedure: LABIAPLASTY;  Surgeon: Starla Harland BROCKS, MD;  Location: [facility];  Service: Gynecology;;   TOTAL KNEE ARTHROPLASTY Right 09/14/2020   Procedure: RIGHT TOTAL KNEE ARTHROPLASTY;  Surgeon: Jerri Kay HERO, MD;  Location: [facility];  Service: Orthopedics;  Laterality: Right;   TOTAL KNEE ARTHROPLASTY Left 04/22/2024   Procedure: ARTHROPLASTY, KNEE, TOTAL;  Surgeon: Jerri Kay HERO, MD;  Location: [facility];  Service: Orthopedics;  Laterality: Left;   UPPER GI ENDOSCOPY  09/2018   VAGINAL HYSTERECTOMY Bilateral 03/06/2018   Procedure: HYSTERECTOMY VAGINAL WITH BILATERAL SALPINGOOPHORECTOMY;  Surgeon: Starla Harland BROCKS, MD;  Location: [facility];  Service: Gynecology;  Laterality: Bilateral;   Patient Active Problem List   Diagnosis Date Noted   Status post total left knee replacement 04/22/2024  Primary osteoarthritis of left knee 04/21/2021   Status post total knee replacement, right 09/14/2020   Primary osteoarthritis of right knee 09/13/2020   Iron deficiency anemia 07/23/2018   Breast cancer metastasized to axillary lymph node, right (HCC) 04/17/2018   Malignant neoplasm of 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, estrogen receptor positive (HCC) 03/11/2018   Post-operative state 03/06/2018   Uterine hyperplasia 10/11/2017   Anxiety and depression 10/15/2014   Vitamin B 12 deficiency 06/02/2014   Vitamin D  deficiency 06/02/2014   Hereditary and idiopathic peripheral neuropathy 05/14/2014    PCP: Ilah Crigler, MD   REFERRING PROVIDER: Jule Ronal LITTIE DEVONNA  REFERRING DIAG: [phone] (ICD-10-CM) - Status post total left knee replacement   THERAPY DIAG:  Chronic pain of left knee  Muscle weakness (generalized)  Difficulty in walking, not elsewhere classified  Rationale for Evaluation and Treatment: Rehabilitation  ONSET DATE: 04/22/24, L TKR  SUBJECTIVE:   SUBJECTIVE STATEMENT: 4/10 pain. I cannot find my cane but I want to depend only on myself.   EVAL: Pt reports her L knee is improving. Pt states she received HHPT for 5 visits initially after her surgery. Pt reports the edema of her legs is genetic.  PERTINENT HISTORY: Marked edema both LEs  PAIN:  Are you having pain? Yes: NPRS scale: 7/10 Pain location: L knee Pain description: ache Aggravating factors: Increased time on feet or activity level Relieving factors: Rest  PRECAUTIONS: None  RED FLAGS: None   WEIGHT BEARING RESTRICTIONS: No  FALLS:  Has patient fallen in last 6 months? No  LIVING ENVIRONMENT: Lives with: lives alone Lives in: House/apartment Able to access home which has 3 steps and a handrail  OCCUPATION: Retired  PLOF: Independent  PATIENT GOALS: Decreased pain, increased strength, to have better use of my L leg  NEXT MD VISIT: 06/21/24  OBJECTIVE:  Note: Objective measures were completed at Evaluation unless otherwise noted.  PATIENT SURVEYS:  LEFS: 22/80=28%  COGNITION: Overall cognitive status: Within functional limits for tasks assessed     SENSATION: WFL  EDEMA:  Marked edema both legs  POSTURE: No Significant postural limitations  PALPATION: TTP L peri-knee  LOWER EXTREMITY ROM:  Active ROM Right eval Left eval  Hip flexion    Hip extension    Hip abduction    Hip adduction    Hip internal rotation    Hip external rotation    Knee flexion  100  Knee extension  0  Ankle dorsiflexion    Ankle plantarflexion    Ankle inversion    Ankle eversion     (Blank  rows = not tested)  LOWER EXTREMITY MMT:  MMT Right eval Left eval  Hip flexion Completes SLR Unable to compete a SLR  Hip extension    Hip abduction    Hip adduction    Hip internal rotation    Hip external rotation    Knee flexion completes Needs strap assist to complete  Knee extension    Ankle dorsiflexion    Ankle plantarflexion    Ankle inversion    Ankle eversion     (Blank rows = not tested)  FUNCTIONAL TESTS:  5 times sit to stand: TBA 2 minute walk test: TBA 06/17/24 5 x STS   18.1 sec, mat table, without UE 06/17/24: 2 MWT 167 feet with SPC  GAIT: Distance walked: 100' Assistive device utilized: None Level of assistance: Complete Independence Comments: Decreased pace and lateral tilting gait pattern due to marked edema  of both LEs                                                                                                                        TREATMENT DATE:  [facility] Adult PT Treatment:                                                DATE: 06/17/24 Therapeutic Exercise: HL ball squeeze 10 x 2  LAQ 3# 10 x 3  Therapeutic Activity: 5 x STS   18.1 sec, mat table, without UE 2 MWT 167 feet with SPC Nustep L3 UE/LE x 5 minutes Assisted heel slides x 15 SAQ AROM 20 x 2  Assisted SLR x 10       [facility] Adult PT Treatment:                                                DATE: 05/30/24 Therapeutic Exercise: Developed, instructed in, and pt completed therex as noted in HEP  Self Care: Use of cold pack and elevation for 10-15 mins as needed for pain and swelling   PATIENT EDUCATION:  Education details: Eval findings, POC, HEP, self care  Person educated: Patient Education method: Explanation, Demonstration, Tactile cues, Verbal cues, and Handouts Education comprehension: verbalized understanding, returned demonstration, verbal cues required, and tactile cues required  HOME EXERCISE PROGRAM: Access Code: [email or web address] URL: [email or web address] Date:  06/12/2024 Prepared by: Dasie Daft  Exercises - Active Straight Leg Raise with Quad Set  - 2 x daily - 7 x weekly - 1 sets - 10 reps - Supine Heel Slide with Strap  - 2 x daily - 7 x weekly - 1 sets - 10 reps - 10 hold - Seated Long Arc Quad  - 2 x daily - 7 x weekly - 3 sets - 10 reps - 3 hold - Standing Hip Abduction with Counter Support  - 2 x daily - 7 x weekly - 3 sets - 10 reps - 3 hold  ASSESSMENT:  CLINICAL IMPRESSION: Pt arrives with slow antalgic gait pattern, no AD. Fit for clinic [facility] and completed 2 MWT at 167 feet. Improved gait pattern with SPC. Pt to try to find her cane. Reviewed HEP, pt needs assist with SLR. Able to add resist to LAQ. Fatigues quickly with standing hip. Will continued to progress as able.    Patient is a 73 y.o. female who was seen today for physical therapy evaluation and treatment for Z96.652 (ICD-10-CM) - Status post total left knee replacement.   OBJECTIVE IMPAIRMENTS: decreased activity tolerance, decreased balance, decreased coordination, difficulty walking, decreased ROM, decreased strength, increased edema, and pain.   ACTIVITY LIMITATIONS: carrying, lifting, bending, standing, squatting, stairs, bed mobility, bathing,  dressing, hygiene/grooming, and locomotion level  PARTICIPATION LIMITATIONS: meal prep, cleaning, laundry, shopping, and community activity  PERSONAL FACTORS: Past/current experiences, Time since onset of injury/illness/exacerbation, and 1 comorbidity: marked edema both LEs are also affecting patient's functional outcome.   REHAB POTENTIAL: Good  CLINICAL DECISION MAKING: Evolving/moderate complexity  EVALUATION COMPLEXITY: Moderate   GOALS:  SHORT TERM GOALS = LTGs  LONG TERM GOALS: Target date: 08/01/24  Pt will be Ind in a final HEP to maintain achieved LOF  Baseline: started Goal status: INITIAL  2.  Increase L knee flexion AROM to 110d for improved knee function with sit to stand, steps, and ambulation Baseline:  100d Goal status: INITIAL  3.  Increase L knee/hip strength to pt being able to complete a SLR for improved function with ambulation, asc/dsc steps, for lifting her L LE into bed Baseline: Unable to complete Goal status: INITIAL  4.  Pt will report improvement in L knee pain for improved function and QOL Baseline: 7/10 Goal status: INITIAL  5.  Improve 5xSTS by MCID of 5 and by MCID of 3ft as indication of improved functional mobility  Baseline: TBA 06/17/24: 18.1 sec ; 2 MWT 167 feet with SPC Goal status: INITIAL  6.  Pt's LEFS score will improve by the MCID to 43% as indication of improved function  Baseline: 28% Goal status: INITIAL   PLAN:  PT FREQUENCY: 2x/week  PT DURATION: 6 weeks  PLANNED INTERVENTIONS: 97164- PT Re-evaluation, 97110-Therapeutic exercises, 97530- Therapeutic activity, 97112- Neuromuscular re-education, 97535- Self Care, 02859- Manual therapy, [phone]- Gait training, Patient/Family education, Balance training, Stair training, Taping, Joint mobilization, Scar mobilization, Cryotherapy, and Moist heat  PLAN FOR NEXT SESSION: assess response to HEP; progress therex as indicated; use of modalities, manual therapy; and TPDN as indicated.   Harlene Persons, PTA 06/17/24 2:42 PM Phone: [phone] Fax: [phone]

## 2024-06-18 NOTE — Therapy (Incomplete)
 OUTPATIENT PHYSICAL THERAPY LOWER EXTREMITY TREATMENT   Patient Name: Amy Roman MRN: [id number] DOB:1951-02-06, 73 y.o., female Today's Date: 06/18/2024  END OF SESSION:    Past Medical History:  Diagnosis Date   Anemia    Anxiety and depression 10/15/2014   Arthritis    BILATERAL KNEES, HAD CORTISONE INJECTION 03/2017   Breast cancer (HCC) 2019   Right Breast Cancer   Depression    GERD (gastroesophageal reflux disease)    OCCASIONAL WITH CERTAIN FOOD   Headache    patient denies this dx - occasional HA   Heart murmur 2021   pt denies this dx; in 2021, no mumur noted by Dr Patrisha   History of blood transfusion 02/2018   History of radiation therapy 05/28/18- 07/10/18   Right Breast 25 fractions for a total dose of 50 Gy, Right SCV and PAB 25 fractions for a total dose of 50 Gy, right breast boost, 5 fractions for a total dose of 10 Gy.    Personal history of radiation therapy    Right Breast Cancer   Seasonal allergies    SVD (spontaneous vaginal delivery)    x 4   Unspecified hereditary and idiopathic peripheral neuropathy 05/14/2014   LEFT KNEE AND FOOT/HANDS   Past Surgical History:  Procedure Laterality Date   ABDOMINAL HYSTERECTOMY     AXILLARY LYMPH NODE DISSECTION Right 04/17/2018   Procedure: AXILLARY LYMPH NODE DISSECTION;  Surgeon: Aron Shoulders, MD;  Location: [facility];  Service: General;  Laterality: Right;   BREAST EXCISIONAL BIOPSY Right 2007   BREAST LUMPECTOMY Right 2019   BREAST LUMPECTOMY WITH RADIOACTIVE SEED AND SENTINEL LYMPH NODE BIOPSY Right 04/05/2018   Procedure: RIGHT BREAST SEED BRACKETED LUMPECTOMY WITH SENTINEL LYMPH NODE BIOPSY;  Surgeon: Aron Shoulders, MD;  Location: [facility];  Service: General;  Laterality: Right;   BREAST SURGERY     bx, no cancer    CATARACT EXTRACTION Bilateral    COLONOSCOPY  09/2018   DILATATION & CURETTAGE/HYSTEROSCOPY WITH MYOSURE N/A 08/09/2017   Procedure:  DILATATION & CURETTAGE/HYSTEROSCOPY WITH MYOSURE;  Surgeon: Starla Harland BROCKS, MD;  Location: [facility];  Service: Gynecology;  Laterality: N/A;   LABIOPLASTY  03/06/2018   Procedure: LABIAPLASTY;  Surgeon: Starla Harland BROCKS, MD;  Location: [facility];  Service: Gynecology;;   TOTAL KNEE ARTHROPLASTY Right 09/14/2020   Procedure: RIGHT TOTAL KNEE ARTHROPLASTY;  Surgeon: Jerri Kay HERO, MD;  Location: [facility];  Service: Orthopedics;  Laterality: Right;   TOTAL KNEE ARTHROPLASTY Left 04/22/2024   Procedure: ARTHROPLASTY, KNEE, TOTAL;  Surgeon: Jerri Kay HERO, MD;  Location: [facility];  Service: Orthopedics;  Laterality: Left;   UPPER GI ENDOSCOPY  09/2018   VAGINAL HYSTERECTOMY Bilateral 03/06/2018   Procedure: HYSTERECTOMY VAGINAL WITH BILATERAL SALPINGOOPHORECTOMY;  Surgeon: Starla Harland BROCKS, MD;  Location: [facility];  Service: Gynecology;  Laterality: Bilateral;   Patient Active Problem List   Diagnosis Date Noted   Status post total left knee replacement 04/22/2024   Primary osteoarthritis of left knee 04/21/2021   Status post total knee replacement, right 09/14/2020   Primary osteoarthritis of right knee 09/13/2020   Iron deficiency anemia 07/23/2018   Breast cancer metastasized to axillary lymph node, right (HCC) 04/17/2018   Malignant neoplasm of 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, estrogen receptor positive (HCC) 03/11/2018   Post-operative state 03/06/2018   Uterine hyperplasia 10/11/2017   Anxiety and depression 10/15/2014   Vitamin B 12 deficiency 06/02/2014  Vitamin D  deficiency 06/02/2014   Hereditary and idiopathic peripheral neuropathy 05/14/2014    PCP: Ilah Crigler, MD   REFERRING PROVIDER: Jule Ronal LITTIE DEVONNA  REFERRING DIAG: [id number] (ICD-10-CM) - Status post total left knee replacement   THERAPY DIAG:  No diagnosis found.  Rationale for Evaluation and Treatment: Rehabilitation  ONSET DATE: 04/22/24, L TKR  SUBJECTIVE:   SUBJECTIVE STATEMENT: 4/10 pain. I cannot find my  cane but I want to depend only on myself.   EVAL: Pt reports her L knee is improving. Pt states she received HHPT for 5 visits initially after her surgery. Pt reports the edema of her legs is genetic.  PERTINENT HISTORY: Marked edema both LEs  PAIN:  Are you having pain? Yes: NPRS scale: 7/10 Pain location: L knee Pain description: ache Aggravating factors: Increased time on feet or activity level Relieving factors: Rest  PRECAUTIONS: None  RED FLAGS: None   WEIGHT BEARING RESTRICTIONS: No  FALLS:  Has patient fallen in last 6 months? No  LIVING ENVIRONMENT: Lives with: lives alone Lives in: House/apartment Able to access home which has 3 steps and a handrail  OCCUPATION: Retired  PLOF: Independent  PATIENT GOALS: Decreased pain, increased strength, to have better use of my L leg  NEXT MD VISIT: 06/21/24  OBJECTIVE:  Note: Objective measures were completed at Evaluation unless otherwise noted.  PATIENT SURVEYS:  LEFS: 22/80=28%  COGNITION: Overall cognitive status: Within functional limits for tasks assessed     SENSATION: WFL  EDEMA:  Marked edema both legs  POSTURE: No Significant postural limitations  PALPATION: TTP L peri-knee  LOWER EXTREMITY ROM:  Active ROM Right eval Left eval  Hip flexion    Hip extension    Hip abduction    Hip adduction    Hip internal rotation    Hip external rotation    Knee flexion  100  Knee extension  0  Ankle dorsiflexion    Ankle plantarflexion    Ankle inversion    Ankle eversion     (Blank rows = not tested)  LOWER EXTREMITY MMT:  MMT Right eval Left eval  Hip flexion Completes SLR Unable to compete a SLR  Hip extension    Hip abduction    Hip adduction    Hip internal rotation    Hip external rotation    Knee flexion completes Needs strap assist to complete  Knee extension    Ankle dorsiflexion    Ankle plantarflexion    Ankle inversion    Ankle eversion     (Blank rows = not  tested)  FUNCTIONAL TESTS:  5 times sit to stand: TBA 2 minute walk test: TBA 06/17/24 5 x STS   18.1 sec, mat table, without UE 06/17/24: 2 MWT 167 feet with SPC  GAIT: Distance walked: 100' Assistive device utilized: None Level of assistance: Complete Independence Comments: Decreased pace and lateral tilting gait pattern due to marked edema of both LEs  TREATMENT DATE:  [facility] Treatment:                                                DATE: 06/19/24 Therapeutic Exercise: HL ball squeeze 10 x 2  LAQ 3# 10 x 3 Therapeutic Activity: 5 x STS   18.1 sec, mat table, without UE 2 MWT 167 feet with SPC Nustep L3 UE/LE x 5 minutes Assisted heel slides x 15 SAQ AROM 20 x 2  Assisted SLR x 10  Therapeutic Exercise: *** Manual Therapy: *** Neuromuscular re-ed: *** Therapeutic Activity: *** Modalities: *** Self Care: ***   [facility] Adult PT Treatment:                                                DATE: 06/17/24 Therapeutic Exercise: HL ball squeeze 10 x 2  LAQ 3# 10 x 3  Therapeutic Activity: 5 x STS   18.1 sec, mat table, without UE 2 MWT 167 feet with SPC Nustep L3 UE/LE x 5 minutes Assisted heel slides x 15 SAQ AROM 20 x 2  Assisted SLR x 10   [facility] Adult PT Treatment:                                                DATE: 05/30/24 Therapeutic Exercise: Developed, instructed in, and pt completed therex as noted in HEP  Self Care: Use of cold pack and elevation for 10-15 mins as needed for pain and swelling   PATIENT EDUCATION:  Education details: Eval findings, POC, HEP, self care  Person educated: Patient Education method: Explanation, Demonstration, Tactile cues, Verbal cues, and Handouts Education comprehension: verbalized understanding, returned demonstration, verbal cues required, and tactile cues required  HOME EXERCISE PROGRAM: Access Code:  [email or web address] URL: [email or web address] Date: 06/12/2024 Prepared by: Dasie Daft  Exercises - Active Straight Leg Raise with Quad Set  - 2 x daily - 7 x weekly - 1 sets - 10 reps - Supine Heel Slide with Strap  - 2 x daily - 7 x weekly - 1 sets - 10 reps - 10 hold - Seated Long Arc Quad  - 2 x daily - 7 x weekly - 3 sets - 10 reps - 3 hold - Standing Hip Abduction with Counter Support  - 2 x daily - 7 x weekly - 3 sets - 10 reps - 3 hold  ASSESSMENT:  CLINICAL IMPRESSION: Pt arrives with slow antalgic gait pattern, no AD. Fit for clinic [facility] and completed 2 MWT at 167 feet. Improved gait pattern with SPC. Pt to try to find her cane. Reviewed HEP, pt needs assist with SLR. Able to add resist to LAQ. Fatigues quickly with standing hip. Will continued to progress as able.    Patient is a 74 y.o. female who was seen today for physical therapy evaluation and treatment for Z96.652 (ICD-10-CM) - Status post total left knee replacement.   OBJECTIVE IMPAIRMENTS: decreased activity tolerance, decreased balance, decreased coordination, difficulty walking, decreased ROM, decreased strength, increased edema, and pain.   ACTIVITY LIMITATIONS: carrying, lifting, bending, standing, squatting, stairs, bed mobility,  bathing, dressing, hygiene/grooming, and locomotion level  PARTICIPATION LIMITATIONS: meal prep, cleaning, laundry, shopping, and community activity  PERSONAL FACTORS: Past/current experiences, Time since onset of injury/illness/exacerbation, and 1 comorbidity: marked edema both LEs are also affecting patient's functional outcome.   REHAB POTENTIAL: Good  CLINICAL DECISION MAKING: Evolving/moderate complexity  EVALUATION COMPLEXITY: Moderate   GOALS:  SHORT TERM GOALS = LTGs  LONG TERM GOALS: Target date: 08/01/24  Pt will be Ind in a final HEP to maintain achieved LOF  Baseline: started Goal status: INITIAL  2.  Increase L knee flexion AROM to 110d for improved knee  function with sit to stand, steps, and ambulation Baseline: 100d Goal status: INITIAL  3.  Increase L knee/hip strength to pt being able to complete a SLR for improved function with ambulation, asc/dsc steps, for lifting her L LE into bed Baseline: Unable to complete Goal status: INITIAL  4.  Pt will report improvement in L knee pain for improved function and QOL Baseline: 7/10 Goal status: INITIAL  5.  Improve 5xSTS by MCID of 5 and by MCID of 22ft as indication of improved functional mobility  Baseline: TBA 06/17/24: 18.1 sec ; 2 MWT 167 feet with SPC Goal status: INITIAL  6.  Pt's LEFS score will improve by the MCID to 43% as indication of improved function  Baseline: 28% Goal status: INITIAL   PLAN:  PT FREQUENCY: 2x/week  PT DURATION: 6 weeks  PLANNED INTERVENTIONS: 97164- PT Re-evaluation, 97110-Therapeutic exercises, 97530- Therapeutic activity, 97112- Neuromuscular re-education, 97535- Self Care, [id number]- Manual therapy, [phone]- Gait training, Patient/Family education, Balance training, Stair training, Taping, Joint mobilization, Scar mobilization, Cryotherapy, and Moist heat  PLAN FOR NEXT SESSION: assess response to HEP; progress therex as indicated; use of modalities, manual therapy; and TPDN as indicated.   Mikella Linsley MS, PT 06/18/24 2:25 PM

## 2024-06-19 ENCOUNTER — Ambulatory Visit

## 2024-06-21 ENCOUNTER — Ambulatory Visit (INDEPENDENT_AMBULATORY_CARE_PROVIDER_SITE_OTHER): Admitting: Physician Assistant

## 2024-06-21 ENCOUNTER — Other Ambulatory Visit (INDEPENDENT_AMBULATORY_CARE_PROVIDER_SITE_OTHER): Payer: Self-pay

## 2024-06-21 DIAGNOSIS — Z96652 Presence of left artificial knee joint: Secondary | ICD-10-CM

## 2024-06-21 NOTE — Progress Notes (Signed)
 Post-Op Visit Note   Patient: Amy Roman           Date of Birth: 1951-07-25           MRN: [id number] Visit Date: 06/21/2024 PCP: Ilah Crigler, MD   Assessment & Plan:  Chief Complaint:  Chief Complaint  Patient presents with   Left Knee - Routine Post Op   Visit Diagnoses:  1. Status post total left knee replacement     Plan: Patient is a pleasant 73 year old female who comes in today 8 weeks status post left total knee replacement 04/22/2024.  She is doing okay.  She is not in any terrible pain but does note she is just sore.  Not taking any medication for this.  She did however just recently start physical therapy as she was previously experiencing pain.  She has not been taking a baby aspirin  for DVT prophylaxis.  Examination of the left knee reveals a fully healed surgical scar without complication.  Range of motion 0 to 100 degrees.  She is stable to valgus varus stress.  She is neurovascularly intact distally.  At this point, she will continue with outpatient physical therapy.  Dental prophylaxis reinforced.  Follow-up in 4 weeks for recheck.  Call with concerns or questions.  Follow-Up Instructions: Return in about 4 weeks (around 07/19/2024).   Orders:  Orders Placed This Encounter  Procedures   XR Knee 1-2 Views Left   No orders of the defined types were placed in this encounter.   Imaging: XR Knee 1-2 Views Left Result Date: 06/21/2024 Well-seated prosthesis without complication   PMFS History: Patient Active Problem List   Diagnosis Date Noted   Status post total left knee replacement 04/22/2024   Primary osteoarthritis of left knee 04/21/2021   Status post total knee replacement, right 09/14/2020   Primary osteoarthritis of right knee 09/13/2020   Iron deficiency anemia 07/23/2018   Breast cancer metastasized to axillary lymph node, right (HCC) 04/17/2018   Malignant neoplasm of 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, estrogen receptor  positive (HCC) 03/11/2018   Post-operative state 03/06/2018   Uterine hyperplasia 10/11/2017   Anxiety and depression 10/15/2014   Vitamin B 12 deficiency 06/02/2014   Vitamin D  deficiency 06/02/2014   Hereditary and idiopathic peripheral neuropathy 05/14/2014   Past Medical History:  Diagnosis Date   Anemia    Anxiety and depression 10/15/2014   Arthritis    BILATERAL KNEES, HAD CORTISONE INJECTION 03/2017   Breast cancer (HCC) 2019   Right Breast Cancer   Depression    GERD (gastroesophageal reflux disease)    OCCASIONAL WITH CERTAIN FOOD   Headache    patient denies this dx - occasional HA   Heart murmur 2021   pt denies this dx; in 2021, no mumur noted by Dr Patrisha   History of blood transfusion 02/2018   History of radiation therapy 05/28/18- 07/10/18   Right Breast 25 fractions for a total dose of 50 Gy, Right SCV and PAB 25 fractions for a total dose of 50 Gy, right breast boost, 5 fractions for a total dose of 10 Gy.    Personal history of radiation therapy    Right Breast Cancer   Seasonal allergies    SVD (spontaneous vaginal delivery)    x 4   Unspecified hereditary and idiopathic peripheral neuropathy 05/14/2014   LEFT KNEE AND FOOT/HANDS    Family History  Problem Relation Age of Onset   Diabetes Mother  Neuropathy Mother    Other Father    Neuropathy Sister    Cancer Sister        unknown type cancer in neck    Cancer Maternal Aunt        leukemia    Colon cancer Neg Hx    Breast cancer Neg Hx     Past Surgical History:  Procedure Laterality Date   ABDOMINAL HYSTERECTOMY     AXILLARY LYMPH NODE DISSECTION Right 04/17/2018   Procedure: AXILLARY LYMPH NODE DISSECTION;  Surgeon: Aron Shoulders, MD;  Location: [facility];  Service: General;  Laterality: Right;   BREAST EXCISIONAL BIOPSY Right 2007   BREAST LUMPECTOMY Right 2019   BREAST LUMPECTOMY WITH RADIOACTIVE SEED AND SENTINEL LYMPH NODE BIOPSY Right 04/05/2018   Procedure: RIGHT  BREAST SEED BRACKETED LUMPECTOMY WITH SENTINEL LYMPH NODE BIOPSY;  Surgeon: Aron Shoulders, MD;  Location: [facility];  Service: General;  Laterality: Right;   BREAST SURGERY     bx, no cancer    CATARACT EXTRACTION Bilateral    COLONOSCOPY  09/2018   DILATATION & CURETTAGE/HYSTEROSCOPY WITH MYOSURE N/A 08/09/2017   Procedure: DILATATION & CURETTAGE/HYSTEROSCOPY WITH MYOSURE;  Surgeon: Starla Harland BROCKS, MD;  Location: [facility];  Service: Gynecology;  Laterality: N/A;   LABIOPLASTY  03/06/2018   Procedure: LABIAPLASTY;  Surgeon: Starla Harland BROCKS, MD;  Location: [facility];  Service: Gynecology;;   TOTAL KNEE ARTHROPLASTY Right 09/14/2020   Procedure: RIGHT TOTAL KNEE ARTHROPLASTY;  Surgeon: Jerri Kay HERO, MD;  Location: [facility];  Service: Orthopedics;  Laterality: Right;   TOTAL KNEE ARTHROPLASTY Left 04/22/2024   Procedure: ARTHROPLASTY, KNEE, TOTAL;  Surgeon: Jerri Kay HERO, MD;  Location: [facility];  Service: Orthopedics;  Laterality: Left;   UPPER GI ENDOSCOPY  09/2018   VAGINAL HYSTERECTOMY Bilateral 03/06/2018   Procedure: HYSTERECTOMY VAGINAL WITH BILATERAL SALPINGOOPHORECTOMY;  Surgeon: Starla Harland BROCKS, MD;  Location: [facility];  Service: Gynecology;  Laterality: Bilateral;   Social History   Occupational History   Occupation: house   Tobacco Use   Smoking status: Never   Smokeless tobacco: Never  Vaping Use   Vaping status: Never Used  Substance and Sexual Activity   Alcohol use: No   Drug use: No   Sexual activity: Not Currently    Birth control/protection: Surgical    Comment: Hysterectomy

## 2024-06-25 ENCOUNTER — Ambulatory Visit: Attending: Family Medicine | Admitting: Physical Therapy

## 2024-06-25 ENCOUNTER — Encounter: Payer: Self-pay | Admitting: Physical Therapy

## 2024-06-25 DIAGNOSIS — G8929 Other chronic pain: Secondary | ICD-10-CM | POA: Insufficient documentation

## 2024-06-25 DIAGNOSIS — M6281 Muscle weakness (generalized): Secondary | ICD-10-CM | POA: Diagnosis present

## 2024-06-25 DIAGNOSIS — M25562 Pain in left knee: Secondary | ICD-10-CM | POA: Diagnosis present

## 2024-06-25 DIAGNOSIS — R262 Difficulty in walking, not elsewhere classified: Secondary | ICD-10-CM | POA: Diagnosis present

## 2024-06-25 NOTE — Therapy (Signed)
 OUTPATIENT PHYSICAL THERAPY LOWER EXTREMITY TREATMENT   Patient Name: Amy Roman MRN: [id number] DOB:[birth year], 73 y.o., female Today's Date: 06/25/2024  END OF SESSION:  PT End of Session - 06/25/24 1512     Visit Number 3    Number of Visits 13    Date for PT Re-Evaluation 08/01/24    Authorization Type [employer]TE    Authorization - Visit Number 3    Authorization - Number of Visits 27    PT Start Time 0309   pt forgot to check in   PT Stop Time 0337    PT Time Calculation (min) 28 min          Past Medical History:  Diagnosis Date   Anemia    Anxiety and depression 10/15/2014   Arthritis    BILATERAL KNEES, HAD CORTISONE INJECTION 03/2017   Breast cancer (HCC) 2019   Right Breast Cancer   Depression    GERD (gastroesophageal reflux disease)    OCCASIONAL WITH CERTAIN FOOD   Headache    patient denies this dx - occasional HA   Heart murmur 2021   pt denies this dx; in 2021, no mumur noted by Dr Patrisha   History of blood transfusion 02/2018   History of radiation therapy 05/28/18- 07/10/18   Right Breast 25 fractions for a total dose of 50 Gy, Right SCV and PAB 25 fractions for a total dose of 50 Gy, right breast boost, 5 fractions for a total dose of 10 Gy.    Personal history of radiation therapy    Right Breast Cancer   Seasonal allergies    SVD (spontaneous vaginal delivery)    x 4   Unspecified hereditary and idiopathic peripheral neuropathy 05/14/2014   LEFT KNEE AND FOOT/HANDS   Past Surgical History:  Procedure Laterality Date   ABDOMINAL HYSTERECTOMY     AXILLARY LYMPH NODE DISSECTION Right 04/17/2018   Procedure: AXILLARY LYMPH NODE DISSECTION;  Surgeon: Aron Shoulders, MD;  Location: [facility];  Service: General;  Laterality: Right;   BREAST EXCISIONAL BIOPSY Right 2007   BREAST LUMPECTOMY Right 2019   BREAST LUMPECTOMY WITH RADIOACTIVE SEED AND SENTINEL LYMPH NODE BIOPSY Right 04/05/2018   Procedure:  RIGHT BREAST SEED BRACKETED LUMPECTOMY WITH SENTINEL LYMPH NODE BIOPSY;  Surgeon: Aron Shoulders, MD;  Location: [facility];  Service: General;  Laterality: Right;   BREAST SURGERY     bx, no cancer    CATARACT EXTRACTION Bilateral    COLONOSCOPY  09/2018   DILATATION & CURETTAGE/HYSTEROSCOPY WITH MYOSURE N/A 08/09/2017   Procedure: DILATATION & CURETTAGE/HYSTEROSCOPY WITH MYOSURE;  Surgeon: Starla Harland BROCKS, MD;  Location: [facility];  Service: Gynecology;  Laterality: N/A;   LABIOPLASTY  03/06/2018   Procedure: LABIAPLASTY;  Surgeon: Starla Harland BROCKS, MD;  Location: [facility];  Service: Gynecology;;   TOTAL KNEE ARTHROPLASTY Right 09/14/2020   Procedure: RIGHT TOTAL KNEE ARTHROPLASTY;  Surgeon: Jerri Kay HERO, MD;  Location: [facility];  Service: Orthopedics;  Laterality: Right;   TOTAL KNEE ARTHROPLASTY Left 04/22/2024   Procedure: ARTHROPLASTY, KNEE, TOTAL;  Surgeon: Jerri Kay HERO, MD;  Location: [facility];  Service: Orthopedics;  Laterality: Left;   UPPER GI ENDOSCOPY  09/2018   VAGINAL HYSTERECTOMY Bilateral 03/06/2018   Procedure: HYSTERECTOMY VAGINAL WITH BILATERAL SALPINGOOPHORECTOMY;  Surgeon: Starla Harland BROCKS, MD;  Location: [facility];  Service: Gynecology;  Laterality: Bilateral;   Patient Active Problem List   Diagnosis Date Noted   Status post  total left knee replacement 04/22/2024   Primary osteoarthritis of left knee 04/21/2021   Status post total knee replacement, right 09/14/2020   Primary osteoarthritis of right knee 09/13/2020   Iron deficiency anemia 07/23/2018   Breast cancer metastasized to axillary lymph node, right (HCC) 04/17/2018   Malignant neoplasm of 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, estrogen receptor positive (HCC) 03/11/2018   Post-operative state 03/06/2018   Uterine hyperplasia 10/11/2017   Anxiety and depression 10/15/2014   Vitamin B 12 deficiency 06/02/2014   Vitamin D  deficiency 06/02/2014   Hereditary and idiopathic peripheral neuropathy 05/14/2014     PCP: Ilah Crigler, MD   REFERRING PROVIDER: Jule Ronal LITTIE DEVONNA  REFERRING DIAG: [phone] (ICD-10-CM) - Status post total left knee replacement   THERAPY DIAG:  Chronic pain of left knee  Muscle weakness (generalized)  Difficulty in walking, not elsewhere classified  Rationale for Evaluation and Treatment: Rehabilitation  ONSET DATE: 04/22/24, L TKR  SUBJECTIVE:   SUBJECTIVE STATEMENT: 0/10 pain.I still cannot find my cane.   EVAL: Pt reports her L knee is improving. Pt states she received HHPT for 5 visits initially after her surgery. Pt reports the edema of her legs is genetic.  PERTINENT HISTORY: Marked edema both LEs  PAIN:  Are you having pain? Yes: NPRS scale: 0/10 Pain location: L knee Pain description: ache Aggravating factors: Increased time on feet or activity level Relieving factors: Rest  PRECAUTIONS: None  RED FLAGS: None   WEIGHT BEARING RESTRICTIONS: No  FALLS:  Has patient fallen in last 6 months? No  LIVING ENVIRONMENT: Lives with: lives alone Lives in: House/apartment Able to access home which has 3 steps and a handrail  OCCUPATION: Retired  PLOF: Independent  PATIENT GOALS: Decreased pain, increased strength, to have better use of my L leg  NEXT MD VISIT: 06/21/24  OBJECTIVE:  Note: Objective measures were completed at Evaluation unless otherwise noted.  PATIENT SURVEYS:  LEFS: 22/80=28%  COGNITION: Overall cognitive status: Within functional limits for tasks assessed     SENSATION: WFL  EDEMA:  Marked edema both legs  POSTURE: No Significant postural limitations  PALPATION: TTP L peri-knee  LOWER EXTREMITY ROM:  Active ROM Right eval Left eval Left 06/25/24  Hip flexion     Hip extension     Hip abduction     Hip adduction     Hip internal rotation     Hip external rotation     Knee flexion  100 102  Knee extension  0   Ankle dorsiflexion     Ankle plantarflexion     Ankle inversion     Ankle  eversion      (Blank rows = not tested)  LOWER EXTREMITY MMT:  MMT Right eval Left eval  Hip flexion Completes SLR Unable to compete a SLR  Hip extension    Hip abduction    Hip adduction    Hip internal rotation    Hip external rotation    Knee flexion completes Needs strap assist to complete  Knee extension    Ankle dorsiflexion    Ankle plantarflexion    Ankle inversion    Ankle eversion     (Blank rows = not tested)  FUNCTIONAL TESTS:  5 times sit to stand: TBA 2 minute walk test: TBA 06/17/24 5 x STS   18.1 sec, mat table, without UE 06/17/24: 2 MWT 167 feet with SPC  GAIT: Distance walked: 100' Assistive device utilized: None Level of assistance: Complete  Independence Comments: Decreased pace and lateral tilting gait pattern due to marked edema of both LEs                                                                                                                        TREATMENT DATE:  [facility] Adult PT Treatment:                                                DATE: 06/25/24 Therapeutic Exercise: Seated ball squeeze 10 x 2  LAQ 4# 10 x 3  Seated hip flexion 10 x 3  Therapeutic Activity: Nustep L4 UE/LE x 7 minutes Standing hip abduction  10 x 2 each  Standing hip flexion -alternating 2 x 10  STS x 10  Supine heel slide AROM x 10  Assisted SLR x 10  SAQ Left 4# 20 x 2     [facility] Adult PT Treatment:                                                DATE: 06/17/24 Therapeutic Exercise: HL ball squeeze 10 x 2  LAQ 3# 10 x 3  Therapeutic Activity: 5 x STS   18.1 sec, mat table, without UE 2 MWT 167 feet with SPC Nustep L3 UE/LE x 5 minutes Assisted heel slides x 15 SAQ AROM 20 x 2  Assisted SLR x 10       [facility] Adult PT Treatment:                                                DATE: 05/30/24 Therapeutic Exercise: Developed, instructed in, and pt completed therex as noted in HEP  Self Care: Use of cold pack and elevation for 10-15 mins as needed for pain  and swelling   PATIENT EDUCATION:  Education details: Eval findings, POC, HEP, self care  Person educated: Patient Education method: Explanation, Demonstration, Tactile cues, Verbal cues, and Handouts Education comprehension: verbalized understanding, returned demonstration, verbal cues required, and tactile cues required  HOME EXERCISE PROGRAM: Access Code: [email or web address] URL: [email or web address] Date: 06/12/2024 Prepared by: Dasie Daft  Exercises - Active Straight Leg Raise with Quad Set  - 2 x daily - 7 x weekly - 1 sets - 10 reps - Supine Heel Slide with Strap  - 2 x daily - 7 x weekly - 1 sets - 10 reps - 10 hold - Seated Long Arc Quad  - 2 x daily - 7 x weekly - 3 sets - 10 reps - 3 hold - Standing Hip Abduction with Counter Support  - 2  x daily - 7 x weekly - 3 sets - 10 reps - 3 hold  ASSESSMENT:  CLINICAL IMPRESSION: Pt arrives again without AD, reports using SPC or walker at home. Reminded her to bring an AD to PT. Continues to have significant antalgic pattern without AD. Pt was checked in late today so session was shortened. Saw MD Who was pleased with her ROM, wants her to continue PT. She demonstrates improved endurance with standing tasks today, and less assist required with SLR. Can perform a partial SLR independently. Able to increased resistance and reps today for hip flexion and quad strengthening. Knee flexion ROM 102 and likely maxed out due to girth of LE. Will continue to progress as able with strength and functional tasks.    Patient is a 73 y.o. female who was seen today for physical therapy evaluation and treatment for Z96.652 (ICD-10-CM) - Status post total left knee replacement.   OBJECTIVE IMPAIRMENTS: decreased activity tolerance, decreased balance, decreased coordination, difficulty walking, decreased ROM, decreased strength, increased edema, and pain.   ACTIVITY LIMITATIONS: carrying, lifting, bending, standing, squatting, stairs, bed mobility,  bathing, dressing, hygiene/grooming, and locomotion level  PARTICIPATION LIMITATIONS: meal prep, cleaning, laundry, shopping, and community activity  PERSONAL FACTORS: Past/current experiences, Time since onset of injury/illness/exacerbation, and 1 comorbidity: marked edema both LEs are also affecting patient's functional outcome.   REHAB POTENTIAL: Good  CLINICAL DECISION MAKING: Evolving/moderate complexity  EVALUATION COMPLEXITY: Moderate   GOALS:  SHORT TERM GOALS = LTGs  LONG TERM GOALS: Target date: 08/01/24  Pt will be Ind in a final HEP to maintain achieved LOF  Baseline: started Goal status: INITIAL  2.  Increase L knee flexion AROM to 110d for improved knee function with sit to stand, steps, and ambulation Baseline: 100d Goal status: INITIAL  3.  Increase L knee/hip strength to pt being able to complete a SLR for improved function with ambulation, asc/dsc steps, for lifting her L LE into bed Baseline: Unable to complete Goal status: INITIAL  4.  Pt will report improvement in L knee pain for improved function and QOL Baseline: 7/10 Goal status: INITIAL  5.  Improve 5xSTS by MCID of 5 and by MCID of 21ft as indication of improved functional mobility  Baseline: TBA 06/17/24: 18.1 sec ; 2 MWT 167 feet with SPC Goal status: INITIAL  6.  Pt's LEFS score will improve by the MCID to 43% as indication of improved function  Baseline: 28% Goal status: INITIAL   PLAN:  PT FREQUENCY: 2x/week  PT DURATION: 6 weeks  PLANNED INTERVENTIONS: 97164- PT Re-evaluation, 97110-Therapeutic exercises, 97530- Therapeutic activity, 97112- Neuromuscular re-education, 97535- Self Care, 02859- Manual therapy, [phone]- Gait training, Patient/Family education, Balance training, Stair training, Taping, Joint mobilization, Scar mobilization, Cryotherapy, and Moist heat  PLAN FOR NEXT SESSION: assess response to HEP; progress therex as indicated; use of modalities, manual therapy; and  TPDN as indicated.   Harlene Persons, PTA 06/25/24 3:49 PM Phone: [phone] Fax: [phone]

## 2024-06-27 ENCOUNTER — Ambulatory Visit

## 2024-06-27 DIAGNOSIS — M25562 Pain in left knee: Secondary | ICD-10-CM | POA: Diagnosis not present

## 2024-06-27 DIAGNOSIS — G8929 Other chronic pain: Secondary | ICD-10-CM

## 2024-06-27 DIAGNOSIS — R262 Difficulty in walking, not elsewhere classified: Secondary | ICD-10-CM

## 2024-06-27 DIAGNOSIS — M6281 Muscle weakness (generalized): Secondary | ICD-10-CM

## 2024-06-27 NOTE — Therapy (Addendum)
 OUTPATIENT PHYSICAL THERAPY LOWER EXTREMITY TREATMENT   Patient Name: Amy Roman MRN: [id number] DOB:[birth year], 73 y.o., female Today's Date: 06/27/2024  END OF SESSION:  PT End of Session - 06/27/24 1358     Visit Number 4    Number of Visits 13    Date for PT Re-Evaluation 08/01/24    Authorization Type [employer]PLETE    Authorization - Visit Number 4    Authorization - Number of Visits 27    PT Start Time 1330    PT Stop Time 1412    PT Time Calculation (min) 42 min    Activity Tolerance Patient tolerated treatment well    Behavior During Therapy WFL for tasks assessed/performed           Past Medical History:  Diagnosis Date   Anemia    Anxiety and depression 10/15/2014   Arthritis    BILATERAL KNEES, HAD CORTISONE INJECTION 03/2017   Breast cancer (HCC) 2019   Right Breast Cancer   Depression    GERD (gastroesophageal reflux disease)    OCCASIONAL WITH CERTAIN FOOD   Headache    patient denies this dx - occasional HA   Heart murmur 2021   pt denies this dx; in 2021, no mumur noted by Dr Patrisha   History of blood transfusion 02/2018   History of radiation therapy 05/28/18- 07/10/18   Right Breast 25 fractions for a total dose of 50 Gy, Right SCV and PAB 25 fractions for a total dose of 50 Gy, right breast boost, 5 fractions for a total dose of 10 Gy.    Personal history of radiation therapy    Right Breast Cancer   Seasonal allergies    SVD (spontaneous vaginal delivery)    x 4   Unspecified hereditary and idiopathic peripheral neuropathy 05/14/2014   LEFT KNEE AND FOOT/HANDS   Past Surgical History:  Procedure Laterality Date   ABDOMINAL HYSTERECTOMY     AXILLARY LYMPH NODE DISSECTION Right 04/17/2018   Procedure: AXILLARY LYMPH NODE DISSECTION;  Surgeon: Aron Shoulders, MD;  Location: [facility];  Service: General;  Laterality: Right;   BREAST EXCISIONAL BIOPSY Right 2007   BREAST LUMPECTOMY Right 2019   BREAST  LUMPECTOMY WITH RADIOACTIVE SEED AND SENTINEL LYMPH NODE BIOPSY Right 04/05/2018   Procedure: RIGHT BREAST SEED BRACKETED LUMPECTOMY WITH SENTINEL LYMPH NODE BIOPSY;  Surgeon: Aron Shoulders, MD;  Location: [facility];  Service: General;  Laterality: Right;   BREAST SURGERY     bx, no cancer    CATARACT EXTRACTION Bilateral    COLONOSCOPY  09/2018   DILATATION & CURETTAGE/HYSTEROSCOPY WITH MYOSURE N/A 08/09/2017   Procedure: DILATATION & CURETTAGE/HYSTEROSCOPY WITH MYOSURE;  Surgeon: Starla Harland BROCKS, MD;  Location: [facility];  Service: Gynecology;  Laterality: N/A;   LABIOPLASTY  03/06/2018   Procedure: LABIAPLASTY;  Surgeon: Starla Harland BROCKS, MD;  Location: [facility];  Service: Gynecology;;   TOTAL KNEE ARTHROPLASTY Right 09/14/2020   Procedure: RIGHT TOTAL KNEE ARTHROPLASTY;  Surgeon: Jerri Kay HERO, MD;  Location: [facility];  Service: Orthopedics;  Laterality: Right;   TOTAL KNEE ARTHROPLASTY Left 04/22/2024   Procedure: ARTHROPLASTY, KNEE, TOTAL;  Surgeon: Jerri Kay HERO, MD;  Location: [facility];  Service: Orthopedics;  Laterality: Left;   UPPER GI ENDOSCOPY  09/2018   VAGINAL HYSTERECTOMY Bilateral 03/06/2018   Procedure: HYSTERECTOMY VAGINAL WITH BILATERAL SALPINGOOPHORECTOMY;  Surgeon: Starla Harland BROCKS, MD;  Location: [facility];  Service: Gynecology;  Laterality: Bilateral;  Patient Active Problem List   Diagnosis Date Noted   Status post total left knee replacement 04/22/2024   Primary osteoarthritis of left knee 04/21/2021   Status post total knee replacement, right 09/14/2020   Primary osteoarthritis of right knee 09/13/2020   Iron deficiency anemia 07/23/2018   Breast cancer metastasized to axillary lymph node, right (HCC) 04/17/2018   Malignant neoplasm of 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, estrogen receptor positive (HCC) 03/11/2018   Post-operative state 03/06/2018   Uterine hyperplasia 10/11/2017   Anxiety and depression 10/15/2014   Vitamin B 12 deficiency 06/02/2014    Vitamin D  deficiency 06/02/2014   Hereditary and idiopathic peripheral neuropathy 05/14/2014    PCP: Ilah Crigler, MD   REFERRING PROVIDER: Jule Ronal LITTIE DEVONNA  REFERRING DIAG: [phone] (ICD-10-CM) - Status post total left knee replacement   THERAPY DIAG:  Chronic pain of left knee  Muscle weakness (generalized)  Difficulty in walking, not elsewhere classified  Rationale for Evaluation and Treatment: Rehabilitation  ONSET DATE: 04/22/24, L TKR  SUBJECTIVE:   SUBJECTIVE STATEMENT: Pt reports she has still not found her cane.  EVAL: Pt reports her L knee is improving. Pt states she received HHPT for 5 visits initially after her surgery. Pt reports the edema of her legs is genetic.  PERTINENT HISTORY: Marked edema both LEs  PAIN:  Are you having pain? Yes: NPRS scale: 2/10 Pain location: L knee Pain description: ache Aggravating factors: Increased time on feet or activity level Relieving factors: Rest  PRECAUTIONS: None  RED FLAGS: None   WEIGHT BEARING RESTRICTIONS: No  FALLS:  Has patient fallen in last 6 months? No  LIVING ENVIRONMENT: Lives with: lives alone Lives in: House/apartment Able to access home which has 3 steps and a handrail  OCCUPATION: Retired  PLOF: Independent  PATIENT GOALS: Decreased pain, increased strength, to have better use of my L leg  NEXT MD VISIT: 06/21/24  OBJECTIVE:  Note: Objective measures were completed at Evaluation unless otherwise noted.  PATIENT SURVEYS:  LEFS: 22/80=28%  COGNITION: Overall cognitive status: Within functional limits for tasks assessed     SENSATION: WFL  EDEMA:  Marked edema both legs  POSTURE: No Significant postural limitations  PALPATION: TTP L peri-knee  LOWER EXTREMITY ROM:  Active ROM Right eval Left eval Left 06/25/24  Hip flexion     Hip extension     Hip abduction     Hip adduction     Hip internal rotation     Hip external rotation     Knee flexion  100 102  Knee  extension  0   Ankle dorsiflexion     Ankle plantarflexion     Ankle inversion     Ankle eversion      (Blank rows = not tested)  LOWER EXTREMITY MMT:  MMT Right eval Left eval  Hip flexion Completes SLR Unable to compete a SLR  Hip extension    Hip abduction    Hip adduction    Hip internal rotation    Hip external rotation    Knee flexion completes Needs strap assist to complete  Knee extension    Ankle dorsiflexion    Ankle plantarflexion    Ankle inversion    Ankle eversion     (Blank rows = not tested)  FUNCTIONAL TESTS:  5 times sit to stand: TBA 2 minute walk test: TBA 06/17/24 5 x STS   18.1 sec, mat table, without UE 06/17/24: 2 MWT 167 feet with  SPC  GAIT: Distance walked: 100' Assistive device utilized: None Level of assistance: Complete Independence Comments: Decreased pace and lateral tilting gait pattern due to marked edema of both LEs                                                                                                                        TREATMENT DATE:  [facility] Adult PT Treatment:                                                DATE: 06/27/24 Therapeutic Exercise: Seated ball squeeze 10 x 2  LAQ 4# 10 x 3  Seated hip flexion 10 x 3 Therapeutic Activity: Nustep L4 UE/LE x 7 minutes Standing hip abduction  10 x 2 each  Standing hip flexion -alternating 2 x 10 Lateral step ups 4 2x8 each STS 2 x 10  Supine heel slide assisted and AROM x 10 for both Assisted SLR 2 x 10   [facility] Adult PT Treatment:                                                DATE: 06/25/24 Therapeutic Exercise: Seated ball squeeze 10 x 2  LAQ 4# 10 x 3  Seated hip flexion 10 x 3   Therapeutic Activity: Nustep L4 UE/LE x 7 minutes Standing hip abduction  10 x 2 each  Standing hip flexion -alternating 2 x 10  STS x 10  Supine heel slide AROM x 10  Assisted SLR x 10  SAQ Left 4# 20 x 2   [facility] Adult PT Treatment:                                                DATE:  06/17/24 Therapeutic Exercise: HL ball squeeze 10 x 2  LAQ 3# 10 x 3 Therapeutic Activity: 5 x STS   18.1 sec, mat table, without UE 2 MWT 167 feet with SPC Nustep L3 UE/LE x 5 minutes Assisted heel slides x 15 SAQ AROM 20 x 2  Assisted SLR x 10   PATIENT EDUCATION:  Education details: Eval findings, POC, HEP, self care  Person educated: Patient Education method: Explanation, Demonstration, Tactile cues, Verbal cues, and Handouts Education comprehension: verbalized understanding, returned demonstration, verbal cues required, and tactile cues required  HOME EXERCISE PROGRAM: Access Code: [email or web address] URL: [email or web address] Date: 06/12/2024 Prepared by: Dasie Daft  Exercises - Active Straight Leg Raise with Quad Set  - 2 x daily - 7 x weekly - 1 sets - 10 reps - Supine Heel Slide with Strap  - 2  x daily - 7 x weekly - 1 sets - 10 reps - 10 hold - Seated Long Arc Quad  - 2 x daily - 7 x weekly - 3 sets - 10 reps - 3 hold - Standing Hip Abduction with Counter Support  - 2 x daily - 7 x weekly - 3 sets - 10 reps - 3 hold  ASSESSMENT:  CLINICAL IMPRESSION: Pt continues to not be able to find her SPC. PT encouraged her to find to assist with better stability when walking. PT continued to work on ROM, strength, and function of the L knee/LE. L knee flexion seems equal to the R. Pt continues to need a strap to complete a SLR. Pt tolerated prescribed exs in PT today without adverse effects.  OBJECTIVE IMPAIRMENTS: decreased activity tolerance, decreased balance, decreased coordination, difficulty walking, decreased ROM, decreased strength, increased edema, and pain.   ACTIVITY LIMITATIONS: carrying, lifting, bending, standing, squatting, stairs, bed mobility, bathing, dressing, hygiene/grooming, and locomotion level  PARTICIPATION LIMITATIONS: meal prep, cleaning, laundry, shopping, and community activity  PERSONAL FACTORS: Past/current experiences, Time since onset of  injury/illness/exacerbation, and 1 comorbidity: marked edema both LEs are also affecting patient's functional outcome.   REHAB POTENTIAL: Good  CLINICAL DECISION MAKING: Evolving/moderate complexity  EVALUATION COMPLEXITY: Moderate   GOALS:  SHORT TERM GOALS = LTGs  LONG TERM GOALS: Target date: 08/01/24  Pt will be Ind in a final HEP to maintain achieved LOF  Baseline: started Goal status: INITIAL  2.  Increase L knee flexion AROM to 110d for improved knee function with sit to stand, steps, and ambulation Baseline: 100d Goal status: INITIAL  3.  Increase L knee/hip strength to pt being able to complete a SLR for improved function with ambulation, asc/dsc steps, for lifting her L LE into bed Baseline: Unable to complete Goal status: INITIAL  4.  Pt will report improvement in L knee pain for improved function and QOL Baseline: 7/10 Goal status: INITIAL  5.  Improve 5xSTS by MCID of 5 and by MCID of 66ft as indication of improved functional mobility  Baseline: TBA 06/17/24: 18.1 sec ; 2 MWT 167 feet with SPC Goal status: INITIAL  6.  Pt's LEFS score will improve by the MCID to 43% as indication of improved function  Baseline: 28% Goal status: INITIAL   PLAN:  PT FREQUENCY: 2x/week  PT DURATION: 6 weeks  PLANNED INTERVENTIONS: 97164- PT Re-evaluation, 97110-Therapeutic exercises, 97530- Therapeutic activity, 97112- Neuromuscular re-education, 97535- Self Care, 02859- Manual therapy, [phone]- Gait training, Patient/Family education, Balance training, Stair training, Taping, Joint mobilization, Scar mobilization, Cryotherapy, and Moist heat  PLAN FOR NEXT SESSION: assess response to HEP; progress therex as indicated; use of modalities, manual therapy; and TPDN as indicated.   Little Winton MS, PT 06/27/24 2:17 PM   Dasie Daft MS, PT 07/02/24 1:07 PM

## 2024-07-02 ENCOUNTER — Encounter: Payer: Self-pay | Admitting: Physical Therapy

## 2024-07-02 ENCOUNTER — Ambulatory Visit: Admitting: Physical Therapy

## 2024-07-02 DIAGNOSIS — G8929 Other chronic pain: Secondary | ICD-10-CM

## 2024-07-02 DIAGNOSIS — M6281 Muscle weakness (generalized): Secondary | ICD-10-CM

## 2024-07-02 DIAGNOSIS — M25562 Pain in left knee: Secondary | ICD-10-CM | POA: Diagnosis not present

## 2024-07-02 NOTE — Therapy (Signed)
 OUTPATIENT PHYSICAL THERAPY LOWER EXTREMITY TREATMENT   Patient Name: Amy Roman MRN: [id number] DOB:[birth year], 73 y.o., female Today's Date: 07/02/2024  END OF SESSION:  PT End of Session - 07/02/24 1328     Visit Number 5    Number of Visits 13    Date for PT Re-Evaluation 08/01/24    Authorization Type [employer] D[employer]PLETE    Authorization - Visit Number 5    Authorization - Number of Visits 27    PT Start Time 1323    PT Stop Time 1401    PT Time Calculation (min) 38 min           Past Medical History:  Diagnosis Date   Anemia    Anxiety and depression 10/15/2014   Arthritis    BILATERAL KNEES, HAD CORTISONE INJECTION 03/2017   Breast cancer (HCC) 2019   Right Breast Cancer   Depression    GERD (gastroesophageal reflux disease)    OCCASIONAL WITH CERTAIN FOOD   Headache    patient denies this dx - occasional HA   Heart murmur 2021   pt denies this dx; in 2021, no mumur noted by Dr Patrisha   History of blood transfusion 02/2018   History of radiation therapy 05/28/18- 07/10/18   Right Breast 25 fractions for a total dose of 50 Gy, Right SCV and PAB 25 fractions for a total dose of 50 Gy, right breast boost, 5 fractions for a total dose of 10 Gy.    Personal history of radiation therapy    Right Breast Cancer   Seasonal allergies    SVD (spontaneous vaginal delivery)    x 4   Unspecified hereditary and idiopathic peripheral neuropathy 05/14/2014   LEFT KNEE AND FOOT/HANDS   Past Surgical History:  Procedure Laterality Date   ABDOMINAL HYSTERECTOMY     AXILLARY LYMPH NODE DISSECTION Right 04/17/2018   Procedure: AXILLARY LYMPH NODE DISSECTION;  Surgeon: Aron Shoulders, MD;  Location: [facility];  Service: General;  Laterality: Right;   BREAST EXCISIONAL BIOPSY Right 2007   BREAST LUMPECTOMY Right 2019   BREAST LUMPECTOMY WITH RADIOACTIVE SEED AND SENTINEL LYMPH NODE BIOPSY Right 04/05/2018   Procedure: RIGHT BREAST SEED  BRACKETED LUMPECTOMY WITH SENTINEL LYMPH NODE BIOPSY;  Surgeon: Aron Shoulders, MD;  Location: [facility];  Service: General;  Laterality: Right;   BREAST SURGERY     bx, no cancer    CATARACT EXTRACTION Bilateral    COLONOSCOPY  09/2018   DILATATION & CURETTAGE/HYSTEROSCOPY WITH MYOSURE N/A 08/09/2017   Procedure: DILATATION & CURETTAGE/HYSTEROSCOPY WITH MYOSURE;  Surgeon: Starla Harland BROCKS, MD;  Location: [facility];  Service: Gynecology;  Laterality: N/A;   LABIOPLASTY  03/06/2018   Procedure: LABIAPLASTY;  Surgeon: Starla Harland BROCKS, MD;  Location: [facility];  Service: Gynecology;;   TOTAL KNEE ARTHROPLASTY Right 09/14/2020   Procedure: RIGHT TOTAL KNEE ARTHROPLASTY;  Surgeon: Jerri Kay HERO, MD;  Location: [facility];  Service: Orthopedics;  Laterality: Right;   TOTAL KNEE ARTHROPLASTY Left 04/22/2024   Procedure: ARTHROPLASTY, KNEE, TOTAL;  Surgeon: Jerri Kay HERO, MD;  Location: [facility];  Service: Orthopedics;  Laterality: Left;   UPPER GI ENDOSCOPY  09/2018   VAGINAL HYSTERECTOMY Bilateral 03/06/2018   Procedure: HYSTERECTOMY VAGINAL WITH BILATERAL SALPINGOOPHORECTOMY;  Surgeon: Starla Harland BROCKS, MD;  Location: [facility];  Service: Gynecology;  Laterality: Bilateral;   Patient Active Problem List   Diagnosis Date Noted   Status post total left knee replacement 04/22/2024  Primary osteoarthritis of left knee 04/21/2021   Status post total knee replacement, right 09/14/2020   Primary osteoarthritis of right knee 09/13/2020   Iron deficiency anemia 07/23/2018   Breast cancer metastasized to axillary lymph node, right (HCC) 04/17/2018   Malignant neoplasm of 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, estrogen receptor positive (HCC) 03/11/2018   Post-operative state 03/06/2018   Uterine hyperplasia 10/11/2017   Anxiety and depression 10/15/2014   Vitamin B 12 deficiency 06/02/2014   Vitamin D  deficiency 06/02/2014   Hereditary and idiopathic peripheral neuropathy 05/14/2014    PCP: Ilah Crigler, MD   REFERRING PROVIDER: Jule Ronal LITTIE DEVONNA  REFERRING DIAG: [phone] (ICD-10-CM) - Status post total left knee replacement   THERAPY DIAG:  Chronic pain of left knee  Muscle weakness (generalized)  Rationale for Evaluation and Treatment: Rehabilitation  ONSET DATE: 04/22/24, L TKR  SUBJECTIVE:   SUBJECTIVE STATEMENT: Pt reports she has still not found her cane.  EVAL: Pt reports her L knee is improving. Pt states she received HHPT for 5 visits initially after her surgery. Pt reports the edema of her legs is genetic.  PERTINENT HISTORY: Marked edema both LEs  PAIN:  Are you having pain? Yes: NPRS scale: 2/10 Pain location: L knee Pain description: ache Aggravating factors: Increased time on feet or activity level Relieving factors: Rest  PRECAUTIONS: None  RED FLAGS: None   WEIGHT BEARING RESTRICTIONS: No  FALLS:  Has patient fallen in last 6 months? No  LIVING ENVIRONMENT: Lives with: lives alone Lives in: House/apartment Able to access home which has 3 steps and a handrail  OCCUPATION: Retired  PLOF: Independent  PATIENT GOALS: Decreased pain, increased strength, to have better use of my L leg  NEXT MD VISIT: 06/21/24  OBJECTIVE:  Note: Objective measures were completed at Evaluation unless otherwise noted.  PATIENT SURVEYS:  LEFS: 22/80=28%  COGNITION: Overall cognitive status: Within functional limits for tasks assessed     SENSATION: WFL  EDEMA:  Marked edema both legs  POSTURE: No Significant postural limitations  PALPATION: TTP L peri-knee  LOWER EXTREMITY ROM:  Active ROM Right eval Left eval Left 06/25/24  Hip flexion     Hip extension     Hip abduction     Hip adduction     Hip internal rotation     Hip external rotation     Knee flexion  100 102  Knee extension  0   Ankle dorsiflexion     Ankle plantarflexion     Ankle inversion     Ankle eversion      (Blank rows = not tested)  LOWER EXTREMITY  MMT:  MMT Right eval Left eval  Hip flexion Completes SLR Unable to compete a SLR  Hip extension    Hip abduction    Hip adduction    Hip internal rotation    Hip external rotation    Knee flexion completes Needs strap assist to complete  Knee extension    Ankle dorsiflexion    Ankle plantarflexion    Ankle inversion    Ankle eversion     (Blank rows = not tested)  FUNCTIONAL TESTS:  5 times sit to stand: TBA 2 minute walk test: TBA 06/17/24 5 x STS   18.1 sec, mat table, without UE 06/17/24: 2 MWT 167 feet with SPC  GAIT: Distance walked: 100' Assistive device utilized: None Level of assistance: Complete Independence Comments: Decreased pace and lateral tilting gait pattern due to marked edema  of both LEs                                                                                                                        TREATMENT DATE:  [facility] Adult PT Treatment:                                                DATE: 07/02/24 Therapeutic Exercise: Seated ball squeeze 10 x 2  LAQ 5# 10 x 3 with ball squeeze  Seated hip flexion 10 x 2 Therapeutic Activity: Nustep L4 UE/LE x 7 minutes Standing hip abduction  10 x 2 each  Standing hip flexion -alternating 2 x 10 Lateral step ups 4 2x8 each STS 2 x 10  4 inch step up alternating x 10  Supine heel slide assisted and AROM x 10 for both Assisted SLR 2 x 10     [facility] Adult PT Treatment:                                                DATE: 06/27/24 Therapeutic Exercise: Seated ball squeeze 10 x 2  LAQ 4# 10 x 3  Seated hip flexion 10 x 3 Therapeutic Activity: Nustep L4 UE/LE x 7 minutes Standing hip abduction  10 x 2 each  Standing hip flexion -alternating 2 x 10 Lateral step ups 4 2x8 each STS 2 x 10  Supine heel slide assisted and AROM x 10 for both Assisted SLR 2 x 10   [facility] Adult PT Treatment:                                                DATE: 06/25/24 Therapeutic Exercise: Seated ball squeeze 10 x 2  LAQ 4# 10 x  3  Seated hip flexion 10 x 3   Therapeutic Activity: Nustep L4 UE/LE x 7 minutes Standing hip abduction  10 x 2 each  Standing hip flexion -alternating 2 x 10  STS x 10  Supine heel slide AROM x 10  Assisted SLR x 10  SAQ Left 4# 20 x 2   [facility] Adult PT Treatment:                                                DATE: 06/17/24 Therapeutic Exercise: HL ball squeeze 10 x 2  LAQ 3# 10 x 3 Therapeutic Activity: 5 x STS   18.1 sec, mat table, without UE 2 MWT 167 feet  with SPC Nustep L3 UE/LE x 5 minutes Assisted heel slides x 15 SAQ AROM 20 x 2  Assisted SLR x 10   PATIENT EDUCATION:  Education details: Eval findings, POC, HEP, self care  Person educated: Patient Education method: Explanation, Demonstration, Tactile cues, Verbal cues, and Handouts Education comprehension: verbalized understanding, returned demonstration, verbal cues required, and tactile cues required  HOME EXERCISE PROGRAM: Access Code: [email or web address] URL: [email or web address] Date: 06/12/2024 Prepared by: Dasie Daft  Exercises - Active Straight Leg Raise with Quad Set  - 2 x daily - 7 x weekly - 1 sets - 10 reps - Supine Heel Slide with Strap  - 2 x daily - 7 x weekly - 1 sets - 10 reps - 10 hold - Seated Long Arc Quad  - 2 x daily - 7 x weekly - 3 sets - 10 reps - 3 hold - Standing Hip Abduction with Counter Support  - 2 x daily - 7 x weekly - 3 sets - 10 reps - 3 hold  ASSESSMENT:  CLINICAL IMPRESSION: Pt arrives with [facility] today.  PT continued to work on ROM, strength, and function of the L knee/LE.  Pt continues to need a strap to complete a SLR but reports sometimes she can lift it unassisted when at home.  Pt tolerated prescribed exs in PT today without adverse effects. Pt limited by weakness. She has questions about how many visits she needs to complete. Recommended she be able to SLR without assist and resume her normal activities without limitation. She is pleased that her pain is much better  now after TKA.   OBJECTIVE IMPAIRMENTS: decreased activity tolerance, decreased balance, decreased coordination, difficulty walking, decreased ROM, decreased strength, increased edema, and pain.   ACTIVITY LIMITATIONS: carrying, lifting, bending, standing, squatting, stairs, bed mobility, bathing, dressing, hygiene/grooming, and locomotion level  PARTICIPATION LIMITATIONS: meal prep, cleaning, laundry, shopping, and community activity  PERSONAL FACTORS: Past/current experiences, Time since onset of injury/illness/exacerbation, and 1 comorbidity: marked edema both LEs are also affecting patient's functional outcome.   REHAB POTENTIAL: Good  CLINICAL DECISION MAKING: Evolving/moderate complexity  EVALUATION COMPLEXITY: Moderate   GOALS:  SHORT TERM GOALS = LTGs  LONG TERM GOALS: Target date: 08/01/24  Pt will be Ind in a final HEP to maintain achieved LOF  Baseline: started Goal status: INITIAL  2.  Increase L knee flexion AROM to 110d for improved knee function with sit to stand, steps, and ambulation Baseline: 100d 07/02/24:  Goal status: ONGOING    3.  Increase L knee/hip strength to pt being able to complete a SLR for improved function with ambulation, asc/dsc steps, for lifting her L LE into bed Baseline: Unable to complete Goal status: INITIAL  4.  Pt will report improvement in L knee pain for improved function and QOL Baseline: 7/10 Goal status: INITIAL  5.  Improve 5xSTS by MCID of 5 and by MCID of 47ft as indication of improved functional mobility  Baseline: TBA 06/17/24: 18.1 sec ; 2 MWT 167 feet with SPC Goal status: INITIAL  6.  Pt's LEFS score will improve by the MCID to 43% as indication of improved function  Baseline: 28% Goal status: INITIAL   PLAN:  PT FREQUENCY: 2x/week  PT DURATION: 6 weeks  PLANNED INTERVENTIONS: 97164- PT Re-evaluation, 97110-Therapeutic exercises, 97530- Therapeutic activity, 97112- Neuromuscular re-education, 97535- Self  Care, 02859- Manual therapy, [phone]- Gait training, Patient/Family education, Balance training, Stair training, Taping, Joint mobilization, Scar mobilization, Cryotherapy, and Moist heat  PLAN FOR  NEXT SESSION: assess response to HEP; progress therex as indicated; use of modalities, manual therapy; and TPDN as indicated.   Harlene Persons, PTA 07/02/24 2:16 PM Phone: [phone] Fax: [phone]

## 2024-07-03 NOTE — Therapy (Signed)
 OUTPATIENT PHYSICAL THERAPY LOWER EXTREMITY TREATMENT   Patient Name: Amy Roman MRN: [id number] DOB:October 16, 1951, 73 y.o., female Today's Date: 07/04/2024  END OF SESSION:  PT End of Session - 07/04/24 1339     Visit Number 6    Number of Visits 13    Date for PT Re-Evaluation 08/01/24    Authorization Type [employer] D[employer] COMPLETE    Authorization - Visit Number 6    Authorization - Number of Visits 27    PT Start Time 1333    PT Stop Time 1413    PT Time Calculation (min) 40 min    Activity Tolerance Patient tolerated treatment well    Behavior During Therapy WFL for tasks assessed/performed            Past Medical History:  Diagnosis Date   Anemia    Anxiety and depression 10/15/2014   Arthritis    BILATERAL KNEES, HAD CORTISONE INJECTION 03/2017   Breast cancer (HCC) 2019   Right Breast Cancer   Depression    GERD (gastroesophageal reflux disease)    OCCASIONAL WITH CERTAIN FOOD   Headache    patient denies this dx - occasional HA   Heart murmur 2021   pt denies this dx; in 2021, no mumur noted by Dr Patrisha   History of blood transfusion 02/2018   History of radiation therapy 05/28/18- 07/10/18   Right Breast 25 fractions for a total dose of 50 Gy, Right SCV and PAB 25 fractions for a total dose of 50 Gy, right breast boost, 5 fractions for a total dose of 10 Gy.    Personal history of radiation therapy    Right Breast Cancer   Seasonal allergies    SVD (spontaneous vaginal delivery)    x 4   Unspecified hereditary and idiopathic peripheral neuropathy 05/14/2014   LEFT KNEE AND FOOT/HANDS   Past Surgical History:  Procedure Laterality Date   ABDOMINAL HYSTERECTOMY     AXILLARY LYMPH NODE DISSECTION Right 04/17/2018   Procedure: AXILLARY LYMPH NODE DISSECTION;  Surgeon: Aron Shoulders, MD;  Location: [facility];  Service: General;  Laterality: Right;   BREAST EXCISIONAL BIOPSY Right 2007   BREAST LUMPECTOMY Right 2019   BREAST  LUMPECTOMY WITH RADIOACTIVE SEED AND SENTINEL LYMPH NODE BIOPSY Right 04/05/2018   Procedure: RIGHT BREAST SEED BRACKETED LUMPECTOMY WITH SENTINEL LYMPH NODE BIOPSY;  Surgeon: Aron Shoulders, MD;  Location: [facility];  Service: General;  Laterality: Right;   BREAST SURGERY     bx, no cancer    CATARACT EXTRACTION Bilateral    COLONOSCOPY  09/2018   DILATATION & CURETTAGE/HYSTEROSCOPY WITH MYOSURE N/A 08/09/2017   Procedure: DILATATION & CURETTAGE/HYSTEROSCOPY WITH MYOSURE;  Surgeon: Starla Harland BROCKS, MD;  Location: [facility];  Service: Gynecology;  Laterality: N/A;   LABIOPLASTY  03/06/2018   Procedure: LABIAPLASTY;  Surgeon: Starla Harland BROCKS, MD;  Location: [facility];  Service: Gynecology;;   TOTAL KNEE ARTHROPLASTY Right 09/14/2020   Procedure: RIGHT TOTAL KNEE ARTHROPLASTY;  Surgeon: Jerri Kay HERO, MD;  Location: [facility];  Service: Orthopedics;  Laterality: Right;   TOTAL KNEE ARTHROPLASTY Left 04/22/2024   Procedure: ARTHROPLASTY, KNEE, TOTAL;  Surgeon: Jerri Kay HERO, MD;  Location: [facility];  Service: Orthopedics;  Laterality: Left;   UPPER GI ENDOSCOPY  09/2018   VAGINAL HYSTERECTOMY Bilateral 03/06/2018   Procedure: HYSTERECTOMY VAGINAL WITH BILATERAL SALPINGOOPHORECTOMY;  Surgeon: Starla Harland BROCKS, MD;  Location: [facility];  Service: Gynecology;  Laterality: Bilateral;  Patient Active Problem List   Diagnosis Date Noted   Status post total left knee replacement 04/22/2024   Primary osteoarthritis of left knee 04/21/2021   Status post total knee replacement, right 09/14/2020   Primary osteoarthritis of right knee 09/13/2020   Iron deficiency anemia 07/23/2018   Breast cancer metastasized to axillary lymph node, right (HCC) 04/17/2018   Malignant neoplasm of 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, estrogen receptor positive (HCC) 03/11/2018   Post-operative state 03/06/2018   Uterine hyperplasia 10/11/2017   Anxiety and depression 10/15/2014   Vitamin B 12 deficiency 06/02/2014    Vitamin D  deficiency 06/02/2014   Hereditary and idiopathic peripheral neuropathy 05/14/2014    PCP: Ilah Crigler, MD   REFERRING PROVIDER: Jule Ronal LITTIE DEVONNA  REFERRING DIAG: [phone] (ICD-10-CM) - Status post total left knee replacement   THERAPY DIAG:  Chronic pain of left knee  Muscle weakness (generalized)  Rationale for Evaluation and Treatment: Rehabilitation  ONSET DATE: 04/22/24, L TKR  SUBJECTIVE:   SUBJECTIVE STATEMENT: Pt reports no concerns with her L knee  EVAL: Pt reports her L knee is improving. Pt states she received HHPT for 5 visits initially after her surgery. Pt reports the edema of her legs is genetic.  PERTINENT HISTORY: Marked edema both LEs  PAIN:  Are you having pain? Yes: NPRS scale: 3/10 Pain location: L knee Pain description: ache Aggravating factors: Increased time on feet or activity level Relieving factors: Rest  PRECAUTIONS: None  RED FLAGS: None   WEIGHT BEARING RESTRICTIONS: No  FALLS:  Has patient fallen in last 6 months? No  LIVING ENVIRONMENT: Lives with: lives alone Lives in: House/apartment Able to access home which has 3 steps and a handrail  OCCUPATION: Retired  PLOF: Independent  PATIENT GOALS: Decreased pain, increased strength, to have better use of my L leg  NEXT MD VISIT: 06/21/24  OBJECTIVE:  Note: Objective measures were completed at Evaluation unless otherwise noted.  PATIENT SURVEYS:  LEFS: 22/80=28%  COGNITION: Overall cognitive status: Within functional limits for tasks assessed     SENSATION: WFL  EDEMA:  Marked edema both legs  POSTURE: No Significant postural limitations  PALPATION: TTP L peri-knee  LOWER EXTREMITY ROM:  Active ROM Right eval Left eval Left 06/25/24  Hip flexion     Hip extension     Hip abduction     Hip adduction     Hip internal rotation     Hip external rotation     Knee flexion  100 102  Knee extension  0   Ankle dorsiflexion     Ankle  plantarflexion     Ankle inversion     Ankle eversion      (Blank rows = not tested)  LOWER EXTREMITY MMT:  MMT Right eval Left eval  Hip flexion Completes SLR Unable to compete a SLR  Hip extension    Hip abduction    Hip adduction    Hip internal rotation    Hip external rotation    Knee flexion completes Needs strap assist to complete  Knee extension    Ankle dorsiflexion    Ankle plantarflexion    Ankle inversion    Ankle eversion     (Blank rows = not tested)  FUNCTIONAL TESTS:  5 times sit to stand: TBA 2 minute walk test: TBA 06/17/24 5 x STS   18.1 sec, mat table, without UE 06/17/24: 2 MWT 167 feet with SPC  GAIT: Distance walked: 100' Assistive device  utilized: None Level of assistance: Complete Independence Comments: Decreased pace and lateral tilting gait pattern due to marked edema of both LEs                                                                                                                        TREATMENT DATE:  [facility] Adult PT Treatment:                                                DATE: 07/04/24 Therapeutic Exercise: Seated ball squeeze 10 x 2  LAQ 5# 10 x 3 with ball squeeze  Seated hip flexion 10 x 2 Supine heel slide assisted and AROM x 10 for both Therapeutic Activity: Nustep L5 UE/LE x 10 minutes Standing hip abduction  10 x 2 each  Standing hip flexion -alternating 2 x 10 Lateral step ups 4 2x8 each STS 2 x 10  Partial squats at sink x10 Assisted SLR 2 x 10   [facility] Adult PT Treatment:                                                DATE: 07/02/24 Therapeutic Exercise: Seated ball squeeze 10 x 2  LAQ 5# 10 x 3 with ball squeeze  Seated hip flexion 10 x 2 Therapeutic Activity: Nustep L4 UE/LE x 7 minutes Standing hip abduction  10 x 2 each  Standing hip flexion -alternating 2 x 10 Lateral step ups 4 2x8 each STS 2 x 10  4 inch step up alternating x 10  Supine heel slide assisted and AROM x 10 for both Assisted SLR 2 x 10    PATIENT EDUCATION:  Education details: Eval findings, POC, HEP, self care  Person educated: Patient Education method: Explanation, Demonstration, Tactile cues, Verbal cues, and Handouts Education comprehension: verbalized understanding, returned demonstration, verbal cues required, and tactile cues required  HOME EXERCISE PROGRAM: Access Code: [email or web address] URL: [email or web address] Date: 06/12/2024 Prepared by: Dasie Daft  Exercises - Active Straight Leg Raise with Quad Set  - 2 x daily - 7 x weekly - 1 sets - 10 reps - Supine Heel Slide with Strap  - 2 x daily - 7 x weekly - 1 sets - 10 reps - 10 hold - Seated Long Arc Quad  - 2 x daily - 7 x weekly - 3 sets - 10 reps - 3 hold - Standing Hip Abduction with Counter Support  - 2 x daily - 7 x weekly - 3 sets - 10 reps - 3 hold  ASSESSMENT:  CLINICAL IMPRESSION: Pt participated in PT addressing ROM, strength, and function of the L knee/LE. Pt needs a strap to complete a SLR.  Pt's L knee appears to be bending  to the same degree as the R. Pt tolerated prescribed exs in PT today without adverse effects. Pt is progressing appropriately. Will continue to work on L LE strength. Pt will continue to benefit from skilled PT to address impairments for improved L knee/LE function with less pain.   OBJECTIVE IMPAIRMENTS: decreased activity tolerance, decreased balance, decreased coordination, difficulty walking, decreased ROM, decreased strength, increased edema, and pain.   ACTIVITY LIMITATIONS: carrying, lifting, bending, standing, squatting, stairs, bed mobility, bathing, dressing, hygiene/grooming, and locomotion level  PARTICIPATION LIMITATIONS: meal prep, cleaning, laundry, shopping, and community activity  PERSONAL FACTORS: Past/current experiences, Time since onset of injury/illness/exacerbation, and 1 comorbidity: marked edema both LEs are also affecting patient's functional outcome.   REHAB POTENTIAL: Good  CLINICAL  DECISION MAKING: Evolving/moderate complexity  EVALUATION COMPLEXITY: Moderate   GOALS:  SHORT TERM GOALS = LTGs  LONG TERM GOALS: Target date: 08/01/24  Pt will be Ind in a final HEP to maintain achieved LOF  Baseline: started Goal status: INITIAL  2.  Increase L knee flexion AROM to 110d for improved knee function with sit to stand, steps, and ambulation Baseline: 100d 07/02/24:  Goal status: ONGOING    3.  Increase L knee/hip strength to pt being able to complete a SLR for improved function with ambulation, asc/dsc steps, for lifting her L LE into bed Baseline: Unable to complete Goal status: INITIAL  4.  Pt will report improvement in L knee pain for improved function and QOL Baseline: 7/10 Goal status: INITIAL  5.  Improve 5xSTS by MCID of 5 and by MCID of 48ft as indication of improved functional mobility  Baseline: TBA 06/17/24: 18.1 sec ; 2 MWT 167 feet with SPC Goal status: INITIAL  6.  Pt's LEFS score will improve by the MCID to 43% as indication of improved function  Baseline: 28% Goal status: INITIAL   PLAN:  PT FREQUENCY: 2x/week  PT DURATION: 6 weeks  PLANNED INTERVENTIONS: 97164- PT Re-evaluation, 97110-Therapeutic exercises, 97530- Therapeutic activity, 97112- Neuromuscular re-education, 97535- Self Care, 02859- Manual therapy, [phone]- Gait training, Patient/Family education, Balance training, Stair training, Taping, Joint mobilization, Scar mobilization, Cryotherapy, and Moist heat  PLAN FOR NEXT SESSION: assess response to HEP; progress therex as indicated; use of modalities, manual therapy; and TPDN as indicated.   Nakiya Rallis MS, PT 07/04/24 2:16 PM

## 2024-07-04 ENCOUNTER — Ambulatory Visit

## 2024-07-04 DIAGNOSIS — G8929 Other chronic pain: Secondary | ICD-10-CM

## 2024-07-04 DIAGNOSIS — M6281 Muscle weakness (generalized): Secondary | ICD-10-CM

## 2024-07-04 DIAGNOSIS — M25562 Pain in left knee: Secondary | ICD-10-CM | POA: Diagnosis not present

## 2024-07-08 NOTE — Therapy (Signed)
 OUTPATIENT PHYSICAL THERAPY LOWER EXTREMITY TREATMENT   Patient Name: Amy Roman MRN: [id number] DOB:Jan 04, 1951, 73 y.o., female Today's Date: 07/09/2024  END OF SESSION:  PT End of Session - 07/09/24 1333     Visit Number 7    Number of Visits 13    Date for PT Re-Evaluation 08/01/24    Authorization Type [employer]TE    Authorization - Visit Number 7    Authorization - Number of Visits 27    PT Start Time 1333    PT Stop Time 1414    PT Time Calculation (min) 41 min    Activity Tolerance Patient tolerated treatment well    Behavior During Therapy WFL for tasks assessed/performed             Past Medical History:  Diagnosis Date   Anemia    Anxiety and depression 10/15/2014   Arthritis    BILATERAL KNEES, HAD CORTISONE INJECTION 03/2017   Breast cancer (HCC) 2019   Right Breast Cancer   Depression    GERD (gastroesophageal reflux disease)    OCCASIONAL WITH CERTAIN FOOD   Headache    patient denies this dx - occasional HA   Heart murmur 2021   pt denies this dx; in 2021, no mumur noted by Dr Patrisha   History of blood transfusion 02/2018   History of radiation therapy 05/28/18- 07/10/18   Right Breast 25 fractions for a total dose of 50 Gy, Right SCV and PAB 25 fractions for a total dose of 50 Gy, right breast boost, 5 fractions for a total dose of 10 Gy.    Personal history of radiation therapy    Right Breast Cancer   Seasonal allergies    SVD (spontaneous vaginal delivery)    x 4   Unspecified hereditary and idiopathic peripheral neuropathy 05/14/2014   LEFT KNEE AND FOOT/HANDS   Past Surgical History:  Procedure Laterality Date   ABDOMINAL HYSTERECTOMY     AXILLARY LYMPH NODE DISSECTION Right 04/17/2018   Procedure: AXILLARY LYMPH NODE DISSECTION;  Surgeon: Aron Shoulders, MD;  Location: [facility];  Service: General;  Laterality: Right;   BREAST EXCISIONAL BIOPSY Right 2007   BREAST LUMPECTOMY Right 2019    BREAST LUMPECTOMY WITH RADIOACTIVE SEED AND SENTINEL LYMPH NODE BIOPSY Right 04/05/2018   Procedure: RIGHT BREAST SEED BRACKETED LUMPECTOMY WITH SENTINEL LYMPH NODE BIOPSY;  Surgeon: Aron Shoulders, MD;  Location: [facility];  Service: General;  Laterality: Right;   BREAST SURGERY     bx, no cancer    CATARACT EXTRACTION Bilateral    COLONOSCOPY  09/2018   DILATATION & CURETTAGE/HYSTEROSCOPY WITH MYOSURE N/A 08/09/2017   Procedure: DILATATION & CURETTAGE/HYSTEROSCOPY WITH MYOSURE;  Surgeon: Starla Harland BROCKS, MD;  Location: [facility];  Service: Gynecology;  Laterality: N/A;   LABIOPLASTY  03/06/2018   Procedure: LABIAPLASTY;  Surgeon: Starla Harland BROCKS, MD;  Location: [facility];  Service: Gynecology;;   TOTAL KNEE ARTHROPLASTY Right 09/14/2020   Procedure: RIGHT TOTAL KNEE ARTHROPLASTY;  Surgeon: Jerri Kay HERO, MD;  Location: [facility];  Service: Orthopedics;  Laterality: Right;   TOTAL KNEE ARTHROPLASTY Left 04/22/2024   Procedure: ARTHROPLASTY, KNEE, TOTAL;  Surgeon: Jerri Kay HERO, MD;  Location: [facility];  Service: Orthopedics;  Laterality: Left;   UPPER GI ENDOSCOPY  09/2018   VAGINAL HYSTERECTOMY Bilateral 03/06/2018   Procedure: HYSTERECTOMY VAGINAL WITH BILATERAL SALPINGOOPHORECTOMY;  Surgeon: Starla Harland BROCKS, MD;  Location: [facility];  Service: Gynecology;  Laterality:  Bilateral;   Patient Active Problem List   Diagnosis Date Noted   Status post total left knee replacement 04/22/2024   Primary osteoarthritis of left knee 04/21/2021   Status post total knee replacement, right 09/14/2020   Primary osteoarthritis of right knee 09/13/2020   Iron deficiency anemia 07/23/2018   Breast cancer metastasized to axillary lymph node, right (HCC) 04/17/2018   Malignant neoplasm of 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, estrogen receptor positive (HCC) 03/11/2018   Post-operative state 03/06/2018   Uterine hyperplasia 10/11/2017   Anxiety and depression 10/15/2014   Vitamin B 12 deficiency  06/02/2014   Vitamin D  deficiency 06/02/2014   Hereditary and idiopathic peripheral neuropathy 05/14/2014    PCP: Ilah Crigler, MD   REFERRING PROVIDER: Jule Ronal CROME, PA-C  REFERRING DIAG: [phone] (ICD-10-CM) - Status post total left knee replacement   THERAPY DIAG:  Chronic pain of left knee  Muscle weakness (generalized)  Difficulty in walking, not elsewhere classified  Rationale for Evaluation and Treatment: Rehabilitation  ONSET DATE: 04/22/24, L TKR  SUBJECTIVE:   SUBJECTIVE STATEMENT: Pt reports her L knee is improving, but it bothers her after prolonged sitting and trying to sleep at night.  EVAL: Pt reports her L knee is improving. Pt states she received HHPT for 5 visits initially after her surgery. Pt reports the edema of her legs is genetic.  PERTINENT HISTORY: Marked edema both LEs  PAIN:  Are you having pain? Yes: NPRS scale: 4/10 Pain location: L knee Pain description: ache Aggravating factors: Increased time on feet or activity level Relieving factors: Rest  PRECAUTIONS: None  RED FLAGS: None   WEIGHT BEARING RESTRICTIONS: No  FALLS:  Has patient fallen in last 6 months? No  LIVING ENVIRONMENT: Lives with: lives alone Lives in: House/apartment Able to access home which has 3 steps and a handrail  OCCUPATION: Retired  PLOF: Independent  PATIENT GOALS: Decreased pain, increased strength, to have better use of my L leg  NEXT MD VISIT: 06/21/24  OBJECTIVE:  Note: Objective measures were completed at Evaluation unless otherwise noted.  PATIENT SURVEYS:  LEFS: 22/80=28%  COGNITION: Overall cognitive status: Within functional limits for tasks assessed     SENSATION: WFL  EDEMA:  Marked edema both legs  POSTURE: No Significant postural limitations  PALPATION: TTP L peri-knee  LOWER EXTREMITY ROM:  Active ROM Right eval Left eval Left 06/25/24 LT 07/09/24  Hip flexion      Hip extension      Hip abduction      Hip  adduction      Hip internal rotation      Hip external rotation      Knee flexion  100 102 117  Knee extension  0    Ankle dorsiflexion      Ankle plantarflexion      Ankle inversion      Ankle eversion       (Blank rows = not tested)  LOWER EXTREMITY MMT:  MMT Right eval Left eval  Hip flexion Completes SLR Unable to compete a SLR  Hip extension    Hip abduction    Hip adduction    Hip internal rotation    Hip external rotation    Knee flexion completes Needs strap assist to complete  Knee extension    Ankle dorsiflexion    Ankle plantarflexion    Ankle inversion    Ankle eversion     (Blank rows = not tested)  FUNCTIONAL TESTS:  5  times sit to stand: TBA 2 minute walk test: TBA 06/17/24 5 x STS   18.1 sec, mat table, without UE 06/17/24: 2 MWT 167 feet with SPC  GAIT: Distance walked: 100' Assistive device utilized: None Level of assistance: Complete Independence Comments: Decreased pace and lateral tilting gait pattern due to marked edema of both LEs                                                                                                                        TREATMENT DATE:  [facility] Adult PT Treatment:                                                DATE: 07/09/24 Therapeutic Exercise: Seated ball squeeze 10 x 3  LAQ 5# 10 x 3 with ball squeeze  Seated hip flexion 10 x 2 Supine heel slide assisted and AROM x 10 for both Therapeutic Activity: 5xSTS x 2 Standing hip abduction  10 x 2 each  Lateral step ups 4 2x8 each Partial squats at sink x10 Standing L SLR 2 x 10  Heel raises 2x10  [facility] Adult PT Treatment:                                                DATE: 07/04/24 Therapeutic Exercise: Seated ball squeeze 10 x 2  LAQ 5# 10 x 3 with ball squeeze  Seated hip flexion 10 x 2 Supine heel slide assisted and AROM x 10 for both Therapeutic Activity: Nustep L5 UE/LE x 10 minutes Standing hip abduction  10 x 2 each  Standing hip flexion  -alternating 2 x 10 Lateral step ups 4 2x8 each STS 2 x 10  Partial squats at sink x10 Assisted SLR 2 x 10   PATIENT EDUCATION:  Education details: Eval findings, POC, HEP, self care  Person educated: Patient Education method: Explanation, Demonstration, Tactile cues, Verbal cues, and Handouts Education comprehension: verbalized understanding, returned demonstration, verbal cues required, and tactile cues required  HOME EXERCISE PROGRAM: Access Code: [email or web address] URL: [email or web address] Date: 06/12/2024 Prepared by: Dasie Daft  Exercises - Active Straight Leg Raise with Quad Set  - 2 x daily - 7 x weekly - 1 sets - 10 reps - Supine Heel Slide with Strap  - 2 x daily - 7 x weekly - 1 sets - 10 reps - 10 hold - Seated Long Arc Quad  - 2 x daily - 7 x weekly - 3 sets - 10 reps - 3 hold - Standing Hip Abduction with Counter Support  - 2 x daily - 7 x weekly - 3 sets - 10 reps - 3 hold  ASSESSMENT:  CLINICAL IMPRESSION: Completed reassessment today. Pt's L knee  ROM has increased to 117d. Functional tests were completed and the pt has made some improvement, but both the 5xSTS and tests were less than set goals. Pt is still not ale to complete a SLR on the L as she is able to with the R. Pt is making appropriate progress with her rehab s/p a L TKR and will continue to benefit from continued PT to maximize her functional mobility.   OBJECTIVE IMPAIRMENTS: decreased activity tolerance, decreased balance, decreased coordination, difficulty walking, decreased ROM, decreased strength, increased edema, and pain.   ACTIVITY LIMITATIONS: carrying, lifting, bending, standing, squatting, stairs, bed mobility, bathing, dressing, hygiene/grooming, and locomotion level  PARTICIPATION LIMITATIONS: meal prep, cleaning, laundry, shopping, and community activity  PERSONAL FACTORS: Past/current experiences, Time since onset of injury/illness/exacerbation, and 1 comorbidity: marked edema  both LEs are also affecting patient's functional outcome.   REHAB POTENTIAL: Good  CLINICAL DECISION MAKING: Evolving/moderate complexity  EVALUATION COMPLEXITY: Moderate   GOALS:  SHORT TERM GOALS = LTGs  LONG TERM GOALS: Target date: 08/01/24  Pt will be Ind in a final HEP to maintain achieved LOF  Baseline: started Goal status: ONGOING  2.  Increase L knee flexion AROM to 110d for improved knee function with sit to stand, steps, and ambulation Baseline: 100d 07/09/24: 117d Goal status: MET  3.  Increase L knee/hip strength to pt being able to complete a SLR for improved function with ambulation, asc/dsc steps, for lifting her L LE into bed Baseline: Unable to complete 07/09/24: Unable to complete Goal status: INITIAL  4.  Pt will report improvement in L knee pain for improved function and QOL Baseline: 7/10 07/09/24: 4/10 Goal status: IMPROVED  5.  Improve 5xSTS by MCID of 5 and by MCID of 73ft as indication of improved functional mobility  Baseline: TBA 06/17/24: 18.1 sec ; 2 MWT 167 feet with SPC 07/09/24: 16.7; 185 Goal status: IMPROVED  6.  Pt's LEFS score will improve by the MCID to 43% as indication of improved function  Baseline: 28% Goal status: INITIAL   PLAN:  PT FREQUENCY: 2x/week  PT DURATION: 6 weeks  PLANNED INTERVENTIONS: 97164- PT Re-evaluation, 97110-Therapeutic exercises, 97530- Therapeutic activity, 97112- Neuromuscular re-education, 97535- Self Care, 02859- Manual therapy, [phone]- Gait training, Patient/Family education, Balance training, Stair training, Taping, Joint mobilization, Scar mobilization, Cryotherapy, and Moist heat  PLAN FOR NEXT SESSION: assess response to HEP; progress therex as indicated; use of modalities, manual therapy; and TPDN as indicated.   Watt Geiler MS, PT 07/09/24 2:15 PM

## 2024-07-09 ENCOUNTER — Ambulatory Visit

## 2024-07-09 DIAGNOSIS — R262 Difficulty in walking, not elsewhere classified: Secondary | ICD-10-CM

## 2024-07-09 DIAGNOSIS — G8929 Other chronic pain: Secondary | ICD-10-CM

## 2024-07-09 DIAGNOSIS — M25562 Pain in left knee: Secondary | ICD-10-CM | POA: Diagnosis not present

## 2024-07-09 DIAGNOSIS — M6281 Muscle weakness (generalized): Secondary | ICD-10-CM

## 2024-07-10 NOTE — Therapy (Signed)
 OUTPATIENT PHYSICAL THERAPY LOWER EXTREMITY TREATMENT   Patient Name: Amy Roman MRN: [id number] DOB:[birth year], 73 y.o., female Today's Date: 07/11/2024  END OF SESSION:  PT End of Session - 07/11/24 1343     Visit Number 8    Number of Visits 13    Date for Recertification  08/01/24    Authorization Type [employer]PLETE    Authorization - Visit Number 8    Authorization - Number of Visits 27    PT Start Time 1330    PT Stop Time 1410    PT Time Calculation (min) 40 min    Activity Tolerance Patient tolerated treatment well    Behavior During Therapy WFL for tasks assessed/performed              Past Medical History:  Diagnosis Date   Anemia    Anxiety and depression 10/15/2014   Arthritis    BILATERAL KNEES, HAD CORTISONE INJECTION 03/2017   Breast cancer (HCC) 2019   Right Breast Cancer   Depression    GERD (gastroesophageal reflux disease)    OCCASIONAL WITH CERTAIN FOOD   Headache    patient denies this dx - occasional HA   Heart murmur 2021   pt denies this dx; in 2021, no mumur noted by Dr Patrisha   History of blood transfusion 02/2018   History of radiation therapy 05/28/18- 07/10/18   Right Breast 25 fractions for a total dose of 50 Gy, Right SCV and PAB 25 fractions for a total dose of 50 Gy, right breast boost, 5 fractions for a total dose of 10 Gy.    Personal history of radiation therapy    Right Breast Cancer   Seasonal allergies    SVD (spontaneous vaginal delivery)    x 4   Unspecified hereditary and idiopathic peripheral neuropathy 05/14/2014   LEFT KNEE AND FOOT/HANDS   Past Surgical History:  Procedure Laterality Date   ABDOMINAL HYSTERECTOMY     AXILLARY LYMPH NODE DISSECTION Right 04/17/2018   Procedure: AXILLARY LYMPH NODE DISSECTION;  Surgeon: Aron Shoulders, MD;  Location: [facility];  Service: General;  Laterality: Right;   BREAST EXCISIONAL BIOPSY Right 2007   BREAST LUMPECTOMY Right 2019    BREAST LUMPECTOMY WITH RADIOACTIVE SEED AND SENTINEL LYMPH NODE BIOPSY Right 04/05/2018   Procedure: RIGHT BREAST SEED BRACKETED LUMPECTOMY WITH SENTINEL LYMPH NODE BIOPSY;  Surgeon: Aron Shoulders, MD;  Location: [facility];  Service: General;  Laterality: Right;   BREAST SURGERY     bx, no cancer    CATARACT EXTRACTION Bilateral    COLONOSCOPY  09/2018   DILATATION & CURETTAGE/HYSTEROSCOPY WITH MYOSURE N/A 08/09/2017   Procedure: DILATATION & CURETTAGE/HYSTEROSCOPY WITH MYOSURE;  Surgeon: Starla Harland BROCKS, MD;  Location: [facility];  Service: Gynecology;  Laterality: N/A;   LABIOPLASTY  03/06/2018   Procedure: LABIAPLASTY;  Surgeon: Starla Harland BROCKS, MD;  Location: [facility];  Service: Gynecology;;   TOTAL KNEE ARTHROPLASTY Right 09/14/2020   Procedure: RIGHT TOTAL KNEE ARTHROPLASTY;  Surgeon: Jerri Kay HERO, MD;  Location: [facility];  Service: Orthopedics;  Laterality: Right;   TOTAL KNEE ARTHROPLASTY Left 04/22/2024   Procedure: ARTHROPLASTY, KNEE, TOTAL;  Surgeon: Jerri Kay HERO, MD;  Location: [facility];  Service: Orthopedics;  Laterality: Left;   UPPER GI ENDOSCOPY  09/2018   VAGINAL HYSTERECTOMY Bilateral 03/06/2018   Procedure: HYSTERECTOMY VAGINAL WITH BILATERAL SALPINGOOPHORECTOMY;  Surgeon: Starla Harland BROCKS, MD;  Location: [facility];  Service: Gynecology;  Laterality: Bilateral;   Patient Active Problem List   Diagnosis Date Noted   Status post total left knee replacement 04/22/2024   Primary osteoarthritis of left knee 04/21/2021   Status post total knee replacement, right 09/14/2020   Primary osteoarthritis of right knee 09/13/2020   Iron deficiency anemia 07/23/2018   Breast cancer metastasized to axillary lymph node, right (HCC) 04/17/2018   Malignant neoplasm of 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, estrogen receptor positive (HCC) 03/11/2018   Post-operative state 03/06/2018   Uterine hyperplasia 10/11/2017   Anxiety and depression 10/15/2014   Vitamin B 12 deficiency  06/02/2014   Vitamin D  deficiency 06/02/2014   Hereditary and idiopathic peripheral neuropathy 05/14/2014    PCP: Ilah Crigler, MD   REFERRING PROVIDER: Jule Ronal LITTIE DEVONNA  REFERRING DIAG: [phone] (ICD-10-CM) - Status post total left knee replacement   THERAPY DIAG:  Chronic pain of left knee  Muscle weakness (generalized)  Difficulty in walking, not elsewhere classified  Rationale for Evaluation and Treatment: Rehabilitation  ONSET DATE: 04/22/24, L TKR  SUBJECTIVE:   SUBJECTIVE STATEMENT: Pt reports she is not currently experiencing L knee pain.  Pt reports her L knee is improving, but it bothers her after prolonged sitting and trying to sleep at night.  EVAL: Pt reports her L knee is improving. Pt states she received HHPT for 5 visits initially after her surgery. Pt reports the edema of her legs is genetic.  PERTINENT HISTORY: Marked edema both LEs  PAIN:  Are you having pain? Yes: NPRS scale: 4/10 Pain location: L knee Pain description: ache Aggravating factors: Increased time on feet or activity level Relieving factors: Rest  PRECAUTIONS: None  RED FLAGS: None   WEIGHT BEARING RESTRICTIONS: No  FALLS:  Has patient fallen in last 6 months? No  LIVING ENVIRONMENT: Lives with: lives alone Lives in: House/apartment Able to access home which has 3 steps and a handrail  OCCUPATION: Retired  PLOF: Independent  PATIENT GOALS: Decreased pain, increased strength, to have better use of my L leg  NEXT MD VISIT: 06/21/24  OBJECTIVE:  Note: Objective measures were completed at Evaluation unless otherwise noted.  PATIENT SURVEYS:  LEFS: 22/80=28%  COGNITION: Overall cognitive status: Within functional limits for tasks assessed     SENSATION: WFL  EDEMA:  Marked edema both legs  POSTURE: No Significant postural limitations  PALPATION: TTP L peri-knee  LOWER EXTREMITY ROM:  Active ROM Right eval Left eval Left 06/25/24 LT 07/09/24  Hip  flexion      Hip extension      Hip abduction      Hip adduction      Hip internal rotation      Hip external rotation      Knee flexion  100 102 117  Knee extension  0    Ankle dorsiflexion      Ankle plantarflexion      Ankle inversion      Ankle eversion       (Blank rows = not tested)  LOWER EXTREMITY MMT:  MMT Right eval Left eval  Hip flexion Completes SLR Unable to compete a SLR  Hip extension    Hip abduction    Hip adduction    Hip internal rotation    Hip external rotation    Knee flexion completes Needs strap assist to complete  Knee extension    Ankle dorsiflexion    Ankle plantarflexion    Ankle inversion    Ankle eversion     (  Blank rows = not tested)  FUNCTIONAL TESTS:  5 times sit to stand: TBA 2 minute walk test: TBA 06/17/24 5 x STS   18.1 sec, mat table, without UE 06/17/24: 2 MWT 167 feet with SPC  GAIT: Distance walked: 100' Assistive device utilized: None Level of assistance: Complete Independence Comments: Decreased pace and lateral tilting gait pattern due to marked edema of both LEs                                                                                                                        TREATMENT DATE:  [facility] Adult PT Treatment:                                                DATE: 07/11/24 Therapeutic Exercise: L SLR c strap assist to raise and min assist to lower Seated ball squeeze 10 x 3  LAQ 5# 10 x 3 with ball squeeze  Seated hip flexion 10 x 2 Supine heel slide assisted x 10 for both Therapeutic Activity: Nustep x7 mins L5 UE/LE STS 2x7 Standing hip abduction  10 x 2 each  Lateral step ups 4 2x8 each Standing L SLR 2 x 10  Heel raises 2x10  [facility] Adult PT Treatment:                                                DATE: 07/09/24 Therapeutic Exercise: Seated ball squeeze 10 x 3  LAQ 5# 10 x 3 with ball squeeze  Seated hip flexion 10 x 2 Supine heel slide assisted and AROM x 10 for both Therapeutic Activity: 5xSTS  x 2 Standing hip abduction  10 x 2 each  Lateral step ups 4 2x8 each Partial squats at sink x10 Standing L SLR 2 x 10  Heel raises 2x10  [facility] Adult PT Treatment:                                                DATE: 07/04/24 Therapeutic Exercise: Seated ball squeeze 10 x 2  LAQ 5# 10 x 3 with ball squeeze  Seated hip flexion 10 x 2 Supine heel slide assisted and AROM x 10 for both Therapeutic Activity: Nustep L5 UE/LE x 10 minutes Standing hip abduction  10 x 2 each  Standing hip flexion -alternating 2 x 10 Lateral step ups 4 2x8 each STS 2 x 10  Partial squats at sink x10 Assisted SLR 2 x 10   PATIENT EDUCATION:  Education details: Eval findings, POC, HEP, self care  Person educated: Patient Education method: Explanation, Demonstration,  Tactile cues, Verbal cues, and Handouts Education comprehension: verbalized understanding, returned demonstration, verbal cues required, and tactile cues required  HOME EXERCISE PROGRAM: Access Code: [email or web address] URL: [email or web address] Date: 06/12/2024 Prepared by: Dasie Daft  Exercises - Active Straight Leg Raise with Quad Set  - 2 x daily - 7 x weekly - 1 sets - 10 reps - Supine Heel Slide with Strap  - 2 x daily - 7 x weekly - 1 sets - 10 reps - 10 hold - Seated Long Arc Quad  - 2 x daily - 7 x weekly - 3 sets - 10 reps - 3 hold - Standing Hip Abduction with Counter Support  - 2 x daily - 7 x weekly - 3 sets - 10 reps - 3 hold  ASSESSMENT:  CLINICAL IMPRESSION: PT was completed for LE strengthening and balance. The L LE continues to demonstrate weakness. Pt remembered to bring her [facility] today which provides better support and pt's gait quality is better. Pt will continue to benefit from skilled PT to address impairments for improved functional mobility.   OBJECTIVE IMPAIRMENTS: decreased activity tolerance, decreased balance, decreased coordination, difficulty walking, decreased ROM, decreased strength, increased  edema, and pain.   ACTIVITY LIMITATIONS: carrying, lifting, bending, standing, squatting, stairs, bed mobility, bathing, dressing, hygiene/grooming, and locomotion level  PARTICIPATION LIMITATIONS: meal prep, cleaning, laundry, shopping, and community activity  PERSONAL FACTORS: Past/current experiences, Time since onset of injury/illness/exacerbation, and 1 comorbidity: marked edema both LEs are also affecting patient's functional outcome.   REHAB POTENTIAL: Good  CLINICAL DECISION MAKING: Evolving/moderate complexity  EVALUATION COMPLEXITY: Moderate   GOALS:  SHORT TERM GOALS = LTGs  LONG TERM GOALS: Target date: 08/01/24  Pt will be Ind in a final HEP to maintain achieved LOF  Baseline: started Goal status: ONGOING  2.  Increase L knee flexion AROM to 110d for improved knee function with sit to stand, steps, and ambulation Baseline: 100d 07/09/24: 117d Goal status: MET  3.  Increase L knee/hip strength to pt being able to complete a SLR for improved function with ambulation, asc/dsc steps, for lifting her L LE into bed Baseline: Unable to complete 07/09/24: Unable to complete Goal status: INITIAL  4.  Pt will report improvement in L knee pain for improved function and QOL Baseline: 7/10 07/09/24: 4/10 Goal status: IMPROVED  5.  Improve 5xSTS by MCID of 5 and by MCID of 79ft as indication of improved functional mobility  Baseline: TBA 06/17/24: 18.1 sec ; 2 MWT 167 feet with SPC 07/09/24: 16.7; 185 Goal status: IMPROVED  6.  Pt's LEFS score will improve by the MCID to 43% as indication of improved function  Baseline: 28% Goal status: INITIAL   PLAN:  PT FREQUENCY: 2x/week  PT DURATION: 6 weeks  PLANNED INTERVENTIONS: 97164- PT Re-evaluation, 97110-Therapeutic exercises, 97530- Therapeutic activity, 97112- Neuromuscular re-education, 97535- Self Care, 02859- Manual therapy, [phone]- Gait training, Patient/Family education, Balance training, Stair training,  Taping, Joint mobilization, Scar mobilization, Cryotherapy, and Moist heat  PLAN FOR NEXT SESSION: assess response to HEP; progress therex as indicated; use of modalities, manual therapy; and TPDN as indicated.   Kairi Harshbarger MS, PT 07/11/24 2:12 PM

## 2024-07-11 ENCOUNTER — Ambulatory Visit

## 2024-07-11 DIAGNOSIS — M25562 Pain in left knee: Secondary | ICD-10-CM | POA: Diagnosis not present

## 2024-07-11 DIAGNOSIS — M6281 Muscle weakness (generalized): Secondary | ICD-10-CM

## 2024-07-11 DIAGNOSIS — G8929 Other chronic pain: Secondary | ICD-10-CM

## 2024-07-11 DIAGNOSIS — R262 Difficulty in walking, not elsewhere classified: Secondary | ICD-10-CM

## 2024-07-16 ENCOUNTER — Ambulatory Visit: Admitting: Physical Therapy

## 2024-07-18 ENCOUNTER — Encounter: Admitting: Physical Therapy

## 2024-07-19 ENCOUNTER — Ambulatory Visit (INDEPENDENT_AMBULATORY_CARE_PROVIDER_SITE_OTHER): Admitting: Physician Assistant

## 2024-07-19 DIAGNOSIS — Z96652 Presence of left artificial knee joint: Secondary | ICD-10-CM

## 2024-07-19 NOTE — Progress Notes (Signed)
 Post-Op Visit Note   Patient: Amy Roman           Date of Birth: [birth year]           MRN: [id number] Visit Date: 07/19/2024 PCP: Ilah Crigler, MD   Assessment & Plan:  Chief Complaint:  Chief Complaint  Patient presents with   Left Knee - Follow-up    Left TKA 04/22/2024   Visit Diagnoses:  1. Status post total left knee replacement     Plan: Patient is a pleasant 73 year old female who comes in today 3 months status post left total knee replacement 04/22/2024.  She has been doing okay.  She notes some pain which continues to improve over time.  She has finished outpatient physical therapy.  Examination of the left knee reveals range of motion from 0 to 115 degrees.  She is stable to valgus varus stress.  She is neurovascularly intact distally.  At this point, she will continue with her home exercise program.  She will follow-up in 3 months for repeat evaluation and 2 view x-rays of the left knee.  Call with concerns or questions.  Follow-Up Instructions: Return in about 3 months (around 10/18/2024).   Orders:  No orders of the defined types were placed in this encounter.  No orders of the defined types were placed in this encounter.   Imaging: No new imaging  PMFS History: Patient Active Problem List   Diagnosis Date Noted   Status post total left knee replacement 04/22/2024   Primary osteoarthritis of left knee 04/21/2021   Status post total knee replacement, right 09/14/2020   Primary osteoarthritis of right knee 09/13/2020   Iron deficiency anemia 07/23/2018   Breast cancer metastasized to axillary lymph node, right (HCC) 04/17/2018   Malignant neoplasm of 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, estrogen receptor positive (HCC) 03/11/2018   Post-operative state 03/06/2018   Uterine hyperplasia 10/11/2017   Anxiety and depression 10/15/2014   Vitamin B 12 deficiency 06/02/2014   Vitamin D  deficiency 06/02/2014   Hereditary and idiopathic  peripheral neuropathy 05/14/2014   Past Medical History:  Diagnosis Date   Anemia    Anxiety and depression 10/15/2014   Arthritis    BILATERAL KNEES, HAD CORTISONE INJECTION 03/2017   Breast cancer (HCC) 2019   Right Breast Cancer   Depression    GERD (gastroesophageal reflux disease)    OCCASIONAL WITH CERTAIN FOOD   Headache    patient denies this dx - occasional HA   Heart murmur 2021   pt denies this dx; in 2021, no mumur noted by Dr Patrisha   History of blood transfusion 02/2018   History of radiation therapy 05/28/18- 07/10/18   Right Breast 25 fractions for a total dose of 50 Gy, Right SCV and PAB 25 fractions for a total dose of 50 Gy, right breast boost, 5 fractions for a total dose of 10 Gy.    Personal history of radiation therapy    Right Breast Cancer   Seasonal allergies    SVD (spontaneous vaginal delivery)    x 4   Unspecified hereditary and idiopathic peripheral neuropathy 05/14/2014   LEFT KNEE AND FOOT/HANDS    Family History  Problem Relation Age of Onset   Diabetes Mother    Neuropathy Mother    Other Father    Neuropathy Sister    Cancer Sister        unknown type cancer in neck    Cancer Maternal Aunt  leukemia    Colon cancer Neg Hx    Breast cancer Neg Hx     Past Surgical History:  Procedure Laterality Date   ABDOMINAL HYSTERECTOMY     AXILLARY LYMPH NODE DISSECTION Right 04/17/2018   Procedure: AXILLARY LYMPH NODE DISSECTION;  Surgeon: Aron Shoulders, MD;  Location: [facility];  Service: General;  Laterality: Right;   BREAST EXCISIONAL BIOPSY Right 2007   BREAST LUMPECTOMY Right 2019   BREAST LUMPECTOMY WITH RADIOACTIVE SEED AND SENTINEL LYMPH NODE BIOPSY Right 04/05/2018   Procedure: RIGHT BREAST SEED BRACKETED LUMPECTOMY WITH SENTINEL LYMPH NODE BIOPSY;  Surgeon: Aron Shoulders, MD;  Location: [facility];  Service: General;  Laterality: Right;   BREAST SURGERY     bx, no cancer    CATARACT EXTRACTION  Bilateral    COLONOSCOPY  09/2018   DILATATION & CURETTAGE/HYSTEROSCOPY WITH MYOSURE N/A 08/09/2017   Procedure: DILATATION & CURETTAGE/HYSTEROSCOPY WITH MYOSURE;  Surgeon: Starla Harland BROCKS, MD;  Location: [facility];  Service: Gynecology;  Laterality: N/A;   LABIOPLASTY  03/06/2018   Procedure: LABIAPLASTY;  Surgeon: Starla Harland BROCKS, MD;  Location: [facility];  Service: Gynecology;;   TOTAL KNEE ARTHROPLASTY Right 09/14/2020   Procedure: RIGHT TOTAL KNEE ARTHROPLASTY;  Surgeon: Jerri Kay HERO, MD;  Location: [facility];  Service: Orthopedics;  Laterality: Right;   TOTAL KNEE ARTHROPLASTY Left 04/22/2024   Procedure: ARTHROPLASTY, KNEE, TOTAL;  Surgeon: Jerri Kay HERO, MD;  Location: [facility];  Service: Orthopedics;  Laterality: Left;   UPPER GI ENDOSCOPY  09/2018   VAGINAL HYSTERECTOMY Bilateral 03/06/2018   Procedure: HYSTERECTOMY VAGINAL WITH BILATERAL SALPINGOOPHORECTOMY;  Surgeon: Starla Harland BROCKS, MD;  Location: [facility];  Service: Gynecology;  Laterality: Bilateral;   Social History   Occupational History   Occupation: house   Tobacco Use   Smoking status: Never   Smokeless tobacco: Never  Vaping Use   Vaping status: Never Used  Substance and Sexual Activity   Alcohol use: No   Drug use: No   Sexual activity: Not Currently    Birth control/protection: Surgical    Comment: Hysterectomy

## 2024-07-24 ENCOUNTER — Other Ambulatory Visit: Payer: Self-pay | Admitting: Nurse Practitioner

## 2024-07-24 ENCOUNTER — Ambulatory Visit

## 2024-07-24 DIAGNOSIS — Z170 Estrogen receptor positive status [ER+]: Secondary | ICD-10-CM

## 2024-07-24 NOTE — Progress Notes (Signed)
 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 oncotype showed low risk, adjuvant chemo was not recommended.  -began adjuvant AI with letrozole  in 07/2018, tolerating well with stable joint pain.  She switched to tamoxifen  11/2021 due to osteoporosis.  Goal of therapy is a total of 5-10 years -Ms. [name] Sharie is clinically doing well.  Tolerating tamoxifen .  Breast exam is benign, labs are stable.  I encouraged her to hydrate.  No clinical concern for breast cancer recurrence.  Mammogram is scheduled for later today, will follow  -She continues to tolerate tamoxifen  with minimal and manageable side effects. -Screening mammogram from 08/22/2023 was negative.  Annual screening mammogram to be scheduled after 08/22/2024. - Continue to follow-up anually   Plan Labs reviewed. -Mild and stable anemia. -Mild elevation of serum creatinine eGFR 47 (stable over past several lab checks) Screening mammogram ordered for after 08/12/2024. Continue tamoxifen  daily. Annual labs with follow-up. The patient understands the plans discussed today and is in agreement with them.  She knows to contact our office if she develops concerns prior to her next appointment.  I provided 25 minutes of face-to-face time during this encounter and > 50% was spent counseling as documented under my assessment  and plan.    Powell FORBES Lessen, NP  [facility] - A DEPT OF [facility] [address] [place] [address] Dept: [phone] Dept Fax: [phone]   No orders of the defined types were placed in this encounter.     CHIEF COMPLAINT:  CC: Right breast cancer, ER  +  Current Treatment: Tamoxifen  20 mg daily (started 11/2021 with goal of 5-10 years. Previously on letrozole  from 07/2018. Switched to tamoxifen  due to osteoporosis)  INTERVAL HISTORY:  Gurtha is here today for repeat clinical assessment.  She last saw Dr. Lanny on 07/26/2023.  She continues to tolerate tamoxifen  well.  Denies unmanageable hot flashes or night sweats.  Most recent screening mammogram done 08/22/2023 with negative results.  She is due for her screening mammogram after 08/22/2024.  Will order as part of today's visit.  She denies changes or new lumps in either breast.  She denies chest pain, chest pressure, or shortness of breath. She denies headaches or visual disturbances. She denies abdominal pain, nausea, vomiting, or changes in bowel or bladder habits.   She denies fevers or chills. She denies pain. Her appetite is good. Her weight has decreased 8 pounds over last 4 months.  I have reviewed the past medical history, past surgical history, social history and family history with the patient and they are unchanged from previous note.  ALLERGIES:  has no known allergies.  MEDICATIONS:  Current Outpatient Medications  Medication Sig Dispense Refill   amitriptyline  (ELAVIL ) 25 MG tablet TAKE 2 TABLETS(50 MG) BY MOUTH AT BEDTIME. MAY INCREASE TO 75 MG AT BEDTIME AFTER 2 WEEKS 60 tablet 3   b complex vitamins tablet  Take 1 tablet by mouth daily.     famotidine  (PEPCID ) 20 MG tablet Take 20 mg by mouth daily as needed for heartburn or indigestion.     FLUoxetine  (PROZAC ) 40 MG capsule Take 40 mg by mouth every morning.     gabapentin  (NEURONTIN ) 300  MG capsule Take 1 capsule (300 mg total) by mouth 3 (three) times daily. 90 capsule 3   tamoxifen  (NOLVADEX ) 20 MG tablet TAKE 1 TABLET(20 MG) BY MOUTH DAILY 30 tablet 3   traZODone  (DESYREL ) 100 MG tablet Take 100 mg by mouth at bedtime.     trospium (SANCTURA) 20 MG tablet Take 20 mg by mouth at bedtime.     vitamin B-12 (CYANOCOBALAMIN ) 500 MCG tablet Take 500 mcg by mouth daily.     No current facility-administered medications for this visit.    HISTORY OF PRESENT ILLNESS:   Oncology History Overview Note  Cancer Staging Malignant neoplasm of 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, estrogen receptor positive (HCC) Staging form: Breast, AJCC 8th Edition - Clinical stage from 02/16/2018: Stage IB (cT2(m), cN0, cM0, G2, ER+, PR+, HER2-) - Signed by Lanny Callander, MD on 03/11/2018    Malignant neoplasm of 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, estrogen receptor positive (HCC)  01/30/2018 Mammogram   Screening mammogram FINDINGS: In the right breast, possible distortion warrants further evaluation. The patient has had an excisional biopsy of her right breast upper outer quadrant in 2007, however this distortion is either better seen or new from her prior mammogram, and it does not quite match the surgical site as marked by a skin scar marker. In the left breast, no findings suspicious for malignancy.   02/14/2018 Mammogram   Diagnostic mammogram: In the lateral aspect of the right breast, posterior depth there is a prominent area of distortion at the approximate 9 o'clock location. While this is in the region of the excisional biopsy that the patient had in 2007, the distortion has become significantly more prominent as compared to the tomosynthesis mammogram performed in September of 2016, and appears to be inferior to the surgical scar.   02/14/2018 Breast US    Ultrasound of the right breast at 8:30, 4 cm from the nipple demonstrates an irregular hypoechoic vascular mass with  spiculated margins measuring 2.1 x 2.0 x 1.8 cm. There is a small adjacent mass at 9 o'clock, 4 cm from the nipple, which is 1.2 cm away from the dominant mass. The smaller mass measures 1.1 x 0.7 x 0.7 cm. Together, the 2 masses span 3.6 cm of tissue. Ultrasound of the right axilla demonstrates multiple normal-appearing lymph nodes.   IMPRESSION: 1. There is a highly suspicious mass in the right breast at 830 with a small possible satellite lesion at 9 o'clock. 2.  No evidence of right axillary lymphadenopathy.   02/16/2018 Cancer Staging   Staging form: Breast, AJCC 8th Edition - Clinical stage from 02/16/2018: Stage IB (cT2(m), cN0, cM0, G2, ER+, PR+, HER2-) - Signed by Lanny Callander, MD on 03/11/2018   02/16/2018 Initial Biopsy   Diagnosis 1. Breast, right, needle core biopsy, 8:30 o'clock - INVASIVE DUCTAL CARCINOMA, SEE COMMENT. 2. Breast, right, needle core biopsy, 9 o'clock - INVASIVE DUCTAL CARCINOMA. - LOBULAR NEOPLASIA (ATYPICAL LOBULAR HYPERPLASIA).  ER: 95%, positive, strong staining PR: 80%, positive, moderate staining  Proliferation Marker Ki67: 2%  HER2 - Negative Ratio of HER2/CEP17 signals: 1.80 Average HER2 copy number per cell: 2.97    03/01/2018 Imaging   MR Bilateral breast  IMPRESSION: Two areas of known malignancy in the LATERAL portion of the RIGHT breast. No additional areas of concern in either breast. No axillary adenopathy.   03/11/2018 Initial Diagnosis   Malignant neoplasm of 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, estrogen receptor positive (HCC)   04/05/2018 Surgery   RIGHT BREAST SEED BRACKETED LUMPECTOMY WITH SENTINEL LYMPH NODE BIOPSY by Dr. Aron 04/05/18   04/05/2018 Pathology Results   Surgery  Diagnosis 04/05/18  1. Breast, lumpectomy, right - INVASIVE DUCTAL CARCINOMA GRADE I/III, TWO FOCI SPANNING 2.9 CM AND 0.9 CM. - LOBULAR NEOPLASIA (LOBULAR CARCINOMA IN SITU). - DUCTAL CARCINOMA IN SITU, LOW GRADE. - LYMPHOVASCULAR INVASION IS  IDENTIFIED. - THE SURGICAL RESECTION MARGINS ARE NEGATIVE FOR DUCTAL CARCINOMA. - SEE ONCOLOGY TABLE BELOW. 2. Lymph node, sentinel, biopsy, right - THERE IS NO EVIDENCE OF CARCINOMA IN 1 OF 1 LYMPH NODE (0/1). 3. Lymph node, sentinel, biopsy, right - METASTATIC CARCINOMA IN 1 OF 1 LYMPH NODE (1/1) WITH EXTRACAPSULAR EXTENSION. - EXTRACAPSULAR EXTENSION EXTENDS 0.3 CM. 4. Lymph node, sentinel, biopsy, right - DUCTAL CARCINOMA. - PERINEURAL INVASION IS IDENTIFIED. - SEE COMMENT. 5. Lymph node, sentinel, biopsy, right 1 of 4   04/05/2018 Miscellaneous   Mammaprint Low Risk with 10% risk of recurrance with Tamoxifen  alone  MPI at +0.240 DMFI at 97.8%   04/12/2018 Cancer Staging   Staging form: Breast, AJCC 8th Edition - Pathologic: Stage IA (pT2, pN1a, cM0, G1, ER+, PR+, HER2-) - Signed by Lanny Callander, MD on 04/12/2018   04/13/2018 Imaging   Whole Body Bone scan 04/13/18 IMPRESSION: No scintigraphic evidence of osseous metastatic disease.   04/16/2018 Imaging   CT CAP W Contrast 04/16/18  IMPRESSION: 1. No definite findings of metastatic disease in the chest. Solitary solid 4 mm right lower lobe pulmonary nodule, for which initial chest CT follow-up is advised in 3 months. 2. Postsurgical changes from right breast lumpectomy and right axillary node dissection. Thin-walled fluid collections in the right breast and right axilla are most compatible with postsurgical seromas. 3. No evidence of metastatic disease in the abdomen or pelvis.    04/17/2018 Surgery   AXILLARY LYMPH NODE DISSECTION by Dr. Aron 04/17/18     04/17/2018 Pathology Results   Diagnosis 04/17/18  Lymph nodes, regional resection, Right Axillary - BENIGN FIBROADIPOSE AND BREAST TISSUE WITH FAT NECROSIS. - NO LYMPHOID TISSUE PRESENT. - NO MALIGNANCY IDENTIFIED. Microscopic Comment The entire specimen was submitted.   05/28/2018 - 07/10/2018 Radiation Therapy   Radiation treatment dates:   05/28/2018-07/10/2018    Site/dose:    1. Right breast, 2 Gy in 25 fractions for a total dose of 50 Gy 2. Right SCV, 2 Gy in 25 fractions for a total dose of 50 Gy 3. Right breast boost, 2 Gy in 5 fractions for a total dose of 5 Gy           07/24/2018 -  Anti-estrogen oral therapy   Letrozole  2.5mg  daily starting 07/24/18    Survivorship   Per Lacie Burton, NP        REVIEW OF SYSTEMS:   Constitutional: Denies fevers, chills or abnormal weight loss Eyes: Denies blurriness of vision Ears, nose, mouth, throat, and face: Denies mucositis or sore throat Respiratory: Denies cough, dyspnea or wheezes Cardiovascular: Denies palpitation, chest discomfort or lower extremity swelling Gastrointestinal:  Denies nausea, heartburn or change in bowel habits Skin: Denies abnormal skin rashes Lymphatics: Denies new lymphadenopathy or easy bruising Neurological:Denies numbness, tingling or new weaknesses Behavioral/Psych: Mood is  stable, no new changes  All other systems were reviewed with the patient and are negative.   VITALS:   Today's Vitals   07/25/24 1353  BP: 112/63  Pulse: 75  Resp: 17  Temp: (!) 97.4 F (36.3 C)  SpO2: 100%  Height: 5' 1 (1.549 m)   Body mass index is 39.86 kg/m.   Wt Readings from Last 3 Encounters:  07/29/24 202 lb 3.2 oz (91.7 kg)  04/22/24 210 lb 15.7 oz (95.7 kg)  04/10/24 211 lb (95.7 kg)    Body mass index is 39.86 kg/m.  Performance status (ECOG): 0 - Asymptomatic  PHYSICAL EXAM:   GENERAL:alert, no distress and comfortable SKIN: skin color, texture, turgor are normal, no rashes or significant lesions EYES: normal, Conjunctiva are pink and non-injected, sclera clear OROPHARYNX:no exudate, no erythema and lips, buccal mucosa, and tongue normal  NECK: supple, thyroid normal size, non-tender, without nodularity LYMPH:  no palpable lymphadenopathy in the cervical, axillary or inguinal LUNGS: clear to auscultation and percussion with normal breathing effort HEART:  regular rate & rhythm and no murmurs and no lower extremity edema ABDOMEN:abdomen soft, non-tender and normal bowel sounds Musculoskeletal:no cyanosis of digits and no clubbing  NEURO: alert & oriented x 3 with fluent speech, no focal motor/sensory deficits BREAST: There is well-healed lumpectomy scar on the right breast.  Expected skin changes related to radiation are noted.  No palpable lumps or masses present in the right breast.  There is no nipple inversion or nipple discharge.  There is no axillary lymphadenopathy on the right.  The left breast is without lumps or masses.  There is no nipple inversion or nipple discharge.  There is no axillary lymphadenopathy on the left side.  LABORATORY DATA:  I have reviewed the data as listed    Component Value Date/Time   NA 143 07/25/2024 1326   K 4.3 07/25/2024 1326   CL 109 07/25/2024 1326   CO2 27 07/25/2024 1326   GLUCOSE 114 (H) 07/25/2024 1326   BUN 20 07/25/2024 1326   CREATININE 1.21 (H) 07/25/2024 1326   CALCIUM 9.6 07/25/2024 1326   PROT 7.2 07/25/2024 1326   ALBUMIN  3.9 07/25/2024 1326   AST 20 07/25/2024 1326   ALT 10 07/25/2024 1326   ALKPHOS 74 07/25/2024 1326   BILITOT 0.3 07/25/2024 1326   GFRNONAA 47 (L) 07/25/2024 1326   GFRAA >60 11/29/2019 1407    Lab Results  Component Value Date   WBC 3.3 (L) 07/25/2024   NEUTROABS 1.6 (L) 07/25/2024   HGB 11.9 (L) 07/25/2024   HCT 37.5 07/25/2024   MCV 91.9 07/25/2024   PLT 229 07/25/2024

## 2024-07-24 NOTE — Assessment & Plan Note (Addendum)
 invasive ductal carcinoma, multi-focal, stage IA (pmT2N1aM0), ER+/PR+/HER2-, G1  -diagnosed 01/2018, s/p right lumpectomy and ALND, and adjuvant RT. oncotype showed low risk, adjuvant chemo was not recommended.  -began adjuvant AI with letrozole  in 07/2018, tolerating well with stable joint pain.  She switched to tamoxifen  11/2021 due to osteoporosis.  Goal of therapy is a total of 5-10 years -Amy Roman is clinically doing well.  Tolerating tamoxifen .  Breast exam is benign, labs are stable.  I encouraged her to hydrate.  No clinical concern for breast cancer recurrence.  Mammogram is scheduled for later today, will follow  -She continues to tolerate tamoxifen  with minimal and manageable side effects. -Screening mammogram from 08/22/2023 was negative.  Annual screening mammogram to be scheduled after 08/22/2024. - Continue to follow-up anually

## 2024-07-25 ENCOUNTER — Inpatient Hospital Stay: Payer: 59 | Attending: Nurse Practitioner

## 2024-07-25 ENCOUNTER — Inpatient Hospital Stay (HOSPITAL_BASED_OUTPATIENT_CLINIC_OR_DEPARTMENT_OTHER): Payer: 59 | Admitting: Nurse Practitioner

## 2024-07-25 VITALS — BP 112/63 | HR 75 | Temp 97.4°F | Resp 17 | Ht 61.0 in

## 2024-07-25 DIAGNOSIS — Z79899 Other long term (current) drug therapy: Secondary | ICD-10-CM | POA: Diagnosis not present

## 2024-07-25 DIAGNOSIS — C50511 Malignant neoplasm of lower-outer quadrant of right female breast: Secondary | ICD-10-CM

## 2024-07-25 DIAGNOSIS — Z170 Estrogen receptor positive status [ER+]: Secondary | ICD-10-CM | POA: Diagnosis not present

## 2024-07-25 DIAGNOSIS — Z79810 Long term (current) use of selective estrogen receptor modulators (SERMs): Secondary | ICD-10-CM | POA: Diagnosis not present

## 2024-07-25 DIAGNOSIS — Z923 Personal history of irradiation: Secondary | ICD-10-CM | POA: Insufficient documentation

## 2024-07-25 DIAGNOSIS — Z1721 Progesterone receptor positive status: Secondary | ICD-10-CM | POA: Insufficient documentation

## 2024-07-25 DIAGNOSIS — M810 Age-related osteoporosis without current pathological fracture: Secondary | ICD-10-CM | POA: Insufficient documentation

## 2024-07-25 DIAGNOSIS — Z17411 Hormone receptor positive with human epidermal growth factor receptor 2 negative status: Secondary | ICD-10-CM | POA: Diagnosis not present

## 2024-07-25 LAB — CBC WITH DIFFERENTIAL (CANCER CENTER ONLY)
Abs Immature Granulocytes: 0 K/uL (ref 0.00–0.07)
Basophils Absolute: 0 K/uL (ref 0.0–0.1)
Basophils Relative: 1 %
Eosinophils Absolute: 0.1 K/uL (ref 0.0–0.5)
Eosinophils Relative: 3 %
HCT: 37.5 % (ref 36.0–46.0)
Hemoglobin: 11.9 g/dL — ABNORMAL LOW (ref 12.0–15.0)
Immature Granulocytes: 0 %
Lymphocytes Relative: 35 %
Lymphs Abs: 1.1 K/uL (ref 0.7–4.0)
MCH: 29.2 pg (ref 26.0–34.0)
MCHC: 31.7 g/dL (ref 30.0–36.0)
MCV: 91.9 fL (ref 80.0–100.0)
Monocytes Absolute: 0.4 K/uL (ref 0.1–1.0)
Monocytes Relative: 13 %
Neutro Abs: 1.6 K/uL — ABNORMAL LOW (ref 1.7–7.7)
Neutrophils Relative %: 48 %
Platelet Count: 229 K/uL (ref 150–400)
RBC: 4.08 MIL/uL (ref 3.87–5.11)
RDW: 14.2 % (ref 11.5–15.5)
WBC Count: 3.3 K/uL — ABNORMAL LOW (ref 4.0–10.5)
nRBC: 0 % (ref 0.0–0.2)

## 2024-07-25 LAB — CMP (CANCER CENTER ONLY)
ALT: 10 U/L (ref 0–44)
AST: 20 U/L (ref 15–41)
Albumin: 3.9 g/dL (ref 3.5–5.0)
Alkaline Phosphatase: 74 U/L (ref 38–126)
Anion gap: 7 (ref 5–15)
BUN: 20 mg/dL (ref 8–23)
CO2: 27 mmol/L (ref 22–32)
Calcium: 9.6 mg/dL (ref 8.9–10.3)
Chloride: 109 mmol/L (ref 98–111)
Creatinine: 1.21 mg/dL — ABNORMAL HIGH (ref 0.44–1.00)
GFR, Estimated: 47 mL/min — ABNORMAL LOW (ref >60)
Glucose, Bld: 114 mg/dL — ABNORMAL HIGH (ref 70–99)
Potassium: 4.3 mmol/L (ref 3.5–5.1)
Sodium: 143 mmol/L (ref 135–145)
Total Bilirubin: 0.3 mg/dL (ref 0.0–1.2)
Total Protein: 7.2 g/dL (ref 6.5–8.1)

## 2024-07-26 ENCOUNTER — Telehealth: Payer: Self-pay | Admitting: Nurse Practitioner

## 2024-07-26 ENCOUNTER — Ambulatory Visit

## 2024-07-26 NOTE — Telephone Encounter (Signed)
 I spoke with Amy Roman's son and informed him that his Mom's yearly follow up has been scheduled on 07/25/2025.

## 2024-07-29 ENCOUNTER — Encounter (HOSPITAL_BASED_OUTPATIENT_CLINIC_OR_DEPARTMENT_OTHER): Payer: Self-pay | Admitting: Family Medicine

## 2024-07-29 ENCOUNTER — Ambulatory Visit (INDEPENDENT_AMBULATORY_CARE_PROVIDER_SITE_OTHER): Admitting: Family Medicine

## 2024-07-29 VITALS — BP 134/66 | HR 62 | Ht 61.0 in | Wt 202.2 lb

## 2024-07-29 DIAGNOSIS — F32A Depression, unspecified: Secondary | ICD-10-CM | POA: Diagnosis not present

## 2024-07-29 DIAGNOSIS — F419 Anxiety disorder, unspecified: Secondary | ICD-10-CM | POA: Diagnosis not present

## 2024-07-29 NOTE — Patient Instructions (Signed)

## 2024-07-29 NOTE — Progress Notes (Signed)
 New Patient Office Visit  Subjective   Patient ID: Amy Roman, female    DOB: 1951-02-24  Age: 73 y.o. MRN: [id number]  CC:  Chief Complaint  Patient presents with   New Patient (Initial Visit)    New Patient - no issues just establishing care     HPI Amy Roman presents to establish care Last PCP - Dr. Arthur Reese  GERD: takes famotidine  as needed  Depression: currently taking fluoxetine  and trazodone  with reported good control of symptoms. Did take another medication in the past, but does not recall.  History of neuropathy: taking gabapentin  and amitriptyline  for symptom control.  History of breast cancer: sees oncology, taking tamoxifen  currently  Patient is originally from [place]. Has lived here for about 26 years.  Outpatient Encounter Medications as of 07/29/2024  Medication Sig   amitriptyline  (ELAVIL ) 25 MG tablet TAKE 2 TABLETS(50 MG) BY MOUTH AT BEDTIME. MAY INCREASE TO 75 MG AT BEDTIME AFTER 2 WEEKS   b complex vitamins tablet Take 1 tablet by mouth daily.   famotidine  (PEPCID ) 20 MG tablet Take 20 mg by mouth daily as needed for heartburn or indigestion.   FLUoxetine  (PROZAC ) 40 MG capsule Take 40 mg by mouth every morning.   gabapentin  (NEURONTIN ) 300 MG capsule Take 1 capsule (300 mg total) by mouth 3 (three) times daily.   tamoxifen  (NOLVADEX ) 20 MG tablet TAKE 1 TABLET(20 MG) BY MOUTH DAILY   traZODone  (DESYREL ) 100 MG tablet Take 100 mg by mouth at bedtime.   trospium (SANCTURA) 20 MG tablet Take 20 mg by mouth at bedtime.   vitamin B-12 (CYANOCOBALAMIN ) 500 MCG tablet Take 500 mcg by mouth daily.   [DISCONTINUED] ferrous sulfate 325 (65 FE) MG EC tablet Take 325 mg by mouth 3 (three) times daily with meals. (Patient not taking: Reported on 04/10/2024)   [DISCONTINUED] ondansetron  (ZOFRAN ) 4 MG tablet Take 1 tablet (4 mg total) by mouth every 8 (eight) hours as needed for nausea or vomiting. (Patient not taking: Reported on 07/25/2024)   No  facility-administered encounter medications on file as of 07/29/2024.    Past Medical History:  Diagnosis Date   Anemia    Anxiety and depression 10/15/2014   Arthritis    BILATERAL KNEES, HAD CORTISONE INJECTION 03/2017   Breast cancer (HCC) 2019   Right Breast Cancer   Depression    GERD (gastroesophageal reflux disease)    OCCASIONAL WITH CERTAIN FOOD   Headache    patient denies this dx - occasional HA   Heart murmur 2021   pt denies this dx; in 2021, no mumur noted by Dr Patrisha   History of blood transfusion 02/2018   History of radiation therapy 05/28/18- 07/10/18   Right Breast 25 fractions for a total dose of 50 Gy, Right SCV and PAB 25 fractions for a total dose of 50 Gy, right breast boost, 5 fractions for a total dose of 10 Gy.    Personal history of radiation therapy    Right Breast Cancer   Seasonal allergies    SVD (spontaneous vaginal delivery)    x 4   Unspecified hereditary and idiopathic peripheral neuropathy 05/14/2014   LEFT KNEE AND FOOT/HANDS    Past Surgical History:  Procedure Laterality Date   ABDOMINAL HYSTERECTOMY     AXILLARY LYMPH NODE DISSECTION Right 04/17/2018   Procedure: AXILLARY LYMPH NODE DISSECTION;  Surgeon: Aron Shoulders, MD;  Location: [facility];  Service: General;  Laterality: Right;  BREAST EXCISIONAL BIOPSY Right 2007   BREAST LUMPECTOMY Right 2019   BREAST LUMPECTOMY WITH RADIOACTIVE SEED AND SENTINEL LYMPH NODE BIOPSY Right 04/05/2018   Procedure: RIGHT BREAST SEED BRACKETED LUMPECTOMY WITH SENTINEL LYMPH NODE BIOPSY;  Surgeon: Aron Shoulders, MD;  Location: [facility];  Service: General;  Laterality: Right;   BREAST SURGERY     bx, no cancer    CATARACT EXTRACTION Bilateral    COLONOSCOPY  09/2018   DILATATION & CURETTAGE/HYSTEROSCOPY WITH MYOSURE N/A 08/09/2017   Procedure: DILATATION & CURETTAGE/HYSTEROSCOPY WITH MYOSURE;  Surgeon: Starla Harland BROCKS, MD;  Location: [facility];  Service: Gynecology;   Laterality: N/A;   LABIOPLASTY  03/06/2018   Procedure: LABIAPLASTY;  Surgeon: Starla Harland BROCKS, MD;  Location: [facility];  Service: Gynecology;;   TOTAL KNEE ARTHROPLASTY Right 09/14/2020   Procedure: RIGHT TOTAL KNEE ARTHROPLASTY;  Surgeon: Jerri Kay HERO, MD;  Location: [facility];  Service: Orthopedics;  Laterality: Right;   TOTAL KNEE ARTHROPLASTY Left 04/22/2024   Procedure: ARTHROPLASTY, KNEE, TOTAL;  Surgeon: Jerri Kay HERO, MD;  Location: [facility];  Service: Orthopedics;  Laterality: Left;   UPPER GI ENDOSCOPY  09/2018   VAGINAL HYSTERECTOMY Bilateral 03/06/2018   Procedure: HYSTERECTOMY VAGINAL WITH BILATERAL SALPINGOOPHORECTOMY;  Surgeon: Starla Harland BROCKS, MD;  Location: [facility];  Service: Gynecology;  Laterality: Bilateral;    Family History  Problem Relation Age of Onset   Diabetes Mother    Neuropathy Mother    Other Father    Neuropathy Sister    Cancer Sister        unknown type cancer in neck    Cancer Maternal Aunt        leukemia    Colon cancer Neg Hx    Breast cancer Neg Hx     Social History   Socioeconomic History   Marital status: Widowed    Spouse name: Not on file   Number of children: 4   Years of education: Not on file   Highest education level: Not on file  Occupational History   Occupation: house   Tobacco Use   Smoking status: Never    Passive exposure: Never   Smokeless tobacco: Never  Vaping Use   Vaping status: Never Used  Substance and Sexual Activity   Alcohol use: No   Drug use: No   Sexual activity: Not Currently    Birth control/protection: Surgical    Comment: Hysterectomy  Other Topics Concern   Not on file  Social History Narrative   Original from [place]   Moved to [place]  2000   Widow    She lives with son.  She had four sons.         Social Drivers of Corporate investment banker Strain: Not on file  Food Insecurity: No Food Insecurity (04/23/2024)   Hunger Vital Sign    Worried About Running Out of Food in the Last Year: Never  true    Ran Out of Food in the Last Year: Never true  Transportation Needs: No Transportation Needs (04/23/2024)   PRAPARE - Administrator, Civil Service (Medical): No    Lack of Transportation (Non-Medical): No  Physical Activity: Not on file  Stress: Not on file  Social Connections: Moderately Integrated (04/23/2024)   Social Connection and Isolation Panel    Frequency of Communication with Friends and Family: Three times a week    Frequency of Social Gatherings with Friends and Family: Three times a  week    Attends Religious Services: 1 to 4 times per year    Active Member of Clubs or Organizations: Yes    Attends Banker Meetings: 1 to 4 times per year    Marital Status: Widowed  Intimate Partner Violence: Not At Risk (04/23/2024)   Humiliation, Afraid, Rape, and Kick questionnaire    Fear of Current or Ex-Partner: No    Emotionally Abused: No    Physically Abused: No    Sexually Abused: No    Objective   BP 134/66 (BP Location: Left Arm, Patient Position: Sitting, Cuff Size: Large)   Pulse 62   Ht 5' 1 (1.549 m)   Wt 202 lb 3.2 oz (91.7 kg)   SpO2 100%   BMI 38.21 kg/m   Physical Exam  73 year old female in no acute distress Cardiovascular exam with regular rate and rhythm Lungs clear to auscultation bilaterally  Assessment & Plan:   Anxiety and depression Assessment & Plan: Patient reports that symptoms are appropriately controlled with current regimen, can continue with fluoxetine  and trazodone .  She does not need refill today.   Recommend continuing follow-up with specialist related to history of breast cancer and neuropathy. Chart notes history of vitamin B12 and vitamin D  deficiency.  We can monitor these on future labs.  Return in about 4 months (around 11/29/2024).    ___________________________________________ Deidra Spease de [place], MD, ABFM, CAQSM Primary Care and Sports Medicine [facility]

## 2024-07-29 NOTE — Assessment & Plan Note (Signed)
 Patient reports that symptoms are appropriately controlled with current regimen, can continue with fluoxetine  and trazodone .  She does not need refill today.

## 2024-08-06 ENCOUNTER — Ambulatory Visit

## 2024-08-08 ENCOUNTER — Encounter

## 2024-08-11 ENCOUNTER — Encounter: Payer: Self-pay | Admitting: Nurse Practitioner

## 2024-08-11 NOTE — Addendum Note (Signed)
 Addended by: HANFORD MOATS on: 08/11/2024 08:27 PM   Modules accepted: Orders

## 2024-08-26 ENCOUNTER — Encounter: Payer: Self-pay | Admitting: Radiology

## 2024-09-16 ENCOUNTER — Other Ambulatory Visit: Payer: Self-pay | Admitting: Hematology

## 2024-09-17 ENCOUNTER — Telehealth: Payer: Self-pay

## 2024-09-17 NOTE — Telephone Encounter (Signed)
 Pt's son called requesting a refill of the pt's Tamoxifen .  Refill 90day supply was sent in on 09/16/2024. Spoke with pt's son to inform pt that the refill has been sent to pt's preferred pharmacy on file.

## 2024-10-23 ENCOUNTER — Ambulatory Visit: Admitting: Physician Assistant

## 2024-12-02 ENCOUNTER — Ambulatory Visit (HOSPITAL_BASED_OUTPATIENT_CLINIC_OR_DEPARTMENT_OTHER): Admitting: Family Medicine

## 2025-07-25 ENCOUNTER — Other Ambulatory Visit

## 2025-07-25 ENCOUNTER — Ambulatory Visit: Admitting: Nurse Practitioner
